# Patient Record
Sex: Female | Born: 1945 | Race: White | Hispanic: No | Smoking: Former smoker
Health system: Southern US, Community
[De-identification: ages and names within clinical notes are randomized; demographics above are authoritative.]

## PROBLEM LIST (undated history)

## (undated) DIAGNOSIS — R05 Cough: Secondary | ICD-10-CM

## (undated) DIAGNOSIS — J479 Bronchiectasis, uncomplicated: Secondary | ICD-10-CM

## (undated) DIAGNOSIS — R059 Cough, unspecified: Secondary | ICD-10-CM

## (undated) DIAGNOSIS — J309 Allergic rhinitis, unspecified: Secondary | ICD-10-CM

## (undated) DIAGNOSIS — J329 Chronic sinusitis, unspecified: Secondary | ICD-10-CM

## (undated) HISTORY — PX: APPENDECTOMY: SHX54

## (undated) HISTORY — DX: Allergic rhinitis, unspecified: J30.9

## (undated) HISTORY — PX: CHOLECYSTECTOMY: SHX55

## (undated) HISTORY — PX: TUBAL LIGATION: SHX77

## (undated) HISTORY — DX: Cough, unspecified: R05.9

## (undated) HISTORY — PX: VESICOVAGINAL FISTULA CLOSURE W/ TAH: SUR271

## (undated) HISTORY — DX: Cough: R05

## (undated) HISTORY — DX: Bronchiectasis, uncomplicated: J47.9

## (undated) HISTORY — DX: Chronic sinusitis, unspecified: J32.9

---

## 1998-03-26 ENCOUNTER — Other Ambulatory Visit: Admission: RE | Admit: 1998-03-26 | Discharge: 1998-03-26 | Payer: Self-pay | Admitting: Gynecology

## 2000-07-05 ENCOUNTER — Other Ambulatory Visit: Admission: RE | Admit: 2000-07-05 | Discharge: 2000-07-05 | Payer: Self-pay | Admitting: Gynecology

## 2002-12-21 ENCOUNTER — Other Ambulatory Visit: Admission: RE | Admit: 2002-12-21 | Discharge: 2002-12-21 | Payer: Self-pay | Admitting: Gynecology

## 2003-03-22 ENCOUNTER — Other Ambulatory Visit: Admission: RE | Admit: 2003-03-22 | Discharge: 2003-03-22 | Payer: Self-pay | Admitting: Gynecology

## 2003-10-02 ENCOUNTER — Other Ambulatory Visit: Admission: RE | Admit: 2003-10-02 | Discharge: 2003-10-02 | Payer: Self-pay | Admitting: Gynecology

## 2003-12-25 ENCOUNTER — Other Ambulatory Visit: Admission: RE | Admit: 2003-12-25 | Discharge: 2003-12-25 | Payer: Self-pay | Admitting: Gynecology

## 2004-06-26 ENCOUNTER — Other Ambulatory Visit: Admission: RE | Admit: 2004-06-26 | Discharge: 2004-06-26 | Payer: Self-pay | Admitting: Gynecology

## 2007-01-04 ENCOUNTER — Ambulatory Visit (HOSPITAL_COMMUNITY): Admission: RE | Admit: 2007-01-04 | Discharge: 2007-01-05 | Payer: Self-pay | Admitting: Obstetrics and Gynecology

## 2009-05-03 ENCOUNTER — Ambulatory Visit: Payer: Self-pay | Admitting: Pulmonary Disease

## 2009-05-03 DIAGNOSIS — R05 Cough: Secondary | ICD-10-CM

## 2009-05-03 DIAGNOSIS — R930 Abnormal findings on diagnostic imaging of skull and head, not elsewhere classified: Secondary | ICD-10-CM

## 2009-05-03 DIAGNOSIS — J309 Allergic rhinitis, unspecified: Secondary | ICD-10-CM

## 2009-05-16 ENCOUNTER — Telehealth: Payer: Self-pay | Admitting: Pulmonary Disease

## 2009-05-23 ENCOUNTER — Ambulatory Visit: Payer: Self-pay | Admitting: Pulmonary Disease

## 2009-05-23 DIAGNOSIS — J479 Bronchiectasis, uncomplicated: Secondary | ICD-10-CM | POA: Insufficient documentation

## 2009-06-10 ENCOUNTER — Telehealth: Payer: Self-pay | Admitting: Pulmonary Disease

## 2009-07-03 ENCOUNTER — Telehealth: Payer: Self-pay | Admitting: Pulmonary Disease

## 2009-07-08 ENCOUNTER — Ambulatory Visit: Payer: Self-pay | Admitting: Pulmonary Disease

## 2009-07-09 ENCOUNTER — Encounter: Payer: Self-pay | Admitting: Pulmonary Disease

## 2009-07-10 ENCOUNTER — Telehealth (INDEPENDENT_AMBULATORY_CARE_PROVIDER_SITE_OTHER): Payer: Self-pay | Admitting: *Deleted

## 2009-07-15 ENCOUNTER — Telehealth (INDEPENDENT_AMBULATORY_CARE_PROVIDER_SITE_OTHER): Payer: Self-pay | Admitting: *Deleted

## 2009-07-17 ENCOUNTER — Ambulatory Visit: Payer: Self-pay | Admitting: Pulmonary Disease

## 2009-07-26 ENCOUNTER — Telehealth (INDEPENDENT_AMBULATORY_CARE_PROVIDER_SITE_OTHER): Payer: Self-pay | Admitting: *Deleted

## 2009-08-02 ENCOUNTER — Telehealth: Payer: Self-pay | Admitting: Pulmonary Disease

## 2009-08-06 ENCOUNTER — Ambulatory Visit: Payer: Self-pay | Admitting: Internal Medicine

## 2009-08-08 ENCOUNTER — Ambulatory Visit: Payer: Self-pay | Admitting: Pulmonary Disease

## 2009-08-09 ENCOUNTER — Telehealth: Payer: Self-pay | Admitting: Pulmonary Disease

## 2009-08-16 ENCOUNTER — Telehealth (INDEPENDENT_AMBULATORY_CARE_PROVIDER_SITE_OTHER): Payer: Self-pay | Admitting: *Deleted

## 2009-09-09 ENCOUNTER — Ambulatory Visit: Payer: Self-pay | Admitting: Pulmonary Disease

## 2009-10-25 ENCOUNTER — Encounter: Payer: Self-pay | Admitting: Pulmonary Disease

## 2009-12-20 ENCOUNTER — Ambulatory Visit: Payer: Self-pay | Admitting: Pulmonary Disease

## 2010-01-21 ENCOUNTER — Ambulatory Visit: Payer: Self-pay | Admitting: Pulmonary Disease

## 2010-01-21 ENCOUNTER — Telehealth (INDEPENDENT_AMBULATORY_CARE_PROVIDER_SITE_OTHER): Payer: Self-pay | Admitting: *Deleted

## 2010-01-21 DIAGNOSIS — J019 Acute sinusitis, unspecified: Secondary | ICD-10-CM

## 2010-01-22 ENCOUNTER — Ambulatory Visit: Payer: Self-pay | Admitting: Cardiology

## 2010-06-18 ENCOUNTER — Ambulatory Visit: Payer: Self-pay | Admitting: Pulmonary Disease

## 2010-11-10 ENCOUNTER — Telehealth (INDEPENDENT_AMBULATORY_CARE_PROVIDER_SITE_OTHER): Payer: Self-pay | Admitting: *Deleted

## 2010-11-10 ENCOUNTER — Ambulatory Visit: Payer: Self-pay | Admitting: Pulmonary Disease

## 2010-11-10 ENCOUNTER — Telehealth: Payer: Self-pay | Admitting: Pulmonary Disease

## 2010-11-24 ENCOUNTER — Ambulatory Visit: Payer: Self-pay | Admitting: Pulmonary Disease

## 2010-11-24 DIAGNOSIS — R042 Hemoptysis: Secondary | ICD-10-CM | POA: Insufficient documentation

## 2010-12-01 ENCOUNTER — Ambulatory Visit: Payer: Self-pay | Admitting: Internal Medicine

## 2010-12-01 ENCOUNTER — Encounter: Payer: Self-pay | Admitting: Pulmonary Disease

## 2010-12-04 ENCOUNTER — Encounter: Payer: Self-pay | Admitting: Pulmonary Disease

## 2010-12-17 ENCOUNTER — Ambulatory Visit
Admission: RE | Admit: 2010-12-17 | Discharge: 2010-12-17 | Payer: Self-pay | Source: Home / Self Care | Attending: Pulmonary Disease | Admitting: Pulmonary Disease

## 2011-01-12 ENCOUNTER — Telehealth: Payer: Self-pay | Admitting: Pulmonary Disease

## 2011-01-13 NOTE — Progress Notes (Signed)
Summary: call in cipro now please  Phone Note Call from Patient   Caller: Patient Call For: clance Summary of Call: pt "just" saw kc. she wants [facility] [phone] as[facility] so it will be ready for pick up. (pt is at [facility] right now) Initial call taken by: Tivis Ringer, CNA,  January 21, 2010 10:50 AM  Follow-up for Phone Call        rx called into pharmacy.  Pt aware.  Aundra Millet Reynolds LPN  January 21, 2010 11:05 AM

## 2011-01-13 NOTE — Assessment & Plan Note (Signed)
Summary: rov for bronchiectasis/mac colonization   Primary Provider/Referring Provider:  Merrily Pew ([place])  CC:  6 month followup on bronchiectasis with CXR. Pt states that overall she has been doing well.  She has noticed some increased cough x 1 wk with white sputum.  She relates this to the humidity.  She states that today she was exposed to fumes in a new home and felt out of breath..  History of Present Illness: The pt comes in today for f/u of her known bronchiectasis.  She was last seen in Feb of this year for an episode of acute sinusitis, verified by ct scan.  She was treated with abx, and referred to ENT for evaluation.  She did well with the abx treatment, and although I have not received the ENT note, she tells me her exam was clear after the abx regimen.  Overall, she feels that she is doing well.  Minimal cough, no congestion or mucus of significance, and no sob.    Current Medications (verified): 1)  Estrace 1 Mg Tabs (Estradiol) .... Take 1 Tablet By Mouth Once A Day 2)  Calcium-Vitamin D3 600-200 Mg-Unit Tabs (Calcium Carbonate-Vitamin D) .... Two Times A Day 3)  Flax Seed Oil 1000 Mg Caps (Flaxseed (Linseed)) .... 1300mg  Once Daily 4)  Vitamin C 1000 Mg Tabs (Ascorbic Acid) .... Take 1 Tablet By Mouth Once A Day 5)  Lysine Acetate 500 Mg Tabs (Lysine Acetate) .Marland Kitchen.. 1 Once Daily 6)  Omeprazole 20 Mg Cpdr (Omeprazole) .... Take 1 Capsule By Mouth Two Times A Day 7)  Valacyclovir Hcl 500 Mg Tabs (Valacyclovir Hcl) .... Take 1 Tablet By Mouth Once A Day 8)  Alprazolam 0.25 Mg Tabs (Alprazolam) .... As Needed 9)  Fluorometholone 0.1 % Susp (Fluorometholone) .... As Directed 10)  Vitamin B-12 2500 Mcg Subl (Cyanocobalamin) .... Once Daily 11)  Chromium Picolinate 500 Mcg Tabs (Chromium Picolinate) .... 2 Once Daily  Allergies (verified): No Known Drug Allergies  Review of Systems       The patient complains of non-productive cough.  The patient denies shortness of breath  with activity, coughing up blood, chest pain, irregular heartbeats, acid heartburn, indigestion, loss of appetite, weight change, abdominal pain, difficulty swallowing, sore throat, tooth/dental problems, headaches, nasal congestion/difficulty breathing through nose, sneezing, itching, ear ache, anxiety, depression, hand/feet swelling, joint stiffness or pain, rash, change in color of mucus, fever, shortness of breath at rest, and productive cough.    Vital Signs:  Patient profile:   65 year old female Weight:      137 pounds O2 Sat:      96 % on Room air Temp:     98.1 degrees F oral Pulse rate:   92 / minute BP sitting:   110 / 70  (left arm)  Vitals Entered By: Vernie Murders (June 18, 2010 3:18 PM)  O2 Flow:  Room air  Physical Exam  General:  wd female in nad Nose:  no drainage noted or discharge Lungs:  totally clear to auscultation no wheezing or crackles. Heart:  rrr, no mrg Extremities:  no edema or cyanosis Neurologic:  alert and oriented, moves all 4.   Impression & Recommendations:  Problem # 1:  BRONCHIECTASIS (ICD-494.0) the pt is doing well from a bronchiectasis standpoint.  She has had no recent acute exacerbations, and feels that she is breathing well with adequate exercise tolerance.  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  No new interventions required provided cxr is stable.  Medications Added to Medication List This Visit: 1)  Lysine Acetate 500 Mg Tabs (Lysine acetate) .Marland Kitchen.. 1 once daily 2)  Chromium Picolinate 500 Mcg Tabs (Chromium picolinate) .... 2 once daily  Other Orders: Est. Patient Level III (65784) T-2 View CXR (71020TC)  Patient Instructions: 1)  no change in medications 2)  followup with me in 6mos or sooner if having flareup.  Prescriptions: OMEPRAZOLE 20 MG CPDR (OMEPRAZOLE) Take 1 capsule by mouth two times a day  #180 x 3   Entered and Authorized by:   Barbaraann Share MD   Signed by:   Barbaraann Share MD on 06/18/2010   Method used:   Print then Give to Patient   RxID:   [id number] OMEPRAZOLE 20 MG CPDR (OMEPRAZOLE) Take 1 capsule by mouth two times a day  #60 x 6   Entered and Authorized by:   Barbaraann Share MD   Signed by:   Barbaraann Share MD on 06/18/2010   Method used:   Print then Give to Patient   RxID:   [id number]

## 2011-01-13 NOTE — Progress Notes (Signed)
Summary: appt vs. er  Phone Note Call from Patient Call back at [facility] Phone [phone]   Caller: Patient Call For: clance Reason for Call: Talk to Nurse Summary of Call: Patient calling back saying she does not want to go to the ER.  Pt is requesting that she keep 3:00pm appt. w/ TP. Initial call taken by: Lehman Prom,  November 10, 2010 11:46 AM  Follow-up for Phone Call        Pt is going to keep OV today with TP  @ 3pm for hemoptysis.Michel Bickers [facility]  November 10, 2010 12:06 PM

## 2011-01-13 NOTE — Progress Notes (Signed)
Summary: HEMOPTYSIS  Phone Note Call from Patient Call back at Home Phone [phone]   Caller: Patient Call For: Candee Hoon Summary of Call: Coughing up blood today, states KC told her to call when this happens pls advise.//[facility] [place] Initial call taken by: Darletta Moll,  November 10, 2010 11:02 AM  Follow-up for Phone Call        Called, spoke with pt.  She c/o coughing up "pure red blood" x 30 minutes.  Blood was bright red and liquid.  States with each episode she coughed up approx 1tsp - 1tbsp of blood.  Pt states hemoptysis has stoped.  Denies SOB, wheezing, chest tightness, f/c/s.   While talking with TP's nurse [id number] pt in today, pt called back stating hemoptysis has now restarted and having clots in blood now.  OV offered today at 3pm with TP as there are no openings in office sooner but pt would like to be seen sooner as she does not want to wait that long with hemoptysis.  Pt was advised to go to ER as there are no openings before 3pm -- she verbalized understanding and stated she will go to [facility] ER.  Pt requesting KC to be aware of this -- will forward message to him so he is aware pt going to [facility] ER for further eval. Follow-up by: Gweneth Dimitri RN,  November 10, 2010 11:38 AM  Additional Follow-up for Phone Call Additional follow up Details #1::        noted. Additional Follow-up by: Barbaraann Share MD,  November 10, 2010 12:02 PM

## 2011-01-13 NOTE — Assessment & Plan Note (Signed)
Summary: rov for bronchiectasis   Primary Provider/Referring Provider:  Merrily Pew ([place])  CC:  Pt here for follow up. Pt states was treated with Biaxcin and Cipro in December along with Prednisone taper by Dr. Tomasa Blase. Pt c/o productive cough with "blood" taste.  History of Present Illness: The pt comes in today for f/u of her known bronchiectasis with MAC colonization.  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  Since the last visit, she had been doing well until DEC of last year when she developed a sinus infection.  She was treated with an abx and prednisone, but only had partial relief.  She required treatment a few weeks later with cipro, with good response.  She currently feels that she is breathing well, and has minimal cough with scant mucus.  She feels a little nasal, and that she has postnasal drip.  Overall, cough is not an issue for her.  Current Medications (verified): 1)  Estrace 1 Mg Tabs (Estradiol) .... Take 1 Tablet By Mouth Once A Day 2)  Calcium 600 1500 Mg Tabs (Calcium Carbonate) .... Take 1 Tablet By Mouth Two Times A Day 3)  Flax Seed Oil 1000 Mg Caps (Flaxseed (Linseed)) .... 1300mg  Once Daily 4)  Vitamin C 1000 Mg Tabs (Ascorbic Acid) .... Take 1 Tablet By Mouth Once A Day 5)  L-Lysine 500 Mg Tabs (Lysine) .... Take 1 Tablet By Mouth Two Times A Day 6)  Omeprazole 20 Mg Cpdr (Omeprazole) .... Take 1 Capsule By Mouth Two Times A Day 7)  Valacyclovir Hcl 500 Mg Tabs (Valacyclovir Hcl) .... Take 1 Tablet By Mouth Once A Day 8)  Alprazolam 0.25 Mg Tabs (Alprazolam) .... As Needed 9)  Fluorometholone 0.1 % Susp (Fluorometholone) .... As Directed  Allergies (verified): No Known Drug Allergies  Review of Systems      See HPI  Vital Signs:  Patient profile:   65 year old female Height:      64 inches Weight:      143 pounds O2 Sat:      96 % on Room air Temp:     97.9 degrees F oral Pulse rate:   89 / minute BP sitting:   114 / 68   (left arm) Cuff size:   regular  Vitals Entered By: Zackery Barefoot CMA (December 20, 2009 3:30 PM)  O2 Flow:  Room air CC: Pt here for follow up. Pt states was treated with Biaxcin and Cipro in December along with Prednisone taper by Dr. Tomasa Blase. Pt c/o productive cough with "blood" taste Comments Medications reviewed with patient Zackery Barefoot [facility]  December 20, 2009 3:33 PM    Physical Exam  General:  wd female in nad Nose:  no drainage or purulence noted. Lungs:  totally clear. surprisingly no crackles. Heart:  rrr Extremities:  no edema Neurologic:  alert and oriented, moves all 4.   Impression & Recommendations:  Problem # 1:  BRONCHIECTASIS (ICD-494.0) the pt seems to be doing well from this standpoint.  I suspect her recent flareup was due to sinusitis more so than her bronchiectasis.  She still has a nasal voice and postnasal drip.  I have asked her to try neilmed sinus rinses.  If she continues to have sinus issues, or if she has another acute flare in a short period of time, would check ct sinuses.  Problem # 2:  COUGH (ICD-786.2)  primarily due to LPR, and much improved on  PPI.  Medications Added to Medication List This Visit: 1)  Calcium 600 1500 Mg Tabs (Calcium carbonate) .... Take 1 tablet by mouth two times a day 2)  Flax Seed Oil 1000 Mg Caps (Flaxseed (linseed)) .... 1300mg  once daily 3)  Vitamin C 1000 Mg Tabs (Ascorbic acid) .... Take 1 tablet by mouth once a day 4)  Omeprazole 20 Mg Cpdr (Omeprazole) .... Take 1 capsule by mouth two times a day 5)  Alprazolam 0.25 Mg Tabs (Alprazolam) .... As needed 6)  Fluorometholone 0.1 % Susp (Fluorometholone) .... As directed  Other Orders: Est. Patient Level III (50093)  Patient Instructions: 1)  will try neilmed sinus rinses am and pm whenever you feel "nasally", or getting congested. 2)  stay on medication for reflux. 3)  followup with me in 6mos with cxr next visit.   Prescriptions: OMEPRAZOLE 20 MG  CPDR (OMEPRAZOLE) Take 1 capsule by mouth two times a day  #60 x 6   Entered and Authorized by:   Barbaraann Share MD   Signed by:   Barbaraann Share MD on 12/20/2009   Method used:   Print then Give to Patient   RxID:   [id number]

## 2011-01-13 NOTE — Assessment & Plan Note (Signed)
Summary: acute sick visit for cough, sinusitis   Copy to:    Primary Provider/Referring Provider:  Merrily Pew ([place])  CC:  Acute Visit.  c/o head congestion, productive cough with "off white" mucus, increase SOB with activity, fatigued, and hoarseness x1 wk.  Was seen by PCP on 01/13/10 for these symptoms, and was given azithromycin-finished it 2/5 and still no better. denies fever.Marland Kitchen  History of Present Illness: The pt comes in today for an acute sick visit related to cough and congestion.  She has known bronchiectasis with MAC colonization, as well as mild chronic cough that is more upper airway than anything else.  She gives a 2 week h/o head congestion with postnasal drip and cough productive of white mucus.  She has been hoarse, and notes some increase in doe.  She denies any chest congestion.  She was treated with a zpak by her primary md, and feels that she is no better.  She denies f/c/s.  Current Medications (verified): 1)  Estrace 1 Mg Tabs (Estradiol) .... Take 1 Tablet By Mouth Once A Day 2)  Calcium-Vitamin D3 600-200 Mg-Unit Tabs (Calcium Carbonate-Vitamin D) .... Two Times A Day 3)  Flax Seed Oil 1000 Mg Caps (Flaxseed (Linseed)) .... 1300mg  Once Daily 4)  Vitamin C 1000 Mg Tabs (Ascorbic Acid) .... Take 1 Tablet By Mouth Once A Day 5)  Lysine Hcl 1000 Mg Tabs (Lysine Hcl) .... Once Daily 6)  Omeprazole 20 Mg Cpdr (Omeprazole) .... Take 1 Capsule By Mouth Two Times A Day 7)  Valacyclovir Hcl 500 Mg Tabs (Valacyclovir Hcl) .... Take 1 Tablet By Mouth Once A Day 8)  Alprazolam 0.25 Mg Tabs (Alprazolam) .... As Needed 9)  Fluorometholone 0.1 % Susp (Fluorometholone) .... As Directed 10)  Vitamin B-12 2500 Mcg Subl (Cyanocobalamin) .... Once Daily  Allergies (verified): No Known Drug Allergies  Review of Systems       The patient complains of shortness of breath with activity, productive cough, non-productive cough, sore throat, headaches, nasal congestion/difficulty  breathing through nose, and change in color of mucus.  The patient denies shortness of breath at rest, coughing up blood, chest pain, irregular heartbeats, acid heartburn, indigestion, loss of appetite, weight change, abdominal pain, difficulty swallowing, tooth/dental problems, sneezing, itching, ear ache, anxiety, depression, hand/feet swelling, joint stiffness or pain, rash, and fever.    Vital Signs:  Patient profile:   65 year old female Height:      64 inches Weight:      140 pounds BMI:     24.12 O2 Sat:      94 % on Room air Temp:     98.1 degrees F oral Pulse rate:   77 / minute BP sitting:   112 / 70  (left arm) Cuff size:   regular  Vitals Entered By: Gweneth Dimitri RN (January 21, 2010 10:20 AM)  O2 Flow:  Room air CC: Acute Visit.  c/o head congestion, productive cough with "off white" mucus, increase SOB with activity, fatigued, and hoarseness x1 wk.  Was seen by PCP on 01/13/10 for these symptoms, was given azithromycin-finished it 2/5 and still no better. denies fever. Comments Medications reviewed with patient Daytime contact number verified with patient. Crystal Jones RN  January 21, 2010 10:20 AM    Physical Exam  General:  wd female in nad Nose:  mild erythema, no purulence noted Mouth:  clear Lungs:  totally clear to ascultation. Heart:  rrr Extremities:  no edema or cyanosis  Neurologic:  alert and oriented, moves all 4.   Impression & Recommendations:  Problem # 1:  SINUSITIS, ACUTE (ICD-461.9) The pt's symptoms are most c/w sinusitis rather than a lower airway infection.  I am concerned she may have chronic disease given the recurrent nature of her symptoms, and requirement for multiple rounds of abx in order to improve.  She has underlying bronchiectasis, and these patients typically have concomitant chronic sinus disease.  I would like to image her sinuses, and if signficantly abnormal, would like her to establish with ENT in light of ongoing nature of  her symptoms and underlying bronchiectasis.  She has seen an ENT in [place], but would like to be referred to one in [place].  Will go ahead and treat with abx, nasal hygeine regimen, and make the referral.  Medications Added to Medication List This Visit: 1)  Calcium-vitamin D3 600-200 Mg-unit Tabs (Calcium carbonate-vitamin d) .... Two times a day 2)  Lysine Hcl 1000 Mg Tabs (Lysine hcl) .... Once daily 3)  Vitamin B-12 2500 Mcg Subl (Cyanocobalamin) .... Once daily 4)  Cipro 500 Mg Tabs (Ciprofloxacin hcl) .... Take one tablet po twice a day  Other Orders: Est. Patient Level IV (16109) Radiology Referral (Radiology)  Patient Instructions: 1)  will treat with cipro for the next 7 days.  May have to extend this if xray shows extensive sinusitis. 2)  will arrange for imaging of your sinuses, and will call you with results. 3)  use neilmed sinus rinses am and pm until better. 4)  Use afrin 2 sprays each nostril followed by nasonex 2 sprays each nostril for the next 4 days only, then stop (use after rinsing with neil med)   Prescriptions: CIPRO 500 MG  TABS (CIPROFLOXACIN HCL) Take one tablet po twice a day  #14 x 0   Entered and Authorized by:   Barbaraann Share MD   Signed by:   Barbaraann Share MD on 01/21/2010   Method used:   Print then Give to Patient   RxID:   [id number]

## 2011-01-13 NOTE — Assessment & Plan Note (Signed)
Summary: Acute NP office visit - hemoptysis   Copy to:  Self Referral Primary Provider/Referring Provider:  Merrily Pew ([place])  CC:  coughing up "pure red blood" onset this morning - blood was bright red and liquid, but since become clotted.  States with each episode she coughed up approx 1tsp of blood.  Denies SOB, wheezing, and chest tightness.  History of Present Illness: 65 yo with known hx of bronchiectasis.     November 10, 2010--Presents for an acute office visit. Complains over last 2 weeks has had a sinus infection, tx by PCP w/ ABX ? name. Sinus are some better but she has started coughing. This am coughing up "pure red blood" onset this morning - blood was bright red and liquid, but since become clotted x 1 episode.  States with each episode she coughed up approx 1tsp of blood.  Denies SOB, wheezing, chest tightness, vomitting. Xray today in the office shows no acute process. She has recently started on aspirin 1 week ago. Denies chest pain, dyspnea, orthopnea, fever, n/v/d, edema, headache.   Medications Prior to Update: 1)  Estrace 1 Mg Tabs (Estradiol) .... Take 1 Tablet By Mouth Once A Day 2)  Calcium-Vitamin D3 600-200 Mg-Unit Tabs (Calcium Carbonate-Vitamin D) .... Two Times A Day 3)  Flax Seed Oil 1000 Mg Caps (Flaxseed (Linseed)) .... 1300mg  Once Daily 4)  Vitamin C 1000 Mg Tabs (Ascorbic Acid) .... Take 1 Tablet By Mouth Once A Day 5)  Lysine Acetate 500 Mg Tabs (Lysine Acetate) .Marland Kitchen.. 1 Once Daily 6)  Omeprazole 20 Mg Cpdr (Omeprazole) .... Take 1 Capsule By Mouth Two Times A Day 7)  Valacyclovir Hcl 500 Mg Tabs (Valacyclovir Hcl) .... Take 1 Tablet By Mouth Once A Day 8)  Alprazolam 0.25 Mg Tabs (Alprazolam) .... As Needed 9)  Fluorometholone 0.1 % Susp (Fluorometholone) .... As Directed 10)  Vitamin B-12 2500 Mcg Subl (Cyanocobalamin) .... Once Daily 11)  Chromium Picolinate 500 Mcg Tabs (Chromium Picolinate) .... 2 Once Daily  Current Medications (verified): 1)   Estrace 1 Mg Tabs (Estradiol) .... Take 1 Tablet By Mouth Once A Day 2)  Calcium-Vitamin D3 600-200 Mg-Unit Tabs (Calcium Carbonate-Vitamin D) .... Two Times A Day 3)  Flax Seed Oil 1000 Mg Caps (Flaxseed (Linseed)) .... 1300mg  Once Daily 4)  Vitamin C 1000 Mg Tabs (Ascorbic Acid) .... Take 1 Tablet By Mouth Once A Day 5)  Lysine Acetate 500 Mg Tabs (Lysine Acetate) .... 2 Tabs By Mouth Once Daily 6)  Omeprazole 20 Mg Cpdr (Omeprazole) .... Take 1 Capsule By Mouth Two Times A Day 7)  Acyclovir 400 Mg Tabs (Acyclovir) .... 2 Tabs By Mouth Once Daily 8)  Alprazolam 0.25 Mg Tabs (Alprazolam) .... As Needed 9)  Fluorometholone 0.1 % Susp (Fluorometholone) .... As Directed 10)  Vitamin B-12 2500 Mcg Subl (Cyanocobalamin) .... Once Daily  Allergies (verified): No Known Drug Allergies  Past History:  Past Surgical History: Last updated: 05/03/2009 s/p cholecystectomy, appendectomy s/p hyster. and tubal ligation.  Family History: Last updated: 05/03/2009 emphysema: sister clotting disorders: mother (stroke)   Social History: Last updated: 05/03/2009 Patient states former smoker. started at age 39.  less than 1 ppd.  quit 1987. pt is single. pt does have children. pt works as a Veterinary surgeon.    Risk Factors: Smoking Status: quit (07/17/2009) Packs/Day: less than 1 ppd (05/03/2009)  Past Medical History: SINUSITIS BRONCHIECTASIS (ICD-494.0) COUGH (ICD-786.2) ALLERGIC RHINITIS (ICD-477.9)    Review of Systems      See  HPI  Vital Signs:  Patient profile:   65 year old female Height:      64 inches Weight:      138.25 pounds BMI:     23.82 O2 Sat:      98 % on Room air Temp:     98.3 degrees F oral Pulse rate:   78 / minute BP sitting:   108 / 66  (left arm) Cuff size:   regular  Vitals Entered By: Boone Master CNA/MA (November 10, 2010 3:37 PM)  O2 Flow:  Room air CC: coughing up "pure red blood" onset this morning - blood was bright red and liquid, but since become  clotted.  States with each episode she coughed up approx 1tsp of blood.  Denies SOB, wheezing, chest tightness Is Patient Diabetic? No Comments Medications reviewed with patient Daytime contact number verified with patient. Boone Master CNA/MA  November 10, 2010 3:37 PM    Physical Exam  Additional Exam:  GEN: A/Ox3; pleasant , NAD HEENT:  [place]/AT, , EACs-clear, TMs-wnl, NOSE-clear drainage, THROAT-clear NECK:  Supple w/ fair ROM; no JVD; normal carotid impulses w/o bruits; no thyromegaly or nodules palpated; no lymphadenopathy. RESP  Coarse BS w/ no wheezing  CARD:  RRR, no m/r/g   GI:   Soft & nt; nml bowel sounds; no organomegaly or masses detected. [place]: Warm bil,  no calf tenderness edema, clubbing, pulses intact Neuro: intact w/ no focal deficits noted    Impression & Recommendations:  Problem # 1:  BRONCHIECTASIS (ICD-494.0)  Exacerbation w/ associated hemopytsis  we discussed if hemoptysis does not resolve or worsens she is to call us back for sooner follow up or go to ER.  will hold asa for now. discussed abx options, she refuses several options including avelox/levaquin , says she will not get  unless it is a generic.  Plan:  Hold Aspirin.  Hold flax seed oil for 2 weeks, then restart but place in freezer  Cipro 500mg  two times a day for 10 days Mucinex DM two times a day as needed cough/congesiton  follow up Dr. Shelle Iron in 2 weeks  Please contact office for sooner follow up if symptoms do not improve or worsen  Her updated medication list for this problem includes:    Ciprofloxacin Hcl 500 Mg Tabs (Ciprofloxacin hcl) .Marland Kitchen... 1 by mouth two times a day  Orders: T-2 View CXR (71020TC) Est. Patient Level IV (34742)  Medications Added to Medication List This Visit: 1)  Lysine Acetate 500 Mg Tabs (Lysine acetate) .... 2 tabs by mouth once daily 2)  Acyclovir 400 Mg Tabs (Acyclovir) .... 2 tabs by mouth once daily 3)  Ciprofloxacin Hcl 500 Mg Tabs (Ciprofloxacin hcl)  .Marland Kitchen.. 1 by mouth two times a day  Complete Medication List: 1)  Estrace 1 Mg Tabs (Estradiol) .... Take 1 tablet by mouth once a day 2)  Calcium-vitamin D3 600-200 Mg-unit Tabs (Calcium carbonate-vitamin d) .... Two times a day 3)  Flax Seed Oil 1000 Mg Caps (Flaxseed (linseed)) .... 1300mg  once daily 4)  Vitamin C 1000 Mg Tabs (Ascorbic acid) .... Take 1 tablet by mouth once a day 5)  Lysine Acetate 500 Mg Tabs (Lysine acetate) .... 2 tabs by mouth once daily 6)  Omeprazole 20 Mg Cpdr (Omeprazole) .... Take 1 capsule by mouth two times a day 7)  Acyclovir 400 Mg Tabs (Acyclovir) .... 2 tabs by mouth once daily 8)  Alprazolam 0.25 Mg Tabs (Alprazolam) .... As needed 9)  Fluorometholone  0.1 % Susp (Fluorometholone) .... As directed 10)  Vitamin B-12 2500 Mcg Subl (Cyanocobalamin) .... Once daily 11)  Ciprofloxacin Hcl 500 Mg Tabs (Ciprofloxacin hcl) .Marland Kitchen.. 1 by mouth two times a day  Patient Instructions: 1)  Hold Aspirin.  2)  Hold flax seed oil for 2 weeks, then restart but place in freezer  3)  Cipro 500mg  two times a day for 10 days 4)  Mucinex DM two times a day as needed cough/congesiton  5)  follow up Dr. Shelle Iron in 2 weeks  6)  Please contact office for sooner follow up if symptoms do not improve or worsen  Prescriptions: CIPROFLOXACIN HCL 500 MG TABS (CIPROFLOXACIN HCL) 1 by mouth two times a day  #20 x 0   Entered and Authorized by:   Rubye Oaks NP   Signed by:   Tammy Parrett NP on 11/10/2010   Method used:   Electronically to        [facility]  [address] [phone]* (retail)       [address]       [place], [place]  [address]       Ph: [phone] or [phone]       Fax: [phone]   RxID:   [id number]    Immunization History:  Influenza Immunization History:    Influenza:  historical (08/14/2010)

## 2011-01-15 NOTE — Assessment & Plan Note (Signed)
Summary: rov for bronchiectasis, hemoptysis   Visit Type:  Follow-up Primary Provider/Referring Provider:  Merrily Pew ([place])  CC:  6 month follow up. pt states she has no been coughing up any blood. pt states her breathing has been doing "fine'. .  History of Present Illness: the pt comes in today for f/u of her known bronchiectasis.  She has had issues with hemoptysis, but was during a bronchiectasis flare.  She has done better after a course of abx.  She has had a ct chest and ENT eval with no obvious new source of bleeding.  She feels she is doing well, with no sob or chest congestion.    Current Medications (verified): 1)  Estrace 1 Mg Tabs (Estradiol) .... Take 1 Tablet By Mouth Once A Day 2)  Calcium-Vitamin D3 600-200 Mg-Unit Tabs (Calcium Carbonate-Vitamin D) .... Two Times A Day 3)  Vitamin C 1000 Mg Tabs (Ascorbic Acid) .... Take 1 Tablet By Mouth Once A Day 4)  Lysine Acetate 500 Mg Tabs (Lysine Acetate) .... 2 Tabs By Mouth Once Daily 5)  Acyclovir 400 Mg Tabs (Acyclovir) .... 2 Tabs By Mouth Once Daily 6)  Alprazolam 0.25 Mg Tabs (Alprazolam) .... As Needed 7)  Fluorometholone 0.1 % Susp (Fluorometholone) .... As Directed 8)  Vitamin B-12 2500 Mcg Subl (Cyanocobalamin) .... Once Daily 9)  Omeprazole 40 Mg Cpdr (Omeprazole) .... One Each Day 10)  Flonase 50 Mcg/act Susp (Fluticasone Propionate) .... As Needed  Allergies (verified): No Known Drug Allergies  Review of Systems       The patient complains of nasal congestion/difficulty breathing through nose and hand/feet swelling.  The patient denies shortness of breath with activity, shortness of breath at rest, productive cough, non-productive cough, coughing up blood, chest pain, irregular heartbeats, acid heartburn, indigestion, loss of appetite, weight change, abdominal pain, difficulty swallowing, sore throat, tooth/dental problems, headaches, sneezing, itching, ear ache, anxiety, depression, joint stiffness or pain,  rash, change in color of mucus, and fever.    Vital Signs:  Patient profile:   65 year old female Height:      64 inches Weight:      142.13 pounds BMI:     24.48 O2 Sat:      97 % on Room air Temp:     98.0 degrees F oral Pulse rate:   99 / minute BP sitting:   124 / 74  (left arm) Cuff size:   regular  Vitals Entered By: Carver Fila (December 17, 2010 2:54 PM)  O2 Flow:  Room air CC: 6 month follow up. pt states she has no been coughing up any blood. pt states her breathing has been doing "fine'.  Comments meds and allergies updated Phone number updated Carver Fila  December 17, 2010 2:55 PM    Physical Exam  General:  wd female in nad Nose:  no purulence or discharge noted. Lungs:  totally clear, with no wheezing or rhonchi Heart:  rrr Extremities:  no edema or cyanosis  Neurologic:  alert and oriented, moves all 4.   Impression & Recommendations:  Problem # 1:  BRONCHIECTASIS (ICD-494.0) the pt is currently back to her usual baseline, with no further hemoptysis or infection.    Problem # 2:  HEMOPTYSIS UNSPECIFIED (ICD-786.30)  The pt has not had any further hemoptysis.  She has had ENT eval with no obvious source found.  She has had a chest ct with no change from prior.  I suspect this is from  her bronchiectasis, and will just follow for now.  She is satisfied with this, and will call if recurs.  Medications Added to Medication List This Visit: 1)  Flonase 50 Mcg/act Susp (Fluticasone propionate) .... As needed  Other Orders: Est. Patient Level III (74259)  Patient Instructions: 1)  no change in treatment 2)  please call if bleeding recurs 3)  followup with me in 6mos.

## 2011-01-15 NOTE — Miscellaneous (Signed)
Summary: Orders Update   Clinical Lists Changes  Orders: Added new Referral order of ENT Referral (ENT) - Signed 

## 2011-01-15 NOTE — Assessment & Plan Note (Signed)
Summary: rov for hemoptysis   Visit Type:  Follow-up Copy to:  Self Referral Primary Provider/Referring Provider:  Merrily Pew ([place])  CC:  2 week follow up. pt states friday night she coughed up blood 2-3 tablespoons. pt states her breathing is "okay". pt needs rx on omeprazole. Marland Kitchen  History of Present Illness: The pt comes in today for f/u of hemoptysis.  She recently had an episode of significant hemoptysis, and was felt secondary to a flare of her bronchiectasis.  She was seen by our NP and treated with cipro, and felt things did improve.  However, 3 days ago had another episode of BRB in significant quantity.  She believes it is from her chest, but is having no further chest congestion or sob.  She denies having any purulent mucus, fever, or chills.  Current Medications (verified): 1)  Estrace 1 Mg Tabs (Estradiol) .... Take 1 Tablet By Mouth Once A Day 2)  Calcium-Vitamin D3 600-200 Mg-Unit Tabs (Calcium Carbonate-Vitamin D) .... Two Times A Day 3)  Vitamin C 1000 Mg Tabs (Ascorbic Acid) .... Take 1 Tablet By Mouth Once A Day 4)  Lysine Acetate 500 Mg Tabs (Lysine Acetate) .... 2 Tabs By Mouth Once Daily 5)  Omeprazole 20 Mg Cpdr (Omeprazole) .... Take 1 Capsule By Mouth Two Times A Day 6)  Acyclovir 400 Mg Tabs (Acyclovir) .... 2 Tabs By Mouth Once Daily 7)  Alprazolam 0.25 Mg Tabs (Alprazolam) .... As Needed 8)  Fluorometholone 0.1 % Susp (Fluorometholone) .... As Directed 9)  Vitamin B-12 2500 Mcg Subl (Cyanocobalamin) .... Once Daily  Allergies (verified): No Known Drug Allergies  Review of Systems       The patient complains of productive cough, non-productive cough, coughing up blood, weight change, depression, and hand/feet swelling.  The patient denies shortness of breath with activity, shortness of breath at rest, chest pain, irregular heartbeats, acid heartburn, indigestion, loss of appetite, abdominal pain, difficulty swallowing, sore throat, tooth/dental problems,  headaches, nasal congestion/difficulty breathing through nose, sneezing, itching, ear ache, anxiety, joint stiffness or pain, rash, change in color of mucus, and fever.    Vital Signs:  Patient profile:   65 year old female Height:      64 inches Weight:      141.25 pounds BMI:     24.33 O2 Sat:      97 % on Room air Temp:     98.3 degrees F oral Pulse rate:   71 / minute BP sitting:   130 / 78  (left arm) Cuff size:   regular  Vitals Entered By: Carver Fila (November 24, 2010 11:17 AM)  O2 Flow:  Room air CC: 2 week follow up. pt states friday night she coughed up blood 2-3 tablespoons. pt states her breathing is "okay". pt needs rx on omeprazole.  Comments meds and allergies updated Phone number updated Carver Fila  November 24, 2010 11:17 AM    Physical Exam  General:  wd female in nad Nose:  no obvious purulence or blood. Lungs:  totally clear to auscultation, no wheezing or rhonchi Heart:  rrr, no mrg Extremities:  no edema or cyanosis  Neurologic:  alert and oriented, moves all 4.   Impression & Recommendations:  Problem # 1:  HEMOPTYSIS UNSPECIFIED (ICD-786.30) the pt has seen a significant decline in her hemoptysis since being treated with cipro, but had an episode a few days ago of BRB.  She has known bronchiectasis with MAC colonization, but also has chronic sinusitis.  I have explained to her it is common to see hemoptysis with a bronchiectasis flareup, but with persistent issues, would like to check a ct chest for other causes.  If the ct is without change from last year, would have ENT see her for possible upper airway source?  Medications Added to Medication List This Visit: 1)  Omeprazole 40 Mg Cpdr (Omeprazole) .... One each day  Other Orders: Est. Patient Level III (09811) Radiology Referral (Radiology)  Patient Instructions: 1)  will check ct scan of chest to see if there is a specific change that may be causing the bloody mucus.  Will call you with  results.   Prescriptions: OMEPRAZOLE 40 MG CPDR (OMEPRAZOLE) one each day  #90 x 4   Entered and Authorized by:   Barbaraann Share MD   Signed by:   Barbaraann Share MD on 11/24/2010   Method used:   Print then Give to Patient   RxID:   [id number]

## 2011-01-21 ENCOUNTER — Telehealth: Payer: Self-pay | Admitting: Pulmonary Disease

## 2011-01-21 NOTE — Letter (Signed)
Summary: Osborn Coho MD/[facility]  Osborn Coho MD/[facility]   Imported By: Lester Wabash 01/15/2011 09:51:02  _____________________________________________________________________  External Attachment:    Type:   Image     Comment:   External Document

## 2011-01-21 NOTE — Progress Notes (Signed)
Summary: hemoptysis  Phone Note Call from Patient Call back at Home Phone [phone]   Caller: Patient Call For: Letina Luckett Summary of Call: pt coughed up blood last night. it was mixed in her mucus- not sure how much but it was "bright red in color".  Initial call taken by: Tivis Ringer, CNA,  January 12, 2011 11:07 AM  Follow-up for Phone Call        Per last OV note from 12/17/10, pt was instructed to please call if bleeding recurs.  Called, spoke with pt.  States last night she had 1"minor" episode of hemoptysis that lasted approx 15 minutes.  Bright red in color with clots.  States since that episode, it has not reaccured.  Denies increased SOB, wheezing, chest tightness.   Wanted to inform [facility] of this and ? if this could be coming from esophagus.  KC, pls advise.  Thanks! Follow-up by: Gweneth Dimitri RN,  January 12, 2011 11:41 AM  Additional Follow-up for Phone Call Additional follow up Details #1::        discussed with pt again.  told it was scant blood tinged secretions.  offered to just followup a little longer to see how she does vs proceeding to FOB for airway exam.  she does have a past h/o smoking, but not severe.  ct chest nonlocalizing.   I suspect this is due to her bronchiectasis.   she will just follow for now, but will call if persists or recurs and will do FOB. Additional Follow-up by: Barbaraann Share MD,  January 12, 2011 5:35 PM

## 2011-01-28 ENCOUNTER — Encounter: Payer: Self-pay | Admitting: Pulmonary Disease

## 2011-01-28 ENCOUNTER — Other Ambulatory Visit: Payer: Self-pay | Admitting: Pulmonary Disease

## 2011-01-28 ENCOUNTER — Ambulatory Visit (HOSPITAL_COMMUNITY)
Admission: RE | Admit: 2011-01-28 | Discharge: 2011-01-28 | Disposition: A | Discharge status: Home / Self Care | Payer: Medicare Other | Source: Ambulatory Visit | Attending: Pulmonary Disease | Admitting: Pulmonary Disease

## 2011-01-28 DIAGNOSIS — J479 Bronchiectasis, uncomplicated: Secondary | ICD-10-CM | POA: Insufficient documentation

## 2011-01-28 DIAGNOSIS — R042 Hemoptysis: Secondary | ICD-10-CM

## 2011-01-29 NOTE — Progress Notes (Signed)
Summary: Coughing up blood----bronch scheduled  Phone Note Call from Patient Call back at Home Phone [phone]   Caller: Patient Summary of Call: Patient coughing up blood Monday 01/19/11 and today.  Was advised to call Dr. Shelle Iron if that happened again. Initial call taken by: Leonette Monarch,  January 21, 2011 2:17 PM  Follow-up for Phone Call        I spoke with patient-states that she coughed up red color clots of blood on Monday and again today-streaky red in color. Pt didnt sound to be in any distress or SOB while speaking with me on the phone. Pt aware that I am sending message to [facility] for recs.Reynaldo Minium CMA  January 21, 2011 2:34 PM   Additional Follow-up for Phone Call Additional follow up Details #1::        spoke with pt...will do airway exam to r/o endobr. process.  bronch scheduled for next wed.   called and spoke with pt.  pt is aware of bronch appt date and time (01-28-2011 at 7:30---pt needs to arrive at 6:15) pt was instructed NPO after midnight and to have someone come with her who can drive her home after the procedure.  pt verbalized understanding and denied any questions. i also mailed the bronch form with all of this info to pt's home address.  Aundra Millet Reynolds LPN  January 21, 2011 3:09 PM  Additional Follow-up by: Barbaraann Share MD,  January 21, 2011 3:03 PM

## 2011-01-30 LAB — CULTURE, RESPIRATORY W GRAM STAIN

## 2011-02-24 LAB — FUNGUS CULTURE W SMEAR: Fungal Smear: NONE SEEN

## 2011-03-02 LAB — AFB CULTURE WITH SMEAR (NOT AT ARMC)

## 2011-03-25 NOTE — Op Note (Signed)
  [name], Kiara Medina                  ACCOUNT NO.:  [id number]  MEDICAL RECORD NO.:  [id number]           PATIENT TYPE:  O  LOCATION:  RESP                         FACILITY:  [facility]  PHYSICIAN:  Barbaraann Share, MD,FCCPDATE OF BIRTH:  12/04/1944  DATE OF PROCEDURE:  01/28/2011 DATE OF DISCHARGE:                              OPERATIVE REPORT   PROCEDURE:  Flexible fiberoptic bronchoscopy with video.  OPERATOR:  Barbaraann Share, MD,FCCP  INDICATIONS FOR PROCEDURE:  Recurrent hemoptysis of unknown origin.  ANESTHESIA:  Demerol 50 mg IV, Versed 7.5 mg IV, and topical 1% lidocaine to vocal cords and airways during the procedure.  DESCRIPTION OF PROCEDURE:  After obtaining informed consent and under close cardiopulmonary monitoring, the above preop anesthesia was given and the fiberoptic scope was passed to the right naris and into the posterior pharynx where there were no lesions or other abnormalities seen.  There was no obvious source of bleeding upon very careful examination of the nasopharynx.  Vocal cords appeared to be within normal limits and moved bilaterally on phonation.  The scope was then passed into the trachea where it was examined along its entire length down to the level of the carina, all of which was normal.  The left and right tracheobronchial trees were examined serially to subsegmental level with no obvious endobronchial abnormalities seen.  Bronchoalveolar lavage was then done from the lingula, right middle lobe, and anterior segment of the right upper lobe where she has known bronchiectasis and sent for the usual cytologic and bacteriologic evaluations.  Overall, the patient tolerated the procedure well and there were no complications.  In summary, there was no obvious bleeding source noted.     Barbaraann Share, MD,FCCP     KMC/MEDQ  D:  01/28/2011  T:  01/28/2011  Job:  [id number]  Electronically Signed by Marcelyn Bruins MDFCCP on 03/25/2011 01:25:29 PM

## 2011-05-01 NOTE — Op Note (Signed)
NAMEMARRIE, Kiara Medina                  ACCOUNT NO.:  [id number]   MEDICAL RECORD NO.:  [id number]          PATIENT TYPE:  AMB   LOCATION:  SDC                           FACILITY:  [facility]   PHYSICIAN:  Randye Lobo, M.D.   DATE OF BIRTH:  [birth year]   DATE OF PROCEDURE:  01/04/2007  DATE OF DISCHARGE:                               OPERATIVE REPORT   PREOPERATIVE DIAGNOSIS:  Third degree cystocele, genuine stress  incontinence.   POSTOPERATIVE DIAGNOSIS:  Third degree cystocele, genuine stress  incontinence.   PROCEDURE:  Anterior colporrhaphy, tension-free vaginal tape suburethral  sling, cystoscopy.   SURGEON:  Conley Simmonds, M.D.   ASSISTANT:  Lodema Hong, M.D.   ANESTHESIA:  Is general endotracheal, local with half percent lidocaine  with 1:200,000 of epinephrine.   IV FLUIDS:  1700 mL Ringer's lactate.   ESTIMATED BLOOD LOSS:  150 mL   URINE OUTPUT:  450 mL   COMPLICATIONS:  None.   INDICATIONS FOR PROCEDURE:  The patient is a 65 year old para 2  Caucasian female, status post total vaginal hysterectomy with posterior  colporrhaphy in 1998, who presented to her primary gynecologist, Dr.  Lodema Hong with bladder protrusion and urinary leakage with coughing  and sneezing.  The patient also reported urge incontinence.  The patient  wished for surgical treatment of the above.  She underwent urodynamic  evaluation on December 20, 2006 and this documented genuine stress  incontinence with a leak point pressure of 70 cm of water.  The maximum  detrusor pressure with a pressure flow study was 14 cm of water and the  patient was noted to have a Valsalva voiding pattern.  In physical  examination, the patient was noted to have a first-degree cystocele in  the office and there was evidence of good posterior vaginal and apical  vaginal support.  A plan is made now to proceed with an anterior  colporrhaphy with a possible Xenform graft, tension-free vaginal tape  suburethral sling and  cystoscopy after risks, benefits, and alternatives  were discussed.   FINDINGS:  Examination under anesthesia revealed a third degree  cystocele.  The cervix and uterus were absent.  There was evidence of  good apical vaginal and posterior vaginal wall support.   Cystoscopy demonstrated the absence of a foreign body in the bladder or  the urethra.  The bladder was visualized throughout 360 degrees and had  a normal bladder dome and trigone.  There was a small polypoid strand of  urethral mucosa at the urethrovesical junction at the 6 o'clock position  and this measured approximately 4 mm in length. The ureters were noted  to be patent bilaterally after the injection of indigo carmine dye IV.   SPECIMENS:  None.   PROCEDURE:  The patient was reidentified in the preoperative hold area.  She did receive Ancef 1 gram IV for antibiotic prophylaxis.  The patient  received both TED hose and PAS stockings for DVT prophylaxis.   In the operating room, general endotracheal anesthesia was induced and  the patient was then placed in the dorsal lithotomy  position.  The lower  abdomen and vagina were sterilely prepped and draped and a Foley  catheter was placed inside the bladder.   An exam under anesthesia was performed and the findings were as noted  above.   The small weighted speculum was placed inside the vagina and the  anterior vaginal wall was marked in the midline with Allis clamps.  The  vaginal mucosa was then incised vertically with a scalpel.  A  combination of sharp and blunt dissection was used to dissect the  subvaginal tissue off the overlying vaginal mucosa bilaterally.  The  dissection was carried back to the level of the pubic rami bilaterally  without entering into the retropubic space.  The dissection was carried  back to the level of the vaginal apex bilaterally as well.  Hemostasis  was created during the dissection with monopolar cautery.   The suprapubic incision  sites were then marked approximately 2.5 cm to  the right and the left of the midline.  The incisions were created with  the scalpel.  The TVT procedure was performed in a top-down fashion.  The abdominal needle passer was placed first through the right  suprapubic pubic incision and then out through the endopelvic fascia  lateral to the mid urethra on the ipsilateral side.  The same procedure  that was performed and the right-hand side was then repeated on the left-  hand side.  The Foley catheter was removed and cystoscopy was performed  after the injection of indigo carmine dye.  The findings were as noted  above.   The sling was then attached to the abdominal needle passers and the  sling was drawn up through the suprapubic incisions bilaterally.  The  excess sling material was trimmed.  The sling was noted to be in good  position.  There was some bleeding noted from the vaginal exit site of  the sling on the patient's right-hand side and this responded to figure-  of-eight and simple sutures of 2-0 Vicryl.   The anterior colporrhaphy was performed with a series of vertical  mattress sutures of 2-0 Vicryl which reduced the cystocele entirely.  The mattress sutures were carried all the way down to the level of the  vaginal apex.  Excess vaginal mucosa was then trimmed.  A small piece of  Gelfoam was placed near the exit site of the sling on the patient's  right-hand side.  The anterior vaginal wall was then closed with a  running locked suture of 2-0 Vicryl.  A vaginal packing with Estrace  cream was then placed inside the vagina.  The suprapubic incisions were  closed with Dermabond.   This concluded the patient's procedure.  There were no complications.  All needle, instrument, sponge counts were correct.      Randye Lobo, M.D.  Electronically Signed     BES/MEDQ  D:  01/04/2007  T:  01/04/2007  Job:  [id number]

## 2011-06-01 ENCOUNTER — Encounter (HOSPITAL_COMMUNITY)
Admission: RE | Admit: 2011-06-01 | Discharge: 2011-06-01 | Disposition: A | Discharge status: Home / Self Care | Payer: Medicare Other | Source: Ambulatory Visit | Attending: Obstetrics and Gynecology | Admitting: Obstetrics and Gynecology

## 2011-06-01 LAB — PROTIME-INR
INR: 1.03 (ref 0.00–1.49)
Prothrombin Time: 13.7 seconds (ref 11.6–15.2)

## 2011-06-01 LAB — CBC
MCH: 30.5 pg (ref 26.0–34.0)
MCV: 94.4 fL (ref 78.0–100.0)
Platelets: 185 10*3/uL (ref 150–400)
RDW: 12.9 % (ref 11.5–15.5)
WBC: 6.3 10*3/uL (ref 4.0–10.5)

## 2011-06-01 LAB — COMPREHENSIVE METABOLIC PANEL
AST: 24 U/L (ref 0–37)
Albumin: 3.3 g/dL — ABNORMAL LOW (ref 3.5–5.2)
Calcium: 9.6 mg/dL (ref 8.4–10.5)
Creatinine, Ser: 0.59 mg/dL (ref 0.50–1.10)
Sodium: 136 mEq/L (ref 135–145)
Total Protein: 6.9 g/dL (ref 6.0–8.3)

## 2011-06-08 ENCOUNTER — Ambulatory Visit (HOSPITAL_COMMUNITY)
Admission: EM | Admit: 2011-06-08 | Discharge: 2011-06-09 | Disposition: A | Discharge status: Home / Self Care | Payer: Medicare Other | Source: Ambulatory Visit | Attending: Obstetrics and Gynecology | Admitting: Obstetrics and Gynecology

## 2011-06-08 DIAGNOSIS — Z01818 Encounter for other preprocedural examination: Secondary | ICD-10-CM | POA: Insufficient documentation

## 2011-06-08 DIAGNOSIS — N993 Prolapse of vaginal vault after hysterectomy: Secondary | ICD-10-CM | POA: Insufficient documentation

## 2011-06-09 LAB — CBC
HCT: 37.1 % (ref 36.0–46.0)
Hemoglobin: 12.2 g/dL (ref 12.0–15.0)
MCH: 30.7 pg (ref 26.0–34.0)
MCV: 93.5 fL (ref 78.0–100.0)
RBC: 3.97 MIL/uL (ref 3.87–5.11)

## 2011-06-15 ENCOUNTER — Ambulatory Visit: Payer: Self-pay | Admitting: Pulmonary Disease

## 2011-06-20 NOTE — Op Note (Signed)
[name], Medina NO.:  [id number]  MEDICAL RECORD NO.:  [id number]  LOCATION:  9316                          FACILITY:  [facility]  PHYSICIAN:  Randye Lobo, M.D.   DATE OF BIRTH:  02-Apr-1946  DATE OF PROCEDURE:  06/08/2011 DATE OF DISCHARGE:                              OPERATIVE REPORT   PREOPERATIVE DIAGNOSIS:  Incomplete vaginal prolapse.  POSTOPERATIVE DIAGNOSIS:  Incomplete vaginal prolapse.  PROCEDURE:  A posterior colporrhaphy with Xenform graft placement.  SURGEON:  Conley Simmonds, MD  ASSISTANT:  Lodema Hong, MD  ANESTHESIA:  General endotracheal, local with 1% lidocaine with epinephrine 1:100,000.  IV FLUIDS:  2000 mL Ringer lactate.  ESTIMATED BLOOD LOSS:  100 mL.  URINE OUTPUT:  350 mL.  COMPLICATIONS:  None.  INDICATIONS FOR PROCEDURE:  The patient is a 65 year old Caucasian female, status post total vaginal hysterectomy with posterior colporrhaphy in 1998 and status post anterior colporrhaphy and TVT sling with cystoscopy in 2008, who presents with a vaginal protrusion.  The patient is noted to have a third-degree rectocele in the office and the anterior vaginal wall and apex appeared to be well supported.  The patient is requesting surgical treatment.  A plan is made to proceed with a posterior colporrhaphy with possible Xenform graft placement and a possible sacrospinous fixation.  Risks, benefits, and alternatives have been reviewed with the patient who wishes to proceed.  FINDINGS:  Examination under anesthesia revealed a normal and well supported anterior vaginal wall and apex.  There was a third degree high rectocele which actually was also an enterocele.  SPECIMENS:  None.  PROCEDURE:  The patient was reidentified in the preoperative hold area. She received cefotetan IV for antibiotic prophylaxis.  She received TED hose and PAS stockings for DVT prophylaxis.  In the operating room, general endotracheal anesthesia was  induced and the patient was placed in the dorsal lithotomy position.  The lower abdomen, vagina, and perineum were sterilely prepped and draped.  The bladder was catheterized of urine with a Foley catheter.  The patient was examination under anesthesia and the findings are as noted above.  Allis clamps were then used to mark the posterior fourchette and the midline of the posterior vaginal mucosa extending all the way up to the apex.  This was performed by using Allis clamps.  The mucosa was injected locally with 1% lidocaine with epinephrine 1:100,000.  A triangular wedge of epithelium was excised from the perineal body and the vaginal mucosa was then incised vertically in the midline with a Metzenbaum scissors.  A combination of sharp and blunt dissection were used to dissect the perirectal fascia off of the overlying vaginal mucosa.  This was performed bilaterally.  Hemostasis was excellent.  A gloved digit was then placed inside the rectum and the enterocele was appreciated at this time.  The enterocele sac was not opened.  A running suture of 2-0 Vicryl was then used to reduce the enterocele and the rectocele and extending down to the level of the hymen.  This reduced the enterocele and rectocele well.  A piece of Xenform graft soaked in saline was then placed overlying the rectocele  repair.  The graft was attached laterally to the levator muscle using interrupted sutures of 0 Vicryl.  The graft was attached to the underside of the vaginal apex into the perineal body using interrupted sutures of 2-0 Vicryl.  There was excellent reduction of the rectocele with no evidence of any ridging.  Excess vaginal mucosa was trimmed at this time and the posterior vaginal mucosa was then closed with a running locked suture of 2-0 Vicryl down to a hymenal ring.  A crown stitch of 0 Vicryl was placed along the perineal body.  A subcuticular closure of the perineal body was performed and the knot  was tied at the hymen.  Final rectal exam confirmed the absence of suture and the rectum.  A gauze packing with Estrace was placed inside the vagina.  A Foley catheter was left to gravity drainage.  There were no complications to the procedure.  All needle, instrument, and sponge counts were correct.  The patient was escorted to the recovery room in stable and awake condition.     Randye Lobo, M.D.     BES/MEDQ  D:  06/08/2011  T:  06/09/2011  Job:  [id number]  Electronically Signed by Conley Simmonds M.D. on 06/20/2011 09:44:13 AM

## 2011-07-06 ENCOUNTER — Ambulatory Visit: Payer: Self-pay | Admitting: Pulmonary Disease

## 2011-07-14 ENCOUNTER — Ambulatory Visit: Payer: Self-pay | Admitting: Pulmonary Disease

## 2011-07-21 ENCOUNTER — Ambulatory Visit (INDEPENDENT_AMBULATORY_CARE_PROVIDER_SITE_OTHER): Payer: Medicare Other | Admitting: Pulmonary Disease

## 2011-07-21 ENCOUNTER — Encounter: Payer: Self-pay | Admitting: Pulmonary Disease

## 2011-07-21 VITALS — BP 120/76 | HR 93 | Temp 98.1°F | Ht 63.25 in | Wt 138.0 lb

## 2011-07-21 DIAGNOSIS — J479 Bronchiectasis, uncomplicated: Secondary | ICD-10-CM

## 2011-07-21 NOTE — Progress Notes (Signed)
  Subjective:    Patient ID: Kiara Medina, female    DOB: 04-11-1946, 65 y.o.   MRN: [id number]  HPI The pt comes in today for f/u of her known bronchiectasis with intermittent cough.  She has been doing very well, but last week had some increased congestion and cough with mild mucus production.  This week has improved, and she feels is resolving.  She has not had worsening sob.    Review of Systems  Constitutional: Negative for fever and unexpected weight change.  HENT: Positive for congestion. Negative for ear pain, nosebleeds, sore throat, rhinorrhea, sneezing, trouble swallowing, dental problem, postnasal drip and sinus pressure.   Eyes: Negative for redness and itching.  Respiratory: Positive for cough. Negative for chest tightness, shortness of breath and wheezing.   Cardiovascular: Negative for palpitations and leg swelling.  Gastrointestinal: Negative for nausea and vomiting.  Genitourinary: Negative for dysuria.  Musculoskeletal: Positive for joint swelling.  Skin: Negative for rash.  Neurological: Negative for headaches.  Hematological: Does not bruise/bleed easily.  Psychiatric/Behavioral: Negative for dysphoric mood. The patient is not nervous/anxious.        Objective:   Physical Exam Wd female in nad Nares without discharge or purulence Chest totally clear to auscultation, no wheezing Cor with rrr LE without edema, no cyanosis Alert, oriented, moves all 4.        Assessment & Plan:

## 2011-07-21 NOTE — Patient Instructions (Signed)
Can try mucinex extra strength one in am and pm only when having increased congestion and mucus.  Can stop when gets better. Please call if having worsening symptoms. followup with me in 6mos.

## 2011-07-25 NOTE — Assessment & Plan Note (Signed)
The pt is doing fairly well from a pulmonary standpoint.  She had increased cough with some increased mucus last week, but is improved this week.  She is not having any sob, and is staying very active.

## 2011-11-02 ENCOUNTER — Other Ambulatory Visit: Payer: Self-pay | Admitting: Pulmonary Disease

## 2012-01-20 ENCOUNTER — Ambulatory Visit (INDEPENDENT_AMBULATORY_CARE_PROVIDER_SITE_OTHER): Payer: Medicare Other | Admitting: Pulmonary Disease

## 2012-01-20 ENCOUNTER — Encounter: Payer: Self-pay | Admitting: Pulmonary Disease

## 2012-01-20 VITALS — BP 122/76 | HR 75 | Temp 98.0°F | Ht 63.25 in | Wt 137.2 lb

## 2012-01-20 DIAGNOSIS — J479 Bronchiectasis, uncomplicated: Secondary | ICD-10-CM

## 2012-01-20 NOTE — Assessment & Plan Note (Signed)
The patient is doing very well from a pulmonary perspective.  She has not had a recent bronchiectasis flare, feels that her breathing is normal.  She has had issues with an upper airway cough in the past, but this has settled down as well.  I have asked her to followup with me in one year, and we will check a chest x-ray at that visit.  She will call if she has worsening pulmonary symptoms.

## 2012-01-20 NOTE — Patient Instructions (Signed)
Continue with current medications followup with me in one year, but call if you develop worsening pulmonary symptoms.  Will check a cxr next visit.

## 2012-01-20 NOTE — Progress Notes (Signed)
  Subjective:    Patient ID: Kiara Medina, female    DOB: Sep 07, 1946, 66 y.o.   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  She has really been doing well since the last visit, and reports no breathing issues or escalation of cough.  She had one episode of sinusitis last year that was treated by her primary physician, and currently has developed a classic viral URI after taking care of her grandchildren.  She denies any shortness of breath or purulent mucus.   Review of Systems  Constitutional: Negative for fever and unexpected weight change.  HENT: Positive for congestion, sneezing and sinus pressure. Negative for ear pain, nosebleeds, sore throat, rhinorrhea, trouble swallowing, dental problem and postnasal drip.   Eyes: Negative for redness and itching.  Respiratory: Positive for cough. Negative for chest tightness, shortness of breath and wheezing.   Cardiovascular: Negative for palpitations and leg swelling.  Gastrointestinal: Negative for nausea and vomiting.  Genitourinary: Negative for dysuria.  Musculoskeletal: Negative for joint swelling.  Skin: Negative for rash.  Neurological: Positive for headaches.  Hematological: Does not bruise/bleed easily.  Psychiatric/Behavioral: Negative for dysphoric mood. The patient is not nervous/anxious.        Objective:   Physical Exam Well developed female in no acute distress Nose without purulent discharge noted Chest totally clear to auscultation, with no crackles or wheezes Cardiac exam was regular rate and rhythm Lower extremities without edema, no cyanosis Alert and oriented, moves all 4 extremities.       Assessment & Plan:

## 2012-02-05 DIAGNOSIS — J019 Acute sinusitis, unspecified: Secondary | ICD-10-CM | POA: Diagnosis not present

## 2012-02-05 DIAGNOSIS — IMO0002 Reserved for concepts with insufficient information to code with codable children: Secondary | ICD-10-CM | POA: Diagnosis not present

## 2012-02-22 DIAGNOSIS — R233 Spontaneous ecchymoses: Secondary | ICD-10-CM | POA: Diagnosis not present

## 2012-02-22 DIAGNOSIS — L820 Inflamed seborrheic keratosis: Secondary | ICD-10-CM | POA: Diagnosis not present

## 2012-02-23 DIAGNOSIS — M25529 Pain in unspecified elbow: Secondary | ICD-10-CM | POA: Diagnosis not present

## 2012-02-25 DIAGNOSIS — M25529 Pain in unspecified elbow: Secondary | ICD-10-CM | POA: Diagnosis not present

## 2012-03-01 DIAGNOSIS — M25529 Pain in unspecified elbow: Secondary | ICD-10-CM | POA: Diagnosis not present

## 2012-03-03 DIAGNOSIS — M25529 Pain in unspecified elbow: Secondary | ICD-10-CM | POA: Diagnosis not present

## 2012-03-07 DIAGNOSIS — H1710 Central corneal opacity, unspecified eye: Secondary | ICD-10-CM | POA: Diagnosis not present

## 2012-03-17 DIAGNOSIS — M25529 Pain in unspecified elbow: Secondary | ICD-10-CM | POA: Diagnosis not present

## 2012-03-24 DIAGNOSIS — M25529 Pain in unspecified elbow: Secondary | ICD-10-CM | POA: Diagnosis not present

## 2012-03-31 DIAGNOSIS — M7700 Medial epicondylitis, unspecified elbow: Secondary | ICD-10-CM | POA: Diagnosis not present

## 2012-03-31 DIAGNOSIS — M25529 Pain in unspecified elbow: Secondary | ICD-10-CM | POA: Diagnosis not present

## 2012-04-21 DIAGNOSIS — L255 Unspecified contact dermatitis due to plants, except food: Secondary | ICD-10-CM | POA: Diagnosis not present

## 2012-07-04 DIAGNOSIS — H1710 Central corneal opacity, unspecified eye: Secondary | ICD-10-CM | POA: Diagnosis not present

## 2012-07-04 DIAGNOSIS — B009 Herpesviral infection, unspecified: Secondary | ICD-10-CM | POA: Diagnosis not present

## 2012-07-05 DIAGNOSIS — M949 Disorder of cartilage, unspecified: Secondary | ICD-10-CM | POA: Diagnosis not present

## 2012-07-05 DIAGNOSIS — M899 Disorder of bone, unspecified: Secondary | ICD-10-CM | POA: Diagnosis not present

## 2012-07-13 DIAGNOSIS — H1710 Central corneal opacity, unspecified eye: Secondary | ICD-10-CM | POA: Diagnosis not present

## 2012-07-13 DIAGNOSIS — B009 Herpesviral infection, unspecified: Secondary | ICD-10-CM | POA: Diagnosis not present

## 2012-07-13 DIAGNOSIS — H11829 Conjunctivochalasis, unspecified eye: Secondary | ICD-10-CM | POA: Diagnosis not present

## 2012-09-04 DIAGNOSIS — Z23 Encounter for immunization: Secondary | ICD-10-CM | POA: Diagnosis not present

## 2012-10-27 DIAGNOSIS — L820 Inflamed seborrheic keratosis: Secondary | ICD-10-CM | POA: Diagnosis not present

## 2012-10-29 DIAGNOSIS — E559 Vitamin D deficiency, unspecified: Secondary | ICD-10-CM | POA: Diagnosis not present

## 2012-10-29 DIAGNOSIS — Z0000 Encounter for general adult medical examination without abnormal findings: Secondary | ICD-10-CM | POA: Diagnosis not present

## 2012-10-29 DIAGNOSIS — E785 Hyperlipidemia, unspecified: Secondary | ICD-10-CM | POA: Diagnosis not present

## 2012-11-17 ENCOUNTER — Telehealth: Payer: Self-pay | Admitting: Pulmonary Disease

## 2012-11-17 MED ORDER — OMEPRAZOLE 40 MG PO CPDR: 40.0000 mg | DELAYED_RELEASE_CAPSULE | Freq: Every day | ORAL | Status: DC

## 2012-11-17 NOTE — Telephone Encounter (Signed)
Called and spoke with pharmacy and they stated that the pt needs refill of the omeprazole 40 mg daily.  This has been sent to the pharmacy per pts request.

## 2012-11-24 DIAGNOSIS — N951 Menopausal and female climacteric states: Secondary | ICD-10-CM | POA: Diagnosis not present

## 2012-11-24 DIAGNOSIS — Z1212 Encounter for screening for malignant neoplasm of rectum: Secondary | ICD-10-CM | POA: Diagnosis not present

## 2012-11-24 DIAGNOSIS — M810 Age-related osteoporosis without current pathological fracture: Secondary | ICD-10-CM | POA: Diagnosis not present

## 2012-11-28 DIAGNOSIS — J019 Acute sinusitis, unspecified: Secondary | ICD-10-CM | POA: Diagnosis not present

## 2012-11-28 DIAGNOSIS — IMO0002 Reserved for concepts with insufficient information to code with codable children: Secondary | ICD-10-CM | POA: Diagnosis not present

## 2012-11-28 DIAGNOSIS — J471 Bronchiectasis with (acute) exacerbation: Secondary | ICD-10-CM | POA: Diagnosis not present

## 2012-12-28 DIAGNOSIS — H1710 Central corneal opacity, unspecified eye: Secondary | ICD-10-CM | POA: Diagnosis not present

## 2012-12-28 DIAGNOSIS — B009 Herpesviral infection, unspecified: Secondary | ICD-10-CM | POA: Diagnosis not present

## 2012-12-28 DIAGNOSIS — H11829 Conjunctivochalasis, unspecified eye: Secondary | ICD-10-CM | POA: Diagnosis not present

## 2013-01-18 ENCOUNTER — Ambulatory Visit (INDEPENDENT_AMBULATORY_CARE_PROVIDER_SITE_OTHER): Payer: Medicare Other | Admitting: Pulmonary Disease

## 2013-01-18 ENCOUNTER — Encounter: Payer: Self-pay | Admitting: Pulmonary Disease

## 2013-01-18 ENCOUNTER — Ambulatory Visit (INDEPENDENT_AMBULATORY_CARE_PROVIDER_SITE_OTHER)
Admission: RE | Admit: 2013-01-18 | Discharge: 2013-01-18 | Disposition: A | Discharge status: Home / Self Care | Payer: Medicare Other | Source: Ambulatory Visit | Attending: Pulmonary Disease | Admitting: Pulmonary Disease

## 2013-01-18 VITALS — BP 102/64 | HR 98 | Temp 98.1°F | Ht 63.5 in | Wt 139.0 lb

## 2013-01-18 DIAGNOSIS — J309 Allergic rhinitis, unspecified: Secondary | ICD-10-CM | POA: Diagnosis not present

## 2013-01-18 DIAGNOSIS — J479 Bronchiectasis, uncomplicated: Secondary | ICD-10-CM

## 2013-01-18 DIAGNOSIS — J984 Other disorders of lung: Secondary | ICD-10-CM | POA: Diagnosis not present

## 2013-01-18 MED ORDER — BENZONATATE 100 MG PO CAPS: ORAL_CAPSULE | ORAL | Status: DC

## 2013-01-18 NOTE — Progress Notes (Signed)
  Subjective:    Patient ID: Kiara Medina, female    DOB: 1946/06/30, 67 y.o.   MRN: [id number]  HPI The patient comes in today for followup of her known bronchiectasis with MAC colonization, as well as chronic cough that is primarily upper airway in origin and related to postnasal drip.  From a pulmonary standpoint she has done very well, with no increased shortness of breath, no increased mucous production, and no recent acute exacerbation.  She has been eating well, with no weight loss.  She denies any constitutional symptoms.  She has noted an increase in her dry cough, and is clearing her throat constantly during our visit today.  She is having postnasal drip despite taking the Zyrtec once a day.  She is using her nasal spray only periodically.   Review of Systems  Constitutional: Negative for fever and unexpected weight change.  HENT: Positive for congestion, rhinorrhea and postnasal drip. Negative for ear pain, nosebleeds, sore throat, sneezing, trouble swallowing, dental problem and sinus pressure.   Eyes: Negative for redness and itching.  Respiratory: Positive for cough (dry). Negative for chest tightness, shortness of breath and wheezing.   Cardiovascular: Negative for palpitations and leg swelling.  Gastrointestinal: Negative for nausea and vomiting.  Genitourinary: Negative for dysuria.  Musculoskeletal: Positive for joint swelling ( arthritis) and arthralgias.  Skin: Negative for rash.  Neurological: Negative for headaches.  Hematological: Does not bruise/bleed easily.  Psychiatric/Behavioral: Negative for dysphoric mood. The patient is not nervous/anxious.        Objective:   Physical Exam Well-developed female in no acute distress Nose without purulence or discharge noted Neck without lymphadenopathy or thyromegaly Chest really clear to auscultation, no wheezing Cardiac exam with regular rate and rhythm Lower extremities without edema, cyanosis Alert and oriented, moves  all 4 extremities.       Assessment & Plan:

## 2013-01-18 NOTE — Assessment & Plan Note (Signed)
The patient continues to have postnasal drip that is leading to what sounds like an upper airway cough.  Will try chlorpheniramine, and I have also asked her to use her nasal corticosteroid on a regular basis.

## 2013-01-18 NOTE — Patient Instructions (Addendum)
Hold your current allergy med, and try chlorpheniramine 4mg  OTC, 2 at bedtime each day. Try using your nasal spray 2 each nostril each am everyday for 2 -3 weeks to see if cough/throat clearing improves. Try to avoid throat clearing, use hard candy Ok to use tessalon pearls, but if the cough continues to be an issue I think we should recheck a scan of your chest.  Let me know. If doing well, followup with me in one year with same day cxr.

## 2013-01-18 NOTE — Assessment & Plan Note (Signed)
The patient has known bronchiectasis with MAC colonization, but there is nothing to suggest a true infection at this point.  I am concerned about her increased cough, however it sounds more upper airway in origin today.  I would like to treat her more aggressively for allergic rhinitis and postnasal drip, and if her cough does not improve, I would to a CT scan of her chest.

## 2013-01-18 NOTE — Addendum Note (Signed)
Addended by: Nita Sells on: 01/18/2013 04:41 PM   Modules accepted: Orders

## 2013-01-20 ENCOUNTER — Ambulatory Visit: Payer: Medicare Other | Admitting: Pulmonary Disease

## 2013-02-11 DIAGNOSIS — E559 Vitamin D deficiency, unspecified: Secondary | ICD-10-CM | POA: Diagnosis not present

## 2013-02-11 DIAGNOSIS — E785 Hyperlipidemia, unspecified: Secondary | ICD-10-CM | POA: Diagnosis not present

## 2013-02-11 DIAGNOSIS — R0982 Postnasal drip: Secondary | ICD-10-CM | POA: Diagnosis not present

## 2013-02-16 ENCOUNTER — Telehealth: Payer: Self-pay | Admitting: Pulmonary Disease

## 2013-02-16 MED ORDER — OMEPRAZOLE 40 MG PO CPDR: 40.0000 mg | DELAYED_RELEASE_CAPSULE | Freq: Every day | ORAL | Status: DC

## 2013-02-16 NOTE — Telephone Encounter (Signed)
I spoke with the pt and she states she received a call form [facility] stating that she had a refill ready for omeprazole for 30 days, which pt states should be a 90 day supply. I advised the pt that I will call the pharmacy and call in a 90 day supply. Rx called in. Carron Curie, CMA

## 2013-04-28 DIAGNOSIS — R21 Rash and other nonspecific skin eruption: Secondary | ICD-10-CM | POA: Diagnosis not present

## 2013-05-16 DIAGNOSIS — E785 Hyperlipidemia, unspecified: Secondary | ICD-10-CM | POA: Diagnosis not present

## 2013-06-28 DIAGNOSIS — H11829 Conjunctivochalasis, unspecified eye: Secondary | ICD-10-CM | POA: Diagnosis not present

## 2013-06-28 DIAGNOSIS — H1710 Central corneal opacity, unspecified eye: Secondary | ICD-10-CM | POA: Diagnosis not present

## 2013-06-28 DIAGNOSIS — B009 Herpesviral infection, unspecified: Secondary | ICD-10-CM | POA: Diagnosis not present

## 2013-06-28 DIAGNOSIS — H2510 Age-related nuclear cataract, unspecified eye: Secondary | ICD-10-CM | POA: Diagnosis not present

## 2013-08-14 HISTORY — PX: CATARACT EXTRACTION: SUR2

## 2013-09-01 DIAGNOSIS — H2510 Age-related nuclear cataract, unspecified eye: Secondary | ICD-10-CM | POA: Diagnosis not present

## 2013-09-05 DIAGNOSIS — K219 Gastro-esophageal reflux disease without esophagitis: Secondary | ICD-10-CM | POA: Diagnosis not present

## 2013-09-05 DIAGNOSIS — H2589 Other age-related cataract: Secondary | ICD-10-CM | POA: Diagnosis not present

## 2013-09-05 DIAGNOSIS — E785 Hyperlipidemia, unspecified: Secondary | ICD-10-CM | POA: Diagnosis not present

## 2013-09-06 DIAGNOSIS — Z4889 Encounter for other specified surgical aftercare: Secondary | ICD-10-CM | POA: Diagnosis not present

## 2013-09-06 DIAGNOSIS — Z961 Presence of intraocular lens: Secondary | ICD-10-CM | POA: Diagnosis not present

## 2013-09-10 DIAGNOSIS — Z23 Encounter for immunization: Secondary | ICD-10-CM | POA: Diagnosis not present

## 2013-11-22 DIAGNOSIS — Z0000 Encounter for general adult medical examination without abnormal findings: Secondary | ICD-10-CM | POA: Diagnosis not present

## 2013-11-22 DIAGNOSIS — J479 Bronchiectasis, uncomplicated: Secondary | ICD-10-CM | POA: Diagnosis not present

## 2013-11-22 DIAGNOSIS — E785 Hyperlipidemia, unspecified: Secondary | ICD-10-CM | POA: Diagnosis not present

## 2013-11-22 DIAGNOSIS — R5381 Other malaise: Secondary | ICD-10-CM | POA: Diagnosis not present

## 2013-11-22 DIAGNOSIS — E559 Vitamin D deficiency, unspecified: Secondary | ICD-10-CM | POA: Diagnosis not present

## 2013-11-27 DIAGNOSIS — J471 Bronchiectasis with (acute) exacerbation: Secondary | ICD-10-CM | POA: Diagnosis not present

## 2013-11-27 DIAGNOSIS — J019 Acute sinusitis, unspecified: Secondary | ICD-10-CM | POA: Diagnosis not present

## 2013-12-27 DIAGNOSIS — H2640 Unspecified secondary cataract: Secondary | ICD-10-CM | POA: Diagnosis not present

## 2014-01-19 ENCOUNTER — Encounter: Payer: Self-pay | Admitting: Pulmonary Disease

## 2014-01-19 ENCOUNTER — Ambulatory Visit (INDEPENDENT_AMBULATORY_CARE_PROVIDER_SITE_OTHER)
Admission: RE | Admit: 2014-01-19 | Discharge: 2014-01-19 | Disposition: A | Discharge status: Home / Self Care | Payer: Medicare Other | Source: Ambulatory Visit | Attending: Pulmonary Disease | Admitting: Pulmonary Disease

## 2014-01-19 ENCOUNTER — Encounter (INDEPENDENT_AMBULATORY_CARE_PROVIDER_SITE_OTHER): Payer: Self-pay

## 2014-01-19 ENCOUNTER — Ambulatory Visit (INDEPENDENT_AMBULATORY_CARE_PROVIDER_SITE_OTHER): Payer: Medicare Other | Admitting: Pulmonary Disease

## 2014-01-19 VITALS — BP 120/76 | HR 81 | Temp 97.6°F | Ht 63.25 in | Wt 133.8 lb

## 2014-01-19 DIAGNOSIS — R0989 Other specified symptoms and signs involving the circulatory and respiratory systems: Secondary | ICD-10-CM | POA: Diagnosis not present

## 2014-01-19 DIAGNOSIS — J479 Bronchiectasis, uncomplicated: Secondary | ICD-10-CM

## 2014-01-19 DIAGNOSIS — R05 Cough: Secondary | ICD-10-CM | POA: Diagnosis not present

## 2014-01-19 DIAGNOSIS — R059 Cough, unspecified: Secondary | ICD-10-CM | POA: Diagnosis not present

## 2014-01-19 NOTE — Assessment & Plan Note (Signed)
The patient has a history he sits, and recently was treated over the Christmas holiday with antibiotics for a sinobronchitis. She is seen significant improvement in her congestion and mucus, and her cough has nearly resolved. However, she has new symptoms that suggest airways disease, including air trapping. Her spirometry today is not definitively abnormal, but it does suggest air trapping. Will try her on albuterol as needed, and if she does well with this, may consider a maintenance LABA. She will also need a repeat CT chest given her worsening nodular infiltrates on chest x-ray today.

## 2014-01-19 NOTE — Patient Instructions (Addendum)
Will check ct chest to evaluate your chest xray abnormalities.  Will call you with results.  Will try albuterol 2 puffs every 6 hrs if needed for abnormal feeling in chest or increased shortness of breath.  Will send in another prescription for your omeprazole to your new pharmacy Will arrange folllowup with me after discussing ct scan.

## 2014-01-19 NOTE — Progress Notes (Signed)
   Subjective:    Patient ID: Kiara Medina, female    DOB: 29-Sep-1946, 68 y.o.   MRN: [id number]  HPI The patient comes in today for followup of her known bronchiectasis with MAC colonization. She has not been seen in one year, and her cough and breathing had been doing fairly well. In December of last year, she developed a sinobronchitis, and was treated with 2 rounds of antibiotics with improvement. Her mucus is clearing and decreased in quantity, but she still has some mild cough. Her main complaint is that she feels that she is unable to take a deep breath or exhaling adequately. She has also seen some increased shortness of breath with exertional activities.  Her chest x-ray today does show some increased nodular infiltration in the right lung.   Review of Systems  Constitutional: Negative for fever and unexpected weight change.  HENT: Positive for congestion. Negative for dental problem, ear pain, nosebleeds, postnasal drip, rhinorrhea, sinus pressure, sneezing, sore throat and trouble swallowing.   Eyes: Negative for redness and itching.  Respiratory: Positive for cough and shortness of breath. Negative for chest tightness and wheezing.   Cardiovascular: Negative for palpitations and leg swelling.  Gastrointestinal: Negative for nausea and vomiting.  Genitourinary: Negative for dysuria.  Musculoskeletal: Negative for joint swelling.  Skin: Negative for rash.  Neurological: Negative for headaches.  Hematological: Does not bruise/bleed easily.  Psychiatric/Behavioral: Negative for dysphoric mood. The patient is not nervous/anxious.        Objective:   Physical Exam Well-developed female in no acute distress Nose without purulence or discharge noted Neck without lymphadenopathy or thyromegaly Chest with fairly clear breath sounds, a few scattered crackles, no wheezing Cardiac exam with regular rate and rhythm Lower extremities without edema, no cyanosis Alert and oriented, moves all  4 extremities.       Assessment & Plan:

## 2014-01-22 ENCOUNTER — Telehealth: Payer: Self-pay | Admitting: Pulmonary Disease

## 2014-01-22 MED ORDER — OMEPRAZOLE 40 MG PO CPDR: 40.0000 mg | DELAYED_RELEASE_CAPSULE | Freq: Every day | ORAL | Status: DC

## 2014-01-22 NOTE — Telephone Encounter (Signed)
Rx has been sent in. Pt is aware. 

## 2014-01-24 ENCOUNTER — Other Ambulatory Visit: Payer: Self-pay | Admitting: Pulmonary Disease

## 2014-01-24 DIAGNOSIS — J479 Bronchiectasis, uncomplicated: Secondary | ICD-10-CM

## 2014-01-29 ENCOUNTER — Ambulatory Visit (INDEPENDENT_AMBULATORY_CARE_PROVIDER_SITE_OTHER)
Admission: RE | Admit: 2014-01-29 | Discharge: 2014-01-29 | Disposition: A | Discharge status: Home / Self Care | Payer: Medicare Other | Source: Ambulatory Visit | Attending: Pulmonary Disease | Admitting: Pulmonary Disease

## 2014-01-29 DIAGNOSIS — J479 Bronchiectasis, uncomplicated: Secondary | ICD-10-CM

## 2014-01-30 ENCOUNTER — Inpatient Hospital Stay: Admission: RE | Admit: 2014-01-30 | Payer: Medicare Other | Source: Ambulatory Visit

## 2014-01-31 ENCOUNTER — Telehealth: Payer: Self-pay | Admitting: Pulmonary Disease

## 2014-01-31 DIAGNOSIS — J479 Bronchiectasis, uncomplicated: Secondary | ICD-10-CM

## 2014-01-31 NOTE — Telephone Encounter (Signed)
Need sputum culture put in computer for routine c and s, and AFB smear and culture as well.  Pt will come by for sputum cup and will bring specimen back another day.

## 2014-02-01 NOTE — Telephone Encounter (Signed)
Orders have been placed for AFB and culture. Will close message.

## 2014-02-22 ENCOUNTER — Other Ambulatory Visit: Payer: Medicare Other

## 2014-02-22 ENCOUNTER — Telehealth: Payer: Self-pay | Admitting: Pulmonary Disease

## 2014-02-22 ENCOUNTER — Other Ambulatory Visit: Payer: Self-pay | Admitting: Pulmonary Disease

## 2014-02-22 DIAGNOSIS — Z1212 Encounter for screening for malignant neoplasm of rectum: Secondary | ICD-10-CM | POA: Diagnosis not present

## 2014-02-22 DIAGNOSIS — J479 Bronchiectasis, uncomplicated: Secondary | ICD-10-CM

## 2014-02-22 DIAGNOSIS — Z124 Encounter for screening for malignant neoplasm of cervix: Secondary | ICD-10-CM | POA: Diagnosis not present

## 2014-02-22 DIAGNOSIS — Z1231 Encounter for screening mammogram for malignant neoplasm of breast: Secondary | ICD-10-CM | POA: Diagnosis not present

## 2014-02-22 MED ORDER — DOXYCYCLINE HYCLATE 100 MG PO TABS: ORAL_TABLET | ORAL | Status: DC

## 2014-02-22 NOTE — Telephone Encounter (Signed)
Per CY: okay for doxycycline 100mg  #8, 2 today then 1 daily until gone.  Called spoke with patient and advised of rx as stated by CY.  Pt okay with this and verbalized her understanding.  Pt aware to call back if symptoms do not improve or worsen or will seek emergency care if needed.    Nothing further needed at this time; will sign off.

## 2014-02-22 NOTE — Telephone Encounter (Signed)
Called and spoke with the pt  She states that she is having increased cough for the past 3 days- prod with larger amounts of light yellow/tan sputum  She reports no fever or changes in her breathing, hemoptysis  Pt has Bronchiectasis and came in this am to leave sputum specimen for AFB and c and s Will forward to doc of the day for recs  Please advise, thanks! No Known Allergies Current Outpatient Prescriptions on File Prior to Visit  Medication Sig Dispense Refill  . acyclovir (ZOVIRAX) 400 MG tablet Take 800 mg by mouth daily.        Marland Kitchen atorvastatin (LIPITOR) 20 MG tablet Take 20 mg by mouth daily.      Marland Kitchen BIOTIN 5000 PO Take 2 capsules by mouth daily.      . Calcium Carbonate-Vitamin D (CALCIUM-VITAMIN D3) 600-200 MG-UNIT TABS Take 1 tablet by mouth 2 (two) times daily.        . Cholecalciferol (VITAMIN D3) 1000 UNITS CAPS Take 2,000 Units by mouth daily.      . Cyanocobalamin (VITAMIN B-12) 2500 MCG SUBL Place under the tongue daily.        [name] Calcium (STOOL SOFTENER PO) Take 100 mg by mouth daily.      Marland Kitchen estradiol (ESTRACE) 1 MG tablet Take 1 mg by mouth daily.        . fluorometholone (FML) 0.1 % ophthalmic suspension 1 drop every 4 (four) hours. As directed.       . fluticasone (FLONASE) 50 MCG/ACT nasal spray Place 2 sprays into the nose as needed.        Marland Kitchen omeprazole (PRILOSEC) 40 MG capsule Take 1 capsule (40 mg total) by mouth daily.  90 capsule  1  . Red Yeast Rice 600 MG TABS Take 2 tablets by mouth daily.       No current facility-administered medications on file prior to visit.

## 2014-02-22 NOTE — Telephone Encounter (Signed)
ATC # provided pt NA WCB

## 2014-02-23 ENCOUNTER — Telehealth: Payer: Self-pay | Admitting: Critical Care Medicine

## 2014-02-23 NOTE — Telephone Encounter (Signed)
The pt called for c/0 cough but the pt refused to speak to me Wants clance only

## 2014-02-23 NOTE — Telephone Encounter (Signed)
Discussed results with pt in detail, and answered all of her questions.

## 2014-02-25 LAB — RESPIRATORY CULTURE OR RESPIRATORY AND SPUTUM CULTURE: ORGANISM ID, BACTERIA: NORMAL

## 2014-02-26 ENCOUNTER — Telehealth: Payer: Self-pay | Admitting: Pulmonary Disease

## 2014-02-26 NOTE — Telephone Encounter (Signed)
Spoke with Fredna Dow from [employer]. She is calling in regards to pt sputum results. I advised her of [place] response. Nothing further needed

## 2014-03-02 DIAGNOSIS — M19079 Primary osteoarthritis, unspecified ankle and foot: Secondary | ICD-10-CM | POA: Diagnosis not present

## 2014-03-02 DIAGNOSIS — M2040 Other hammer toe(s) (acquired), unspecified foot: Secondary | ICD-10-CM | POA: Diagnosis not present

## 2014-03-06 ENCOUNTER — Telehealth: Payer: Self-pay | Admitting: Pulmonary Disease

## 2014-03-06 MED ORDER — CIPROFLOXACIN HCL 750 MG PO TABS: 750.0000 mg | ORAL_TABLET | Freq: Two times a day (BID) | ORAL | Status: DC

## 2014-03-06 NOTE — Telephone Encounter (Signed)
Pt is wanting to know if final report is back on sputum results. Do not see anything in EPIC. Please advise [place] thanks

## 2014-03-06 NOTE — Telephone Encounter (Signed)
[facility] Cipro 750 mg bid x [facility] [place]

## 2014-03-06 NOTE — Telephone Encounter (Signed)
Discussed with pt at great length.  Her sputum grew MAC, and had GNR but normal flora on final culture.  Discussed with pt treating for usual organisms associated with bronchiectasis to see if cough resolves.  If she remains symptomatic, will need to consider treatment for mac.  Please call in script for cipro 750mg  one bid for 10 days.   [facility] [place]

## 2014-03-07 ENCOUNTER — Telehealth: Payer: Self-pay | Admitting: Pulmonary Disease

## 2014-03-07 NOTE — Telephone Encounter (Signed)
These critical STAT lab results are in [facility] Lab Results folder to review. [place] aware. Nothing further needed.

## 2014-03-07 NOTE — Telephone Encounter (Signed)
Transferred call to [place]. spm

## 2014-03-15 ENCOUNTER — Telehealth: Payer: Self-pay | Admitting: Pulmonary Disease

## 2014-03-15 NOTE — Telephone Encounter (Signed)
Why dont we get her in for ov so we can get things settled before she leaves.

## 2014-03-15 NOTE — Telephone Encounter (Signed)
Kathee Delton, MD at 03/06/2014 10:29 AM     Status: Signed        Discussed with pt at great length. Her sputum grew MAC, and had GNR but normal flora on final culture. Discussed with pt treating for usual organisms associated with bronchiectasis to see if cough resolves. If she remains symptomatic, will need to consider treatment for mac.  Please call in script for cipro 750mg  one bid for 10 days. [facility] patient-she states that she will finish Cipro Rx today and continues to notice a cough-productive-beige in color but not as bad as when she first started Cipro Rx. Pt has not had any N & V, fever, or chills. Pt would like to know if she needs to continue an abx and/or be seen for follow up. [place] please advise; pt states she is planning on a trip to [place] in June and wants to make sure all of this is cleared up by then. Thanks.

## 2014-03-15 NOTE — Telephone Encounter (Signed)
Spoke with patient. Patient does not leave for [place] until June Patient is going to call back once she has her calendar in front of her and can be 100% sure she is free to come in. Will wait for her call back to schedule appt.

## 2014-03-19 NOTE — Telephone Encounter (Signed)
Pt made apt on 4/8.  Nothing further needed at this time.

## 2014-03-21 ENCOUNTER — Ambulatory Visit (INDEPENDENT_AMBULATORY_CARE_PROVIDER_SITE_OTHER): Payer: Medicare Other | Admitting: Pulmonary Disease

## 2014-03-21 ENCOUNTER — Encounter: Payer: Self-pay | Admitting: Pulmonary Disease

## 2014-03-21 ENCOUNTER — Encounter (INDEPENDENT_AMBULATORY_CARE_PROVIDER_SITE_OTHER): Payer: Self-pay

## 2014-03-21 VITALS — BP 122/70 | HR 89 | Temp 97.7°F | Ht 63.5 in | Wt 131.6 lb

## 2014-03-21 DIAGNOSIS — J479 Bronchiectasis, uncomplicated: Secondary | ICD-10-CM

## 2014-03-21 NOTE — Progress Notes (Signed)
   Subjective:    Patient ID: Kiara Medina, female    DOB: [birth year], 68 y.o.   MRN: [id number]  HPI The patient comes in today for followup of her known bronchiectasis. She has had recent increasing cough and congestion, and a followup chest x-ray showed worsening of her nodular process. She underwent a CT scan of the chest which showed her bronchiectasis, as well as some peri- Bronchovascular nodularity most consistent with MAC.  A sputum culture was sent which grew MAC, as well as gram-negative rods that showed insufficient growth. She has been treated with a course of ciprofloxacin with a significant improvement in her cough and congestion. She feels that she is nearly back to baseline. She still has some cough and frequent throat clearing related to postnasal drip from her allergic disease.   Review of Systems  Constitutional: Negative for fever and unexpected weight change.  HENT: Negative for congestion, dental problem, ear pain, nosebleeds, postnasal drip, rhinorrhea, sinus pressure, sneezing, sore throat and trouble swallowing.   Eyes: Negative for redness and itching.  Respiratory: Positive for cough and shortness of breath. Negative for chest tightness and wheezing.   Cardiovascular: Negative for palpitations and leg swelling.  Gastrointestinal: Negative for nausea and vomiting.  Genitourinary: Negative for dysuria.  Musculoskeletal: Negative for joint swelling.  Skin: Negative for rash.  Neurological: Negative for headaches.  Hematological: Does not bruise/bleed easily.  Psychiatric/Behavioral: Negative for dysphoric mood. The patient is not nervous/anxious.        Objective:   Physical Exam Thin female in no acute distress Nose without purulence or discharge noted Neck without lymphadenopathy or thyromegaly Chest totally clear to auscultation, no crackles or wheezes Cardiac exam with regular rate and rhythm, 2/6 systolic murmur Lower extremities without edema, no  cyanosis Alert and oriented, moves all 4 extremities.       Assessment & Plan:

## 2014-03-21 NOTE — Assessment & Plan Note (Signed)
The patient's sputum culture was positive for MAC, as well as gram-negative rods with insufficient growth. She's been treated with ciprofloxacin for 10 days, and saw significant improvement in her cough and congestion. Hopefully, this represented a mild bronchiectasis flare, and not her MAC. Only time will tell. She has been on as needed albuterol, but has had some increased shortness of breath. I would like to try her on a LABA for the next 4 weeks to see if she notices a big improvement. She does have air-trapping on her recent spirometry.

## 2014-03-21 NOTE — Patient Instructions (Signed)
Will try on striverdi, 2 inhalations each am for the next 4 weeks.  Let me know if this helps your breathing enough that you would want to stay on it. Can use albuterol for rescue if needed. Continue allergy meds for your postnasal drip Will see you back in 58mos if you are doing well, but let us know if increased symptoms.   Please call as your trip out of the country gets closer, and we can get prescriptions to fill and take with your for emergencies.

## 2014-04-03 ENCOUNTER — Telehealth: Payer: Self-pay | Admitting: Pulmonary Disease

## 2014-04-03 MED ORDER — CIPROFLOXACIN HCL 750 MG PO TABS: 750.0000 mg | ORAL_TABLET | Freq: Two times a day (BID) | ORAL | Status: DC

## 2014-04-03 NOTE — Telephone Encounter (Signed)
Per OV 03/21/14; Will try on striverdi, 2 inhalations each am for the next 4 weeks.  Let me know if this helps your breathing enough that you would want to stay on it. Can use albuterol for rescue if needed. Continue allergy meds for your postnasal drip Will see you back in 72mos if you are doing well, but let us know if increased symptoms.    Please call as your trip out of the country gets closer, and we can get prescriptions to fill and take with your for emergencies.  ---  Spoke with pt. Reports x 1 week now she has had prod cough w/ yelllow-beige colored phlem, PND, frequent clearing throat. No wheezing, no chest tx, feels the mucus is coming from her chest. She is still taking zyrtec D. Requesting recs. Please advise [place] thanks  No Known Allergies

## 2014-04-03 NOTE — Telephone Encounter (Signed)
Rx was sent to pharm

## 2014-04-03 NOTE — Telephone Encounter (Signed)
Called and discussed with her again.  The cipro resolved her cough and congestion.  Will try again with a 14 days course and see if it is more long lasting.  I am concerned we may end up treating her for mac.   cipro 750mg  one bid for 14 [facility] [place]

## 2014-04-17 ENCOUNTER — Telehealth: Payer: Self-pay | Admitting: Pulmonary Disease

## 2014-04-17 NOTE — Telephone Encounter (Signed)
Spoke with patient--completed course of abx 04/16/14 Pt states that her symptoms seem to be resolved at this time. Will keep [place] up to date as her trip approaches. Leaves for trip 06/02/14---will need medications to take with her around this time.   Will send to Dr Gwenette Greet as Juluis Rainier.

## 2014-04-23 LAB — AFB CULTURE WITH SMEAR (NOT AT ARMC)

## 2014-05-02 DIAGNOSIS — IMO0002 Reserved for concepts with insufficient information to code with codable children: Secondary | ICD-10-CM | POA: Diagnosis not present

## 2014-05-02 DIAGNOSIS — J019 Acute sinusitis, unspecified: Secondary | ICD-10-CM | POA: Diagnosis not present

## 2014-05-09 DIAGNOSIS — D1739 Benign lipomatous neoplasm of skin and subcutaneous tissue of other sites: Secondary | ICD-10-CM | POA: Diagnosis not present

## 2014-05-09 DIAGNOSIS — W57XXXA Bitten or stung by nonvenomous insect and other nonvenomous arthropods, initial encounter: Secondary | ICD-10-CM | POA: Diagnosis not present

## 2014-05-09 DIAGNOSIS — L820 Inflamed seborrheic keratosis: Secondary | ICD-10-CM | POA: Diagnosis not present

## 2014-05-09 DIAGNOSIS — T148 Other injury of unspecified body region: Secondary | ICD-10-CM | POA: Diagnosis not present

## 2014-05-15 DIAGNOSIS — M2040 Other hammer toe(s) (acquired), unspecified foot: Secondary | ICD-10-CM | POA: Diagnosis not present

## 2014-05-15 DIAGNOSIS — M19079 Primary osteoarthritis, unspecified ankle and foot: Secondary | ICD-10-CM | POA: Diagnosis not present

## 2014-05-21 ENCOUNTER — Telehealth: Payer: Self-pay | Admitting: Pulmonary Disease

## 2014-05-21 MED ORDER — LEVOFLOXACIN 750 MG PO TABS: 750.0000 mg | ORAL_TABLET | Freq: Every day | ORAL | Status: DC

## 2014-05-21 MED ORDER — PREDNISONE 10 MG PO TABS: ORAL_TABLET | ORAL | Status: DC

## 2014-05-21 NOTE — Telephone Encounter (Signed)
Will give striverdi samples until she can get back.  She is to call us upon return [place] to call in prednisone 8 day taper, and also levaquin 750mg  one a day for 5 days.

## 2014-05-21 NOTE — Telephone Encounter (Signed)
Pt aware of recs. Samples left for pick up and RX;s sent in. Nothing further needed

## 2014-05-21 NOTE — Telephone Encounter (Signed)
Per OV 03/21/14; Patient Instructions      Will try on striverdi, 2 inhalations each am for the next 4 weeks.  Let me know if this helps your breathing enough that you would want to stay on it. Can use albuterol for rescue if needed. Continue allergy meds for your postnasal drip Will see you back in 81mos if you are doing well, but let us know if increased symptoms.    Please call as your trip out of the country gets closer, and we can get prescriptions to fill and take with your for emergencies.      --  Called spoke with pt. She is going out of the country next week. She is wanting the emergency RX's to be [facility] [place]. She thinks it was to be an ABX and prednisone. Also she wants [place] to know her insurance does not cover striverdi. She wants samples or an alternative. Please advise [place] thanks  No Known Allergies

## 2014-05-23 ENCOUNTER — Telehealth: Payer: Self-pay | Admitting: Pulmonary Disease

## 2014-05-23 NOTE — Telephone Encounter (Signed)
Called spoke with pt. She picked up RX for levaquin #5 tablets. She wanted to make sure this is correct. i advised her this was so. Nothing further needed

## 2014-07-02 ENCOUNTER — Encounter: Payer: Self-pay | Admitting: Pulmonary Disease

## 2014-07-15 ENCOUNTER — Other Ambulatory Visit: Payer: Self-pay | Admitting: Pulmonary Disease

## 2014-07-20 DIAGNOSIS — R599 Enlarged lymph nodes, unspecified: Secondary | ICD-10-CM | POA: Diagnosis not present

## 2014-07-20 DIAGNOSIS — IMO0002 Reserved for concepts with insufficient information to code with codable children: Secondary | ICD-10-CM | POA: Diagnosis not present

## 2014-07-23 DIAGNOSIS — M949 Disorder of cartilage, unspecified: Secondary | ICD-10-CM | POA: Diagnosis not present

## 2014-07-23 DIAGNOSIS — M899 Disorder of bone, unspecified: Secondary | ICD-10-CM | POA: Diagnosis not present

## 2014-08-06 ENCOUNTER — Other Ambulatory Visit: Payer: Self-pay | Admitting: Pulmonary Disease

## 2014-08-10 ENCOUNTER — Telehealth: Payer: Self-pay | Admitting: Pulmonary Disease

## 2014-08-10 NOTE — Telephone Encounter (Signed)
Patient returning call.  [phone]

## 2014-08-10 NOTE — Telephone Encounter (Signed)
lmtcb for pt.  

## 2014-08-10 NOTE — Telephone Encounter (Signed)
Called the pt and she stated that the pharmacy has this resolved and she is able to pick this up now.  Nothing further is needed.

## 2014-09-06 DIAGNOSIS — Z23 Encounter for immunization: Secondary | ICD-10-CM | POA: Diagnosis not present

## 2014-09-12 DIAGNOSIS — H11829 Conjunctivochalasis, unspecified eye: Secondary | ICD-10-CM | POA: Diagnosis not present

## 2014-09-12 DIAGNOSIS — Z961 Presence of intraocular lens: Secondary | ICD-10-CM | POA: Diagnosis not present

## 2014-09-12 DIAGNOSIS — H2510 Age-related nuclear cataract, unspecified eye: Secondary | ICD-10-CM | POA: Diagnosis not present

## 2014-09-20 ENCOUNTER — Ambulatory Visit (INDEPENDENT_AMBULATORY_CARE_PROVIDER_SITE_OTHER): Payer: Medicare Other | Admitting: Pulmonary Disease

## 2014-09-20 ENCOUNTER — Encounter: Payer: Self-pay | Admitting: Pulmonary Disease

## 2014-09-20 VITALS — BP 130/64 | HR 97 | Temp 97.2°F | Ht 64.0 in | Wt 131.0 lb

## 2014-09-20 DIAGNOSIS — J449 Chronic obstructive pulmonary disease, unspecified: Secondary | ICD-10-CM | POA: Diagnosis not present

## 2014-09-20 DIAGNOSIS — J479 Bronchiectasis, uncomplicated: Secondary | ICD-10-CM

## 2014-09-20 MED ORDER — MOMETASONE FURO-FORMOTEROL FUM 100-5 MCG/ACT IN AERO: 2.0000 | INHALATION_SPRAY | Freq: Two times a day (BID) | RESPIRATORY_TRACT | Status: DC

## 2014-09-20 NOTE — Progress Notes (Signed)
   Subjective:    Patient ID: Kiara Medina, female    DOB: 1946/11/18, 68 y.o.   MRN: [id number]  HPI The patient comes in today for followup of her known bronchiectasis with MAC colonization. She also has mild air-trapping on prior PFTs. Earlier this year she had issues with increasing chest congestion and purulent mucus with cough, but responded to 2 rounds of antibiotics. She recently went on a trip to [place] and did very well, but the last 2 weeks has had increasing dry cough with only occasional clear mucus. Primarily she has noticed increasing dyspnea on exertion, and a feeling that she cannot take a deep breath. The way she describes her abnormal sensation in her chest it is most consistent with air trapping. She has been tried on a LABA in the past, and has not seen a big difference.  She has not been losing weight, and her appetite has been fairly normal but not up to her expectations.   Review of Systems  Constitutional: Negative for fever and unexpected weight change.  HENT: Positive for congestion and postnasal drip. Negative for dental problem, ear pain, nosebleeds, rhinorrhea, sinus pressure, sneezing, sore throat and trouble swallowing.   Eyes: Negative for redness and itching.  Respiratory: Positive for cough and shortness of breath. Negative for chest tightness and wheezing.   Cardiovascular: Negative for palpitations and leg swelling.  Gastrointestinal: Negative for nausea and vomiting.  Genitourinary: Negative for dysuria.  Musculoskeletal: Negative for joint swelling.  Skin: Negative for rash.  Neurological: Negative for headaches.  Hematological: Does not bruise/bleed easily.  Psychiatric/Behavioral: Negative for dysphoric mood. The patient is not nervous/anxious.        Objective:   Physical Exam Well-developed female in no acute distress Nose without purulence or discharge noted Neck without lymphadenopathy or thyromegaly Chest with decreased breath sounds, no  active wheezing Cardiac exam with regular rate and rhythm Alert and oriented, moves all 4 extremities. Lower extremities without edema, no cyanosis       Assessment & Plan:

## 2014-09-20 NOTE — Assessment & Plan Note (Signed)
The patient has a long history of bronchiectasis with MAC colonization, and currently is not having a lot of chest congestion or mucus. We will continue to monitor her closely.

## 2014-09-20 NOTE — Addendum Note (Signed)
Addended by: Mathis Bud on: 09/20/2014 12:11 PM   Modules accepted: Orders

## 2014-09-20 NOTE — Patient Instructions (Signed)
Will try on dulera 100/5  2 inhalations am and pm everyday on regular basis with spacer.  Rinse mouth out well after using. Please call us in 3-4 weeks with update on how things are going.

## 2014-09-20 NOTE — Assessment & Plan Note (Signed)
The patient is having significant dyspnea on exertion, and is describing classic air trapping. Even though her FEV1 percent is normal, she clearly has a decline in FEV1 and evidence for significant air trapping. I would like to start her on a LABA/ICS and see if she has improvement.

## 2014-10-03 DIAGNOSIS — J471 Bronchiectasis with (acute) exacerbation: Secondary | ICD-10-CM | POA: Diagnosis not present

## 2014-10-03 DIAGNOSIS — Z6822 Body mass index (BMI) 22.0-22.9, adult: Secondary | ICD-10-CM | POA: Diagnosis not present

## 2014-10-03 DIAGNOSIS — J019 Acute sinusitis, unspecified: Secondary | ICD-10-CM | POA: Diagnosis not present

## 2014-10-15 ENCOUNTER — Telehealth: Payer: Self-pay | Admitting: Pulmonary Disease

## 2014-10-15 NOTE — Telephone Encounter (Signed)
Let pt know that if she was recently treated with abx/prednisone, and we have had her on an inhaler for her increased sob, and no better, probably need to see her in office to see if we can figure out what is going on.  Unfortunately I am out until Friday.  Will need to see someone and will need a chest xray same visit.

## 2014-10-15 NOTE — Telephone Encounter (Signed)
Called and spoke to pt. Appt made with TP on Friday 11/6. Pt verbalized understanding and denied any further questions or concerns at this time.

## 2014-10-15 NOTE — Telephone Encounter (Signed)
Called and spoke to pt. Pt was last seen on 10/8 by [facility]. Pt stated her SOB has not changed since last OV with [place] and is an increase in baseline. Pt also c/o cough with intermittent mucus production with tan and yellow mucus that worsens at night. Pt was treated for a sinus infection by her PCP on 10/21 with a steroid injection in office, augmentin and pred taper that she finished on 10/30. Pt vague on chest congestion when asked-pt stated she has intermittent chest congestion throughout the day. Pt states she doesn't feel any better.  Pt denies PND, CP, swelling, f/c/s.   [place] please advise.   No Known Allergies

## 2014-10-15 NOTE — Telephone Encounter (Signed)
Spoke with pt, she c/o SOB, prod cough with sometimes clear and sometimes beige mucus, decreased appetite, increased tiredness since last visit.  Has been using Dulera since it was prescribed at last visit on 10/8 and pt states it is not helping at all. Would like KC's recs.  [place] please advise.  Thank you!

## 2014-10-15 NOTE — Telephone Encounter (Signed)
At the last visit, she was having more shortness of breath and chest tightness, not chest congestion and increased mucus.  Therefore we treated her for airflow limitation, rather than infection.  Is she now saying she has congestion with purulent mucus?  Try and get an idea exactly what symptoms she is having.

## 2014-10-19 ENCOUNTER — Encounter: Payer: Self-pay | Admitting: Adult Health

## 2014-10-19 ENCOUNTER — Ambulatory Visit (INDEPENDENT_AMBULATORY_CARE_PROVIDER_SITE_OTHER): Payer: Medicare Other | Admitting: Adult Health

## 2014-10-19 ENCOUNTER — Ambulatory Visit (INDEPENDENT_AMBULATORY_CARE_PROVIDER_SITE_OTHER)
Admission: RE | Admit: 2014-10-19 | Discharge: 2014-10-19 | Disposition: A | Discharge status: Home / Self Care | Payer: Medicare Other | Source: Ambulatory Visit | Attending: Adult Health | Admitting: Adult Health

## 2014-10-19 VITALS — BP 116/74 | HR 85 | Temp 97.0°F | Ht 64.0 in | Wt 130.8 lb

## 2014-10-19 DIAGNOSIS — J479 Bronchiectasis, uncomplicated: Secondary | ICD-10-CM | POA: Diagnosis not present

## 2014-10-19 DIAGNOSIS — J449 Chronic obstructive pulmonary disease, unspecified: Secondary | ICD-10-CM | POA: Diagnosis not present

## 2014-10-19 DIAGNOSIS — R918 Other nonspecific abnormal finding of lung field: Secondary | ICD-10-CM | POA: Diagnosis not present

## 2014-10-19 NOTE — Patient Instructions (Signed)
Continue on Dulera 2 puffs Twice daily  , rinse after use.  Call back with formulary list to see what is covered in place of [facility]  If not covered .  Chest xray today .  Follow up Dr. Gwenette Greet in 3 months and As needed

## 2014-10-19 NOTE — Assessment & Plan Note (Signed)
Improving on Dulera  Check cxr today   Plan  Continue on Dulera 2 puffs Twice daily  , rinse after use.  Call back with formulary list to see what is covered in place of [facility]  If not covered .  Chest xray today .  Follow up Dr. Gwenette Greet in 3 months and As needed

## 2014-10-19 NOTE — Progress Notes (Signed)
68 yo female with known Bronchiectasis with MAC colonization. She also has mild air-trapping on prior PFTs.  10/19/2014 Follow up  Seen last visit with flare of cough and dyspnea.  Spirometry showed drop in FEV1 , started on Dulera  Does feel she is some better but still gets winded easily  We discussed with spirometry results and previous CT chest  Earlier this year that showed no sign of ILD but  Progressive changes >Diffuse bronchial wall thickening, with patchy areas of more significant thickening of the bronchi and peribronchovascular interstitium, typically associated with some cylindrical or mild varicose bronchiectasis, peribronchovascular architectural distortion and extensive peribronchovascular micro- and macronodularity. Extensive mucous plugging is seen scattered throughout the lungs bilaterally, most severe in the right middle lobe, right upper lobe and right lower lobe. She does not feel cough is flared. No fever or discolored mucus.    ROS:  Constitutional:   No  weight loss, night sweats,  Fevers, chills,  +fatigue, or  lassitude.  HEENT:   No headaches,  Difficulty swallowing,  Tooth/dental problems, or  Sore throat,                No sneezing, itching, ear ache, +nasal congestion, post nasal drip,   CV:  No chest pain,  Orthopnea, PND, swelling in lower extremities, anasarca, dizziness, palpitations, syncope.   GI  No heartburn, indigestion, abdominal pain, nausea, vomiting, diarrhea, change in bowel habits, loss of appetite, bloody stools.   Resp:    No chest wall deformity  Skin: no rash or lesions.  GU: no dysuria, change in color of urine, no urgency or frequency.  No flank pain, no hematuria   MS:  No joint pain or swelling.  No decreased range of motion.  No back pain.  Psych:  No change in mood or affect. No depression or anxiety.  No memory loss.    EXAM GEN: A/Ox3; pleasant , NAD, well nourished   HEENT:  [place]/AT,  EACs-clear, TMs-wnl, NOSE-clear,  THROAT-clear, no lesions, no postnasal drip or exudate noted.   NECK:  Supple w/ fair ROM; no JVD; normal carotid impulses w/o bruits; no thyromegaly or nodules palpated; no lymphadenopathy.  RESP   Decreased BS in bases  w/o, wheezes/ rales/ or rhonchi.no accessory muscle use, no dullness to percussion  CARD:  RRR, no m/r/g  , no peripheral edema, pulses intact, no cyanosis or clubbing.  GI:   Soft & nt; nml bowel sounds; no organomegaly or masses detected.  Musco: Warm bil, no deformities or joint swelling noted.   Neuro: alert, no focal deficits noted.    Skin: Warm, no lesions or rashes

## 2014-10-25 MED ORDER — MOMETASONE FURO-FORMOTEROL FUM 100-5 MCG/ACT IN AERO: 2.0000 | INHALATION_SPRAY | Freq: Two times a day (BID) | RESPIRATORY_TRACT | Status: DC

## 2014-10-25 NOTE — Addendum Note (Signed)
Addended by: Parke Poisson E on: 10/25/2014 02:01 PM   Modules accepted: Orders

## 2014-10-25 NOTE — Progress Notes (Signed)
Quick Note:  Called spoke with patient, advised of cxr results / recs as stated by TP. Pt verbalized her understanding and denied any questions. Appt scheduled with [facility] for 12.10.15 ______

## 2014-10-29 ENCOUNTER — Ambulatory Visit (INDEPENDENT_AMBULATORY_CARE_PROVIDER_SITE_OTHER): Payer: Medicare Other | Admitting: Pulmonary Disease

## 2014-10-29 ENCOUNTER — Encounter: Payer: Self-pay | Admitting: Pulmonary Disease

## 2014-10-29 VITALS — BP 122/60 | HR 88 | Temp 97.0°F | Ht 64.0 in | Wt 129.8 lb

## 2014-10-29 DIAGNOSIS — J479 Bronchiectasis, uncomplicated: Secondary | ICD-10-CM

## 2014-10-29 DIAGNOSIS — A310 Pulmonary mycobacterial infection: Secondary | ICD-10-CM | POA: Diagnosis not present

## 2014-10-29 DIAGNOSIS — J449 Chronic obstructive pulmonary disease, unspecified: Secondary | ICD-10-CM | POA: Diagnosis not present

## 2014-10-29 NOTE — Progress Notes (Signed)
   Subjective:    Patient ID: Kiara Medina, female    DOB: [birth year], 68 y.o.   MRN: [id number]  HPI The patient comes in today for follow-up of her known bronchiectasis, with COPD manifested primarily as air trapping. She also has known MAC on culture this year, as well as nodular infiltrates that I suspect are related. She has been having increasing pulmonary symptoms the last half of the year, and has been started on a LABA/ICS. She has seen some improvement in her dyspnea on exertion and sensation of air trapping. She has also had increasing chest congestion and cough with rattling as well, and her recent chest x-ray appears to show progressive infiltrates from her last exam earlier in the year. She is not having a lot of constitutional symptoms however.   Review of Systems  Constitutional: Negative for fever and unexpected weight change.  HENT: Positive for congestion and postnasal drip. Negative for dental problem, ear pain, nosebleeds, rhinorrhea, sinus pressure, sneezing, sore throat and trouble swallowing.   Eyes: Negative for redness and itching.  Respiratory: Positive for cough, chest tightness, shortness of breath and wheezing.   Cardiovascular: Negative for palpitations and leg swelling.  Gastrointestinal: Negative for nausea and vomiting.  Genitourinary: Negative for dysuria.  Musculoskeletal: Negative for joint swelling.  Skin: Negative for rash.  Neurological: Negative for headaches.  Hematological: Does not bruise/bleed easily.  Psychiatric/Behavioral: Negative for dysphoric mood. The patient is not nervous/anxious.        Objective:   Physical Exam Well-developed female in no acute distress Nose without purulence or discharge noted Neck without lymphadenopathy or thyromegaly Chest with fairly clear breath sounds, no wheezes or definite crackles Cardiac exam with regular rate and rhythm Lower extremities without edema, no cyanosis Alert and oriented, moves all 4  extremities.       Assessment & Plan:

## 2014-10-29 NOTE — Assessment & Plan Note (Signed)
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 Unfortunately, this will not be covered by her insurance, and will need to change to [facility] or Symbicort.  Given her symptoms and worsening FEV1 over the years, I think we need to treat for airflow limitation aggressively.

## 2014-10-29 NOTE — Patient Instructions (Signed)
Will change dulera to breo 100 for insurance purposes, and take one inhalation each am.  Rinse mouth well. Will refer to infectious disease clinic for possible treatment of your MAC given your worsening chest xray and increased symptoms.  followup with me again in 40mos, but I will receive a note from the infectious disease specialist and can review with you.

## 2014-10-29 NOTE — Assessment & Plan Note (Signed)
The patient has a history of MAC colonization, but now has progressive nodularity on chest CT and chest x-ray, as well as persistent cough, congestion, and mucus that is not responding to traditional antibiotics. I am concerned that her ongoing symptoms are related to her MAC, and that we should consider treating her for this organism. The patient understands the treatment regimen is lengthy and sometimes difficult, and I would like to refer her to the infectious disease specialist for their recommendations.

## 2014-11-01 DIAGNOSIS — Z6822 Body mass index (BMI) 22.0-22.9, adult: Secondary | ICD-10-CM | POA: Diagnosis not present

## 2014-11-01 DIAGNOSIS — J019 Acute sinusitis, unspecified: Secondary | ICD-10-CM | POA: Diagnosis not present

## 2014-11-05 ENCOUNTER — Other Ambulatory Visit: Payer: Self-pay | Admitting: Pulmonary Disease

## 2014-11-06 DIAGNOSIS — A310 Pulmonary mycobacterial infection: Secondary | ICD-10-CM | POA: Diagnosis not present

## 2014-11-06 DIAGNOSIS — Z6822 Body mass index (BMI) 22.0-22.9, adult: Secondary | ICD-10-CM | POA: Diagnosis not present

## 2014-11-06 DIAGNOSIS — R05 Cough: Secondary | ICD-10-CM | POA: Diagnosis not present

## 2014-11-06 DIAGNOSIS — J019 Acute sinusitis, unspecified: Secondary | ICD-10-CM | POA: Diagnosis not present

## 2014-11-14 ENCOUNTER — Ambulatory Visit (INDEPENDENT_AMBULATORY_CARE_PROVIDER_SITE_OTHER): Payer: Medicare Other | Admitting: Internal Medicine

## 2014-11-14 ENCOUNTER — Telehealth: Payer: Self-pay | Admitting: Pulmonary Disease

## 2014-11-14 ENCOUNTER — Encounter: Payer: Self-pay | Admitting: Internal Medicine

## 2014-11-14 VITALS — BP 129/79 | HR 111 | Temp 97.9°F | Ht 64.0 in | Wt 129.5 lb

## 2014-11-14 DIAGNOSIS — B0052 Herpesviral keratitis: Secondary | ICD-10-CM

## 2014-11-14 DIAGNOSIS — A310 Pulmonary mycobacterial infection: Secondary | ICD-10-CM

## 2014-11-14 DIAGNOSIS — E785 Hyperlipidemia, unspecified: Secondary | ICD-10-CM

## 2014-11-14 DIAGNOSIS — A312 Disseminated mycobacterium avium-intracellulare complex (DMAC): Secondary | ICD-10-CM | POA: Diagnosis not present

## 2014-11-14 LAB — COMPREHENSIVE METABOLIC PANEL
ALT: 22 U/L (ref 0–35)
AST: 29 U/L (ref 0–37)
Albumin: 3.6 g/dL (ref 3.5–5.2)
Alkaline Phosphatase: 70 U/L (ref 39–117)
BILIRUBIN TOTAL: 0.4 mg/dL (ref 0.2–1.2)
BUN: 17 mg/dL (ref 6–23)
CO2: 28 mEq/L (ref 19–32)
Calcium: 9 mg/dL (ref 8.4–10.5)
Chloride: 104 mEq/L (ref 96–112)
Creat: 0.95 mg/dL (ref 0.50–1.10)
Glucose, Bld: 101 mg/dL — ABNORMAL HIGH (ref 70–99)
Potassium: 3.4 mEq/L — ABNORMAL LOW (ref 3.5–5.3)
Sodium: 140 mEq/L (ref 135–145)
Total Protein: 6.3 g/dL (ref 6.0–8.3)

## 2014-11-14 MED ORDER — AZITHROMYCIN 250 MG PO TABS: 250.0000 mg | ORAL_TABLET | ORAL | Status: DC

## 2014-11-14 MED ORDER — ETHAMBUTOL HCL 400 MG PO TABS: 1600.0000 mg | ORAL_TABLET | ORAL | Status: DC

## 2014-11-14 MED ORDER — RIFAMPIN 300 MG PO CAPS: 600.0000 mg | ORAL_CAPSULE | ORAL | Status: DC

## 2014-11-14 MED ORDER — FLUTICASONE FUROATE-VILANTEROL 100-25 MCG/INH IN AEPB: 1.0000 | INHALATION_SPRAY | Freq: Every day | RESPIRATORY_TRACT | Status: DC

## 2014-11-14 NOTE — Telephone Encounter (Signed)
I spoke with the pt and she states at the last OV she was given Breo samples and advised to try it and if she likes it we can send in rx. Pt states it is working well and would [employer] rx sent to [facility] [place]. Rx sent. Pt aware. [place] Bing, CMA

## 2014-11-14 NOTE — Progress Notes (Signed)
Patient ID: Kiara Medina, female   DOB: 1946/01/09, 68 y.o.   MRN: [id number]         [facility]  Reason for Consult: Mycobacterium avium pneumonia Referring Physician: Dr. Lanny Hurst clamps Primary Care Physician: Dr. Nelda Bucks  Patient Active Problem List   Diagnosis Date Noted  . 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 COPD (chronic obstructive pulmonary disease) with chronic bronchitis 09/20/2014    Priority: Medium  . BRONCHIECTASIS 05/23/2009    Priority: Medium  . Dyslipidemia 11/14/2014  . ALLERGIC RHINITIS 05/03/2009    Patient's Medications  New Prescriptions   AZITHROMYCIN (ZITHROMAX) 250 MG TABLET    Take 1 tablet (250 mg total) by mouth 3 (three) times a week.   ETHAMBUTOL (MYAMBUTOL) 400 MG TABLET    Take 4 tablets (1,600 mg total) by mouth 3 (three) times a week.   RIFAMPIN (RIFADIN) 300 MG CAPSULE    Take 2 capsules (600 mg total) by mouth 3 (three) times a week.  Previous Medications   ACYCLOVIR (ZOVIRAX) 400 MG TABLET    Take 400 mg by mouth 2 (two) times daily.    ATORVASTATIN (LIPITOR) 20 MG TABLET    Take 20 mg by mouth daily.   BIOTIN 5000 PO    Take 2 capsules by mouth daily.   CALCIUM CARBONATE-VITAMIN D (CALCIUM-VITAMIN D3) 600-200 MG-UNIT TABS    Take 1 tablet by mouth 2 (two) times daily.     CHOLECALCIFEROL (VITAMIN D3) 1000 UNITS CAPS    Take 2,000 Units by mouth daily.   CYANOCOBALAMIN (VITAMIN B-12) 2500 MCG SUBL    Place under the tongue daily.     DOCUSATE CALCIUM (STOOL SOFTENER PO)    Take 100 mg by mouth 2 (two) times daily.    ESTRADIOL (ESTRACE) 1 MG TABLET    Take 1 mg by mouth daily.     FLAXSEED, LINSEED, (FLAX SEED OIL) 1300 MG CAPS    Take by mouth daily.   FLUTICASONE (FLONASE) 50 MCG/ACT NASAL SPRAY    Place 2 sprays into the nose as needed.     MOMETASONE-FORMOTEROL (DULERA) 100-5 MCG/ACT AERO    Inhale 2 puffs into the lungs 2 (two) times daily.   OMEPRAZOLE  (PRILOSEC) 40 MG CAPSULE    TAKE 1 CAPSULE (40 MG TOTAL) BY MOUTH DAILY.  Modified Medications   Modified Medication Previous Medication   FLUTICASONE FUROATE-VILANTEROL (BREO ELLIPTA) 100-25 MCG/INH AEPB Fluticasone Furoate-Vilanterol (BREO ELLIPTA) 100-25 MCG/INH AEPB      Inhale 1 Inhaler into the lungs daily.    Inhale 1 Inhaler into the lungs daily.  Discontinued Medications   OMEPRAZOLE (PRILOSEC) 40 MG CAPSULE    TAKE 1 CAPSULE (40 MG TOTAL) BY MOUTH DAILY.    Recommendations: 1. Sputum AFB stain and culture 2. CBC and CMP 3. Start azithromycin, ethambutol and rifampin 4. Follow-up in one month   Assessment: I suspect that her recent symptoms and worsening chest x-ray are due to Mycobacterium avium. I reviewed treatment guidelines with her and her family and she is interested in proceeding with therapy. I will obtain a repeat sputum specimen for AFB stain and culture and baseline labs today and then start azithromycin, ethambutol and rifampin to be taken every Monday, Wednesday and Friday. I talked to her about potential side effects. She is aware that the guidelines call for at least 12 months of antibiotic therapy.  HPI: Kiara Medina is a 68 y.o.  female who has bronchiectasis. She has had intermittent flares with acute on chronic bronchitis over the past several years. A sputum AFB culture in February 2012 grew Mycobacterium avium but it was not clear that this was causing symptomatic infection and she was not treated at that time. In March of this year she had a repeat culture which again grew Mycobacterium avium plus gram-negative rod. She had transient improvement with oral ciprofloxacin so the decision about treatment of the Mycobacterium avium was deferred. However, over the past few months she has noted worsening cough with more sputum production, some worsening of her chronic dyspnea on exertion, fatigue, anorexia and weight loss. A recent chest x-ray has shown some progression in  patchy bilateral infiltrates.  Review of Systems: Constitutional: positive for anorexia and weight loss, negative for chills, fevers and sweats Eyes: negative Ears, nose, mouth, throat, and face: negative Respiratory: positive for cough, dyspnea on exertion and sputum, negative for hemoptysis, pleurisy/chest pain and wheezing Cardiovascular: negative Gastrointestinal: negative Genitourinary:negative    Past Medical History  Diagnosis Date  . Sinusitis   . Bronchiectasis without acute exacerbation   . Cough   . Allergic rhinitis, cause unspecified     History  Substance Use Topics  . Smoking status: Former Smoker -- 1.00 packs/day for 20 years    Types: Cigarettes    Quit date: 12/14/1985  . Smokeless tobacco: Not on file  . Alcohol Use: Not on file    Family History  Problem Relation Age of Onset  . Emphysema Sister   . Stroke Mother   . Clotting disorder Mother    No Known Allergies  OBJECTIVE: Blood pressure 129/79, pulse 111, temperature 97.9 F (36.6 C), temperature source Oral, height 5\' 4"  (1.626 m), weight 129 lb 8 oz (58.741 kg). General: She is thin and in no distress. She is accompanied by her husband and daughter Skin: No rash Lungs: Scattered bilateral crackles Cor: Regular S1 and S2 with no murmur Abdomen: Soft and nontender   Microbiology: No results found for this or any previous visit (from the past 240 hour(s)).  Michel Bickers, MD [facility] [phone] pager   [phone] cell 11/14/2014, 5:23 PM

## 2014-11-15 ENCOUNTER — Telehealth: Payer: Self-pay | Admitting: Pulmonary Disease

## 2014-11-15 DIAGNOSIS — B0052 Herpesviral keratitis: Secondary | ICD-10-CM | POA: Insufficient documentation

## 2014-11-15 LAB — CBC
HCT: 44.9 % (ref 36.0–46.0)
HEMOGLOBIN: 14.8 g/dL (ref 12.0–15.0)
MCH: 30.3 pg (ref 26.0–34.0)
MCHC: 33 g/dL (ref 30.0–36.0)
MCV: 92 fL (ref 78.0–100.0)
MPV: 9.9 fL (ref 9.4–12.4)
Platelets: 261 10*3/uL (ref 150–400)
RBC: 4.88 MIL/uL (ref 3.87–5.11)
RDW: 14 % (ref 11.5–15.5)
WBC: 8.9 10*3/uL (ref 4.0–10.5)

## 2014-11-15 MED ORDER — ETHAMBUTOL HCL 400 MG PO TABS: 1600.0000 mg | ORAL_TABLET | ORAL | Status: DC

## 2014-11-15 MED ORDER — ACYCLOVIR 400 MG PO TABS: 400.0000 mg | ORAL_TABLET | Freq: Every day | ORAL | Status: DC

## 2014-11-15 MED ORDER — AZITHROMYCIN 250 MG PO TABS: 250.0000 mg | ORAL_TABLET | ORAL | Status: DC

## 2014-11-15 NOTE — Addendum Note (Signed)
Addended by: Michel Bickers on: 11/15/2014 01:33 PM   Modules accepted: Orders

## 2014-11-15 NOTE — Telephone Encounter (Signed)
Spoke with pt, states she went to pick up [facility] [address] [facility] was going to cost her $230 since [place] the [place]", and would have to turn around and pay that again 12/14/14.  Pt has enough samples to last her through the end of year, will fill rx at start of new year.  Nothing further needed at this time, just fyi.

## 2014-11-15 NOTE — Addendum Note (Signed)
Addended by: Lorne Skeens D on: 11/15/2014 09:08 AM   Modules accepted: Orders

## 2014-11-19 DIAGNOSIS — H2511 Age-related nuclear cataract, right eye: Secondary | ICD-10-CM | POA: Diagnosis not present

## 2014-11-19 DIAGNOSIS — Z79899 Other long term (current) drug therapy: Secondary | ICD-10-CM | POA: Diagnosis not present

## 2014-11-19 DIAGNOSIS — H43812 Vitreous degeneration, left eye: Secondary | ICD-10-CM | POA: Diagnosis not present

## 2014-11-19 DIAGNOSIS — Z87891 Personal history of nicotine dependence: Secondary | ICD-10-CM | POA: Diagnosis not present

## 2014-11-19 DIAGNOSIS — Z961 Presence of intraocular lens: Secondary | ICD-10-CM | POA: Diagnosis not present

## 2014-11-19 DIAGNOSIS — B009 Herpesviral infection, unspecified: Secondary | ICD-10-CM | POA: Diagnosis not present

## 2014-11-19 DIAGNOSIS — H11822 Conjunctivochalasis, left eye: Secondary | ICD-10-CM | POA: Diagnosis not present

## 2014-11-19 DIAGNOSIS — H5340 Unspecified visual field defects: Secondary | ICD-10-CM | POA: Diagnosis not present

## 2014-11-21 ENCOUNTER — Other Ambulatory Visit: Payer: Self-pay | Admitting: Internal Medicine

## 2014-11-21 ENCOUNTER — Other Ambulatory Visit: Payer: Medicare Other

## 2014-11-21 DIAGNOSIS — A310 Pulmonary mycobacterial infection: Secondary | ICD-10-CM | POA: Diagnosis not present

## 2014-11-21 DIAGNOSIS — A312 Disseminated mycobacterium avium-intracellulare complex (DMAC): Secondary | ICD-10-CM | POA: Diagnosis not present

## 2014-11-22 ENCOUNTER — Telehealth: Payer: Self-pay | Admitting: Licensed Clinical Social Worker

## 2014-11-22 ENCOUNTER — Ambulatory Visit: Payer: Medicare Other | Admitting: Pulmonary Disease

## 2014-11-22 NOTE — Telephone Encounter (Signed)
[facility] called with a critical sputum lab. Acid Fast Bacilli 4+

## 2014-11-24 DIAGNOSIS — Z310 Encounter for reversal of previous sterilization: Secondary | ICD-10-CM | POA: Diagnosis not present

## 2014-11-24 DIAGNOSIS — E785 Hyperlipidemia, unspecified: Secondary | ICD-10-CM | POA: Diagnosis not present

## 2014-11-24 DIAGNOSIS — R634 Abnormal weight loss: Secondary | ICD-10-CM | POA: Diagnosis not present

## 2014-11-24 DIAGNOSIS — Z0000 Encounter for general adult medical examination without abnormal findings: Secondary | ICD-10-CM | POA: Diagnosis not present

## 2014-11-24 DIAGNOSIS — Z23 Encounter for immunization: Secondary | ICD-10-CM | POA: Diagnosis not present

## 2014-11-24 DIAGNOSIS — E559 Vitamin D deficiency, unspecified: Secondary | ICD-10-CM | POA: Diagnosis not present

## 2014-12-10 IMAGING — CT CT CHEST HIGH RESOLUTION W/O CM
1 of 5 series · 4 of 36 positions shown, 5 images · non-contrast
Comparison: Chest CT 12/01/2010.

CLINICAL DATA: Worsening bronchiectasis. History of mycobacterial
infection. Increasing cough and shortness of breath.

EXAM:
CHEST CT WITHOUT CONTRAST
TECHNIQUE: Multidetector CT imaging of the chest was performed following the
standard protocol without intravenous contrast. High resolution
imaging of the lungs, as well as inspiratory and expiratory imaging,
was performed.

[Series 602: cor · coronal · 0.64mm/px · 4 of 95 slices shown, 5 images]
[im 19/95  mediastinal]
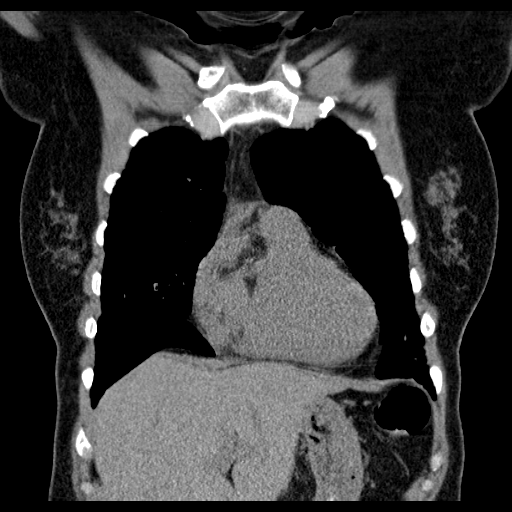
[im 19/95  lung]
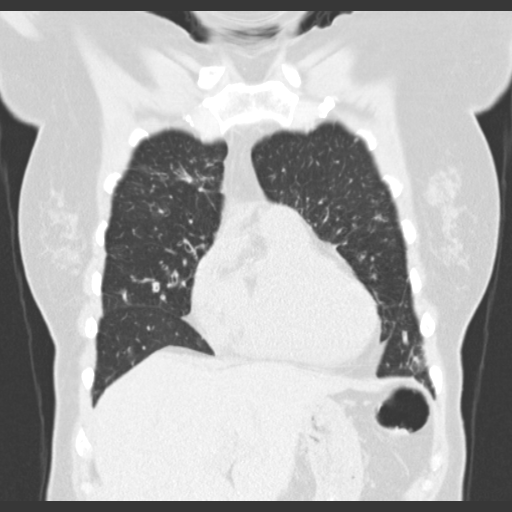
[im 38/95  lung]
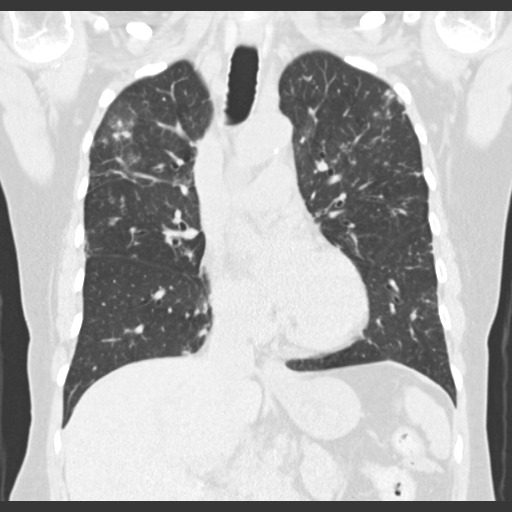
[im 57/95  lung]
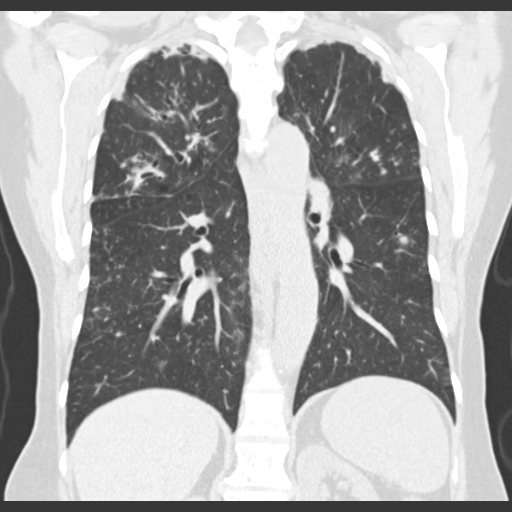
[im 76/95  lung]
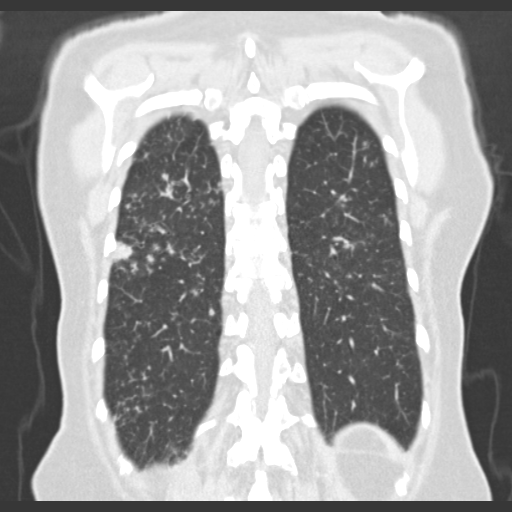

[4 of 36 positions shown; findings below may reference images not displayed]

FINDINGS: Mediastinum: Heart size is normal. There is no significant
pericardial fluid, thickening or pericardial calcification. No
pathologically enlarged mediastinal or hilar lymph nodes. Please
note that accurate exclusion of hilar adenopathy is limited on
noncontrast CT scans. Esophagus is unremarkable in appearance.

Lungs/Pleura: Diffuse bronchial wall thickening, with patchy areas
of more significant thickening of the bronchi and
peribronchovascular interstitium, typically associated with some
cylindrical or mild varicose bronchiectasis, peribronchovascular
architectural distortion and extensive peribronchovascular micro-
and macronodularity. Extensive mucous plugging is seen scattered
throughout the lungs bilaterally, most severe in the right middle
lobe, right upper lobe and right lower lobe. High-resolution images
demonstrate no compelling areas of subpleural reticulation,
parenchymal banding or honeycombing to suggest underlying
interstitial lung disease at this time. Inspiratory and expiratory
imaging is unremarkable. No confluent consolidative airspace
disease. No pleural effusions.

Upper Abdomen: Surgical clips in the gallbladder fossa compatible
with prior cholecystectomy. Otherwise, unremarkable.

Musculoskeletal: There are no aggressive appearing lytic or blastic
lesions noted in the visualized portions of the skeleton.
IMPRESSION: 1. The appearance of the lungs is again compatible with a chronic
indolent atypical infectious process such is BRUNEAU (mycobacterium
avium intracellulare). Overall, disease has slightly progressed
compared to the prior study from 12/01/2010.
2. The appearance the chest is not compatible with an interstitial
lung disease at this time.

## 2014-12-18 ENCOUNTER — Telehealth: Payer: Self-pay | Admitting: *Deleted

## 2014-12-18 NOTE — Telephone Encounter (Signed)
Sputum culture - Nocardia species, sending out for ID.  Sending message to Dr. Megan Salon.

## 2014-12-20 ENCOUNTER — Ambulatory Visit
Admission: RE | Admit: 2014-12-20 | Discharge: 2014-12-20 | Disposition: A | Discharge status: Home / Self Care | Payer: Medicare Other | Source: Ambulatory Visit | Attending: Internal Medicine | Admitting: Internal Medicine

## 2014-12-20 ENCOUNTER — Encounter: Payer: Self-pay | Admitting: Internal Medicine

## 2014-12-20 ENCOUNTER — Ambulatory Visit (INDEPENDENT_AMBULATORY_CARE_PROVIDER_SITE_OTHER): Payer: Medicare Other | Admitting: Internal Medicine

## 2014-12-20 DIAGNOSIS — A310 Pulmonary mycobacterial infection: Secondary | ICD-10-CM

## 2014-12-20 NOTE — Progress Notes (Signed)
Patient ID: Kiara Medina, female   DOB: Jan 30, 1946, 69 y.o.   MRN: [id number]         Kiara Medina for Infectious Disease  Patient Active Problem List   Diagnosis Date Noted  . 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 COPD (chronic obstructive pulmonary disease) with chronic bronchitis 09/20/2014    Priority: Medium  . BRONCHIECTASIS 05/23/2009    Priority: Medium  . 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 Dyslipidemia 11/14/2014  . ALLERGIC RHINITIS 05/03/2009    Patient's Medications  New Prescriptions   No medications on file  Previous Medications   ACYCLOVIR (ZOVIRAX) 400 MG TABLET    Take 1 tablet (400 mg total) by mouth 5 (five) times daily.   ATORVASTATIN (LIPITOR) 20 MG TABLET    Take 20 mg by mouth daily.   AZITHROMYCIN (ZITHROMAX) 250 MG TABLET    Take 1 tablet (250 mg total) by mouth 3 (three) times a week.   BIOTIN 5000 PO    Take 2 capsules by mouth daily.   CALCIUM CARBONATE-VITAMIN D (CALCIUM-VITAMIN D3) 600-200 MG-UNIT TABS    Take 1 tablet by mouth 2 (two) times daily.     CHOLECALCIFEROL (VITAMIN D3) 1000 UNITS CAPS    Take 2,000 Units by mouth daily.   CYANOCOBALAMIN (VITAMIN B-12) 2500 MCG SUBL    Place under the tongue daily.     DOCUSATE CALCIUM (STOOL SOFTENER PO)    Take 100 mg by mouth 2 (two) times daily.    ESTRADIOL (ESTRACE) 1 MG TABLET    Take 1 mg by mouth daily.     ETHAMBUTOL (MYAMBUTOL) 400 MG TABLET    Take 4 tablets (1,600 mg total) by mouth 3 (three) times a week.   FLAXSEED, LINSEED, (FLAX SEED OIL) 1300 MG CAPS    Take by mouth daily.   FLUTICASONE (FLONASE) 50 MCG/ACT NASAL SPRAY    Place 2 sprays into the nose as needed.     FLUTICASONE FUROATE-VILANTEROL (BREO ELLIPTA) 100-25 MCG/INH AEPB    Inhale 1 Inhaler into the lungs daily.   MOMETASONE-FORMOTEROL (DULERA) 100-5 MCG/ACT AERO    Inhale 2 puffs into the lungs 2 (two) times daily.   OMEPRAZOLE (PRILOSEC) 40 MG CAPSULE    TAKE 1 CAPSULE (40 MG TOTAL)  BY MOUTH DAILY.   RIFAMPIN (RIFADIN) 300 MG CAPSULE    Take 2 capsules (600 mg total) by mouth 3 (three) times a week.  Modified Medications   No medications on file  Discontinued Medications   No medications on file    Subjective: Kiara Medina is in for her routine follow-up visit. 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 She grew Mycobacterium avium from a bronchoscopy culture in February 2012. She was not treated at that time. A second bronchoscopy culture obtained in March of last year grew Mycobacterium avium again. Her chest x-ray in November showed worsening bilateral, nodular infiltrates prompting me to start her on therapy. I obtained a repeat sputum culture at the time of her last visit and that has grown nocardia species.  She notes that on the days that she takes her 3 drug antibiotic regimen she will occasionally have some mild nausea and diarrhea. She will also have some very mild headache. She does not have the symptoms on patient does not take the antibiotics. Though symptoms have not been getting worse over the last month.  She has noted marked improvement in her cough. She rarely produces any sputum and it is clear when she does. He no longer has any shortness of breath and is not requiring her nebulizer therapy. She also feels like she is sleeping better.  Review of Systems: Pertinent items are noted in HPI.  Past Medical History  Diagnosis Date  . Sinusitis   . Bronchiectasis without acute exacerbation   . Cough   . Allergic rhinitis, cause unspecified     History  Substance Use Topics  . Smoking status: Former Smoker -- 1.00 packs/day for 20 years    Types: Cigarettes    Quit date: 12/14/1985  . Smokeless tobacco: Not on file  . Alcohol Use: Not on file    Family History  Problem Relation Age of Onset  . Emphysema Sister   . Stroke Mother     . Clotting disorder Mother     No Known Allergies  Objective: Temp: 97.8 F (36.6 C) (01/07 1108) Temp Source: Oral (01/07 1108) BP: 128/81 mmHg (01/07 1108) Pulse Rate: 101 (01/07 1108)  General: Her weight is unchanged at 128 pounds. She is smiling and in good spirits. 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 She emphasizes that she feels markedly better since starting the antibiotics suggesting that her symptoms and worsening chest x-ray findings were due to Mycobacterium avium rather than nocardia. I will have her continue her current 3 drug regimen and repeat her chest x-ray today. I will send the nocardia isolate out for further testing (speciation and susceptibility testing) in case I need that information for future management.  Plan: 1. Continue azithromycin, ethambutol and rifampin 2. Repeat chest x-ray 3. Send the isolate for further testing   Michel Bickers, MD [facility] for [place] [phone] pager   [phone] cell 12/20/2014, 11:36 AM

## 2014-12-31 ENCOUNTER — Telehealth: Payer: Self-pay | Admitting: *Deleted

## 2014-12-31 NOTE — Telephone Encounter (Signed)
FYI, call from Luz Lex with [facility] lab on positive sputum culture for MAC and Nocardia [place]. Sample is being sent for sensitivities. Report faxed and placed in Dr. Hale Bogus box. Myrtis Hopping CMA

## 2015-01-01 LAB — REFERRED ASSAY

## 2015-01-09 DIAGNOSIS — Z79899 Other long term (current) drug therapy: Secondary | ICD-10-CM | POA: Diagnosis not present

## 2015-01-09 DIAGNOSIS — H43812 Vitreous degeneration, left eye: Secondary | ICD-10-CM | POA: Diagnosis not present

## 2015-01-10 DIAGNOSIS — M79645 Pain in left finger(s): Secondary | ICD-10-CM | POA: Diagnosis not present

## 2015-01-10 DIAGNOSIS — M65312 Trigger thumb, left thumb: Secondary | ICD-10-CM | POA: Diagnosis not present

## 2015-01-15 LAB — CP3650 SENSITIVITY M.AVIUM

## 2015-01-16 ENCOUNTER — Telehealth: Payer: Self-pay | Admitting: *Deleted

## 2015-01-16 LAB — AFB CULTURE WITH SMEAR (NOT AT ARMC)

## 2015-01-16 NOTE — Telephone Encounter (Signed)
Nocardia nova sensitivities received. Placed in Dr. Hale Bogus box. Landis Gandy, RN

## 2015-01-22 ENCOUNTER — Encounter: Payer: Self-pay | Admitting: Internal Medicine

## 2015-01-22 ENCOUNTER — Ambulatory Visit (INDEPENDENT_AMBULATORY_CARE_PROVIDER_SITE_OTHER): Payer: Medicare Other | Admitting: Internal Medicine

## 2015-01-22 DIAGNOSIS — A310 Pulmonary mycobacterial infection: Secondary | ICD-10-CM

## 2015-01-22 NOTE — Progress Notes (Signed)
Patient ID: Kiara Medina, female   DOB: 1946/07/29, 69 y.o.   MRN: [id number]         [facility] for Infectious Disease  Patient Active Problem List   Diagnosis Date Noted  . 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 COPD (chronic obstructive pulmonary disease) with chronic bronchitis 09/20/2014    Priority: Medium  . BRONCHIECTASIS 05/23/2009    Priority: Medium  . 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 Dyslipidemia 11/14/2014  . ALLERGIC RHINITIS 05/03/2009    Patient's Medications  New Prescriptions   No medications on file  Previous Medications   ACYCLOVIR (ZOVIRAX) 400 MG TABLET    Take 1 tablet (400 mg total) by mouth 5 (five) times daily.   ATORVASTATIN (LIPITOR) 20 MG TABLET    Take 20 mg by mouth daily.   AZITHROMYCIN (ZITHROMAX) 250 MG TABLET    Take 1 tablet (250 mg total) by mouth 3 (three) times a week.   BIOTIN 5000 PO    Take 2 capsules by mouth daily.   CALCIUM CARBONATE-VITAMIN D (CALCIUM-VITAMIN D3) 600-200 MG-UNIT TABS    Take 1 tablet by mouth 2 (two) times daily.     CHOLECALCIFEROL (VITAMIN D3) 1000 UNITS CAPS    Take 2,000 Units by mouth daily.   CYANOCOBALAMIN (VITAMIN B-12) 2500 MCG SUBL    Place under the tongue daily.     DOCUSATE CALCIUM (STOOL SOFTENER PO)    Take 100 mg by mouth 2 (two) times daily.    ESTRADIOL (ESTRACE) 1 MG TABLET    Take 1 mg by mouth daily.     ETHAMBUTOL (MYAMBUTOL) 400 MG TABLET    Take 4 tablets (1,600 mg total) by mouth 3 (three) times a week.   FLAXSEED, LINSEED, (FLAX SEED OIL) 1300 MG CAPS    Take by mouth daily.   FLUTICASONE (FLONASE) 50 MCG/ACT NASAL SPRAY    Place 2 sprays into the nose as needed.     FLUTICASONE FUROATE-VILANTEROL (BREO ELLIPTA) 100-25 MCG/INH AEPB    Inhale 1 Inhaler into the lungs daily.   OMEPRAZOLE (PRILOSEC) 40 MG CAPSULE    TAKE 1 CAPSULE (40 MG TOTAL) BY MOUTH DAILY.   PROBIOTIC PRODUCT (PROBIOTIC DAILY PO)    Take 1 capsule by mouth daily.   RIFAMPIN  (RIFADIN) 300 MG CAPSULE    Take 2 capsules (600 mg total) by mouth 3 (three) times a week.  Modified Medications   No medications on file  Discontinued Medications   MOMETASONE-FORMOTEROL (DULERA) 100-5 MCG/ACT AERO    Inhale 2 puffs into the lungs 2 (two) times daily.    Subjective: Kiara Medina is in for her routine follow-up visit for her Mycobacterium avium pneumonia. She has now completed 2 months of therapy with azithromycin, ethambutol and rifampin. She is taking her medications every Monday, Wednesday and Friday. She will occasionally have some loose stools and mild nausea without vomiting on the day she takes her medication but overall feels like she is tolerating it well. Her appetite is not back to normal but it is better. Her cough is improved significantly and she feels that his back to her baseline. She has no shortness of breath or fever. Review of Systems: Pertinent items are noted in HPI.  Past Medical History  Diagnosis Date  . Sinusitis   . Bronchiectasis without acute exacerbation   . Cough   . Allergic rhinitis, cause unspecified     History  Substance Use Topics  .  Smoking status: Former Smoker -- 1.00 packs/day for 20 years    Types: Cigarettes    Quit date: 12/14/1985  . Smokeless tobacco: Not on file  . Alcohol Use: Not on file    Family History  Problem Relation Age of Onset  . Emphysema Sister   . Stroke Mother   . Clotting disorder Mother     No Known Allergies  Objective: Temp: 97.8 F (36.6 C) (02/09 1031) Temp Source: Oral (02/09 1031) BP: 131/77 mmHg (02/09 1031) Pulse Rate: 90 (02/09 1031)  General: She is smiling and in good spirits Lungs: Clear without crackles or wheezes Cor: Regular S1 and S2 with no murmur  CHEST 2 VIEW 12/20/2014  COMPARISON: 10/19/2014.  FINDINGS: Mediastinum and hilar structures normal. Stable cardiomegaly. Improvement of pulmonary interstitial nodular infiltrates with prominent residual. No pleural  effusion or pneumothorax. No acute bony abnormality. Surgical clips right upper quadrant.  IMPRESSION: Persistent bilateral interstitial nodular pulmonary infiltrates with some interval improvement from prior exam . Continued follow-up chest x-rays suggested.   Electronically Signed  By: Marcello Moores Register  On: 12/20/2014 14:16   Assessment: She is improving on therapy for Mycobacterium avium pneumonia. Her sputum culture 2 months ago also grew nocardia [place] which would not respond to her current antibiotic regimen. That strongly suggested that is an asymptomatic colonizer.  Plan: 1. Continue Mycobacterium avium therapy 2. Repeat sputum AFB 3. Follow-up in 3 months with lab work at that time   Michel Bickers, [place] for [place] [phone] pager   [phone] cell 01/22/2015, 11:04 AM

## 2015-01-25 ENCOUNTER — Ambulatory Visit: Payer: Medicare Other | Admitting: Pulmonary Disease

## 2015-01-25 LAB — REFERRED ASSAY

## 2015-01-30 ENCOUNTER — Encounter: Payer: Self-pay | Admitting: Internal Medicine

## 2015-02-11 ENCOUNTER — Telehealth: Payer: Self-pay | Admitting: *Deleted

## 2015-02-11 NOTE — Telephone Encounter (Signed)
Patient called asking for advice. She has noticed deep, throbbing and aching pain in her calves and hamstrings, occasionally in her arms/shoulders and back for the last few weeks. She rates this at 5-6/10. The arm/shoulder/back pain alleviates with movement, but the leg pain does not.  It is most intense at night.  She denies any edema, redness or streaking.  She would like to know if this is a side effect to her MAC regimen?  She has not taken non-prescriptive pain medication (ibuprofen, tylenol), but will try if appropriate.  Ben-gay and Icy Hot topicals have not helped. She is concerned about the risk of using NSAIDs with her MAC regimen.   Please advise. Landis Gandy, RN

## 2015-02-13 NOTE — Telephone Encounter (Signed)
I spoke to Ms. Kiara Medina by phone today. Over the past few weeks she's developed some deep, aching pain in the back of her left leg and less often in her arms and shoulders and back. It is most noticeable after she has been sitting for a while or when she is resting quietly in bed. When she is up moving about she does not notice the pain. She has had some prior arthritis pain in her hands but has never had pain like this before. She has not had any fever, chills or sweats. She is having so little cough now that she has been unable to obtain a sputum sample for repeat AFB culture. She continues on her 3 drug regimen for Mycobacterium avium pneumonia. She was concerned that her pain might be related to her antibiotics. I let her know that I doubted that the pain was related to her antibiotics. She stated that she would see if she did get an appointment with her primary care physician, Kiara Medina, for further evaluation. I did let her know that if she or Dr. Delena Medina continues to have concerns about the medication that we could have her take a one to two-week drug holiday to see if her pain improves. I asked her to call me if she has any further concerns between now and her next follow-up visit.

## 2015-03-26 ENCOUNTER — Other Ambulatory Visit: Payer: Self-pay | Admitting: Obstetrics and Gynecology

## 2015-03-26 DIAGNOSIS — R87629 Unspecified abnormal cytological findings in specimens from vagina: Secondary | ICD-10-CM | POA: Diagnosis not present

## 2015-03-26 DIAGNOSIS — N951 Menopausal and female climacteric states: Secondary | ICD-10-CM | POA: Diagnosis not present

## 2015-03-26 DIAGNOSIS — Z1231 Encounter for screening mammogram for malignant neoplasm of breast: Secondary | ICD-10-CM | POA: Diagnosis not present

## 2015-03-27 LAB — CYTOLOGY - PAP

## 2015-04-12 DIAGNOSIS — M65312 Trigger thumb, left thumb: Secondary | ICD-10-CM | POA: Diagnosis not present

## 2015-04-12 DIAGNOSIS — M79641 Pain in right hand: Secondary | ICD-10-CM | POA: Diagnosis not present

## 2015-04-23 ENCOUNTER — Ambulatory Visit: Payer: Medicare Other | Admitting: Internal Medicine

## 2015-04-23 ENCOUNTER — Ambulatory Visit (INDEPENDENT_AMBULATORY_CARE_PROVIDER_SITE_OTHER): Payer: Medicare Other | Admitting: Internal Medicine

## 2015-04-23 ENCOUNTER — Encounter: Payer: Self-pay | Admitting: Internal Medicine

## 2015-04-23 DIAGNOSIS — A310 Pulmonary mycobacterial infection: Secondary | ICD-10-CM | POA: Diagnosis not present

## 2015-04-23 DIAGNOSIS — A312 Disseminated mycobacterium avium-intracellulare complex (DMAC): Secondary | ICD-10-CM | POA: Diagnosis not present

## 2015-04-23 LAB — COMPREHENSIVE METABOLIC PANEL
ALT: 15 U/L (ref 0–35)
AST: 23 U/L (ref 0–37)
Albumin: 3.8 g/dL (ref 3.5–5.2)
Alkaline Phosphatase: 55 U/L (ref 39–117)
BUN: 15 mg/dL (ref 6–23)
CHLORIDE: 104 meq/L (ref 96–112)
CO2: 27 meq/L (ref 19–32)
CREATININE: 0.69 mg/dL (ref 0.50–1.10)
Calcium: 9.6 mg/dL (ref 8.4–10.5)
Glucose, Bld: 90 mg/dL (ref 70–99)
Potassium: 4.1 mEq/L (ref 3.5–5.3)
Sodium: 140 mEq/L (ref 135–145)
Total Bilirubin: 0.6 mg/dL (ref 0.2–1.2)
Total Protein: 6.3 g/dL (ref 6.0–8.3)

## 2015-04-23 LAB — CBC
HEMATOCRIT: 45.7 % (ref 36.0–46.0)
HEMOGLOBIN: 15.4 g/dL — AB (ref 12.0–15.0)
MCH: 32.2 pg (ref 26.0–34.0)
MCHC: 33.7 g/dL (ref 30.0–36.0)
MCV: 95.4 fL (ref 78.0–100.0)
MPV: 10.7 fL (ref 8.6–12.4)
PLATELETS: 153 10*3/uL (ref 150–400)
RBC: 4.79 MIL/uL (ref 3.87–5.11)
RDW: 14.1 % (ref 11.5–15.5)
WBC: 5.3 10*3/uL (ref 4.0–10.5)

## 2015-04-23 NOTE — Progress Notes (Signed)
Patient ID: Kiara Medina, female   DOB: [birth year], 69 y.o.   MRN: [id number]         [facility] for Infectious Disease  Patient Active Problem List   Diagnosis Date Noted  . 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 COPD (chronic obstructive pulmonary disease) with chronic bronchitis 09/20/2014    Priority: Medium  . BRONCHIECTASIS 05/23/2009    Priority: Medium  . 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 Dyslipidemia 11/14/2014  . ALLERGIC RHINITIS 05/03/2009    Patient's Medications  New Prescriptions   No medications on file  Previous Medications   ACYCLOVIR (ZOVIRAX) 400 MG TABLET    Take 1 tablet (400 mg total) by mouth 5 (five) times daily.   ATORVASTATIN (LIPITOR) 20 MG TABLET    Take 20 mg by mouth daily.   AZITHROMYCIN (ZITHROMAX) 250 MG TABLET    Take 1 tablet (250 mg total) by mouth 3 (three) times a week.   BIOTIN 5000 PO    Take 2 capsules by mouth daily.   CALCIUM CARBONATE-VITAMIN D (CALCIUM-VITAMIN D3) 600-200 MG-UNIT TABS    Take 1 tablet by mouth 2 (two) times daily.     CHOLECALCIFEROL (VITAMIN D3) 1000 UNITS CAPS    Take 2,000 Units by mouth daily.   CYANOCOBALAMIN (VITAMIN B-12) 2500 MCG SUBL    Place under the tongue daily.     DOCUSATE CALCIUM (STOOL SOFTENER PO)    Take 100 mg by mouth 2 (two) times daily.    ESTRADIOL (ESTRACE) 1 MG TABLET    Take 1 mg by mouth daily.     ETHAMBUTOL (MYAMBUTOL) 400 MG TABLET    Take 4 tablets (1,600 mg total) by mouth 3 (three) times a week.   FLAXSEED, LINSEED, (FLAX SEED OIL) 1300 MG CAPS    Take by mouth daily.   FLUTICASONE (FLONASE) 50 MCG/ACT NASAL SPRAY    Place 2 sprays into the nose as needed.     FLUTICASONE FUROATE-VILANTEROL (BREO ELLIPTA) 100-25 MCG/INH AEPB    Inhale 1 Inhaler into the lungs daily.   OMEPRAZOLE (PRILOSEC) 40 MG CAPSULE    TAKE 1 CAPSULE (40 MG TOTAL) BY MOUTH DAILY.   PROBIOTIC PRODUCT (PROBIOTIC DAILY PO)    Take 1 capsule by mouth daily.   RIFAMPIN  (RIFADIN) 300 MG CAPSULE    Take 2 capsules (600 mg total) by mouth 3 (three) times a week.  Modified Medications   No medications on file  Discontinued Medications   No medications on file    Subjective: Ms. Reedy is in for her routine follow-up for her Mycobacterium avium pneumonia. She is now completed 5 months of therapy with azithromycin, ethambutol and rifampin. She takes her antibiotics every Monday, Wednesday and Friday. She will occasionally have some very mild diarrhea on the day she takes her antibiotics but otherwise is tolerating them well. She has no nausea, vomiting or abdominal pain. She's had no change in her appetite or weight. She has had no change in her vision.  She is no longer bothered by dry cough. She is not producing any sputum and was not able to bring the specimen and for repeat AFB cultures after her last visit. She will occasionally note some dyspnea on exertion but this is very mild, chronic and unchanged.  Review of Systems: Pertinent items are noted in HPI.  Past Medical History  Diagnosis Date  . Sinusitis   . Bronchiectasis without acute exacerbation   . Cough   .  Allergic rhinitis, cause unspecified     History  Substance Use Topics  . Smoking status: Former Smoker -- 1.00 packs/day for 20 years    Types: Cigarettes    Quit date: 12/14/1985  . Smokeless tobacco: Not on file  . Alcohol Use: Not on file    Family History  Problem Relation Age of Onset  . Emphysema Sister   . Stroke Mother   . Clotting disorder Mother     No Known Allergies  Objective: Temp: 98.1 F (36.7 C) (05/10 1020) Temp Source: Oral (05/10 1020) BP: 134/78 mmHg (05/10 1020) Pulse Rate: 85 (05/10 1020)  General: She is smiling and in good spirits Skin: No rash Lungs: Clear without crackles or wheezes Cor: Regular S1 and S2 with no murmurs    Assessment: She is doing very well on therapy for Mycobacterium avium pneumonia. I plan on 7 more months of  therapy.  Plan: 1. Continue azithromycin, ethambutol and rifampin 2. Check CBC and complete metabolic panel today 3. Follow-up in 3 months   Michel Bickers, MD [facility] for [place] [phone] pager   [phone] cell 04/23/2015, 10:36 AM

## 2015-04-27 ENCOUNTER — Other Ambulatory Visit: Payer: Self-pay | Admitting: Pulmonary Disease

## 2015-04-29 ENCOUNTER — Encounter: Payer: Self-pay | Admitting: Pulmonary Disease

## 2015-04-29 ENCOUNTER — Ambulatory Visit (INDEPENDENT_AMBULATORY_CARE_PROVIDER_SITE_OTHER): Payer: Medicare Other | Admitting: Pulmonary Disease

## 2015-04-29 VITALS — BP 124/74 | HR 75 | Temp 97.7°F | Ht 64.0 in | Wt 134.2 lb

## 2015-04-29 DIAGNOSIS — J479 Bronchiectasis, uncomplicated: Secondary | ICD-10-CM

## 2015-04-29 DIAGNOSIS — J449 Chronic obstructive pulmonary disease, unspecified: Secondary | ICD-10-CM

## 2015-04-29 DIAGNOSIS — A310 Pulmonary mycobacterial infection: Secondary | ICD-10-CM

## 2015-04-29 NOTE — Assessment & Plan Note (Signed)
The patient is doing very well on her 3 drug therapy for MAC, and has seen resolution of her cough and some increased weight. She tells me that she feels the best she has in years. The plan is to continue on the meds for a total of one year, and then reevaluate.

## 2015-04-29 NOTE — Telephone Encounter (Signed)
Patient has appt today at 10 am with [facility].

## 2015-04-29 NOTE — Progress Notes (Signed)
   Subjective:    Patient ID: Kiara Medina, female    DOB: [birth year], 69 y.o.   MRN: [id number]  HPI The patient comes in today for follow-up of her multiple pulmonary issues. She has a history of bronchiectasis with minimal airflow obstruction, as well as MAC. She was having progressive cough, mild weight loss, and progressive chest x-ray changes, and was started on a 3 drug regimen through the infectious disease clinic for treatment. She has been on this for 6 months now, and has done extremely well. He has had resolution of her cough, and has gained back a little bit of weight. She tells me that she feels well. She did not see a difference with a bronchodilator regimen, and currently feels that her breathing is well.   Review of Systems  Constitutional: Negative for fever and unexpected weight change.  HENT: Negative for congestion, dental problem, ear pain, nosebleeds, postnasal drip, rhinorrhea, sinus pressure, sneezing, sore throat and trouble swallowing.   Eyes: Negative for redness and itching.  Respiratory: Negative for cough, chest tightness, shortness of breath and wheezing.   Cardiovascular: Negative for palpitations and leg swelling.  Gastrointestinal: Negative for nausea and vomiting.  Genitourinary: Negative for dysuria.  Musculoskeletal: Negative for joint swelling.  Skin: Negative for rash.  Neurological: Negative for headaches.  Hematological: Does not bruise/bleed easily.  Psychiatric/Behavioral: Negative for dysphoric mood. The patient is not nervous/anxious.        Objective:   Physical Exam Well-developed female in no acute distress Nose without purulence or discharge noted Neck without lymphadenopathy or thyromegaly Chest with totally clear breath sounds, no crackles or rhonchi Cardiac exam with regular rate and rhythm Lower extremities without edema, no cyanosis Alert and oriented, moves all 4 extremities.        Assessment & Plan:

## 2015-04-29 NOTE — Patient Instructions (Signed)
Continue to stay active Keep on your medications for your lung infection. followup again in 61mos with Dr. Lake Bells.

## 2015-04-29 NOTE — Assessment & Plan Note (Signed)
The patient has minimal COPD primarily manifested as air trapping, and saw no significant difference in her breathing with a LABA/ICS. She is satisfied at this point with her breathing and her exertional tolerance.

## 2015-06-12 DIAGNOSIS — M25512 Pain in left shoulder: Secondary | ICD-10-CM | POA: Diagnosis not present

## 2015-07-24 ENCOUNTER — Ambulatory Visit (INDEPENDENT_AMBULATORY_CARE_PROVIDER_SITE_OTHER): Payer: Medicare Other | Admitting: Internal Medicine

## 2015-07-24 ENCOUNTER — Encounter: Payer: Self-pay | Admitting: Internal Medicine

## 2015-07-24 VITALS — BP 128/80 | HR 78 | Temp 98.0°F | Wt 136.5 lb

## 2015-07-24 DIAGNOSIS — A310 Pulmonary mycobacterial infection: Secondary | ICD-10-CM

## 2015-07-24 NOTE — Progress Notes (Signed)
[facility] for Infectious Disease  Patient Active Problem List   Diagnosis Date Noted  . 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 COPD (chronic obstructive pulmonary disease) with chronic bronchitis 09/20/2014    Priority: Medium  . BRONCHIECTASIS 05/23/2009    Priority: Medium  . 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 Dyslipidemia 11/14/2014  . ALLERGIC RHINITIS 05/03/2009    Patient's Medications  New Prescriptions   No medications on file  Previous Medications   ACYCLOVIR (ZOVIRAX) 400 MG TABLET    Take 1 tablet (400 mg total) by mouth 5 (five) times daily.   ATORVASTATIN (LIPITOR) 20 MG TABLET    Take 20 mg by mouth daily.   AZITHROMYCIN (ZITHROMAX) 250 MG TABLET    Take 1 tablet (250 mg total) by mouth 3 (three) times a week.   BIOTIN 5000 PO    Take 2 capsules by mouth daily.   CALCIUM CARBONATE-VITAMIN D (CALCIUM-VITAMIN D3) 600-200 MG-UNIT TABS    Take 1 tablet by mouth 2 (two) times daily.     CHOLECALCIFEROL (VITAMIN D3) 1000 UNITS CAPS    Take 2,000 Units by mouth daily.   CYANOCOBALAMIN (VITAMIN B-12) 2500 MCG SUBL    Place under the tongue daily.     DOCUSATE CALCIUM (STOOL SOFTENER PO)    Take 100 mg by mouth 2 (two) times daily.    ESTRADIOL (ESTRACE) 1 MG TABLET    Take 1 mg by mouth daily.     ETHAMBUTOL (MYAMBUTOL) 400 MG TABLET    Take 4 tablets (1,600 mg total) by mouth 3 (three) times a week.   FLAXSEED, LINSEED, (FLAX SEED OIL) 1300 MG CAPS    Take by mouth daily.   OMEPRAZOLE (PRILOSEC) 40 MG CAPSULE    TAKE ONE CAPSULE BY MOUTH EVERY DAY   PROBIOTIC PRODUCT (PROBIOTIC DAILY PO)    Take 1 capsule by mouth daily.   RIFAMPIN (RIFADIN) 300 MG CAPSULE    Take 2 capsules (600 mg total) by mouth 3 (three) times a week.   TURMERIC PO    Take 450 mg by mouth 2 (two) times daily.  Modified Medications   No medications on file  Discontinued Medications   No medications on file    Subjective: Ms. Sanon is in for  her routine follow-up visit. She is now been on azithromycin, ethambutol and rifampin for 8 months as treatment for her Mycobacterium avium pneumonia. She is feeling much better. She has no cough. She has mild dyspnea on exertion which is her baseline. She has had some problem with sinus congestion recently that she attributes to seasonal allergies. She has not had any fever, chills or sweats. She has regained most of the weight that she lost before she started treatment. Her appetite is good and she does not have any nausea or vomiting. She has not had any visual change.  Review of Systems: Pertinent items are noted in HPI.  Past Medical History  Diagnosis Date  . Sinusitis   . Bronchiectasis without acute exacerbation   . Cough   . Allergic rhinitis, cause unspecified     Social History  Substance Use Topics  . Smoking status: Former Smoker -- 1.00 packs/day for 20 years    Types: Cigarettes    Quit date: 12/14/1985  . Smokeless tobacco: None  . Alcohol Use: None    Family History  Problem Relation Age of Onset  . Emphysema Sister   .  Stroke Mother   . Clotting disorder Mother     No Known Allergies  Objective: Filed Vitals:   07/24/15 1341  BP: 128/80  Pulse: 78  Temp: 98 F (36.7 C)  TempSrc: Oral  Weight: 136 lb 8 oz (61.916 kg)   Body mass index is 23.42 kg/(m^2).  General: Her weight is up to 136 pounds Skin: No rash Lungs: Clear Cor: Regular S1 and S2 with no murmurs  Lab Results Lab Results  Component Value Date   WBC 5.3 04/23/2015   HGB 15.4* 04/23/2015   HCT 45.7 04/23/2015   MCV 95.4 04/23/2015   PLT 153 04/23/2015   CMP     Component Value Date/Time   NA 140 04/23/2015 1057   K 4.1 04/23/2015 1057   CL 104 04/23/2015 1057   CO2 27 04/23/2015 1057   GLUCOSE 90 04/23/2015 1057   BUN 15 04/23/2015 1057   CREATININE 0.69 04/23/2015 1057   CREATININE 0.59 06/01/2011 1457   CALCIUM 9.6 04/23/2015 1057   PROT 6.3 04/23/2015 1057   ALBUMIN  3.8 04/23/2015 1057   AST 23 04/23/2015 1057   ALT 15 04/23/2015 1057   ALKPHOS 55 04/23/2015 1057   BILITOT 0.6 04/23/2015 1057   GFRNONAA >60 06/01/2011 1457   GFRAA >60 06/01/2011 1457      Problem List Items Addressed This Visit      High   MAC (mycobacterium avium-intracellulare complex) - Primary    She has responded extremely well to therapy for Mycobacterium avium pneumonia. I will plan on another 4 months of therapy.          Michel Bickers, MD [facility] for [facility] [phone] pager   [phone] cell 07/24/2015, 2:01 PM

## 2015-07-24 NOTE — Assessment & Plan Note (Signed)
She has responded extremely well to therapy for Mycobacterium avium pneumonia. I will plan on another 4 months of therapy.

## 2015-09-12 ENCOUNTER — Ambulatory Visit
Admission: RE | Admit: 2015-09-12 | Discharge: 2015-09-12 | Disposition: A | Discharge status: Home / Self Care | Payer: Medicare Other | Source: Ambulatory Visit | Attending: Pulmonary Disease | Admitting: Pulmonary Disease

## 2015-09-12 ENCOUNTER — Encounter: Payer: Self-pay | Admitting: Pulmonary Disease

## 2015-09-12 ENCOUNTER — Ambulatory Visit (INDEPENDENT_AMBULATORY_CARE_PROVIDER_SITE_OTHER): Payer: Medicare Other | Admitting: Pulmonary Disease

## 2015-09-12 VITALS — BP 124/66 | HR 91 | Ht 64.0 in | Wt 134.0 lb

## 2015-09-12 DIAGNOSIS — R0602 Shortness of breath: Secondary | ICD-10-CM | POA: Diagnosis not present

## 2015-09-12 DIAGNOSIS — R059 Cough, unspecified: Secondary | ICD-10-CM

## 2015-09-12 DIAGNOSIS — R05 Cough: Secondary | ICD-10-CM

## 2015-09-12 DIAGNOSIS — J449 Chronic obstructive pulmonary disease, unspecified: Secondary | ICD-10-CM | POA: Diagnosis not present

## 2015-09-12 DIAGNOSIS — A310 Pulmonary mycobacterial infection: Secondary | ICD-10-CM | POA: Diagnosis not present

## 2015-09-12 MED ORDER — PREDNISONE 10 MG PO TABS: 20.0000 mg | ORAL_TABLET | Freq: Every day | ORAL | Status: AC

## 2015-09-12 MED ORDER — AMOXICILLIN-POT CLAVULANATE 875-125 MG PO TABS: 1.0000 | ORAL_TABLET | Freq: Two times a day (BID) | ORAL | Status: DC

## 2015-09-12 NOTE — Assessment & Plan Note (Signed)
She is having an exacerbation of her chronic bronchiectasis. I'm hopeful that this is just a viral or an acute on chronic bacterial problem rather than related to her mycobacterial disease.  Plan: Check a chest x-ray to ensure there is no evidence of pneumonia Short prednisone burst Continue 3 drug therapy for MAI Augmentin 5 days Sputum culture add Symbicort for the next 2 weeks

## 2015-09-12 NOTE — Progress Notes (Signed)
Subjective:    Patient ID: Kiara Medina, female    DOB: 02/06/1946, 69 y.o.   MRN: [id number]  Synopsis: Former patient of Dr. Gwenette Greet who has MAI and COPD.  Has been treated since December 2015 as guided by Dr. Megan Salon and infectious diseases. Smoked 1ppd for 25 years, quit 1987 Summarized by Dr. Gwenette Greet as follows: +culture 2015 Sensitivities listed under "media" tab. Followed by ID >> on 3 drug therapy since 11/2014.  Plan is for one year.  Normal ratio, but +airtrapping. Arlyce Harman 2010:  FVC 2.61 (87%), FEV1 2.11 (97%), ratio 81 Spiro 2015:  FVC 1.86 (65%), FEV1 1.34 (61%), ratio 72 Tried LABA/ICS with no significant change in breathing.  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  Arlyce Harman 01/2014:  No obstruction by FEV1%, ?mild airtrapping with decreased FVC +MAC on sputum culture 02/2014. >> sensitive to clarithro, ethamb, INH  HPI Chief Complaint  Patient presents with  . Acute Visit    former [facility] pt being treated for [facility].  pt c/o sob, nonprod cough X1 week.  states she feels like she did before starting her abx for [facility] therapy.     Worsening cough, dyspnea, mucus production for for the last week.  Had been [place] a wine [place], no sick contacts.  She has more dyspnea this week. No chest pain. Her sister was sick last week, but she wsn't around her. No fever, chills, no weight loss. No chest pain. Had been feeling   Past Medical History  Diagnosis Date  . Sinusitis   . Bronchiectasis without acute exacerbation   . Cough   . Allergic rhinitis, cause unspecified       Review of Systems  Constitutional: Positive for fatigue. Negative for fever and chills.  HENT: Negative for nosebleeds, postnasal drip and rhinorrhea.   Respiratory: Positive for cough, shortness of breath and wheezing.     Cardiovascular: Negative for chest pain, palpitations and leg swelling.       Objective:   Physical Exam Filed Vitals:   09/12/15 1415  BP: 124/66  Pulse: 91  Height: 5\' 4"  (1.626 m)  Weight: 134 lb (60.782 kg)  SpO2: 93%   RA  Gen: well appearing HENT: OP clear, TM's clear, neck supple PULM: crackles bases, left lung wheezes, normal percussion CV: RRR, no mgr, trace edema GI: BS+, soft, nontender Derm: no cyanosis or rash Psyche: normal mood and affect  Notes from my partners and Dr. Megan Salon were reviewed where she has been treated for bronchiectasis with MAI Chest imaging from 2013 as well as her most recent chest x-ray shows changes suggestive of a MAI with scattered nodular disease and bronchiectasis      Assessment & Plan:  MAC (mycobacterium avium-intracellulare complex) She should continue taking her treat 3 drug therapy. However, I would like to check to see if she is culture negative considering her symptoms.  Plan: Check sputum AFB  BRONCHIECTASIS She is having an exacerbation of her chronic bronchiectasis. I'm hopeful that this is just a viral or an acute on chronic bacterial problem rather than related to her mycobacterial disease.  Plan: Check a chest x-ray to ensure there is no evidence of pneumonia Short prednisone burst Continue 3 drug therapy for MAI Augmentin 5 days Sputum culture add Symbicort for the next 2 weeks  Current outpatient prescriptions:  .  acyclovir (ZOVIRAX) 400 MG tablet, Take 1 tablet (400 mg total) by mouth 5 (five) times daily., Disp: , Rfl:  .  atorvastatin (LIPITOR) 20 MG tablet, Take 20 mg by mouth daily., Disp: , Rfl:  .  azithromycin (ZITHROMAX) 250 MG tablet, Take 1 tablet (250 mg total) by mouth 3 (three) times a week., Disp: 12 each, Rfl: 11 .  BIOTIN 5000 PO, Take 2 capsules by mouth daily., Disp: , Rfl:  .  Calcium Carbonate-Vitamin D (CALCIUM-VITAMIN D3) 600-200 MG-UNIT TABS, Take 1 tablet by mouth 2 (two)  times daily.  , Disp: , Rfl:  .  Cholecalciferol (VITAMIN D3) 1000 UNITS CAPS, Take 2,000 Units by mouth daily., Disp: , Rfl:  .  Cyanocobalamin (VITAMIN B-12) 2500 MCG SUBL, Place under the tongue daily.  , Disp: , Rfl:  .  Docusate Calcium (STOOL SOFTENER PO), Take 100 mg by mouth 2 (two) times daily. , Disp: , Rfl:  .  estradiol (ESTRACE) 1 MG tablet, Take 1 mg by mouth daily.  , Disp: , Rfl:  .  ethambutol (MYAMBUTOL) 400 MG tablet, Take 4 tablets (1,600 mg total) by mouth 3 (three) times a week., Disp: 48 tablet, Rfl: 11 .  Flaxseed, Linseed, (FLAX SEED OIL) 1300 MG CAPS, Take by mouth daily., Disp: , Rfl:  .  omeprazole (PRILOSEC) 40 MG capsule, TAKE ONE CAPSULE BY MOUTH EVERY DAY, Disp: 90 capsule, Rfl: 1 .  Probiotic Product (PROBIOTIC DAILY PO), Take 1 capsule by mouth daily., Disp: , Rfl:  .  rifampin (RIFADIN) 300 MG capsule, Take 2 capsules (600 mg total) by mouth 3 (three) times a week., Disp: 36 capsule, Rfl: 11 .  TURMERIC PO, Take 450 mg by mouth 2 (two) times daily., Disp: , Rfl:  .  amoxicillin-clavulanate (AUGMENTIN) 875-125 MG tablet, Take 1 tablet by mouth 2 (two) times daily., Disp: 10 tablet, Rfl: 0 .  predniSONE (DELTASONE) 10 MG tablet, Take 2 tablets (20 mg total) by mouth daily with breakfast., Disp: 10 tablet, Rfl: 0

## 2015-09-12 NOTE — Addendum Note (Signed)
Addended by: Len Blalock on: 09/12/2015 02:59 PM   Modules accepted: Orders

## 2015-09-12 NOTE — Patient Instructions (Signed)
We will call you with the results of the chest x-ray Take the Symbicort 2 puffs twice a day for the next 2 weeks Get a flu shot in 10 days Take the prednisone for 5 days Take the Augmentin for 5 days We will see you back in 3 months or sooner if needed

## 2015-09-12 NOTE — Assessment & Plan Note (Signed)
She should continue taking her treat 3 drug therapy. However, I would like to check to see if she is culture negative considering her symptoms.  Plan: Check sputum AFB

## 2015-09-25 DIAGNOSIS — H2511 Age-related nuclear cataract, right eye: Secondary | ICD-10-CM | POA: Diagnosis not present

## 2015-09-25 DIAGNOSIS — Z961 Presence of intraocular lens: Secondary | ICD-10-CM | POA: Diagnosis not present

## 2015-09-30 ENCOUNTER — Telehealth: Payer: Self-pay | Admitting: *Deleted

## 2015-09-30 NOTE — Telephone Encounter (Signed)
Kiara Medina,  At the patients last visit on 09-12-15 Dr. [place] Bells ordered a repeat sputum culture but it does not look like it has been processed. Can you please follow-up with the patient and/or the lab to see if she has left a sample. If not can you please contact the patient and remind her that he wanted this culture done. Thanks for your help! If you have any questions please let me know ( I also sent you a staff message with more information.)   [place] Assistant

## 2015-10-02 NOTE — Telephone Encounter (Signed)
lmtcb for pt.  

## 2015-10-06 DIAGNOSIS — Z23 Encounter for immunization: Secondary | ICD-10-CM | POA: Diagnosis not present

## 2015-10-07 NOTE — Telephone Encounter (Signed)
lmtcb X2 for pt.  

## 2015-10-07 NOTE — Telephone Encounter (Signed)
Pt returning call and can be reached @ [phone].Kiara Medina

## 2015-10-07 NOTE — Telephone Encounter (Signed)
Spoke with pt, states that she has not had any mucus production since her last office visit, which is why she has not brought by a sputum sample.  Pt states she still has the sterile specimen cups and will bring by a sample if she is able to produce anything.  Forwarding to BQ and Jenn as FYI.

## 2015-10-09 ENCOUNTER — Other Ambulatory Visit: Payer: Self-pay | Admitting: *Deleted

## 2015-10-09 MED ORDER — OMEPRAZOLE 40 MG PO CPDR: 40.0000 mg | DELAYED_RELEASE_CAPSULE | Freq: Every day | ORAL | Status: DC

## 2015-10-24 ENCOUNTER — Encounter: Payer: Self-pay | Admitting: Pulmonary Disease

## 2015-10-24 ENCOUNTER — Ambulatory Visit (INDEPENDENT_AMBULATORY_CARE_PROVIDER_SITE_OTHER): Payer: Medicare Other | Admitting: Pulmonary Disease

## 2015-10-24 VITALS — BP 122/72 | HR 102 | Ht 64.0 in | Wt 133.4 lb

## 2015-10-24 DIAGNOSIS — A310 Pulmonary mycobacterial infection: Secondary | ICD-10-CM | POA: Diagnosis not present

## 2015-10-24 DIAGNOSIS — J479 Bronchiectasis, uncomplicated: Secondary | ICD-10-CM | POA: Diagnosis not present

## 2015-10-24 DIAGNOSIS — J449 Chronic obstructive pulmonary disease, unspecified: Secondary | ICD-10-CM

## 2015-10-24 NOTE — Progress Notes (Signed)
Subjective:    Patient ID: Kiara Medina, female    DOB: 1946/06/02, 69 y.o.   MRN: [id number]  Synopsis: Former patient of Dr. Gwenette Greet who has MAI and COPD.  Has been treated since December 2015 as guided by Dr. Megan Salon and infectious diseases. She had bronchiectasis seen on a chest CT in 2011. 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 In 2015 she did produce mucus which grew no cardiac and Mycobacterium avium complex. She was started on 3 drug therapy. Smoked 1ppd for 25 years, quit 1987 SSpiro 2010:  FVC 2.61 (87%), FEV1 2.11 (97%), ratio 81 Spiro 2015:  FVC 1.86 (65%), FEV1 1.34 (61%), ratio 72   HPI Chief Complaint  Patient presents with  . Follow-up    pt doing well, abx helped pt.  pt never had a productive cough enough to produce a sputum sample.     She is doing treat.  She says that the episode that she had with me in September was an isolated incident and she has been well since then.  Only has dyspnea with heavy exertion, walking too fast.  The cold weather sometimes makes her more dyspneic.  Not coughing up any mucus, no fevers, no chills.  No weight loss. She did have an isolated incident of hemoptypsis one time after her sickness I saw her for in late September, none since then.   Past Medical History  Diagnosis Date  . Sinusitis   . Bronchiectasis without acute exacerbation ([place])   . Cough   . Allergic rhinitis, cause unspecified       Review of Systems  Constitutional: Positive for fatigue. Negative for fever and chills.  HENT: Negative for nosebleeds, postnasal drip and rhinorrhea.   Respiratory: Positive for cough, shortness of breath and wheezing.   Cardiovascular: Negative for chest pain, palpitations and leg swelling.       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 She is currently not producing any mucus.  Plan: From my standpoint she could stop treatment with the 3 drug antibiotic therapy in December. Would continue surveillance cultures annually or if symptoms worsen  COPD (chronic obstructive pulmonary disease) with chronic bronchitis Stable interval. Continue to watch off of inhaled therapy Flu shot is up-to-date. Next line Plan: I asked her to ask her primary care physician if she has had the Prevnar vaccine  MAC (mycobacterium avium-intracellulare complex) As above.     Current outpatient prescriptions:  .  acyclovir (ZOVIRAX) 400 MG tablet, Take 1 tablet (400 mg total) by mouth 5 (five) times daily., Disp: , Rfl:  .  atorvastatin (LIPITOR) 20 MG tablet, Take 20 mg by mouth daily., Disp: , Rfl:  .  azithromycin (ZITHROMAX) 250 MG tablet, Take 1 tablet (250 mg total) by mouth 3 (three) times a week., Disp: 12 each, Rfl: 11 .  BIOTIN 5000 PO, Take  2 capsules by mouth daily., Disp: , Rfl:  .  Calcium Carbonate-Vitamin D (CALCIUM-VITAMIN D3) 600-200 MG-UNIT TABS, Take 1 tablet by mouth 2 (two) times daily.  , Disp: , Rfl:  .  Cholecalciferol (VITAMIN D3) 1000 UNITS CAPS, Take 2,000 Units by mouth daily., Disp: , Rfl:  .  Cyanocobalamin (VITAMIN B-12) 2500 MCG SUBL, Place under the tongue daily.  , Disp: , Rfl:  .  Docusate Calcium (STOOL SOFTENER PO), Take 100 mg by mouth 2 (two) times daily. , Disp: , Rfl:  .  estradiol (ESTRACE) 1 MG tablet, Take 1 mg by mouth daily.  , Disp: , Rfl:  .  ethambutol (MYAMBUTOL) 400 MG tablet, Take 4  tablets (1,600 mg total) by mouth 3 (three) times a week., Disp: 48 tablet, Rfl: 11 .  Flaxseed, Linseed, (FLAX SEED OIL) 1300 MG CAPS, Take by mouth daily., Disp: , Rfl:  .  omeprazole (PRILOSEC) 40 MG capsule, Take 1 capsule (40 mg total) by mouth daily., Disp: 90 capsule, Rfl: 1 .  Probiotic Product (PROBIOTIC DAILY PO), Take 1 capsule by mouth daily., Disp: , Rfl:  .  rifampin (RIFADIN) 300 MG capsule, Take 2 capsules (600 mg total) by mouth 3 (three) times a week., Disp: 36 capsule, Rfl: 11 .  TURMERIC PO, Take 1,000 mg by mouth daily. , Disp: , Rfl:

## 2015-10-24 NOTE — Assessment & Plan Note (Signed)
Stable interval. Continue to watch off of inhaled therapy Flu shot is up-to-date. Next line Plan: I asked her to ask her primary care physician if she has had the Prevnar vaccine

## 2015-10-24 NOTE — Assessment & Plan Note (Signed)
As above.

## 2015-10-24 NOTE — Assessment & Plan Note (Signed)
She had a flareup of her bronchiectasis which we treated successfully with Augmentin at the last visit. She is currently not producing any mucus.  Plan: From my standpoint she could stop treatment with the 3 drug antibiotic therapy in December. Would continue surveillance cultures annually or if symptoms worsen

## 2015-10-24 NOTE — Patient Instructions (Signed)
Continue taking the medications for now From my standpoint you could stop taking the antibiotics in December that said okay by Dr. Megan Salon I will see you back in 4-6 months or sooner if needed

## 2015-10-25 ENCOUNTER — Telehealth: Payer: Self-pay | Admitting: Pulmonary Disease

## 2015-10-25 NOTE — Telephone Encounter (Signed)
Patient calling to update her records.  She received her Prevnar 13 vaccine on 11/24/14 by Dr. Delena Bali...  Documented in patient's immunization record Patient notified. Nothing further needed. Closing encounter

## 2015-10-28 ENCOUNTER — Ambulatory Visit (INDEPENDENT_AMBULATORY_CARE_PROVIDER_SITE_OTHER): Payer: Medicare Other | Admitting: Internal Medicine

## 2015-10-28 ENCOUNTER — Encounter: Payer: Self-pay | Admitting: Internal Medicine

## 2015-10-28 VITALS — BP 135/81 | HR 98 | Temp 98.0°F | Wt 134.2 lb

## 2015-10-28 DIAGNOSIS — A310 Pulmonary mycobacterial infection: Secondary | ICD-10-CM | POA: Diagnosis not present

## 2015-10-28 MED ORDER — ETHAMBUTOL HCL 400 MG PO TABS: 1600.0000 mg | ORAL_TABLET | ORAL | Status: DC

## 2015-10-28 MED ORDER — RIFAMPIN 300 MG PO CAPS: 600.0000 mg | ORAL_CAPSULE | ORAL | Status: DC

## 2015-10-28 MED ORDER — AZITHROMYCIN 250 MG PO TABS: 250.0000 mg | ORAL_TABLET | ORAL | Status: DC

## 2015-10-28 NOTE — Assessment & Plan Note (Signed)
She has responded well to therapy for Mycobacterium avium pneumonia. I will have her stop her 3 drug antibiotic regimen when she completes her current refill in 2 weeks. She will follow-up with me in 3 months.

## 2015-10-28 NOTE — Progress Notes (Signed)
Patient ID: Kiara Medina, female   DOB: 12-Aug-1946, 69 y.o.   MRN: [id number]         [facility] for Infectious Disease  Patient Active Problem List   Diagnosis Date Noted  . 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 COPD (chronic obstructive pulmonary disease) with chronic bronchitis ([place]) 09/20/2014    Priority: Medium  . BRONCHIECTASIS 05/23/2009    Priority: Medium  . 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 Dyslipidemia 11/14/2014  . ALLERGIC RHINITIS 05/03/2009    Patient's Medications  New Prescriptions   No medications on file  Previous Medications   ACYCLOVIR (ZOVIRAX) 400 MG TABLET    Take 1 tablet (400 mg total) by mouth 5 (five) times daily.   ATORVASTATIN (LIPITOR) 20 MG TABLET    Take 20 mg by mouth daily.   BIOTIN 5000 PO    Take 2 capsules by mouth daily.   CALCIUM CARBONATE-VITAMIN D (CALCIUM-VITAMIN D3) 600-200 MG-UNIT TABS    Take 1 tablet by mouth 2 (two) times daily.     CHOLECALCIFEROL (VITAMIN D3) 1000 UNITS CAPS    Take 2,000 Units by mouth daily.   CYANOCOBALAMIN (VITAMIN B-12) 2500 MCG SUBL    Place under the tongue daily.     DOCUSATE CALCIUM (STOOL SOFTENER PO)    Take 100 mg by mouth 2 (two) times daily.    ESTRADIOL (ESTRACE) 1 MG TABLET    Take 1 mg by mouth daily.     FLAXSEED, LINSEED, (FLAX SEED OIL) 1300 MG CAPS    Take by mouth daily.   OMEPRAZOLE (PRILOSEC) 40 MG CAPSULE    Take 1 capsule (40 mg total) by mouth daily.   PROBIOTIC PRODUCT (PROBIOTIC DAILY PO)    Take 1 capsule by mouth daily.   TURMERIC PO    Take 1,000 mg by mouth daily.   Modified Medications   Modified Medication Previous Medication   AZITHROMYCIN (ZITHROMAX) 250 MG TABLET azithromycin (ZITHROMAX) 250 MG tablet      Take 1 tablet (250 mg total) by mouth 3 (three) times a week.    Take 1 tablet (250 mg total) by mouth 3 (three) times a week.   ETHAMBUTOL (MYAMBUTOL) 400 MG TABLET ethambutol (MYAMBUTOL) 400 MG tablet      Take 4 tablets  (1,600 mg total) by mouth 3 (three) times a week.    Take 4 tablets (1,600 mg total) by mouth 3 (three) times a week.   RIFAMPIN (RIFADIN) 300 MG CAPSULE rifampin (RIFADIN) 300 MG capsule      Take 2 capsules (600 mg total) by mouth 3 (three) times a week.    Take 2 capsules (600 mg total) by mouth 3 (three) times a week.  Discontinued Medications   No medications on file    Subjective: Kiara Medina is in for her routine follow-up visit. She is now been on azithromycin, ethambutol and rifampin for 11 1/2 months as treatment for her Mycobacterium avium pneumonia. She is feeling much better. She has no cough. She is not producing any sputum so we have not been able to get any recent, repeat AFB cultures. She continues to have sinus congestion that she attributes to seasonal allergies. She has not had any fever, chills or sweats. She has not had any vision changes.  Review of Systems: Pertinent items are noted in HPI.  Past Medical History  Diagnosis Date  . Sinusitis   . Bronchiectasis without acute exacerbation ([place])   .  Cough   . Allergic rhinitis, cause unspecified     Social History  Substance Use Topics  . Smoking status: Former Smoker -- 1.00 packs/day for 20 years    Types: Cigarettes    Quit date: 12/14/1985  . Smokeless tobacco: Never Used  . Alcohol Use: None    Family History  Problem Relation Age of Onset  . Emphysema Sister   . Stroke Mother   . Clotting disorder Mother     No Known Allergies  Objective: Filed Vitals:   10/28/15 1445  BP: 135/81  Pulse: 98  Temp: 98 F (36.7 C)  TempSrc: Oral  Weight: 134 lb 4 oz (60.895 kg)   Body mass index is 23.03 kg/(m^2).  General: She is well dressed and in no distress Skin: No rash Lungs: Clear Cor: Regular S1 and S2 with no murmurs  Lab Results Lab Results  Component Value Date   WBC 5.3 04/23/2015   HGB 15.4* 04/23/2015   HCT 45.7 04/23/2015   MCV 95.4 04/23/2015   PLT 153 04/23/2015   CMP       Component Value Date/Time   NA 140 04/23/2015 1057   K 4.1 04/23/2015 1057   CL 104 04/23/2015 1057   CO2 27 04/23/2015 1057   GLUCOSE 90 04/23/2015 1057   BUN 15 04/23/2015 1057   CREATININE 0.69 04/23/2015 1057   CREATININE 0.59 06/01/2011 1457   CALCIUM 9.6 04/23/2015 1057   PROT 6.3 04/23/2015 1057   ALBUMIN 3.8 04/23/2015 1057   AST 23 04/23/2015 1057   ALT 15 04/23/2015 1057   ALKPHOS 55 04/23/2015 1057   BILITOT 0.6 04/23/2015 1057   GFRNONAA >60 06/01/2011 1457   GFRAA >60 06/01/2011 1457      Problem List Items Addressed This Visit      High   MAC (mycobacterium avium-intracellulare complex) - Primary    She has responded well to therapy for Mycobacterium avium pneumonia. I will have her stop her 3 drug antibiotic regimen when she completes her current refill in 2 weeks. She will follow-up with me in 3 months.      Relevant Medications   rifampin (RIFADIN) 300 MG capsule   ethambutol (MYAMBUTOL) 400 MG tablet   azithromycin (ZITHROMAX) 250 MG tablet       Michel Bickers, MD [facility] [phone] pager   [phone] cell 10/28/2015, 3:01 PM

## 2015-11-18 DIAGNOSIS — Z6823 Body mass index (BMI) 23.0-23.9, adult: Secondary | ICD-10-CM | POA: Diagnosis not present

## 2015-11-18 DIAGNOSIS — J471 Bronchiectasis with (acute) exacerbation: Secondary | ICD-10-CM | POA: Diagnosis not present

## 2015-11-25 ENCOUNTER — Ambulatory Visit: Payer: Medicare Other | Admitting: Internal Medicine

## 2015-11-26 DIAGNOSIS — E559 Vitamin D deficiency, unspecified: Secondary | ICD-10-CM | POA: Diagnosis not present

## 2015-11-26 DIAGNOSIS — Z6823 Body mass index (BMI) 23.0-23.9, adult: Secondary | ICD-10-CM | POA: Diagnosis not present

## 2015-11-26 DIAGNOSIS — Z0000 Encounter for general adult medical examination without abnormal findings: Secondary | ICD-10-CM | POA: Diagnosis not present

## 2015-11-26 DIAGNOSIS — E785 Hyperlipidemia, unspecified: Secondary | ICD-10-CM | POA: Diagnosis not present

## 2015-12-10 DIAGNOSIS — C44519 Basal cell carcinoma of skin of other part of trunk: Secondary | ICD-10-CM | POA: Diagnosis not present

## 2015-12-10 DIAGNOSIS — Z85828 Personal history of other malignant neoplasm of skin: Secondary | ICD-10-CM | POA: Diagnosis not present

## 2015-12-10 DIAGNOSIS — L821 Other seborrheic keratosis: Secondary | ICD-10-CM | POA: Diagnosis not present

## 2015-12-10 DIAGNOSIS — D2221 Melanocytic nevi of right ear and external auricular canal: Secondary | ICD-10-CM | POA: Diagnosis not present

## 2016-01-13 ENCOUNTER — Other Ambulatory Visit: Payer: Self-pay

## 2016-01-13 MED ORDER — OMEPRAZOLE 40 MG PO CPDR: 40.0000 mg | DELAYED_RELEASE_CAPSULE | Freq: Every day | ORAL | Status: DC

## 2016-01-28 ENCOUNTER — Ambulatory Visit (INDEPENDENT_AMBULATORY_CARE_PROVIDER_SITE_OTHER): Payer: Medicare Other | Admitting: Internal Medicine

## 2016-01-28 ENCOUNTER — Encounter: Payer: Self-pay | Admitting: Internal Medicine

## 2016-01-28 VITALS — BP 138/79 | HR 84 | Temp 97.5°F | Wt 134.0 lb

## 2016-01-28 DIAGNOSIS — A310 Pulmonary mycobacterial infection: Secondary | ICD-10-CM

## 2016-01-28 NOTE — Assessment & Plan Note (Signed)
She has no findings to suggest early relapse of her Mycobacterium avium pneumonia. I suspect that she is having a little flare of her acid reflux that is contributing to her intermittent cough. I will continue observation off of antibiotics and have her follow-up here in 3 months.

## 2016-01-28 NOTE — Progress Notes (Signed)
[facility] for Infectious Disease  Patient Active Problem List   Diagnosis Date Noted  . 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 COPD (chronic obstructive pulmonary disease) with chronic bronchitis ([place]) 09/20/2014    Priority: Medium  . BRONCHIECTASIS 05/23/2009    Priority: Medium  . 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 Dyslipidemia 11/14/2014  . ALLERGIC RHINITIS 05/03/2009    Patient's Medications  New Prescriptions   No medications on file  Previous Medications   ACYCLOVIR (ZOVIRAX) 400 MG TABLET    Take 1 tablet (400 mg total) by mouth 5 (five) times daily.   ATORVASTATIN (LIPITOR) 20 MG TABLET    Take 20 mg by mouth daily.   BIOTIN 5000 PO    Take 2 capsules by mouth daily.   CALCIUM CARBONATE-VITAMIN D (CALCIUM-VITAMIN D3) 600-200 MG-UNIT TABS    Take 1 tablet by mouth 2 (two) times daily.     CHOLECALCIFEROL (VITAMIN D3) 1000 UNITS CAPS    Take 2,000 Units by mouth daily.   CYANOCOBALAMIN (VITAMIN B-12) 2500 MCG SUBL    Place under the tongue daily.     DOCUSATE CALCIUM (STOOL SOFTENER PO)    Take 100 mg by mouth 2 (two) times daily.    ESTRADIOL (ESTRACE) 1 MG TABLET    Take 1 mg by mouth daily.     FLAXSEED, LINSEED, (FLAX SEED OIL) 1300 MG CAPS    Take by mouth daily.   OMEPRAZOLE (PRILOSEC) 40 MG CAPSULE    Take 1 capsule (40 mg total) by mouth daily.   PROBIOTIC PRODUCT (PROBIOTIC DAILY PO)    Take 1 capsule by mouth daily.   TURMERIC PO    Take 1,000 mg by mouth daily.   Modified Medications   No medications on file  Discontinued Medications   AZITHROMYCIN (ZITHROMAX) 250 MG TABLET    Take 1 tablet (250 mg total) by mouth 3 (three) times a week.   ETHAMBUTOL (MYAMBUTOL) 400 MG TABLET    Take 4 tablets (1,600 mg total) by mouth 3 (three) times a week.    Subjective: Kiara Medina is in for her routine follow-up visit. She completed 12 months of therapy with azithromycin, rifampin and ethambutol for her Mycobacterium  avium pneumonia 2 and 1/2 months ago. She continues to have some intermittent dry cough which is her baseline. She has no shortness of breath. She's not had any fever. She notes that her acid reflux has been a little bit worse recently. I asked her if there was any correlation between days when the reflux is worse and her coughing spells and she felt that there was a correlation. She's been on her same proton pump inhibitor for many years.  Review of Systems: Review of Systems  Constitutional: Negative for fever, chills, weight loss, malaise/fatigue and diaphoresis.  HENT: Negative for sore throat.   Respiratory: Positive for cough. Negative for sputum production, shortness of breath and wheezing.   Cardiovascular: Negative for chest pain.  Gastrointestinal: Positive for heartburn. Negative for nausea, vomiting, abdominal pain and diarrhea.  Musculoskeletal: Negative for joint pain.  Skin: Negative for rash.  Neurological: Negative for headaches.    Past Medical History  Diagnosis Date  . Sinusitis   . Bronchiectasis without acute exacerbation ([place])   . Cough   . Allergic rhinitis, cause unspecified     Social History  Substance Use Topics  . Smoking status: Former Smoker -- 1.00 packs/day for 20 years  Types: Cigarettes    Quit date: 12/14/1985  . Smokeless tobacco: Never Used  . Alcohol Use: None    Family History  Problem Relation Age of Onset  . Emphysema Sister   . Stroke Mother   . Clotting disorder Mother     No Known Allergies  Objective: Filed Vitals:   01/28/16 1125  BP: 138/79  Pulse: 84  Temp: 97.5 F (36.4 C)  TempSrc: Oral  Weight: 134 lb (60.782 kg)   Body mass index is 22.99 kg/(m^2).  Physical Exam  Constitutional: She is oriented to person, place, and time.  She is well dressed and in good spirits.  Eyes: Conjunctivae are normal.  Cardiovascular: Normal rate and regular rhythm.   No murmur heard. Pulmonary/Chest: Effort normal and breath  sounds normal. She has no wheezes. She has no rales.  Abdominal: Soft. There is no tenderness.  Neurological: She is alert and oriented to person, place, and time.  Skin: No rash noted.  Psychiatric: Mood and affect normal.    Lab Results    Problem List Items Addressed This Visit      High   MAC (mycobacterium avium-intracellulare complex) - Primary    She has no findings to suggest early relapse of her Mycobacterium avium pneumonia. I suspect that she is having a little flare of her acid reflux that is contributing to her intermittent cough. I will continue observation off of antibiotics and have her follow-up here in 3 months.          Michel Bickers, MD [facility] [phone] pager   [phone] cell 01/28/2016, 11:42 AM

## 2016-02-21 ENCOUNTER — Ambulatory Visit (INDEPENDENT_AMBULATORY_CARE_PROVIDER_SITE_OTHER): Payer: Medicare Other | Admitting: Pulmonary Disease

## 2016-02-21 ENCOUNTER — Encounter: Payer: Self-pay | Admitting: Pulmonary Disease

## 2016-02-21 VITALS — BP 136/66 | HR 100 | Ht 64.0 in | Wt 134.0 lb

## 2016-02-21 DIAGNOSIS — K219 Gastro-esophageal reflux disease without esophagitis: Secondary | ICD-10-CM

## 2016-02-21 DIAGNOSIS — A310 Pulmonary mycobacterial infection: Secondary | ICD-10-CM

## 2016-02-21 DIAGNOSIS — L821 Other seborrheic keratosis: Secondary | ICD-10-CM | POA: Diagnosis not present

## 2016-02-21 DIAGNOSIS — Z85828 Personal history of other malignant neoplasm of skin: Secondary | ICD-10-CM | POA: Diagnosis not present

## 2016-02-21 DIAGNOSIS — J479 Bronchiectasis, uncomplicated: Secondary | ICD-10-CM | POA: Diagnosis not present

## 2016-02-21 DIAGNOSIS — L820 Inflamed seborrheic keratosis: Secondary | ICD-10-CM | POA: Diagnosis not present

## 2016-02-21 NOTE — Assessment & Plan Note (Signed)
This has been stable interval without any exacerbations.   She has not had clinical evidence of recurrent bronchiectasis symptoms.   Plan: Continue observation off of three drug therapy We discussed the natural history of the disease and the fact that it can recur and she knows to let us know if she has fever, weight loss, cough, more fatigue

## 2016-02-21 NOTE — Progress Notes (Signed)
Subjective:    Patient ID: Kiara Medina, female    DOB: 1946-10-21, 70 y.o.   MRN: [id number]  Synopsis: Former patient of Dr. Gwenette Greet who has MAI and COPD.  Has been treated since December 2015 as guided by Dr. Megan Salon and infectious diseases. She had bronchiectasis seen on a chest CT in 2011. 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 In 2015 she did produce mucus which grew no cardiac and Mycobacterium avium complex. She was started on 3 drug therapy. Smoked 1ppd for 25 years, quit 1987 SSpiro 2010:  FVC 2.61 (87%), FEV1 2.11 (97%), ratio 81 Spiro 2015:  FVC 1.86 (65%), FEV1 1.34 (61%), ratio 72   HPI Chief Complaint  Patient presents with  . Follow-up    pt doing well-does note a need to clear throat throughout day- wants to discuss doubling omeprazole.     Timmya stopped her three drug therapy in December and has been doing well since then.  She has not had bornchitis, trouble breathing, or weight loss or night sweats.  She says that she has been having more trouble with mucus in her throat or reflux since stopping the antibiotics.  She started taking her omeprazole twice a day and feels better.    Past Medical History  Diagnosis Date  . Sinusitis   . Bronchiectasis without acute exacerbation ([place])   . Cough   . Allergic rhinitis, cause unspecified       Review of Systems  Constitutional: Negative for fever, chills and fatigue.  HENT: Negative for nosebleeds, postnasal drip and rhinorrhea.   Respiratory: Negative for cough, shortness of breath, wheezing and stridor.   Cardiovascular: Negative for chest pain, palpitations and leg swelling.       Objective:   Physical Exam Filed Vitals:   02/21/16 1350  BP: 136/66  Pulse: 100  Height: 5\' 4"  (1.626 m)  Weight: 134 lb (60.782 kg)  SpO2: 94%   RA  Gen: well appearing HENT: OP clear, TM's clear, neck supple PULM: Clear to auscultation bilaterally, normal effort CV:  RRR, no mgr, trace edema GI: BS+, soft, nontender Derm: no cyanosis or rash Psyche: normal mood and affect       Assessment & Plan:  BRONCHIECTASIS This has been stable interval without any exacerbations.   She has not had clinical evidence of recurrent bronchiectasis symptoms.   Plan: Continue observation off of three drug therapy We discussed the natural history of the disease and the fact that it can recur and she knows to let us know if she has fever, weight loss, cough, more fatigue  MAC (mycobacterium avium-intracellulare complex) As above.  GERD (gastroesophageal reflux disease) This has been worse recently but she started taking twice a day omeprazole and that has been controlling her symptoms well.  Plan: I will change her omeprazole to twice a day     Current outpatient prescriptions:  .  acyclovir (ZOVIRAX) 400 MG tablet, Take 1 tablet (400 mg total) by mouth 5 (five) times daily., Disp: , Rfl:  .  atorvastatin (LIPITOR) 20 MG tablet, Take 20 mg by mouth daily., Disp: , Rfl:  .  BIOTIN 5000 PO, Take 2 capsules by mouth daily., Disp: , Rfl:  .  Calcium Carbonate-Vitamin D (CALCIUM-VITAMIN D3) 600-200 MG-UNIT TABS, Take 1 tablet by mouth 2 (two) times daily.  , Disp: , Rfl:  .  Cholecalciferol (VITAMIN D3) 1000 UNITS CAPS, Take 2,000 Units by mouth daily., Disp: , Rfl:  .  Docusate Calcium (STOOL SOFTENER PO), Take 100 mg by mouth 2 (two) times daily. , Disp: , Rfl:  .  estradiol (ESTRACE) 1 MG tablet, Take 1 mg by mouth daily.  , Disp: , Rfl:  .  Flaxseed, Linseed, (FLAX SEED OIL) 1300 MG CAPS, Take by mouth daily., Disp: , Rfl:  .  omeprazole (PRILOSEC) 40 MG capsule, Take 1 capsule (40 mg total) by mouth daily., Disp: 90 capsule, Rfl: 3 .  Probiotic Product (PROBIOTIC DAILY PO), Take 1 capsule by mouth daily., Disp: , Rfl:  .  TURMERIC PO, Take 2,000 mg by mouth daily. , Disp: , Rfl:  .  vitamin B-12 (CYANOCOBALAMIN) 1000 MCG tablet, Take 2,000 mcg by mouth  daily., Disp: , Rfl:

## 2016-02-21 NOTE — Assessment & Plan Note (Signed)
As above.

## 2016-02-21 NOTE — Patient Instructions (Signed)
Let us know if you have cough, weight loss, night sweats, or other symptoms suggestive of the atypical mycobacterial disease you experienced in the past I will see back in 6 months or sooner if needed

## 2016-02-21 NOTE — Assessment & Plan Note (Signed)
This has been worse recently but she started taking twice a day omeprazole and that has been controlling her symptoms well.  Plan: I will change her omeprazole to twice a day

## 2016-03-07 DIAGNOSIS — J019 Acute sinusitis, unspecified: Secondary | ICD-10-CM | POA: Diagnosis not present

## 2016-03-07 DIAGNOSIS — Z6823 Body mass index (BMI) 23.0-23.9, adult: Secondary | ICD-10-CM | POA: Diagnosis not present

## 2016-03-12 ENCOUNTER — Telehealth: Payer: Self-pay | Admitting: Pulmonary Disease

## 2016-03-12 MED ORDER — OMEPRAZOLE 40 MG PO CPDR: 40.0000 mg | DELAYED_RELEASE_CAPSULE | Freq: Two times a day (BID) | ORAL | Status: DC

## 2016-03-12 NOTE — Telephone Encounter (Signed)
Per 02/21/16: Plan: I will change her omeprazole to twice a day ----  Called spoke with pt. She needs her omperazole RX sent into [facility] for increased omep for BID. I have sent this in. Nothing further needed

## 2016-03-16 DIAGNOSIS — J019 Acute sinusitis, unspecified: Secondary | ICD-10-CM | POA: Diagnosis not present

## 2016-03-16 DIAGNOSIS — H6121 Impacted cerumen, right ear: Secondary | ICD-10-CM | POA: Diagnosis not present

## 2016-03-16 DIAGNOSIS — Z6823 Body mass index (BMI) 23.0-23.9, adult: Secondary | ICD-10-CM | POA: Diagnosis not present

## 2016-04-02 DIAGNOSIS — N951 Menopausal and female climacteric states: Secondary | ICD-10-CM | POA: Diagnosis not present

## 2016-04-02 DIAGNOSIS — Z1231 Encounter for screening mammogram for malignant neoplasm of breast: Secondary | ICD-10-CM | POA: Diagnosis not present

## 2016-04-15 DIAGNOSIS — B009 Herpesviral infection, unspecified: Secondary | ICD-10-CM | POA: Diagnosis not present

## 2016-04-15 DIAGNOSIS — Z961 Presence of intraocular lens: Secondary | ICD-10-CM | POA: Diagnosis not present

## 2016-04-15 DIAGNOSIS — H2511 Age-related nuclear cataract, right eye: Secondary | ICD-10-CM | POA: Diagnosis not present

## 2016-04-28 ENCOUNTER — Ambulatory Visit (INDEPENDENT_AMBULATORY_CARE_PROVIDER_SITE_OTHER): Payer: Medicare Other | Admitting: Internal Medicine

## 2016-04-28 ENCOUNTER — Encounter: Payer: Self-pay | Admitting: Internal Medicine

## 2016-04-28 VITALS — BP 135/83 | HR 83 | Temp 98.2°F | Wt 133.5 lb

## 2016-04-28 DIAGNOSIS — A310 Pulmonary mycobacterial infection: Secondary | ICD-10-CM | POA: Diagnosis present

## 2016-04-28 NOTE — Assessment & Plan Note (Signed)
I suspect that her persistent dry cough is multifactorial. It may be related to chronic bronchiectasis, acid reflux, allergies and her recent infectious bronchitis. I am not terribly concerned that it is related to a early relapse of Mycobacterium avium pneumonia. However if the cough persists or worsens and she is producing any sputum she will come in and give a sputum sample for repeat AFB stain and culture. She will follow-up in 3 months.

## 2016-04-28 NOTE — Progress Notes (Signed)
[facility] for Infectious Disease  Patient Active Problem List   Diagnosis Date Noted  . 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 COPD (chronic obstructive pulmonary disease) with chronic bronchitis ([place]) 09/20/2014    Priority: Medium  . BRONCHIECTASIS 05/23/2009    Priority: Medium  . GERD (gastroesophageal reflux disease) 02/21/2016  . 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 Dyslipidemia 11/14/2014  . ALLERGIC RHINITIS 05/03/2009    Patient's Medications  New Prescriptions   No medications on file  Previous Medications   ACYCLOVIR (ZOVIRAX) 400 MG TABLET    Take 1 tablet (400 mg total) by mouth 5 (five) times daily.   ATORVASTATIN (LIPITOR) 20 MG TABLET    Take 20 mg by mouth daily.   BIOTIN 5000 PO    Take 2 capsules by mouth daily.   CALCIUM CARBONATE-VITAMIN D (CALCIUM-VITAMIN D3) 600-200 MG-UNIT TABS    Take 1 tablet by mouth 2 (two) times daily.     CHOLECALCIFEROL (VITAMIN D3) 1000 UNITS CAPS    Take 2,000 Units by mouth daily.   DOCUSATE CALCIUM (STOOL SOFTENER PO)    Take 100 mg by mouth 2 (two) times daily.    FLAXSEED, LINSEED, (FLAX SEED OIL) 1300 MG CAPS    Take by mouth daily.   OMEPRAZOLE (PRILOSEC) 40 MG CAPSULE    Take 1 capsule (40 mg total) by mouth 2 (two) times daily.   PROBIOTIC PRODUCT (PROBIOTIC DAILY PO)    Take 1 capsule by mouth daily.   TURMERIC PO    Take 2,000 mg by mouth daily.    VITAMIN B-12 (CYANOCOBALAMIN) 1000 MCG TABLET    Take 2,000 mcg by mouth daily.  Modified Medications   No medications on file  Discontinued Medications   ESTRADIOL (ESTRACE) 1 MG TABLET    Take 1 mg by mouth daily.      Subjective: Kiara Medina is in for her routine follow-up visit. She completed 1 year of therapy for Mycobacterium avium pneumonia 5-1/2 months ago. She states that she's been under a great deal of stress recently. Her 17 year old father fell recently and suffered a hip fracture. He was in the hospital and is now  in rehabilitation. She has been getting relatively little sleep. About 3 weeks ago her primary care physician, Dr. Delena Bali, treated her with several antibiotics for sinusitis and bronchitis. She had a productive cough at that time. She is no longer producing any sputum and is feeling better but has been left with a dry cough that is worse than normal. She is still bothered by acid reflux. She recently increased her omeprazole to twice daily dosing. That led to transient improvement in her acid reflux but it has gotten worse again over the last 2 weeks. She does admit that since her father has been hospitalized she has been eating more fast food and that may be contributing. She has had several spells recently where she started choking and coughing while she was eating. She cannot recall if this occurred with solid food, liquids or both. She also is concerned that her seasonal allergies may be flaring up. She has been a little bit worried that the persistent dry cough could be related to Mycobacterium avium.  Review of Systems: Review of Systems  Constitutional: Negative for fever, chills, weight loss, malaise/fatigue and diaphoresis.  HENT: Negative for sore throat.   Respiratory: Positive for cough. Negative for hemoptysis, sputum production, shortness of breath and wheezing.  Cardiovascular: Negative for chest pain.  Gastrointestinal: Positive for heartburn. Negative for nausea, vomiting, abdominal pain and diarrhea.  Skin: Negative for rash.  Neurological: Negative for headaches.  Psychiatric/Behavioral: Negative for depression.    Past Medical History  Diagnosis Date  . Sinusitis   . Bronchiectasis without acute exacerbation ([place])   . Cough   . Allergic rhinitis, cause unspecified     Social History  Substance Use Topics  . Smoking status: Former Smoker -- 1.00 packs/day for 20 years    Types: Cigarettes    Quit date: 12/14/1985  . Smokeless tobacco: Never Used  . Alcohol Use: None      Family History  Problem Relation Age of Onset  . Emphysema Sister   . Stroke Mother   . Clotting disorder Mother     No Known Allergies  Objective: Filed Vitals:   04/28/16 1028  BP: 135/83  Pulse: 83  Temp: 98.2 F (36.8 C)  TempSrc: Oral  Weight: 133 lb 8 oz (60.555 kg)   Body mass index is 22.9 kg/(m^2).  Physical Exam  Constitutional: She is oriented to person, place, and time.  She is in good spirits.  HENT:  Mouth/Throat: No oropharyngeal exudate.  Eyes: Conjunctivae are normal.  Cardiovascular: Normal rate and regular rhythm.   No murmur heard. Pulmonary/Chest: Effort normal and breath sounds normal. She has no wheezes. She has no rales.  Abdominal: Soft. There is no tenderness.  Neurological: She is alert and oriented to person, place, and time.  Skin: No rash noted.  Psychiatric: Mood and affect normal.    Lab Results    Problem List Items Addressed This Visit      High   MAC (mycobacterium avium-intracellulare complex) - Primary    I suspect that her persistent dry cough is multifactorial. It may be related to chronic bronchiectasis, acid reflux, allergies and her recent infectious bronchitis. I am not terribly concerned that it is related to a early relapse of Mycobacterium avium pneumonia. However if the cough persists or worsens and she is producing any sputum she will come in and give a sputum sample for repeat AFB stain and culture. She will follow-up in 3 months.          Michel Bickers, MD [facility] for [facility] [phone] pager   [phone] cell 04/28/2016, 10:53 AM

## 2016-05-28 DIAGNOSIS — K219 Gastro-esophageal reflux disease without esophagitis: Secondary | ICD-10-CM | POA: Diagnosis not present

## 2016-05-28 DIAGNOSIS — Z6823 Body mass index (BMI) 23.0-23.9, adult: Secondary | ICD-10-CM | POA: Diagnosis not present

## 2016-05-28 DIAGNOSIS — J479 Bronchiectasis, uncomplicated: Secondary | ICD-10-CM | POA: Diagnosis not present

## 2016-05-28 DIAGNOSIS — E559 Vitamin D deficiency, unspecified: Secondary | ICD-10-CM | POA: Diagnosis not present

## 2016-05-28 DIAGNOSIS — E785 Hyperlipidemia, unspecified: Secondary | ICD-10-CM | POA: Diagnosis not present

## 2016-05-28 DIAGNOSIS — R7301 Impaired fasting glucose: Secondary | ICD-10-CM | POA: Diagnosis not present

## 2016-06-25 DIAGNOSIS — J471 Bronchiectasis with (acute) exacerbation: Secondary | ICD-10-CM | POA: Diagnosis not present

## 2016-06-25 DIAGNOSIS — Z6823 Body mass index (BMI) 23.0-23.9, adult: Secondary | ICD-10-CM | POA: Diagnosis not present

## 2016-07-27 DIAGNOSIS — M810 Age-related osteoporosis without current pathological fracture: Secondary | ICD-10-CM | POA: Diagnosis not present

## 2016-07-30 DIAGNOSIS — M19072 Primary osteoarthritis, left ankle and foot: Secondary | ICD-10-CM | POA: Diagnosis not present

## 2016-07-30 DIAGNOSIS — M2042 Other hammer toe(s) (acquired), left foot: Secondary | ICD-10-CM | POA: Diagnosis not present

## 2016-08-04 ENCOUNTER — Ambulatory Visit: Payer: Medicare Other | Admitting: Internal Medicine

## 2016-08-14 DIAGNOSIS — M25561 Pain in right knee: Secondary | ICD-10-CM | POA: Diagnosis not present

## 2016-08-14 DIAGNOSIS — M25551 Pain in right hip: Secondary | ICD-10-CM | POA: Diagnosis not present

## 2016-08-25 ENCOUNTER — Ambulatory Visit: Payer: Medicare Other | Admitting: Pulmonary Disease

## 2016-08-26 DIAGNOSIS — M25551 Pain in right hip: Secondary | ICD-10-CM | POA: Diagnosis not present

## 2016-08-27 ENCOUNTER — Ambulatory Visit: Payer: Medicare Other | Admitting: Pulmonary Disease

## 2016-08-27 ENCOUNTER — Ambulatory Visit: Payer: Medicare Other | Admitting: Internal Medicine

## 2016-09-04 DIAGNOSIS — M25551 Pain in right hip: Secondary | ICD-10-CM | POA: Diagnosis not present

## 2016-09-18 DIAGNOSIS — M25561 Pain in right knee: Secondary | ICD-10-CM | POA: Diagnosis not present

## 2016-09-18 DIAGNOSIS — M25551 Pain in right hip: Secondary | ICD-10-CM | POA: Diagnosis not present

## 2016-09-21 ENCOUNTER — Ambulatory Visit (INDEPENDENT_AMBULATORY_CARE_PROVIDER_SITE_OTHER): Payer: Medicare Other | Admitting: Pulmonary Disease

## 2016-09-21 ENCOUNTER — Encounter: Payer: Self-pay | Admitting: Pulmonary Disease

## 2016-09-21 DIAGNOSIS — Z23 Encounter for immunization: Secondary | ICD-10-CM | POA: Diagnosis not present

## 2016-09-21 DIAGNOSIS — M25551 Pain in right hip: Secondary | ICD-10-CM | POA: Diagnosis not present

## 2016-09-21 DIAGNOSIS — J479 Bronchiectasis, uncomplicated: Secondary | ICD-10-CM

## 2016-09-21 DIAGNOSIS — K219 Gastro-esophageal reflux disease without esophagitis: Secondary | ICD-10-CM

## 2016-09-21 DIAGNOSIS — M25561 Pain in right knee: Secondary | ICD-10-CM | POA: Diagnosis not present

## 2016-09-21 NOTE — Patient Instructions (Signed)
Please provide [place] with a sample of your mucus if you are able We will see you back in 6 months or sooner if needed

## 2016-09-21 NOTE — Progress Notes (Signed)
Subjective:    Patient ID: Kiara Medina, female    DOB: 1946/06/19, 70 y.o.   MRN: [id number]  Synopsis: Former patient of Dr. Gwenette Greet who has MAI.  Has been treated since December 2015 as guided by Dr. Megan Salon and infectious diseases. She had bronchiectasis seen on a chest CT in 2011. 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 In 2015 she did produce mucus which grew no cardiac and Mycobacterium avium complex. She was started on 3 drug therapy. Smoked 1ppd for 25 years, quit 1987 SSpiro 2010:  FVC 2.61 (87%), FEV1 2.11 (97%), ratio 81 Spiro 2015:  FVC 1.86 (65%), FEV1 1.34 (61%), ratio 72   HPI Chief Complaint  Patient presents with  . Follow-up    pt c/o some SOB with exertion, prod cough with    [place] is concerned about her sister Tessie Fass who is also my patient and has severe lung disease.  She is planning to travel with her next week.    She has been doing well lately.  She says when she works outside and is active she will cough more and get more short of breath.  In general she is not producing much mucus thought and she has not had any recent chest infections.  She had a sinus infection in may.     Past Medical History:  Diagnosis Date  . Allergic rhinitis, cause unspecified   . Bronchiectasis without acute exacerbation ([place])   . Cough   . Sinusitis       Review of Systems  Constitutional: Negative for chills, fatigue and fever.  HENT: Negative for nosebleeds, postnasal drip and rhinorrhea.   Respiratory: Negative for cough, shortness of breath, wheezing and stridor.   Cardiovascular: Negative for chest pain, palpitations and leg swelling.       Objective:   Physical Exam Vitals:   09/21/16 1345  BP: 128/74  Pulse: 92  SpO2: 96%  Weight: 135 lb (61.2 kg)  Height: 5\' 4"  (1.626 m)   RA  Gen: well appearing HENT: OP clear, TM's clear, neck supple PULM: Clear to auscultation bilaterally, normal effort CV: RRR, no  mgr, trace edema GI: BS+, soft, nontender Derm: no cyanosis or rash Psyche: normal mood and affect       Assessment & Plan:  BRONCHIECTASIS This has been a stable interval for her. She has not had an exacerbation since the last visit.  Plan: We will check surveillance cultures for bacteria, fungal, AFB from her mucus Flu shot today  GERD (gastroesophageal reflux disease) Continue current management.    Current Outpatient Prescriptions:  .  acyclovir (ZOVIRAX) 400 MG tablet, Take 1 tablet (400 mg total) by mouth 5 (five) times daily., Disp: , Rfl:  .  atorvastatin (LIPITOR) 20 MG tablet, Take 20 mg by mouth daily., Disp: , Rfl:  .  B Complex Vitamins (B COMPLEX-B12 PO), Take 1 tablet by mouth daily., Disp: , Rfl:  .  BIOTIN 5000 PO, Take 2 capsules by mouth daily., Disp: , Rfl:  .  Calcium Carbonate-Vitamin D (CALCIUM-VITAMIN D3) 600-200 MG-UNIT TABS, Take 1 tablet by mouth 2 (two) times daily.  , Disp: , Rfl:  .  Cholecalciferol (VITAMIN D3) 1000 UNITS CAPS, Take 2,000 Units by mouth daily., Disp: , Rfl:  .  Docusate Calcium (STOOL SOFTENER PO), Take 100 mg by mouth 2 (two) times daily. , Disp: , Rfl:  .  estradiol (ESTRACE) 0.5 MG tablet, Take 0.5 mg by mouth  4 (four) times a week., Disp: , Rfl:  .  Flaxseed, Linseed, (FLAX SEED OIL) 1300 MG CAPS, Take by mouth daily., Disp: , Rfl:  .  omeprazole (PRILOSEC) 40 MG capsule, Take 1 capsule (40 mg total) by mouth 2 (two) times daily., Disp: 180 capsule, Rfl: 3 .  Probiotic Product (PROBIOTIC DAILY PO), Take 1 capsule by mouth daily., Disp: , Rfl:  .  TURMERIC PO, Take 2,000 mg by mouth daily. , Disp: , Rfl:  .  vitamin B-12 (CYANOCOBALAMIN) 1000 MCG tablet, Take 2,000 mcg by mouth daily., Disp: , Rfl:

## 2016-09-21 NOTE — Addendum Note (Signed)
Addended by: Len Blalock on: 09/21/2016 02:09 PM   Modules accepted: Orders

## 2016-09-21 NOTE — Assessment & Plan Note (Signed)
This has been a stable interval for her. She has not had an exacerbation since the last visit.  Plan: We will check surveillance cultures for bacteria, fungal, AFB from her mucus Flu shot today

## 2016-09-21 NOTE — Assessment & Plan Note (Signed)
Continue current management

## 2016-09-25 DIAGNOSIS — M25551 Pain in right hip: Secondary | ICD-10-CM | POA: Diagnosis not present

## 2016-09-25 DIAGNOSIS — M25561 Pain in right knee: Secondary | ICD-10-CM | POA: Diagnosis not present

## 2016-09-28 DIAGNOSIS — M25561 Pain in right knee: Secondary | ICD-10-CM | POA: Diagnosis not present

## 2016-09-28 DIAGNOSIS — M25551 Pain in right hip: Secondary | ICD-10-CM | POA: Diagnosis not present

## 2016-09-29 ENCOUNTER — Ambulatory Visit: Payer: Medicare Other | Admitting: Internal Medicine

## 2016-09-30 DIAGNOSIS — M25551 Pain in right hip: Secondary | ICD-10-CM | POA: Diagnosis not present

## 2016-09-30 DIAGNOSIS — M25561 Pain in right knee: Secondary | ICD-10-CM | POA: Diagnosis not present

## 2016-10-09 DIAGNOSIS — M25561 Pain in right knee: Secondary | ICD-10-CM | POA: Diagnosis not present

## 2016-10-09 DIAGNOSIS — M25551 Pain in right hip: Secondary | ICD-10-CM | POA: Diagnosis not present

## 2016-10-12 DIAGNOSIS — M25561 Pain in right knee: Secondary | ICD-10-CM | POA: Diagnosis not present

## 2016-10-12 DIAGNOSIS — M25551 Pain in right hip: Secondary | ICD-10-CM | POA: Diagnosis not present

## 2016-10-13 ENCOUNTER — Ambulatory Visit: Payer: Medicare Other | Admitting: Internal Medicine

## 2016-10-14 DIAGNOSIS — J471 Bronchiectasis with (acute) exacerbation: Secondary | ICD-10-CM | POA: Diagnosis not present

## 2016-10-14 DIAGNOSIS — R000 Tachycardia, unspecified: Secondary | ICD-10-CM | POA: Diagnosis not present

## 2016-10-14 DIAGNOSIS — R918 Other nonspecific abnormal finding of lung field: Secondary | ICD-10-CM | POA: Diagnosis not present

## 2016-10-14 DIAGNOSIS — R6889 Other general symptoms and signs: Secondary | ICD-10-CM | POA: Diagnosis not present

## 2016-10-14 DIAGNOSIS — Z6823 Body mass index (BMI) 23.0-23.9, adult: Secondary | ICD-10-CM | POA: Diagnosis not present

## 2016-10-19 ENCOUNTER — Ambulatory Visit (INDEPENDENT_AMBULATORY_CARE_PROVIDER_SITE_OTHER): Payer: Medicare Other | Admitting: Internal Medicine

## 2016-10-19 DIAGNOSIS — A310 Pulmonary mycobacterial infection: Secondary | ICD-10-CM | POA: Diagnosis not present

## 2016-10-19 NOTE — Progress Notes (Signed)
[facility] for Infectious Disease  Patient Active Problem List   Diagnosis Date Noted  . Mycobacterium avium-intracellulare complex ([place]) 10/29/2014    Priority: High  . BRONCHIECTASIS 05/23/2009    Priority: Medium  . GERD (gastroesophageal reflux disease) 02/21/2016  . 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 Dyslipidemia 11/14/2014  . ALLERGIC RHINITIS 05/03/2009    Patient's Medications  New Prescriptions   No medications on file  Previous Medications   ACYCLOVIR (ZOVIRAX) 400 MG TABLET    Take 1 tablet (400 mg total) by mouth 5 (five) times daily.   ATORVASTATIN (LIPITOR) 20 MG TABLET    Take 20 mg by mouth daily.   AZITHROMYCIN (ZITHROMAX) 500 MG TABLET    Take 500 mg by mouth daily.   B COMPLEX VITAMINS (B COMPLEX-B12 PO)    Take 1 tablet by mouth daily.   BIOTIN 5000 PO    Take 2 capsules by mouth daily.   CALCIUM CARBONATE-VITAMIN D (CALCIUM-VITAMIN D3) 600-200 MG-UNIT TABS    Take 1 tablet by mouth 2 (two) times daily.     CHOLECALCIFEROL (VITAMIN D3) 1000 UNITS CAPS    Take 2,000 Units by mouth daily.   CIPROFLOXACIN (CIPRO) 500 MG TABLET    Take 500 mg by mouth 2 (two) times daily.   DOCUSATE CALCIUM (STOOL SOFTENER PO)    Take 100 mg by mouth 2 (two) times daily.    ESTRADIOL (ESTRACE) 0.5 MG TABLET    Take 0.5 mg by mouth 4 (four) times a week.   FLAXSEED, LINSEED, (FLAX SEED OIL) 1300 MG CAPS    Take by mouth daily.   OMEPRAZOLE (PRILOSEC) 40 MG CAPSULE    Take 1 capsule (40 mg total) by mouth 2 (two) times daily.   PROBIOTIC PRODUCT (PROBIOTIC DAILY PO)    Take 1 capsule by mouth daily.   TURMERIC PO    Take 2,000 mg by mouth daily.    VITAMIN B-12 (CYANOCOBALAMIN) 1000 MCG TABLET    Take 2,000 mcg by mouth daily.  Modified Medications   No medications on file  Discontinued Medications   No medications on file    Subjective: Kiara Medina is in for a follow-up visit. Kiara Medina completed 3 drug therapy for Mycobacterium avium pneumonia at the end of November last  year. Kiara Medina did well until recently when Kiara Medina developed sudden onset of shortness of breath a little over one week ago. Kiara Medina noticed some tachycardia with her Fitbit. Kiara Medina has been undergoing physical therapy for her right knee and her physical therapist told her that Kiara Medina should see her primary care physician, Dr. Delena Bali. Kiara Medina saw him the following day. Kiara Medina tells me that he did not hear anything abnormal on her lung exam and that her blood work was negative but a chest x-ray raised concern about new left lower lobe and right perihilar infiltrates. Kiara Medina had some mild cough but was not producing any sputum. Kiara Medina was started on empiric ciprofloxacin and azithromycin 6 days ago and is feeling a little bit better.  Review of Systems: Review of Systems  Constitutional: Positive for diaphoresis. Negative for chills, fever, malaise/fatigue and weight loss.  HENT: Negative for sore throat.   Respiratory: Positive for cough and shortness of breath. Negative for hemoptysis, sputum production and wheezing.   Cardiovascular: Negative for chest pain.  Gastrointestinal: Negative for abdominal pain, diarrhea, nausea and vomiting.  Skin: Negative for rash.    Past Medical History:  Diagnosis Date  . Allergic rhinitis,  cause unspecified   . Bronchiectasis without acute exacerbation ([place])   . Cough   . Sinusitis     Social History  Substance Use Topics  . Smoking status: Former Smoker    Packs/day: 1.00    Years: 20.00    Types: Cigarettes    Quit date: 12/14/1985  . Smokeless tobacco: Never Used  . Alcohol use Not on file    Family History  Problem Relation Age of Onset  . Emphysema Sister   . Stroke Mother   . Clotting disorder Mother     No Known Allergies  Objective: Vitals:   10/19/16 1606  BP: 121/74  Pulse: 97  SpO2: 95%  Weight: 135 lb (61.2 kg)  Height: 5' 3.5" (1.613 m)   Body mass index is 23.54 kg/m.  Physical Exam  Constitutional: Kiara Medina is oriented to person, place, and time.    Kiara Medina is in good spirits.  Cardiovascular: Normal rate and regular rhythm.   No murmur heard. Pulmonary/Chest: Effort normal and breath sounds normal. Kiara Medina has no wheezes. Kiara Medina has no rales.  Neurological: Kiara Medina is alert and oriented to person, place, and time.  Psychiatric: Mood and affect normal.    Lab Results    Problem List Items Addressed This Visit      High   Mycobacterium avium-intracellulare complex ([place])    Kiara Medina has had some recent shortness of breath and mild cough but it is not clear if this is due to a relapse of Mycobacterium avium, some other pneumonia or simply an acute exacerbation of her bronchitis. I was able to access her chest x-rays on the [facility]. Kiara Medina has bilateral reticular, nodular infiltrates at baseline. Her recent chest x-ray on 10/14/2016 was underpenetrated relative to previous chest x-rays and this would accentuate any of the previous abnormalities. I'm not actually certain that what they are calling new in the left lower lobe and right perihilar region are actually new. At this point, I recommend completing a 2 week course of her empiric antibiotics in stopping. If Kiara Medina has any recurrent problems with shortness of breath, cough or other signs of pneumonia 5*call me right away. If so, I will attempt to obtain sputum's for routine and AFB stains and cultures. Kiara Medina will follow-up here in one month.      Relevant Medications   azithromycin (ZITHROMAX) 500 MG tablet       Michel Bickers, MD [facility] for [facility] [place] [phone] pager   [phone] cell 10/19/2016, 4:51 PM

## 2016-10-19 NOTE — Assessment & Plan Note (Signed)
She has had some recent shortness of breath and mild cough but it is not clear if this is due to a relapse of Mycobacterium avium, some other pneumonia or simply an acute exacerbation of her bronchitis. I was able to access her chest x-rays on the [facility]. She has bilateral reticular, nodular infiltrates at baseline. Her recent chest x-ray on 10/14/2016 was underpenetrated relative to previous chest x-rays and this would accentuate any of the previous abnormalities. I'm not actually certain that what they are calling new in the left lower lobe and right perihilar region are actually new. At this point, I recommend completing a 2 week course of her empiric antibiotics in stopping. If she has any recurrent problems with shortness of breath, cough or other signs of pneumonia 5*call me right away. If so, I will attempt to obtain sputum's for routine and AFB stains and cultures. She will follow-up here in one month.

## 2016-11-13 DIAGNOSIS — H2511 Age-related nuclear cataract, right eye: Secondary | ICD-10-CM | POA: Diagnosis not present

## 2016-11-13 DIAGNOSIS — Z961 Presence of intraocular lens: Secondary | ICD-10-CM | POA: Diagnosis not present

## 2016-11-13 DIAGNOSIS — H16102 Unspecified superficial keratitis, left eye: Secondary | ICD-10-CM | POA: Diagnosis not present

## 2016-11-16 ENCOUNTER — Ambulatory Visit: Payer: Medicare Other | Admitting: Internal Medicine

## 2016-12-01 ENCOUNTER — Encounter: Payer: Self-pay | Admitting: Internal Medicine

## 2016-12-01 ENCOUNTER — Ambulatory Visit (INDEPENDENT_AMBULATORY_CARE_PROVIDER_SITE_OTHER): Payer: Medicare Other | Admitting: Internal Medicine

## 2016-12-01 DIAGNOSIS — A310 Pulmonary mycobacterial infection: Secondary | ICD-10-CM

## 2016-12-01 NOTE — Progress Notes (Signed)
[facility]  Patient Active Problem List   Diagnosis Date Noted  . 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 BRONCHIECTASIS 05/23/2009    Priority: Medium  . GERD (gastroesophageal reflux disease) 02/21/2016  . 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 Dyslipidemia 11/14/2014  . ALLERGIC RHINITIS 05/03/2009    Patient's Medications  New Prescriptions   No medications on file  Previous Medications   ACYCLOVIR (ZOVIRAX) 400 MG TABLET    Take 1 tablet (400 mg total) by mouth 5 (five) times daily.   ATORVASTATIN (LIPITOR) 20 MG TABLET    Take 20 mg by mouth daily.   B COMPLEX VITAMINS (B COMPLEX-B12 PO)    Take 1 tablet by mouth daily.   BIOTIN 5000 PO    Take 2 capsules by mouth daily.   CALCIUM CARBONATE-VITAMIN D (CALCIUM-VITAMIN D3) 600-200 MG-UNIT TABS    Take 1 tablet by mouth 2 (two) times daily.     CHOLECALCIFEROL (VITAMIN D3) 1000 UNITS CAPS    Take 2,000 Units by mouth daily.   DOCUSATE CALCIUM (STOOL SOFTENER PO)    Take 100 mg by mouth 2 (two) times daily.    ESTRADIOL (ESTRACE) 0.5 MG TABLET    Take 0.5 mg by mouth 4 (four) times a week.   FLAXSEED, LINSEED, (FLAX SEED OIL) 1300 MG CAPS    Take by mouth daily.   OMEPRAZOLE (PRILOSEC) 40 MG CAPSULE    Take 1 capsule (40 mg total) by mouth 2 (two) times daily.   PROBIOTIC PRODUCT (PROBIOTIC DAILY PO)    Take 1 capsule by mouth daily.   TURMERIC PO    Take 2,000 mg by mouth daily.    VITAMIN B-12 (CYANOCOBALAMIN) 1000 MCG TABLET    Take 2,000 mcg by mouth daily.  Modified Medications   No medications on file  Discontinued Medications   AZITHROMYCIN (ZITHROMAX) 500 MG TABLET    Take 500 mg by mouth daily.   CIPROFLOXACIN (CIPRO) 500 MG TABLET    Take 500 mg by mouth 2 (two) times daily.    Subjective: Kiara Medina is in for her routine follow-up visit. She completed 2 weeks of azithromycin and ciprofloxacin a little over one month ago. She believes she got a  little bit better but she is still having some dyspnea on exertion that is not her normal baseline. She has a little bit of dry cough and only occasionally brings up some yellow-green sputum. She is not having any fever, chills or sweats. Her appetite is good. She has been bothered by continued arthralgias and myalgias. This occurs only at nighttime and will occasionally wake her up. This is most noticeable in her right leg but also occasionally in her right arm and shoulders. She also suffers from morning stiffness. She also mentions that she expects this holiday to be somewhat difficult since her 17 year old father died several months ago and will not be with her. She had been his primary caregiver.  Review of Systems: Review of Systems  Constitutional: Positive for malaise/fatigue. Negative for chills, diaphoresis, fever and weight loss.  Respiratory: Positive for cough, sputum production and shortness of breath. Negative for hemoptysis and wheezing.   Cardiovascular: Negative for chest pain.  Gastrointestinal: Negative for abdominal pain, diarrhea, heartburn, nausea and vomiting.  Musculoskeletal: Positive for joint pain and myalgias.    Past Medical History:  Diagnosis Date  . Allergic rhinitis, cause unspecified   . Bronchiectasis without  acute exacerbation ([place])   . Cough   . Sinusitis     Social History  Substance Use Topics  . Smoking status: Former Smoker    Packs/day: 1.00    Years: 20.00    Types: Cigarettes    Quit date: 12/14/1985  . Smokeless tobacco: Never Used  . Alcohol use Not on file    Family History  Problem Relation Age of Onset  . Emphysema Sister   . Stroke Mother   . Clotting disorder Mother     No Known Allergies  Objective: Vitals:   12/01/16 1528 12/01/16 1535  BP: (!) 149/84 (!) 144/86  Pulse: 89   Temp: 98 F (36.7 C)   TempSrc: Oral   Weight: 133 lb (60.3 kg)    Body mass index is 23.19 kg/m.  Physical Exam  Constitutional:  She is  in good spirits. She became somewhat tearful when talking about her father.  Cardiovascular: Normal rate and regular rhythm.   No murmur heard. Pulmonary/Chest: Effort normal and breath sounds normal. She has no wheezes. She has no rales.  Musculoskeletal: Normal range of motion. She exhibits tenderness. She exhibits no edema.  Some mild tenderness with palpation of her calves bilaterally. No redness, swelling, warmth or cords are palpable.  Skin: No rash noted.  Psychiatric: Mood and affect normal.    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 I will have her stay off of antibiotics and attempt to get a sputum sample for AFB culture. She will follow-up in one month.  I do not know the cause of her arthralgias and myalgias. She had been undergoing physical therapy for her right knee and almost certainly has some component of degenerative arthritis. She was concerned that her aches and pains might be related to Mycobacterium avium and I told her that I do not believe this is the case.      Relevant Orders   AFB culture with smear (NOT at [facility])       Michel Bickers, MD [facility] for [place] [phone] pager   [phone] cell 12/01/2016, 4:00 PM

## 2016-12-01 NOTE — Assessment & Plan Note (Addendum)
I am concerned but not completely convinced that her recent respiratory problems are due to a relapse of her Mycobacterium avium pneumonia. I will have her stay off of antibiotics and attempt to get a sputum sample for AFB culture. She will follow-up in one month.  I do not know the cause of her arthralgias and myalgias. She had been undergoing physical therapy for her right knee and almost certainly has some component of degenerative arthritis. She was concerned that her aches and pains might be related to Mycobacterium avium and I told her that I do not believe this is the case.

## 2016-12-02 DIAGNOSIS — Z6823 Body mass index (BMI) 23.0-23.9, adult: Secondary | ICD-10-CM | POA: Diagnosis not present

## 2016-12-02 DIAGNOSIS — K219 Gastro-esophageal reflux disease without esophagitis: Secondary | ICD-10-CM | POA: Diagnosis not present

## 2016-12-02 DIAGNOSIS — J471 Bronchiectasis with (acute) exacerbation: Secondary | ICD-10-CM | POA: Diagnosis not present

## 2016-12-02 DIAGNOSIS — Z0000 Encounter for general adult medical examination without abnormal findings: Secondary | ICD-10-CM | POA: Diagnosis not present

## 2016-12-02 DIAGNOSIS — R7301 Impaired fasting glucose: Secondary | ICD-10-CM | POA: Diagnosis not present

## 2016-12-02 DIAGNOSIS — E559 Vitamin D deficiency, unspecified: Secondary | ICD-10-CM | POA: Diagnosis not present

## 2016-12-02 DIAGNOSIS — E785 Hyperlipidemia, unspecified: Secondary | ICD-10-CM | POA: Diagnosis not present

## 2016-12-26 ENCOUNTER — Other Ambulatory Visit: Payer: Self-pay | Admitting: Pulmonary Disease

## 2016-12-31 ENCOUNTER — Ambulatory Visit: Payer: Medicare Other | Admitting: Internal Medicine

## 2017-01-05 ENCOUNTER — Ambulatory Visit: Payer: Medicare Other | Admitting: Internal Medicine

## 2017-01-18 ENCOUNTER — Telehealth: Payer: Self-pay
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
## 2017-01-20 NOTE — Telephone Encounter (Signed)
Please double book Ms. Kiara Medina with me tomorrow, 01/21/2017, at 11:15 AM. Thanks.

## 2017-01-20 NOTE — Telephone Encounter (Signed)
Spoke with patient. 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

## 2017-01-21 ENCOUNTER — Ambulatory Visit (INDEPENDENT_AMBULATORY_CARE_PROVIDER_SITE_OTHER): Payer: Medicare HMO | Admitting: Internal Medicine

## 2017-01-21 ENCOUNTER — Encounter: Payer: Self-pay | Admitting: Internal Medicine

## 2017-01-21 DIAGNOSIS — A310 Pulmonary mycobacterial infection: Secondary | ICD-10-CM

## 2017-01-21 DIAGNOSIS — M25511 Pain in right shoulder: Secondary | ICD-10-CM | POA: Diagnosis not present

## 2017-01-21 DIAGNOSIS — Z1159 Encounter for screening for other viral diseases: Secondary | ICD-10-CM | POA: Insufficient documentation

## 2017-01-21 NOTE — Assessment & Plan Note (Signed)
Despite her intermittent dry cough I do not believe she has clear evidence of recurrence of her Mycobacterium avium infection. We both agree that she will simply stay off of antibiotic therapy and follow-up here as needed.

## 2017-01-21 NOTE — Progress Notes (Signed)
Patient Active Problem List   Diagnosis Date Noted  . 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 BRONCHIECTASIS 05/23/2009    Priority: Medium  . Right shoulder pain 01/21/2017  . Encounter for hepatitis C screening test for low risk patient 01/21/2017  . GERD (gastroesophageal reflux disease) 02/21/2016  . 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 Dyslipidemia 11/14/2014  . ALLERGIC RHINITIS 05/03/2009    Patient's Medications  New Prescriptions   No medications on file  Previous Medications   ACYCLOVIR (ZOVIRAX) 400 MG TABLET    Take 1 tablet (400 mg total) by mouth 5 (five) times daily.   ATORVASTATIN (LIPITOR) 20 MG TABLET    Take 20 mg by mouth daily.   B COMPLEX VITAMINS (B COMPLEX-B12 PO)    Take 1 tablet by mouth daily.   BIOTIN 5000 PO    Take 2 capsules by mouth daily.   CALCIUM CARBONATE-VITAMIN D (CALCIUM-VITAMIN D3) 600-200 MG-UNIT TABS    Take 1 tablet by mouth 2 (two) times daily.     CHOLECALCIFEROL (VITAMIN D3) 1000 UNITS CAPS    Take 2,000 Units by mouth daily.   DOCUSATE CALCIUM (STOOL SOFTENER PO)    Take 100 mg by mouth 2 (two) times daily.    ESTRADIOL (ESTRACE) 0.5 MG TABLET    Take 0.5 mg by mouth 4 (four) times a week.   FLAXSEED, LINSEED, (FLAX SEED OIL) 1300 MG CAPS    Take by mouth daily.   OMEPRAZOLE (PRILOSEC) 40 MG CAPSULE    TAKE 1 CAPSULE (40 MG TOTAL) BY MOUTH 2  TIMES DAILY.   PROBIOTIC PRODUCT (PROBIOTIC DAILY PO)    Take 1 capsule by mouth daily.   TURMERIC PO    Take 2,000 mg by mouth daily.    VITAMIN B-12 (CYANOCOBALAMIN) 1000 MCG TABLET    Take 2,000 mcg by mouth daily.  Modified Medications   No medications on file  Discontinued Medications   No medications on file    Subjective: Kiara Medina is in for her routine follow-up visit. She completed 1 year of antibiotic therapy for Mycobacterium avium in January of last year. She was treated for bronchitis and possible pneumonia with azithromycin and  ciprofloxacin in November and had some improvement and productive cough. She's had some intermittent dry cough since that time. She has not been able to produce any sputum for repeat cultures. She has her mild, baseline dyspnea on exertion, she has had not had any fever, chills, sweats. Her appetite is good and she has had no change in weight. She has had no unusual fatigue.  She has had some pain in her right upper arm and shoulder for the past several months. He is potentially bothersome when she tries to raise her arm over her head or put a sweater or jacket on. She has had previous problems with right olecranon bursitis requiring steroid injections area did she does not recall any injury to her shoulder.  She had been concerned about a possible grief reaction around the holidays after her father died last year. She states that she was with her sisters and extended family and she has not been having any significant problems with grief or depression.  She has many questions about hepatitis C after seen ads on TV. She never used any injecting drugs or had a blood transfusion but wonders if she needs to be screened. She states that Dr. Elissa Hefty, her primary care provider  did blood work at the time of her routine physical last month but she is not sure if this was checked.  She plans on retiring the summer.  Review of Systems: Review of Systems  Constitutional: Negative for chills, diaphoresis, fever, malaise/fatigue and weight loss.  HENT: Negative for sore throat.   Respiratory: Positive for cough and shortness of breath. Negative for sputum production.   Cardiovascular: Negative for chest pain.  Gastrointestinal: Negative for abdominal pain, diarrhea, heartburn, nausea and vomiting.  Genitourinary: Negative for dysuria and frequency.  Musculoskeletal: Negative for joint pain and myalgias.  Skin: Negative for rash.  Neurological: Negative for dizziness and headaches.  Psychiatric/Behavioral:  Negative for depression and substance abuse. The patient is not nervous/anxious.     Past Medical History:  Diagnosis Date  . Allergic rhinitis, cause unspecified   . Bronchiectasis without acute exacerbation ([place])   . Cough   . Sinusitis     Social History  Substance Use Topics  . Smoking status: Former Smoker    Packs/day: 1.00    Years: 20.00    Types: Cigarettes    Quit date: 12/14/1985  . Smokeless tobacco: Never Used  . Alcohol use 0.6 oz/week    1 Glasses of wine per week    Family History  Problem Relation Age of Onset  . Emphysema Sister   . Stroke Mother   . Clotting disorder Mother     No Known Allergies  Objective:  Vitals:   01/21/17 1113  BP: (!) 162/77  Pulse: 84  Temp: 98 F (36.7 C)  TempSrc: Oral  Weight: 133 lb 12.8 oz (60.7 kg)  Height: 5\' 3"  (1.6 m)   Body mass index is 23.7 kg/m.  Physical Exam  Constitutional: She is oriented to person, place, and time.  She is alert and in no distress. She is in good spirits.  HENT:  Mouth/Throat: No oropharyngeal exudate.  Eyes: Conjunctivae are normal.  Cardiovascular: Normal rate and regular rhythm.   No murmur heard. Pulmonary/Chest: Breath sounds normal. She has no wheezes. She has no rales.  Abdominal: Soft. She exhibits no mass. There is no tenderness.  Musculoskeletal: Normal range of motion. She exhibits tenderness. She exhibits no edema.  She has some point tenderness over her right shoulder anteriorly and some discomfort with adduction.  Neurological: She is alert and oriented to person, place, and time.  Skin: No rash noted.  Psychiatric: Mood and affect normal.    Lab Results Lab Results  Component Value Date   WBC 5.3 04/23/2015   HGB 15.4 (H) 04/23/2015   HCT 45.7 04/23/2015   MCV 95.4 04/23/2015   PLT 153 04/23/2015    Lab Results  Component Value Date   CREATININE 0.69 04/23/2015   BUN 15 04/23/2015   NA 140 04/23/2015   K 4.1 04/23/2015   CL 104 04/23/2015   CO2 27  04/23/2015    Lab Results  Component Value Date   ALT 15 04/23/2015   AST 23 04/23/2015   ALKPHOS 55 04/23/2015   BILITOT 0.6 04/23/2015    No results found for: CHOL, HDL, LDLCALC, LDLDIRECT, TRIG, CHOLHDL No results found for: HIV1RNAQUANT, HIV1RNAVL, CD4TABS   Problem List Items Addressed This Visit      High   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 We both agree that she will simply stay off of antibiotic therapy  and follow-up here as needed.        Unprioritized   Encounter for hepatitis C screening test for low risk patient    I talked to her about what hepatitis C was, what the risk factors include, testing procedure and follow-up. Since she is not sure what blood work has been done by her primary care provider she will discuss this with him so that we do not duplicate any testing. She no she can follow-up here if she has any further questions.      Right shoulder pain    I suspect that she may have some rotator cuff tendinopathy. She is taking Naprosyn but may benefit from some physical therapy. She will discuss this with her primary care provider.           Michel Bickers, MD [facility] [phone] pager   [phone] cell 01/21/2017, 11:59 AM

## 2017-01-21 NOTE — Assessment & Plan Note (Signed)
I suspect that she may have some rotator cuff tendinopathy. She is taking Naprosyn but may benefit from some physical therapy. She will discuss this with her primary care provider.

## 2017-01-21 NOTE — Assessment & Plan Note (Signed)
I talked to her about what hepatitis C was, what the risk factors include, testing procedure and follow-up. Since she is not sure what blood work has been done by her primary care provider she will discuss this with him so that we do not duplicate any testing. She no she can follow-up here if she has any further questions.

## 2017-01-26 DIAGNOSIS — L719 Rosacea, unspecified: Secondary | ICD-10-CM | POA: Diagnosis not present

## 2017-01-26 DIAGNOSIS — Z6823 Body mass index (BMI) 23.0-23.9, adult: Secondary | ICD-10-CM | POA: Diagnosis not present

## 2017-02-02 DIAGNOSIS — L710 Perioral dermatitis: Secondary | ICD-10-CM | POA: Diagnosis not present

## 2017-04-16 DIAGNOSIS — Z1231 Encounter for screening mammogram for malignant neoplasm of breast: Secondary | ICD-10-CM | POA: Diagnosis not present

## 2017-04-23 DIAGNOSIS — M7021 Olecranon bursitis, right elbow: Secondary | ICD-10-CM | POA: Diagnosis not present

## 2017-04-23 DIAGNOSIS — M25521 Pain in right elbow: Secondary | ICD-10-CM | POA: Diagnosis not present

## 2017-04-23 DIAGNOSIS — Z6823 Body mass index (BMI) 23.0-23.9, adult: Secondary | ICD-10-CM | POA: Diagnosis not present

## 2017-04-30 DIAGNOSIS — Z1211 Encounter for screening for malignant neoplasm of colon: Secondary | ICD-10-CM | POA: Diagnosis not present

## 2017-05-12 DIAGNOSIS — Z1211 Encounter for screening for malignant neoplasm of colon: Secondary | ICD-10-CM | POA: Diagnosis not present

## 2017-05-12 DIAGNOSIS — K635 Polyp of colon: Secondary | ICD-10-CM | POA: Diagnosis not present

## 2017-05-14 DIAGNOSIS — K635 Polyp of colon: Secondary | ICD-10-CM | POA: Diagnosis not present

## 2017-06-03 ENCOUNTER — Telehealth: Payer: Self-pay | Admitting: *Deleted

## 2017-06-03 NOTE — Telephone Encounter (Signed)
Patient called stating she is out of town at the [employer] and has developed a terrible cough. She is requesting to be seen as soon as she returns. Appointment available on 06/10/17 and patient scheduled.

## 2017-06-08 DIAGNOSIS — K635 Polyp of colon: Secondary | ICD-10-CM | POA: Diagnosis not present

## 2017-06-10 ENCOUNTER — Other Ambulatory Visit: Payer: Self-pay | Admitting: Internal Medicine

## 2017-06-10 ENCOUNTER — Encounter: Payer: Self-pay | Admitting: Internal Medicine

## 2017-06-10 ENCOUNTER — Ambulatory Visit (INDEPENDENT_AMBULATORY_CARE_PROVIDER_SITE_OTHER): Payer: Medicare HMO | Admitting: Internal Medicine

## 2017-06-10 DIAGNOSIS — A310 Pulmonary mycobacterial infection: Secondary | ICD-10-CM | POA: Diagnosis not present

## 2017-06-10 NOTE — Assessment & Plan Note (Signed)
I am not sure what caused her transient cough and night sweats but I doubt that it was a relapse of Mycobacterium avium. It would be very unlikely that she would have rapid, spontaneous resolution of her symptoms if her illness was due to to a relapse. I will not pursue chest x-ray today. She is comfortable with a wait and see approach to this.

## 2017-06-10 NOTE — Progress Notes (Signed)
[facility] for Infectious Disease  Patient Active Problem List   Diagnosis Date Noted  . 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 BRONCHIECTASIS 05/23/2009    Priority: Medium  . Right shoulder pain 01/21/2017  . Encounter for hepatitis C screening test for low risk patient 01/21/2017  . GERD (gastroesophageal reflux disease) 02/21/2016  . 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 Dyslipidemia 11/14/2014  . ALLERGIC RHINITIS 05/03/2009    Patient's Medications  New Prescriptions   No medications on file  Previous Medications   ACYCLOVIR (ZOVIRAX) 400 MG TABLET    Take 1 tablet (400 mg total) by mouth 5 (five) times daily.   ATORVASTATIN (LIPITOR) 20 MG TABLET    Take 20 mg by mouth daily.   B COMPLEX VITAMINS (B COMPLEX-B12 PO)    Take 1 tablet by mouth daily.   BIOTIN 5000 PO    Take 2 capsules by mouth daily.   CALCIUM CARBONATE-VITAMIN D (CALCIUM-VITAMIN D3) 600-200 MG-UNIT TABS    Take 1 tablet by mouth 2 (two) times daily.     CHOLECALCIFEROL (VITAMIN D3) 1000 UNITS CAPS    Take 2,000 Units by mouth daily.   DOCUSATE CALCIUM (STOOL SOFTENER PO)    Take 100 mg by mouth 2 (two) times daily.    ESTRADIOL (ESTRACE) 0.5 MG TABLET    Take 0.5 mg by mouth 4 (four) times a week.   FLAXSEED, LINSEED, (FLAX SEED OIL) 1300 MG CAPS    Take by mouth daily.   OMEPRAZOLE (PRILOSEC) 40 MG CAPSULE    TAKE 1 CAPSULE (40 MG TOTAL) BY MOUTH 2  TIMES DAILY.   PROBIOTIC PRODUCT (PROBIOTIC DAILY PO)    Take 1 capsule by mouth daily.   TURMERIC PO    Take 2,000 mg by mouth daily.    VITAMIN B-12 (CYANOCOBALAMIN) 1000 MCG TABLET    Take 2,000 mcg by mouth daily.  Modified Medications   No medications on file  Discontinued Medications   No medications on file    Subjective: Kiara Medina is in for her routine follow-up visit. She completed 1 year of antibiotic therapy for Mycobacterium avium pneumonia in January 2017. [place] she began to develop a hacking cough and night sweats. She was a little more short of breath than normal. She started producing some thin white sputum. The cough was bad enough that she had difficulty talking on the phone. She does not believe she was having any fever. As soon as she got back home 4 days ago the cough and night sweats resolved. She is feeling back to normal.  Review of Systems: Review of Systems  Constitutional: Positive for diaphoresis. Negative for chills, fever, malaise/fatigue and weight loss.  HENT: Negative for congestion and sore throat.   Respiratory: Positive for cough, sputum production and shortness of breath. Negative for hemoptysis.   Cardiovascular: Negative for chest pain.  Skin: Negative for rash.    Past Medical History:  Diagnosis Date  . Allergic rhinitis, cause unspecified   . Bronchiectasis without acute exacerbation ([place])   . Cough   . Sinusitis     Social History  Substance Use Topics  . Smoking status: Former Smoker    Packs/day: 1.00    Years: 20.00    Types: Cigarettes    Quit date: 12/14/1985  . Smokeless tobacco: Never Used  . Alcohol use  0.6 oz/week    1 Glasses of wine per week    Family History  Problem Relation Age of Onset  . Emphysema Sister   . Stroke Mother   . Clotting disorder Mother     No Known Allergies  Objective: Vitals:   06/10/17 0932  BP: 129/79  Pulse: 78  Temp: 98 F (36.7 C)  Height: 5\' 3"  (1.6 m)   There is no height or weight on file to calculate BMI.  Physical Exam  Constitutional: She is oriented to person, place, and time.  She is in good spirits and appears healthy.  Cardiovascular: Normal rate and regular rhythm.   No murmur heard. Pulmonary/Chest: Effort normal and breath sounds normal. She has no wheezes. She has no rales.  Neurological: She is alert and oriented to person, place, and time.  Skin: No rash noted.  Psychiatric: Mood and  affect normal.    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 It would be very unlikely that she would have rapid, spontaneous resolution of her symptoms if her illness was due to to a relapse. I will not pursue chest x-ray today. She is comfortable with a wait and see approach to this.      Relevant Orders   AFB Culture & Smear       Michel Bickers, MD [facility] for [place] [phone] pager   [phone] cell 06/10/2017, 9:52 AM

## 2017-06-12 DIAGNOSIS — K219 Gastro-esophageal reflux disease without esophagitis: Secondary | ICD-10-CM | POA: Diagnosis not present

## 2017-06-12 DIAGNOSIS — J479 Bronchiectasis, uncomplicated: Secondary | ICD-10-CM | POA: Diagnosis not present

## 2017-06-12 DIAGNOSIS — E559 Vitamin D deficiency, unspecified: Secondary | ICD-10-CM | POA: Diagnosis not present

## 2017-06-12 DIAGNOSIS — Z1389 Encounter for screening for other disorder: Secondary | ICD-10-CM | POA: Diagnosis not present

## 2017-06-12 DIAGNOSIS — E785 Hyperlipidemia, unspecified: Secondary | ICD-10-CM | POA: Diagnosis not present

## 2017-06-12 DIAGNOSIS — R7301 Impaired fasting glucose: Secondary | ICD-10-CM | POA: Diagnosis not present

## 2017-06-13 ENCOUNTER — Telehealth: Payer: Self-pay | Admitting: Infectious Disease

## 2017-06-13 NOTE — Telephone Encounter (Signed)
   JOHN I got call from [employer] re critical result of rare AFB. I am sure it is M avium

## 2017-06-22 ENCOUNTER — Telehealth: Payer: Self-pay | Admitting: *Deleted

## 2017-06-22 ENCOUNTER — Other Ambulatory Visit: Payer: Self-pay | Admitting: Internal Medicine

## 2017-06-22 DIAGNOSIS — A310 Pulmonary mycobacterial infection: Secondary | ICD-10-CM

## 2017-06-22 NOTE — Telephone Encounter (Signed)
Patient called, wanted to see if the cultures were complete yet.  She also wanted to let DR Megan Salon know that she had an episode of hemoptysis last night while watching TV. She states she coughed up about 1 teaspoon full of clotted blood.  This last happened in January (but she was not sure that she remembered to tell you about it at her last appointment). Landis Gandy, RN

## 2017-06-22 NOTE — Telephone Encounter (Signed)
Please let Minnie know that her cultures are still negative but are not final yet. Also see if she can move her appointment up with me next Monday, 06/28/2017 at 1:30 PM. I put in an order for a chest x-ray. I would like her to get that Monday morning before the appointment if possible.

## 2017-06-22 NOTE — Telephone Encounter (Signed)
Patient will get CXR, but has conflict 92:[id number] on Monday, but can come any other time.  Please advise.

## 2017-06-22 NOTE — Telephone Encounter (Signed)
Ask her if 11:30 AM or 4:15 PM would work on Tuesday, July 17.

## 2017-06-23 DIAGNOSIS — L820 Inflamed seborrheic keratosis: Secondary | ICD-10-CM | POA: Diagnosis not present

## 2017-06-23 NOTE — Telephone Encounter (Signed)
She will come Tuesday at 4:15. She will get her CXR on Friday. Thanks!

## 2017-06-25 ENCOUNTER — Ambulatory Visit
Admission: RE | Admit: 2017-06-25 | Discharge: 2017-06-25 | Disposition: A | Discharge status: Home / Self Care | Payer: Medicare HMO | Source: Ambulatory Visit | Attending: Internal Medicine | Admitting: Internal Medicine

## 2017-06-25 DIAGNOSIS — R05 Cough: Secondary | ICD-10-CM | POA: Diagnosis not present

## 2017-06-25 DIAGNOSIS — A310 Pulmonary mycobacterial infection: Secondary | ICD-10-CM

## 2017-06-29 ENCOUNTER — Ambulatory Visit (INDEPENDENT_AMBULATORY_CARE_PROVIDER_SITE_OTHER): Payer: Medicare HMO | Admitting: Internal Medicine

## 2017-06-29 DIAGNOSIS — A310 Pulmonary mycobacterial infection: Secondary | ICD-10-CM | POA: Diagnosis not present

## 2017-06-29 MED ORDER — RIFAMPIN 300 MG PO CAPS: 600.0000 mg | ORAL_CAPSULE | ORAL | 3 refills | Status: AC

## 2017-06-29 MED ORDER — AZITHROMYCIN 250 MG PO TABS: 250.0000 mg | ORAL_TABLET | ORAL | 3 refills | Status: AC

## 2017-06-29 MED ORDER — ETHAMBUTOL HCL 400 MG PO TABS: 1600.0000 mg | ORAL_TABLET | ORAL | 3 refills | Status: AC

## 2017-06-29 NOTE — Progress Notes (Signed)
[place] for Infectious Disease  Patient Active Problem List   Diagnosis Date Noted  . 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 BRONCHIECTASIS 05/23/2009    Priority: Medium  . Right shoulder pain 01/21/2017  . Encounter for hepatitis C screening test for low risk patient 01/21/2017  . GERD (gastroesophageal reflux disease) 02/21/2016  . 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 Dyslipidemia 11/14/2014  . ALLERGIC RHINITIS 05/03/2009    Patient's Medications  New Prescriptions   No medications on file  Previous Medications   ACYCLOVIR (ZOVIRAX) 400 MG TABLET    Take 1 tablet (400 mg total) by mouth 5 (five) times daily.   ATORVASTATIN (LIPITOR) 20 MG TABLET    Take 20 mg by mouth daily.   B COMPLEX VITAMINS (B COMPLEX-B12 PO)    Take 1 tablet by mouth daily.   BIOTIN 5000 PO    Take 2 capsules by mouth daily.   CALCIUM CARBONATE-VITAMIN D (CALCIUM-VITAMIN D3) 600-200 MG-UNIT TABS    Take 1 tablet by mouth 2 (two) times daily.     CHOLECALCIFEROL (VITAMIN D3) 1000 UNITS CAPS    Take 2,000 Units by mouth daily.   DOCUSATE CALCIUM (STOOL SOFTENER PO)    Take 100 mg by mouth 2 (two) times daily.    FLAXSEED, LINSEED, (FLAX SEED OIL) 1300 MG CAPS    Take by mouth daily.   OMEPRAZOLE (PRILOSEC) 40 MG CAPSULE    TAKE 1 CAPSULE (40 MG TOTAL) BY MOUTH 2  TIMES DAILY.   PROBIOTIC PRODUCT (PROBIOTIC DAILY PO)    Take 1 capsule by mouth daily.   TURMERIC PO    Take 2,000 mg by mouth daily.    VITAMIN B-12 (CYANOCOBALAMIN) 1000 MCG TABLET    Take 2,000 mcg by mouth daily.  Modified Medications   Modified Medication Previous Medication   AZITHROMYCIN (ZITHROMAX) 250 MG TABLET azithromycin (ZITHROMAX) 250 MG tablet      Take 1 tablet (250 mg total) by mouth 3 (three) times a week.    Take 1 tablet (250 mg total) by mouth 3 (three) times a week.   ETHAMBUTOL (MYAMBUTOL) 400 MG TABLET ethambutol (MYAMBUTOL) 400 MG tablet      Take 4 tablets (1,600  mg total) by mouth 3 (three) times a week.    Take 4 tablets (1,600 mg total) by mouth 3 (three) times a week.   RIFAMPIN (RIFADIN) 300 MG CAPSULE rifampin (RIFADIN) 300 MG capsule      Take 2 capsules (600 mg total) by mouth 3 (three) times a week.    Take 2 capsules (600 mg total) by mouth 3 (three) times a week.  Discontinued Medications   ESTRADIOL (ESTRACE) 0.5 MG TABLET    Take 0.5 mg by mouth 4 (four) times a week.    Subjective: Kiara Medina is seen on a work in basis. She completed 1 year of antibiotic therapy for Mycobacterium avium pneumonia in late 2016. She did well until about 1 month ago when she started to have worsening cough and night sweats. She has had 2 episodes of hemoptysis. There has been no change in her dyspnea on exertion. Sputum was collected on 06/10/2017 and her AFB smear showed 1+ AFB. Culture is pending.  Review of Systems: Review of Systems  Constitutional: Positive for diaphoresis. Negative for chills, fever and weight loss.  Respiratory: Positive for cough, hemoptysis, sputum production and shortness of breath.   Cardiovascular: Negative for chest  pain.  Gastrointestinal: Negative for abdominal pain, diarrhea, nausea and vomiting.    Past Medical History:  Diagnosis Date  . Allergic rhinitis, cause unspecified   . Bronchiectasis without acute exacerbation ([place])   . Cough   . Sinusitis     Social History  Substance Use Topics  . Smoking status: Former Smoker    Packs/day: 1.00    Years: 20.00    Types: Cigarettes    Quit date: 12/14/1985  . Smokeless tobacco: Never Used  . Alcohol use 0.6 oz/week    1 Glasses of wine per week    Family History  Problem Relation Age of Onset  . Emphysema Sister   . Stroke Mother   . Clotting disorder Mother     No Known Allergies  Objective: Vitals:   06/29/17 1614  Weight: 133 lb (60.3 kg)   Body mass index is 23.56 kg/m.  Physical Exam  Constitutional: She is oriented to person, place, and time.  She  appears comfortable and in good spirits.  Cardiovascular: Normal rate.   No murmur heard. Pulmonary/Chest: Effort normal and breath sounds normal. She has no wheezes. She has no rales.  Neurological: She is alert and oriented to person, place, and time.  Psychiatric: Mood and affect normal.    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 I will restart azithromycin, ethambutol and rifampin and see her back in one month.      Relevant Medications   azithromycin (ZITHROMAX) 250 MG tablet (Start on 06/30/2017)       Michel Bickers, MD [place] for [facility] [phone] pager   [phone] cell 06/29/2017, 5:20 PM

## 2017-06-29 NOTE — Assessment & Plan Note (Signed)
It appears that she has relapsed with symptomatic Mycobacterium avium infection. I will restart azithromycin, ethambutol and rifampin and see her back in one month.

## 2017-07-02 ENCOUNTER — Telehealth: Payer: Self-pay | Admitting: *Deleted

## 2017-07-02 NOTE — Telephone Encounter (Signed)
MAI rxes will be $240 / 3 months.  She shared that they were previously not that expensive but she has also changed her policy with [employer].  She decided to have them mail ordered to her even with the expense because she knows how well they worked in the past.  She is having a one month supply sent.  RN advised her to talk with the [facility] pharmacist so that she lessens any possible side effects/intolerences.  Patient verbalized understanding.  She has her one month follow up appointment scheduled.

## 2017-07-02 NOTE — Telephone Encounter (Signed)
Call from [facility] to let Dr Megan Salon know that the sputum culture was positive for [facility]. Landis Gandy, RN

## 2017-07-08 LAB — AFB CULTURE WITH SMEAR (NOT AT ARMC)

## 2017-07-14 ENCOUNTER — Other Ambulatory Visit: Payer: Self-pay | Admitting: Pharmacist

## 2017-07-14 ENCOUNTER — Telehealth: Payer: Self-pay

## 2017-07-14 NOTE — Telephone Encounter (Signed)
Patient is calling to report she was given minocycline for adult acne.  She wants to make sure it is not contraindicated with medications she is take for mycobacterium avium intracellulare complex. I entered medication into patients medication list and there was not indication of contraindication however I will  seek counsel from our pharmacist.    Per Lavona Mound, Pharmacist this medication is safe to start.    Laverle Patter, RN

## 2017-07-21 ENCOUNTER — Ambulatory Visit (INDEPENDENT_AMBULATORY_CARE_PROVIDER_SITE_OTHER): Payer: Medicare HMO | Admitting: Internal Medicine

## 2017-07-21 DIAGNOSIS — A310 Pulmonary mycobacterial infection: Secondary | ICD-10-CM

## 2017-07-22 ENCOUNTER — Encounter: Payer: Self-pay | Admitting: Internal Medicine

## 2017-07-22 NOTE — Assessment & Plan Note (Signed)
History early to expect much improvement in her relapsed Mycobacterium avium pneumonia. I will obtain repeat sputum AFB stain and culture. I am awaiting antibiotic susceptibility results. She will follow-up in one month.

## 2017-07-22 NOTE — Progress Notes (Signed)
[place] for Infectious Disease  Patient Active Problem List   Diagnosis Date Noted  . 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 BRONCHIECTASIS 05/23/2009    Priority: Medium  . Right shoulder pain 01/21/2017  . Encounter for hepatitis C screening test for low risk patient 01/21/2017  . GERD (gastroesophageal reflux disease) 02/21/2016  . 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 Dyslipidemia 11/14/2014  . ALLERGIC RHINITIS 05/03/2009    Patient's Medications  New Prescriptions   No medications on file  Previous Medications   ACYCLOVIR (ZOVIRAX) 400 MG TABLET    Take 1 tablet (400 mg total) by mouth 5 (five) times daily.   ATORVASTATIN (LIPITOR) 20 MG TABLET    Take 20 mg by mouth daily.   B COMPLEX VITAMINS (B COMPLEX-B12 PO)    Take 1 tablet by mouth daily.   BIOTIN 5000 PO    Take 2 capsules by mouth daily.   CALCIUM CARBONATE-VITAMIN D (CALCIUM-VITAMIN D3) 600-200 MG-UNIT TABS    Take 1 tablet by mouth 2 (two) times daily.     CHOLECALCIFEROL (VITAMIN D3) 1000 UNITS CAPS    Take 2,000 Units by mouth daily.   DOCUSATE CALCIUM (STOOL SOFTENER PO)    Take 100 mg by mouth 2 (two) times daily.    FLAXSEED, LINSEED, (FLAX SEED OIL) 1300 MG CAPS    Take by mouth daily.   MINOCYCLINE (DYNACIN) 100 MG TABLET    Take 100 mg by mouth 1 day or 1 dose.   OMEPRAZOLE (PRILOSEC) 40 MG CAPSULE    TAKE 1 CAPSULE (40 MG TOTAL) BY MOUTH 2  TIMES DAILY.   PROBIOTIC PRODUCT (PROBIOTIC DAILY PO)    Take 1 capsule by mouth daily.   TURMERIC PO    Take 2,000 mg by mouth daily.    VITAMIN B-12 (CYANOCOBALAMIN) 1000 MCG TABLET    Take 2,000 mcg by mouth daily.  Modified Medications   No medications on file  Discontinued Medications   No medications on file    Subjective: Kiara Medina is in for her routine follow-up visit. She has been back on azithromycin, ethambutol and rifampin for 2 weeks now for her relapsed Mycobacterium avium pneumonia. Antibiotic  susceptibilities are not back yet. She says that her night sweats have resolved and she has not had any episodes of hemoptysis since her last visit. There has been no more change in her cough or baseline shortness of breath. She does feel a little total bit more fatigued since starting antibiotics and has noted some anorexia. She's also been back on minocycline for the past month for acne. She is planning on going on a weeklong cruise to [place] starting on 08/28/2017.  Review of Systems: Review of Systems  Constitutional: Positive for malaise/fatigue and weight loss. Negative for chills, diaphoresis and fever.  HENT: Negative for sore throat.   Respiratory: Positive for cough, sputum production and shortness of breath. Negative for hemoptysis.   Cardiovascular: Negative for chest pain.  Gastrointestinal: Negative for abdominal pain, diarrhea, heartburn, nausea and vomiting.       Anorexia.  Genitourinary: Negative for dysuria and frequency.  Musculoskeletal: Negative for joint pain and myalgias.  Skin: Negative for rash.  Neurological: Negative for dizziness and headaches.    Past Medical History:  Diagnosis Date  . Allergic rhinitis, cause unspecified   . Bronchiectasis without acute exacerbation ([place])   . Cough   . Sinusitis  Social History  Substance Use Topics  . Smoking status: Former Smoker    Packs/day: 1.00    Years: 20.00    Types: Cigarettes    Quit date: 12/14/1985  . Smokeless tobacco: Never Used  . Alcohol use 0.6 oz/week    1 Glasses of wine per week    Family History  Problem Relation Age of Onset  . Emphysema Sister   . Stroke Mother   . Clotting disorder Mother     No Known Allergies  Objective: There were no vitals filed for this visit. There is no height or weight on file to calculate BMI.  Physical Exam  Constitutional: She is oriented to person, place, and time.  She is in good spirits.  Cardiovascular: Normal rate and regular rhythm.     No murmur heard. Pulmonary/Chest: Effort normal and breath sounds normal. She has no wheezes. She has no rales.  Abdominal: Soft. There is no tenderness.  Neurological: She is alert and oriented to person, place, and time.  Skin: No rash noted.  Psychiatric: Mood and affect normal.    Lab Results    Problem List Items Addressed This Visit      High   Mycobacterium avium-intracellulare complex (Kiara Medina)    History early to expect much improvement in her relapsed Mycobacterium avium pneumonia. I will obtain repeat sputum AFB stain and culture. I am awaiting antibiotic susceptibility results. She will follow-up in one month.          Michel Bickers, MD [facility] for [facility] [place] [facility] [phone] pager   [phone] cell 07/22/2017, 8:11 AM

## 2017-08-23 ENCOUNTER — Other Ambulatory Visit: Payer: Self-pay | Admitting: Pulmonary Disease

## 2017-08-25 DIAGNOSIS — H16102 Unspecified superficial keratitis, left eye: Secondary | ICD-10-CM | POA: Diagnosis not present

## 2017-08-25 DIAGNOSIS — H2511 Age-related nuclear cataract, right eye: Secondary | ICD-10-CM | POA: Diagnosis not present

## 2017-08-25 DIAGNOSIS — Z9842 Cataract extraction status, left eye: Secondary | ICD-10-CM | POA: Diagnosis not present

## 2017-08-25 DIAGNOSIS — B009 Herpesviral infection, unspecified: Secondary | ICD-10-CM | POA: Diagnosis not present

## 2017-08-25 DIAGNOSIS — H11822 Conjunctivochalasis, left eye: Secondary | ICD-10-CM | POA: Diagnosis not present

## 2017-08-25 DIAGNOSIS — Z961 Presence of intraocular lens: Secondary | ICD-10-CM | POA: Diagnosis not present

## 2017-08-25 LAB — OTHER SOLSTAS TEST

## 2017-08-26 ENCOUNTER — Other Ambulatory Visit: Payer: Self-pay | Admitting: Internal Medicine

## 2017-08-26 ENCOUNTER — Encounter: Payer: Self-pay | Admitting: Internal Medicine

## 2017-08-26 ENCOUNTER — Ambulatory Visit (INDEPENDENT_AMBULATORY_CARE_PROVIDER_SITE_OTHER): Payer: Medicare HMO | Admitting: Internal Medicine

## 2017-08-26 DIAGNOSIS — A310 Pulmonary mycobacterial infection: Secondary | ICD-10-CM | POA: Diagnosis not present

## 2017-08-26 DIAGNOSIS — Z23 Encounter for immunization: Secondary | ICD-10-CM | POA: Diagnosis not present

## 2017-08-26 LAB — CBC
HEMATOCRIT: 43.7 % (ref 35.0–45.0)
HEMOGLOBIN: 14.9 g/dL (ref 11.7–15.5)
MCH: 30.8 pg (ref 27.0–33.0)
MCHC: 34.1 g/dL (ref 32.0–36.0)
MCV: 90.5 fL (ref 80.0–100.0)
MPV: 11.5 fL (ref 7.5–12.5)
Platelets: 152 10*3/uL (ref 140–400)
RBC: 4.83 10*6/uL (ref 3.80–5.10)
RDW: 13.9 % (ref 11.0–15.0)
WBC: 5.2 10*3/uL (ref 3.8–10.8)

## 2017-08-26 LAB — COMPREHENSIVE METABOLIC PANEL
AG Ratio: 1.7 (calc) (ref 1.0–2.5)
ALBUMIN MSPROF: 3.8 g/dL (ref 3.6–5.1)
ALT: 14 U/L (ref 6–29)
AST: 23 U/L (ref 10–35)
Alkaline phosphatase (APISO): 76 U/L (ref 33–130)
BUN: 17 mg/dL (ref 7–25)
CHLORIDE: 106 mmol/L (ref 98–110)
CO2: 29 mmol/L (ref 20–32)
Calcium: 9.2 mg/dL (ref 8.6–10.4)
Creat: 0.76 mg/dL (ref 0.60–0.93)
GLOBULIN: 2.3 g/dL (ref 1.9–3.7)
GLUCOSE: 94 mg/dL (ref 65–99)
POTASSIUM: 4.2 mmol/L (ref 3.5–5.3)
SODIUM: 141 mmol/L (ref 135–146)
TOTAL PROTEIN: 6.1 g/dL (ref 6.1–8.1)
Total Bilirubin: 0.4 mg/dL (ref 0.2–1.2)

## 2017-08-26 NOTE — Progress Notes (Signed)
[place] for Infectious Disease  Patient Active Problem List   Diagnosis Date Noted  . 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 BRONCHIECTASIS 05/23/2009    Priority: Medium  . Right shoulder pain 01/21/2017  . Encounter for hepatitis C screening test for low risk patient 01/21/2017  . GERD (gastroesophageal reflux disease) 02/21/2016  . 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 Dyslipidemia 11/14/2014  . ALLERGIC RHINITIS 05/03/2009    Patient's Medications  New Prescriptions   No medications on file  Previous Medications   ACYCLOVIR (ZOVIRAX) 400 MG TABLET    Take 1 tablet (400 mg total) by mouth 5 (five) times daily.   ATORVASTATIN (LIPITOR) 20 MG TABLET    Take 20 mg by mouth daily.   B COMPLEX VITAMINS (B COMPLEX-B12 PO)    Take 1 tablet by mouth daily.   BIOTIN 5000 PO    Take 2 capsules by mouth daily.   CALCIUM CARBONATE-VITAMIN D (CALCIUM-VITAMIN D3) 600-200 MG-UNIT TABS    Take 1 tablet by mouth 2 (two) times daily.     CHOLECALCIFEROL (VITAMIN D3) 1000 UNITS CAPS    Take 2,000 Units by mouth daily.   DOCUSATE CALCIUM (STOOL SOFTENER PO)    Take 100 mg by mouth 2 (two) times daily.    FLAXSEED, LINSEED, (FLAX SEED OIL) 1300 MG CAPS    Take by mouth daily.   MINOCYCLINE (DYNACIN) 100 MG TABLET    Take 100 mg by mouth 1 day or 1 dose.   OMEPRAZOLE (PRILOSEC) 40 MG CAPSULE    TAKE 1 CAPSULE TWICE DAILY   PROBIOTIC PRODUCT (PROBIOTIC DAILY PO)    Take 1 capsule by mouth daily.   TURMERIC PO    Take 2,000 mg by mouth daily.    VITAMIN B-12 (CYANOCOBALAMIN) 1000 MCG TABLET    Take 2,000 mcg by mouth daily.  Modified Medications   No medications on file  Discontinued Medications   No medications on file    Subjective: Kiara Medina is in for her routine follow-up visit. She has now been on azithromycin, ethambutol and rifampin for the past 6 weeks after developing a relapse of her Mycobacterium avium pneumonia. She says she's not  having as much stomach upset from her medications as she was when she took it a year and a half ago. She is feeling better. She is not coughing nearly as much and rarely brings up any sputum. She is still having occasional night sweats. Her baseline dyspnea on exertion is unchanged. Her husband recently developed acute renal failure and was admitted to [facility]. He was found to have bladder outlet obstruction due to prostatic enlargement. His renal function is improving now with Foley catheter. He will be discharged home next week and will be set up for prostate surgery within the next month. She has been more fatigued and under stress since he has been hospitalized.  Review of Systems: Review of Systems  Constitutional: Positive for diaphoresis and malaise/fatigue. Negative for chills, fever and weight loss.  Eyes: Negative for blurred vision.  Respiratory: Positive for cough, sputum production and shortness of breath.   Cardiovascular: Negative for chest pain.  Gastrointestinal: Negative for abdominal pain, diarrhea, nausea and vomiting.  Skin: Negative for rash.    Past Medical History:  Diagnosis Date  . Allergic rhinitis, cause unspecified   . Bronchiectasis without acute exacerbation ([place])   . Cough   . Sinusitis  Social History  Substance Use Topics  . Smoking status: Former Smoker    Packs/day: 1.00    Years: 20.00    Types: Cigarettes    Quit date: 12/14/1985  . Smokeless tobacco: Never Used  . Alcohol use 0.6 oz/week    1 Glasses of wine per week     Comment: occ    Family History  Problem Relation Age of Onset  . Emphysema Sister   . Stroke Mother   . Clotting disorder Mother     No Known Allergies  Objective: Vitals:   08/26/17 0848  BP: 133/73  Pulse: 93  Temp: (!) 97.5 F (36.4 C)  TempSrc: Oral  Weight: 132 lb (59.9 kg)   Body mass index is 23.38 kg/m.  Physical Exam  Constitutional: She is oriented to person, place, and  time.  She is in good spirits  Cardiovascular: Normal rate and regular rhythm.   No murmur heard. Pulmonary/Chest: Effort normal and breath sounds normal. She has no wheezes. She has no rales.  Abdominal: Soft. She exhibits no distension. There is no tenderness.  Neurological: She is alert and oriented to person, place, and time.  Skin: No rash noted.  Psychiatric: Mood and affect normal.    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 She is tolerating her current regimen relatively well. Unfortunately her isolate has developed multidrug resistance. It is resistant to clarithromycin and therefore azithromycin. It is also resistant to rifampin. It remains susceptible to ethambutol, rifabutin and amikacin. There is intermediate resistance to moxifloxacin.  This has become a very difficult situation. It is very difficult to treat and cure multidrug resistant Mycobacterium avium pneumonia. Treatment options include ethambutol, rifabutin, clofazimine and IV or aerosol amikacin (question new liposomal preparation). I reviewed the situation with Dr. Brigitte Pulse at [facility]. He has offered to see her in consultation and Kiara Medina is interested in pursuing that. I will make a referral now. I had suggested to [place] that we get a chest CT to establish a new baseline but she would prefer to wait until she sees Dr. Lorenda Cahill. I will have her stop her current 3 drug regimen       Relevant Orders   CBC (Completed)   Comprehensive metabolic panel (Completed)   AFB Culture & Smear    MYCOBACTERIA, CULTURE, WITH FLUOROCHROME SMEAR    Other Visit Diagnoses    Need for immunization against influenza       Relevant Orders   Flu Vaccine QUAD 36+ mos IM (Completed)       Michel Bickers, MD [facility] for [place] [phone] pager   [phone] cell 10/01/2017, 12:32 PM

## 2017-08-26 NOTE — Assessment & Plan Note (Addendum)
She is feeling better since restarting therapy for Mycobacterium avium pneumonia. She is tolerating her current regimen relatively well. Unfortunately her isolate has developed multidrug resistance. It is resistant to clarithromycin and therefore azithromycin. It is also resistant to rifampin. It remains susceptible to ethambutol, rifabutin and amikacin. There is intermediate resistance to moxifloxacin.  This has become a very difficult situation. It is very difficult to treat and cure multidrug resistant Mycobacterium avium pneumonia. Treatment options include ethambutol, rifabutin, clofazimine and IV or aerosol amikacin (question new liposomal preparation). I reviewed the situation with Dr. Brigitte Pulse at [facility]. He has offered to see her in consultation and Janaysha is interested in pursuing that. I will make a referral now. I had suggested to [place] that we get a chest CT to establish a new baseline but she would prefer to wait until she sees Dr. Lorenda Cahill. I will have her stop her current 3 drug regimen

## 2017-08-27 ENCOUNTER — Other Ambulatory Visit: Payer: Self-pay | Admitting: Pharmacist

## 2017-09-07 DIAGNOSIS — M19072 Primary osteoarthritis, left ankle and foot: Secondary | ICD-10-CM | POA: Diagnosis not present

## 2017-09-07 DIAGNOSIS — Z1389 Encounter for screening for other disorder: Secondary | ICD-10-CM | POA: Diagnosis not present

## 2017-09-08 ENCOUNTER — Telehealth: Payer: Self-pay | Admitting: *Deleted

## 2017-09-08 DIAGNOSIS — Z1389 Encounter for screening for other disorder: Secondary | ICD-10-CM | POA: Diagnosis not present

## 2017-09-08 NOTE — Telephone Encounter (Signed)
Patient is calling to see if her medications are changing. She will need to order refills in 1 week. She uses [facility]. Please advise if her regimen is changing. Landis Gandy, RN

## 2017-09-08 NOTE — Telephone Encounter (Signed)
I called [place] and let her know that I am still waiting to hear back from Dr. Brigitte Pulse.

## 2017-09-13 DIAGNOSIS — M19072 Primary osteoarthritis, left ankle and foot: Secondary | ICD-10-CM | POA: Diagnosis not present

## 2017-09-13 DIAGNOSIS — M1990 Unspecified osteoarthritis, unspecified site: Secondary | ICD-10-CM | POA: Diagnosis not present

## 2017-09-15 ENCOUNTER — Other Ambulatory Visit: Payer: Self-pay | Admitting: Pharmacist

## 2017-09-15 ENCOUNTER — Other Ambulatory Visit: Payer: Self-pay | Admitting: Internal Medicine

## 2017-09-15 ENCOUNTER — Telehealth: Payer: Self-pay | Admitting: Internal Medicine

## 2017-09-15 DIAGNOSIS — A310 Pulmonary mycobacterial infection: Secondary | ICD-10-CM

## 2017-09-15 NOTE — Telephone Encounter (Signed)
I called Kiara Medina today to let her know that I am ordering a chest CT scan to help establish a new baseline for her relapsed Mycobacterium avium pneumonia. I'm also working with our ID pharmacist, Magda Kiel, to see if we can get approval for a new antibiotic regimen consisting of ethambutol, rifabutin, clofazimine and inhaled amikacin.

## 2017-09-16 DIAGNOSIS — M19072 Primary osteoarthritis, left ankle and foot: Secondary | ICD-10-CM | POA: Diagnosis not present

## 2017-09-16 DIAGNOSIS — M1990 Unspecified osteoarthritis, unspecified site: Secondary | ICD-10-CM | POA: Diagnosis not present

## 2017-09-20 ENCOUNTER — Other Ambulatory Visit: Payer: Self-pay | Admitting: Pharmacist

## 2017-09-21 ENCOUNTER — Other Ambulatory Visit: Payer: Self-pay | Admitting: Pharmacist

## 2017-09-22 ENCOUNTER — Other Ambulatory Visit: Payer: Self-pay | Admitting: Pharmacist

## 2017-09-23 ENCOUNTER — Telehealth: Payer: Self-pay | Admitting: Internal Medicine

## 2017-09-23 DIAGNOSIS — M19072 Primary osteoarthritis, left ankle and foot: Secondary | ICD-10-CM | POA: Diagnosis not present

## 2017-09-23 DIAGNOSIS — M1990 Unspecified osteoarthritis, unspecified site: Secondary | ICD-10-CM | POA: Diagnosis not present

## 2017-09-23 NOTE — Telephone Encounter (Signed)
I called to Kiara Medina today to let her know that we are working on getting a new, salvage antibiotic regimen for her relapse Mycobacterium avium pneumonia. The application for clofazimine has been sent. We are also investigating the possibility of getting the new liposomal amikacin reparation for inhaled use. I told her that when she runs out of her current supply of azithromycin, ethambutol and rifampin early next week to simply stop and see how she does off of antibiotics.

## 2017-09-28 LAB — MYCOBACTERIA,CULT W/FLUOROCHROME SMEAR
MICRO NUMBER:: 81012244
SPECIMEN QUALITY: ADEQUATE

## 2017-10-05 ENCOUNTER — Telehealth: Payer: Self-pay | Admitting: *Deleted

## 2017-10-05 NOTE — Telephone Encounter (Signed)
Patient called to advise she has an appointment next week 10/12/17 and wants to know if she needs to keep it since the doctor is referring her to [facility]. She also advised she has called [facility] and was told that they are scheduling out until end of November and she wants Dr Megan Salon to call and get her a sooner date if he can. She advised she is off of medication and does not want to get sick while she waits. Advised her will send the doctor a message and call her back once he responds.

## 2017-10-05 NOTE — Telephone Encounter (Signed)
Please let Kiara Medina know that it's okay to cancel her appointment with me on 10/12/2017. Unfortunately, Dr. Romie Levee schedule is very busy. I have asked them to call [name] if they have any cancellations so you can be seen sooner. Please ask her to let me know if she has any flare in her symptoms before her visit at [facility].

## 2017-10-12 ENCOUNTER — Ambulatory Visit: Payer: Medicare HMO | Admitting: Internal Medicine

## 2017-10-12 ENCOUNTER — Telehealth: Payer: Self-pay

## 2017-10-12 NOTE — Telephone Encounter (Signed)
Kiara Medina is calling to request actual film of CT scan @ [facility].    I have contacted radiology and they need a request for release of film.   I have notified Kiara Medina to fax the request to radiolgoy and they will send the film.     Tammy K King,RN

## 2017-10-13 DIAGNOSIS — H2511 Age-related nuclear cataract, right eye: Secondary | ICD-10-CM | POA: Diagnosis not present

## 2017-10-13 DIAGNOSIS — H02403 Unspecified ptosis of bilateral eyelids: Secondary | ICD-10-CM | POA: Diagnosis not present

## 2017-10-13 DIAGNOSIS — H16102 Unspecified superficial keratitis, left eye: Secondary | ICD-10-CM | POA: Diagnosis not present

## 2017-10-13 DIAGNOSIS — H11822 Conjunctivochalasis, left eye: Secondary | ICD-10-CM | POA: Diagnosis not present

## 2017-10-13 DIAGNOSIS — B009 Herpesviral infection, unspecified: Secondary | ICD-10-CM | POA: Diagnosis not present

## 2017-10-13 DIAGNOSIS — Z961 Presence of intraocular lens: Secondary | ICD-10-CM | POA: Diagnosis not present

## 2017-10-13 DIAGNOSIS — Z9842 Cataract extraction status, left eye: Secondary | ICD-10-CM | POA: Diagnosis not present

## 2017-11-01 DIAGNOSIS — A310 Pulmonary mycobacterial infection: Secondary | ICD-10-CM | POA: Diagnosis not present

## 2017-11-10 ENCOUNTER — Encounter: Payer: Self-pay | Admitting: Internal Medicine

## 2017-11-10 ENCOUNTER — Ambulatory Visit: Payer: Medicare HMO | Admitting: Internal Medicine

## 2017-11-10 DIAGNOSIS — A310 Pulmonary mycobacterial infection: Secondary | ICD-10-CM

## 2017-11-10 MED ORDER — AMIKACIN SULFATE LIPOSOME 590 MG/8.4ML IN SUSP: 590.0000 mg | Freq: Every day | RESPIRATORY_TRACT | 11 refills | Status: DC

## 2017-11-10 MED ORDER — AZITHROMYCIN 250 MG PO TABS: 250.0000 mg | ORAL_TABLET | Freq: Every day | ORAL | 11 refills | Status: DC

## 2017-11-10 MED ORDER — RIFAMPIN 300 MG PO CAPS: 600.0000 mg | ORAL_CAPSULE | Freq: Every day | ORAL | 11 refills | Status: DC

## 2017-11-10 MED ORDER — ETHAMBUTOL HCL 400 MG PO TABS: 1200.0000 mg | ORAL_TABLET | Freq: Every day | ORAL | 11 refills | Status: DC

## 2017-11-10 NOTE — Progress Notes (Signed)
HPI: Kiara Medina is a 71 y.o. female who is here to see Dr. Megan Salon for her MAC infection.   Allergies: No Known Allergies  Vitals: Temp: 97.7 F (36.5 C) (11/28 1531) Temp Source: Oral (11/28 1531) BP: 136/78 (11/28 1531) Pulse Rate: 96 (11/28 1531)  Past Medical History: Past Medical History:  Diagnosis Date  . Allergic rhinitis, cause unspecified   . Bronchiectasis without acute exacerbation ([place])   . Cough   . Sinusitis     Social History: Social History   Socioeconomic History  . Marital status: Married    Spouse name: None  . Number of children: None  . Years of education: None  . Highest education level: None  Social Needs  . Financial resource strain: None  . Food insecurity - worry: None  . Food insecurity - inability: None  . Transportation needs - medical: None  . Transportation needs - non-medical: None  Occupational History  . Occupation: realtor  Tobacco Use  . Smoking status: Former Smoker    Packs/day: 1.00    Years: 20.00    Pack years: 20.00    Types: Cigarettes    Last attempt to quit: 12/14/1985    Years since quitting: 31.9  . Smokeless tobacco: Never Used  Substance and Sexual Activity  . Alcohol use: Yes    Alcohol/week: 0.6 oz    Types: 1 Glasses of wine per week    Comment: occ  . Drug use: No  . Sexual activity: None  Other Topics Concern  . None  Social History Narrative  . None    Previous Regimen:   Current Regimen: Azit/RIF/ETHB  Labs: No results found for: HIV1RNAQUANT, HIV1RNAVL, CD4TABS, HEPBSAB, HEPBSAG, HCVAB  CrCl: CrCl cannot be calculated (Patient's most recent lab result is older than the maximum 21 days allowed.).  Lipids: No results found for: CHOL, TRIG, HDL, CHOLHDL, VLDL, LDLCALC  Assessment: [place] is here for her appt with Dr. Megan Salon about MAC. She has been previously treated with a similar regimen in the past and relapse. She has multiple resistance isolate already including macrolide. She saw   Dr. Lorenda Cahill recently and the plan is to resume her regimen and add on inhaled Amikacin (Arikace). This product will be very expensive so we will start the process today. It'll take at least several weeks to hear back from them. Counseled her on the drug and process. Since she will need saline nebs too, the Pari nebulizer machine is compatible with both. (Verified with company)  Recommendations:  Add Arikace IH to current regimen  Onnie Boer, PharmD, BCPS, AAHIVP, CPP Clinical Infectious [place] for Infectious Disease 11/10/2017, 4:24 PM

## 2017-11-10 NOTE — Progress Notes (Signed)
[facility] for Infectious Disease  Patient Active Problem List   Diagnosis Date Noted  . 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 BRONCHIECTASIS 05/23/2009    Priority: Medium  . Right shoulder pain 01/21/2017  . Encounter for hepatitis C screening test for low risk patient 01/21/2017  . GERD (gastroesophageal reflux disease) 02/21/2016  . 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 Dyslipidemia 11/14/2014  . ALLERGIC RHINITIS 05/03/2009      Medication List        Accurate as of 11/10/17  4:38 PM. Always use your most recent med list.          acyclovir 400 MG tablet Commonly known as:  ZOVIRAX Take 1 tablet (400 mg total) by mouth 5 (five) times daily.   Amikacin Sulfate Liposome 590 MG/8.4ML Susp Commonly known as:  ARIKAYCE Inhale 590 mg into the lungs daily.   atorvastatin 20 MG tablet Commonly known as:  LIPITOR   azithromycin 250 MG tablet Commonly known as:  ZITHROMAX Take 1 tablet (250 mg total) by mouth daily.   B COMPLEX-B12 PO   BIOTIN 5000 PO   Calcium-Vitamin D3 600-200 MG-UNIT Tabs   ethambutol 400 MG tablet Commonly known as:  MYAMBUTOL Take 3 tablets (1,200 mg total) by mouth daily.   Flax Seed Oil 1300 MG Caps   minocycline 100 MG tablet Commonly known as:  DYNACIN   omeprazole 40 MG capsule Commonly known as:  PRILOSEC TAKE 1 CAPSULE TWICE DAILY   PROBIOTIC DAILY PO   rifampin 300 MG capsule Commonly known as:  RIFADIN Take 2 capsules (600 mg total) by mouth daily.   STOOL SOFTENER PO   TURMERIC PO   vitamin B-12 1000 MCG tablet Commonly known as:  CYANOCOBALAMIN   Vitamin D3 1000 units Caps       Where to Get Your Medications    These medications were sent to [facility], [place], [place] [address]   Phone:  [phone]   Amikacin Sulfate Liposome 590 MG/8.4ML Susp  azithromycin 250 MG  tablet  ethambutol 400 MG tablet  rifampin 300 MG capsule     Subjective: Khaleah is in for her routine follow-up visit.She has been off of azithromycin, ethambutol and rifampin for the past month. She met with Dr. Brigitte Pulse at [facility] on 10/19/2017. He has recommended a new salvage regimen for her macrolide resistant Mycobacterium avium pneumonia consisting of daily doses of azithromycin, ethambutol and rifampin. He suggested continuing the azithromycin for its anti-inflammatory effects.We will also see if we can get approval for the new inhaled liposomal amikacin product  She has noted some gradual worsening of her dyspnea on exertion over the past several months. She says that her cough comes in spells. She cannot tell any definite worsening in her cough over the past month normally her cough is dry and nonproductive but occasionally she brings up some sputum. She's had no more recent episodes of hemoptysis. She has not had any documented fever but does have some mild night sweats. Her appetite is good.  Review of Systems: Review of Systems  Constitutional: Positive for diaphoresis. Negative for chills, fever, malaise/fatigue and weight loss.  HENT: Positive for tinnitus. Negative for hearing loss and sore throat.   Respiratory: Positive for cough, sputum production and shortness of breath. Negative for hemoptysis and wheezing.   Cardiovascular:  Negative for chest pain.  Gastrointestinal: Negative for abdominal pain, diarrhea, heartburn, nausea and vomiting.  Skin: Negative for rash.    Past Medical History:  Diagnosis Date  . Allergic rhinitis, cause unspecified   . Bronchiectasis without acute exacerbation ([place])   . Cough   . Sinusitis     Social History   Tobacco Use  . Smoking status: Former Smoker    Packs/day: 1.00    Years: 20.00    Pack years: 20.00    Types: Cigarettes    Last attempt to quit: 12/14/1985    Years since quitting: 31.9  .  Smokeless tobacco: Never Used  Substance Use Topics  . Alcohol use: Yes    Alcohol/week: 0.6 oz    Types: 1 Glasses of wine per week    Comment: occ  . Drug use: No    Family History  Problem Relation Age of Onset  . Emphysema Sister   . Stroke Mother   . Clotting disorder Mother     No Known Allergies  Objective: Vitals:   11/10/17 1531  BP: 136/78  Pulse: 96  Temp: 97.7 F (36.5 C)  TempSrc: Oral  Weight: 136 lb (61.7 kg)  Height: 5' 3.5" (1.613 m)   Body mass index is 23.71 kg/m.  Physical Exam  Constitutional: She is oriented to person, place, and time.  She is in good spirits as usual.  Cardiovascular: Normal rate and regular rhythm.  No murmur heard. Pulmonary/Chest: Effort normal and breath sounds normal. She has no wheezes. She has no rales.  Neurological: She is alert and oriented to person, place, and time.  Skin: No rash noted.  Psychiatric: Mood and affect normal.    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 She will be meeting with her insurance broker tomorrow to review Medicare Advantage plans in the local market. Assuming we can get Arikace, she will take an inhaled dose of that on a daily basis. I will also start her on nebulized saline treatments for pulmonary toilet in addition to her oral azithromycin, ethambutol and rifampin. I prefer to start all of these medications together once Arikace is improved. I will obtain a baseline audiometry. I will also have her obtain sputum for repeat AFB culture now. She just recently saw her ophthalmologist and has regular visits scheduled with him. She will follow-up here in 6 weeks.      Relevant Medications   azithromycin (ZITHROMAX) 250 MG tablet   rifampin (RIFADIN) 300 MG capsule    ethambutol (MYAMBUTOL) 400 MG tablet   Amikacin Sulfate Liposome (ARIKAYCE) 590 MG/8.4ML SUSP   Other Relevant Orders   MYCOBACTERIA, CULTURE, WITH FLUOROCHROME SMEAR   PR COMPREHENSIVE HEARING TEST       Michel Bickers, MD [facility] [phone] pager   [phone] cell 11/10/2017, 4:38 PM

## 2017-11-10 NOTE — Assessment & Plan Note (Signed)
I had Kiara Medina meet with Onnie Boer, our pharmacist today, to start the process of trying to obtain the new liposomal amikacin product, Arikace. She will be meeting with her insurance broker tomorrow to review Medicare Advantage plans in the local market. Assuming we can get Arikace, she will take an inhaled dose of that on a daily basis. I will also start her on nebulized saline treatments for pulmonary toilet in addition to her oral azithromycin, ethambutol and rifampin. I prefer to start all of these medications together once Arikace is improved. I will obtain a baseline audiometry. I will also have her obtain sputum for repeat AFB culture now. She just recently saw her ophthalmologist and has regular visits scheduled with him. She will follow-up here in 6 weeks.

## 2017-11-17 ENCOUNTER — Telehealth: Payer: Self-pay | Admitting: Pharmacist Clinician (PhC)/ Clinical Pharmacy Specialist

## 2017-11-17 NOTE — Telephone Encounter (Signed)
Called [employer] to let them know of the approval so they can start the process for training and delivery. Approval is located under Chart Review>>Media

## 2017-11-18 ENCOUNTER — Telehealth: Payer: Self-pay | Admitting: *Deleted

## 2017-11-18 NOTE — Telephone Encounter (Signed)
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  She would like to bring this to her next appointment. Landis Gandy, RN

## 2017-11-23 NOTE — Telephone Encounter (Signed)
I will be happy to write Gerarda a note indicating that she needs to be out of work.

## 2017-11-24 NOTE — Telephone Encounter (Signed)
Patient will bring by sample letter that Delora [employer] is requesting.

## 2017-11-25 ENCOUNTER — Telehealth: Payer: Self-pay | Admitting: Pharmacist Clinician (PhC)/ Clinical Pharmacy Specialist

## 2017-11-25 NOTE — Telephone Encounter (Signed)
Kiara Medina has been approved for Arikace, however, the copay will be $2000/mo. She will delay treatment until the beginning of the year because she will be retired by then. Maybe the copay will be less of an issue then due to income and they can go through patient assistance.

## 2017-11-26 DIAGNOSIS — D2112 Benign neoplasm of connective and other soft tissue of left upper limb, including shoulder: Secondary | ICD-10-CM | POA: Diagnosis not present

## 2017-11-26 DIAGNOSIS — M79641 Pain in right hand: Secondary | ICD-10-CM | POA: Diagnosis not present

## 2017-11-26 DIAGNOSIS — M65311 Trigger thumb, right thumb: Secondary | ICD-10-CM | POA: Diagnosis not present

## 2017-11-26 DIAGNOSIS — M19041 Primary osteoarthritis, right hand: Secondary | ICD-10-CM | POA: Diagnosis not present

## 2017-11-29 ENCOUNTER — Encounter: Payer: Self-pay | Admitting: *Deleted

## 2017-11-29 NOTE — Telephone Encounter (Signed)
Patient brought sample letter today.  Dr Megan Salon reviewed sample, approved and signed a letter for patient. Will fax Dr Hale Bogus letter to patient at her work 12/18 at her request.

## 2017-12-02 DIAGNOSIS — Z6823 Body mass index (BMI) 23.0-23.9, adult: Secondary | ICD-10-CM | POA: Diagnosis not present

## 2017-12-02 DIAGNOSIS — R7301 Impaired fasting glucose: Secondary | ICD-10-CM | POA: Diagnosis not present

## 2017-12-02 DIAGNOSIS — E785 Hyperlipidemia, unspecified: Secondary | ICD-10-CM | POA: Diagnosis not present

## 2017-12-02 DIAGNOSIS — A310 Pulmonary mycobacterial infection: Secondary | ICD-10-CM | POA: Diagnosis not present

## 2017-12-02 DIAGNOSIS — K219 Gastro-esophageal reflux disease without esophagitis: Secondary | ICD-10-CM | POA: Diagnosis not present

## 2017-12-02 DIAGNOSIS — Z23 Encounter for immunization: Secondary | ICD-10-CM | POA: Diagnosis not present

## 2017-12-02 DIAGNOSIS — J479 Bronchiectasis, uncomplicated: Secondary | ICD-10-CM | POA: Diagnosis not present

## 2017-12-02 DIAGNOSIS — E559 Vitamin D deficiency, unspecified: Secondary | ICD-10-CM | POA: Diagnosis not present

## 2017-12-02 DIAGNOSIS — Z139 Encounter for screening, unspecified: Secondary | ICD-10-CM | POA: Diagnosis not present

## 2017-12-13 DIAGNOSIS — E785 Hyperlipidemia, unspecified: Secondary | ICD-10-CM | POA: Diagnosis not present

## 2017-12-13 DIAGNOSIS — Z0000 Encounter for general adult medical examination without abnormal findings: Secondary | ICD-10-CM | POA: Diagnosis not present

## 2017-12-13 DIAGNOSIS — Z9181 History of falling: Secondary | ICD-10-CM | POA: Diagnosis not present

## 2017-12-13 DIAGNOSIS — Z139 Encounter for screening, unspecified: Secondary | ICD-10-CM | POA: Diagnosis not present

## 2017-12-13 DIAGNOSIS — Z1331 Encounter for screening for depression: Secondary | ICD-10-CM | POA: Diagnosis not present

## 2017-12-13 DIAGNOSIS — Z136 Encounter for screening for cardiovascular disorders: Secondary | ICD-10-CM | POA: Diagnosis not present

## 2017-12-30 ENCOUNTER — Ambulatory Visit: Payer: Medicare HMO | Admitting: Internal Medicine

## 2017-12-30 ENCOUNTER — Ambulatory Visit
Admission: RE | Admit: 2017-12-30 | Discharge: 2017-12-30 | Disposition: A | Discharge status: Home / Self Care | Payer: Medicare HMO | Source: Ambulatory Visit | Attending: Internal Medicine | Admitting: Internal Medicine

## 2017-12-30 ENCOUNTER — Encounter: Payer: Self-pay | Admitting: Internal Medicine

## 2017-12-30 DIAGNOSIS — A310 Pulmonary mycobacterial infection: Secondary | ICD-10-CM

## 2017-12-30 DIAGNOSIS — J984 Other disorders of lung: Secondary | ICD-10-CM | POA: Diagnosis not present

## 2017-12-30 MED ORDER — SODIUM CHLORIDE 3 % IN NEBU: INHALATION_SOLUTION | RESPIRATORY_TRACT | 12 refills | Status: DC

## 2017-12-30 NOTE — Progress Notes (Signed)
[facility] for Infectious Disease  Patient Active Problem List   Diagnosis Date Noted  . 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 BRONCHIECTASIS 05/23/2009    Priority: Medium  . Right shoulder pain 01/21/2017  . Encounter for hepatitis C screening test for low risk patient 01/21/2017  . GERD (gastroesophageal reflux disease) 02/21/2016  . 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 Dyslipidemia 11/14/2014  . ALLERGIC RHINITIS 05/03/2009    Patient's Medications  New Prescriptions   No medications on file  Previous Medications   ACYCLOVIR (ZOVIRAX) 400 MG TABLET    Take 1 tablet (400 mg total) by mouth 5 (five) times daily.   AMIKACIN SULFATE LIPOSOME (ARIKAYCE) 590 MG/8.4ML SUSP    Inhale 590 mg into the lungs daily.   ATORVASTATIN (LIPITOR) 20 MG TABLET    Take 20 mg by mouth daily.   AZITHROMYCIN (ZITHROMAX) 250 MG TABLET    Take 1 tablet (250 mg total) by mouth daily.   B COMPLEX VITAMINS (B COMPLEX-B12 PO)    Take 1 tablet by mouth daily.   BIOTIN 5000 PO    Take 2 capsules by mouth daily.   CALCIUM CARBONATE-VITAMIN D (CALCIUM-VITAMIN D3) 600-200 MG-UNIT TABS    Take 1 tablet by mouth 2 (two) times daily.     CHOLECALCIFEROL (VITAMIN D3) 1000 UNITS CAPS    Take 2,000 Units by mouth daily.   DOCUSATE CALCIUM (STOOL SOFTENER PO)    Take 100 mg by mouth 2 (two) times daily.    ETHAMBUTOL (MYAMBUTOL) 400 MG TABLET    Take 3 tablets (1,200 mg total) by mouth daily.   FLAXSEED, LINSEED, (FLAX SEED OIL) 1300 MG CAPS    Take by mouth daily.   MINOCYCLINE (DYNACIN) 100 MG TABLET    Take 100 mg by mouth 1 day or 1 dose.   OMEPRAZOLE (PRILOSEC) 40 MG CAPSULE    TAKE 1 CAPSULE TWICE DAILY   PROBIOTIC PRODUCT (PROBIOTIC DAILY PO)    Take 1 capsule by mouth daily.   RIFAMPIN (RIFADIN) 300 MG CAPSULE    Take 2 capsules (600 mg total) by mouth daily.   TURMERIC PO    Take 2,000 mg by mouth daily.    VITAMIN B-12 (CYANOCOBALAMIN) 1000 MCG TABLET     Take 2,000 mcg by mouth daily.  Modified Medications   No medications on file  Discontinued Medications   No medications on file    Subjective: Kiara Medina is in for her routine follow-up visit.  She has relapsed Mycobacterium avium pneumonia.  She started back on daily azithromycin, ethambutol and rifampin 2 weeks ago.  She is tolerating her antibiotics well.  She has not had any change in her baseline cough or shortness of breath.  She has not been able to bring a sputum sample to [place].  It is been somewhat difficult because she is still working part-time and lives in [place].  She has not had any further episodes since.  She received a letter yesterday stating that she has been approved for aerosolized amikacin and will have no co-pay.  She has not received her nebulizer or any training on how to use it yet.  She has not had audiometry yet although it was ordered at the time of her last visit.  She recently saw her ophthalmologist and was told that she had a right cataract and will probably need surgery at some time in the near future.  Review of  Systems: Review of Systems  Constitutional: Negative for chills, diaphoresis and fever.  Respiratory: Positive for cough, sputum production and shortness of breath. Negative for hemoptysis.   Cardiovascular: Negative for chest pain.  Gastrointestinal: Negative for abdominal pain, diarrhea, nausea and vomiting.    Past Medical History:  Diagnosis Date  . Allergic rhinitis, cause unspecified   . Bronchiectasis without acute exacerbation ([place])   . Cough   . Sinusitis     Social History   Tobacco Use  . Smoking status: Former Smoker    Packs/day: 1.00    Years: 20.00    Pack years: 20.00    Types: Cigarettes    Last attempt to quit: 12/14/1985    Years since quitting: 32.0  . Smokeless tobacco: Never Used  Substance Use Topics  . Alcohol use: Yes    Alcohol/week: 0.6 oz    Types: 1 Glasses of wine per week    Comment: occ  . Drug use: No     Family History  Problem Relation Age of Onset  . Emphysema Sister   . Stroke Mother   . Clotting disorder Mother     No Known Allergies  Objective: Vitals:   12/30/17 1408  BP: 137/80  Pulse: 92  Temp: 98.8 F (37.1 C)  TempSrc: Oral  Weight: 135 lb (61.2 kg)   Body mass index is 23.54 kg/m.  Physical Exam  Constitutional: She is oriented to person, place, and time.  She is in good spirits as usual.  Cardiovascular: Normal rate and regular rhythm.  No murmur heard. Pulmonary/Chest: She has no wheezes. She has no rales.  Neurological: She is alert and oriented to person, place, and time.  Psychiatric: Affect normal.    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  We will also get a repeat chest x-ray to establish her baseline going forward.  We will double check to make sure that she receives the nebulizer and learn how to use it for amikacin and normal saline.  She will follow-up in 1 month.      Relevant Orders   DG Chest 2 View       Kiara Medina, [place] [facility] [phone] pager   [phone] cell 12/30/2017, 2:46 PM

## 2017-12-30 NOTE — Progress Notes (Signed)
HPI: Kiara Medina is a 72 y.o. female who is here to see Dr. Megan Salon for her MAC re-treatment.   Allergies: No Known Allergies  Vitals: Temp: 98.8 F (37.1 C) (01/17 1408) Temp Source: Oral (01/17 1408) BP: 137/80 (01/17 1408) Pulse Rate: 92 (01/17 1408)  Past Medical History: Past Medical History:  Diagnosis Date  . Allergic rhinitis, cause unspecified   . Bronchiectasis without acute exacerbation ([place])   . Cough   . Sinusitis     Social History: Social History   Socioeconomic History  . Marital status: Married    Spouse name: None  . Number of children: None  . Years of education: None  . Highest education level: None  Social Needs  . Financial resource strain: None  . Food insecurity - worry: None  . Food insecurity - inability: None  . Transportation needs - medical: None  . Transportation needs - non-medical: None  Occupational History  . Occupation: realtor  Tobacco Use  . Smoking status: Former Smoker    Packs/day: 1.00    Years: 20.00    Pack years: 20.00    Types: Cigarettes    Last attempt to quit: 12/14/1985    Years since quitting: 32.0  . Smokeless tobacco: Never Used  Substance and Sexual Activity  . Alcohol use: Yes    Alcohol/week: 0.6 oz    Types: 1 Glasses of wine per week    Comment: occ  . Drug use: No  . Sexual activity: None  Other Topics Concern  . None  Social History Narrative  . None    Previous Regimen: Azith/ETB/RIF  Current Regimen: Off  Labs: No results found for: HIV1RNAQUANT, HIV1RNAVL, CD4TABS, HEPBSAB, HEPBSAG, HCVAB  CrCl: CrCl cannot be calculated (Patient's most recent lab result is older than the maximum 21 days allowed.).  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 The plan is to restart her previous regimen with the addition of Arikace. At the end of last year, she decided to wait until this year to start he nebulized amikacin.  [employer] has set up the training for her and meds delivery. Dr. Lorenda Cahill would also for her to be on inhaled hypertonic saline 12 hrs apart from her Arikace. Discuss with [facility] today so an Rx has been sent to them.   Recommendations:  Arikace 510m IH qAM Hypertonic 3% 349mqHS  MiOnnie BoerPharmD, BCPS, AAHIVP, CPP Clinical Infectious Disease [facility] 12/30/2017, 3:33 PM

## 2017-12-30 NOTE — Assessment & Plan Note (Signed)
I will reorder audiometry today.  We will also get a repeat chest x-ray to establish her baseline going forward.  We will double check to make sure that she receives the nebulizer and learn how to use it for amikacin and normal saline.  She will follow-up in 1 month.

## 2018-01-03 ENCOUNTER — Other Ambulatory Visit: Payer: Self-pay | Admitting: *Deleted

## 2018-01-03 DIAGNOSIS — A310 Pulmonary mycobacterial infection: Secondary | ICD-10-CM

## 2018-01-03 MED ORDER — ALBUTEROL SULFATE HFA 108 (90 BASE) MCG/ACT IN AERS: 2.0000 | INHALATION_SPRAY | Freq: Four times a day (QID) | RESPIRATORY_TRACT | 6 refills | Status: DC | PRN

## 2018-01-03 MED ORDER — ALBUTEROL SULFATE HFA 108 (90 BASE) MCG/ACT IN AERS: 2.0000 | INHALATION_SPRAY | Freq: Four times a day (QID) | RESPIRATORY_TRACT | 0 refills | Status: DC | PRN

## 2018-01-03 NOTE — Progress Notes (Signed)
Patient called, stating the pharmacist suggested she have an albuterol inhaler on hand when she starts inhaled amikacin.  OK per Dr Megan Salon, sent 1 inhaler to local [facility] for immediate pickup and long term inhaler prescription to [facility] for future fills. Patient notified.

## 2018-01-04 ENCOUNTER — Other Ambulatory Visit (INDEPENDENT_AMBULATORY_CARE_PROVIDER_SITE_OTHER): Payer: Medicare HMO | Admitting: Internal Medicine

## 2018-01-04 DIAGNOSIS — A310 Pulmonary mycobacterial infection: Secondary | ICD-10-CM

## 2018-01-06 ENCOUNTER — Telehealth: Payer: Self-pay | Admitting: *Deleted

## 2018-01-06 ENCOUNTER — Telehealth: Payer: Self-pay | Admitting: Pharmacist Clinician (PhC)/ Clinical Pharmacy Specialist

## 2018-01-06 NOTE — Telephone Encounter (Signed)
Kiara Medina called [place] to complain about having to do twice daily nebs treatment (Arikace qAM + 3% saline qPM). She is adamant about not wanting/willing to do twice daily treatment because it's disrupting her life. If she has to do it, she will stop it altogether. This was the specific direction from Dr. Lorenda Cahill from [facility]. Told her that I'll relay the message to Dr. Megan Salon for his opinion.

## 2018-01-06 NOTE — Telephone Encounter (Signed)
Patient called with concerns about the nebulizer medications called in to her pharmacy. She advised she was not told about the sodium chloride hyper tonic and why she has to use it. Advised her I am not familiar with the medication but we have the pharmacist here who can help with the questions. Call transferred to [facility] for assistance.

## 2018-01-07 ENCOUNTER — Telehealth: Payer: Self-pay | Admitting: Pharmacist Clinician (PhC)/ Clinical Pharmacy Specialist

## 2018-01-07 NOTE — Telephone Encounter (Signed)
I am okay with her not using the saline nebulizer.

## 2018-01-07 NOTE — Telephone Encounter (Signed)
I spoke to both Kiara Medina and [employer] about continuing her Arikace but ok to stop the 3% saline.

## 2018-01-10 ENCOUNTER — Encounter: Payer: Self-pay | Admitting: Internal Medicine

## 2018-01-10 DIAGNOSIS — R2242 Localized swelling, mass and lump, left lower limb: Secondary | ICD-10-CM | POA: Diagnosis not present

## 2018-01-10 DIAGNOSIS — M79672 Pain in left foot: Secondary | ICD-10-CM | POA: Diagnosis not present

## 2018-01-11 ENCOUNTER — Encounter: Payer: Self-pay | Admitting: Internal Medicine

## 2018-01-11 ENCOUNTER — Telehealth: Payer: Self-pay | Admitting: *Deleted

## 2018-01-11 ENCOUNTER — Ambulatory Visit: Payer: Medicare HMO | Admitting: Internal Medicine

## 2018-01-11 DIAGNOSIS — R490 Dysphonia: Secondary | ICD-10-CM | POA: Diagnosis not present

## 2018-01-11 DIAGNOSIS — A310 Pulmonary mycobacterial infection: Secondary | ICD-10-CM

## 2018-01-11 DIAGNOSIS — R197 Diarrhea, unspecified: Secondary | ICD-10-CM | POA: Diagnosis not present

## 2018-01-11 NOTE — Telephone Encounter (Signed)
Per response to mychart message from Dr Megan Salon called the patient to offer her an appointment to see him today at 4. She was reluctant advised she has a long ride and she is nursing a hemorrhoid but she will come anyway. She advised her nurse told her the loss of voice and throat closing is normal side effect. I advised her the doctor wants to see her and they can discuss that but to hold her dose until she sees him today.

## 2018-01-11 NOTE — Progress Notes (Signed)
[facility]  Patient Active Problem List   Diagnosis Date Noted  . 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 BRONCHIECTASIS 05/23/2009    Priority: Medium  . Dysphonia 01/11/2018  . Diarrhea 01/11/2018  . Right shoulder pain 01/21/2017  . Encounter for hepatitis C screening test for low risk patient 01/21/2017  . GERD (gastroesophageal reflux disease) 02/21/2016  . 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 Dyslipidemia 11/14/2014  . ALLERGIC RHINITIS 05/03/2009    Patient's Medications  New Prescriptions   No medications on file  Previous Medications   ACYCLOVIR (ZOVIRAX) 400 MG TABLET    Take 1 tablet (400 mg total) by mouth 5 (five) times daily.   ALBUTEROL (PROVENTIL HFA;VENTOLIN HFA) 108 (90 BASE) MCG/ACT INHALER    Inhale 2 puffs into the lungs every 6 (six) hours as needed for wheezing or shortness of breath.   ALBUTEROL (PROVENTIL HFA;VENTOLIN HFA) 108 (90 BASE) MCG/ACT INHALER    Inhale 2 puffs into the lungs every 6 (six) hours as needed for wheezing or shortness of breath.   AMIKACIN SULFATE LIPOSOME (ARIKAYCE) 590 MG/8.4ML SUSP    Inhale 590 mg into the lungs daily.   ATORVASTATIN (LIPITOR) 20 MG TABLET    Take 20 mg by mouth daily.   AZITHROMYCIN (ZITHROMAX) 250 MG TABLET    Take 1 tablet (250 mg total) by mouth daily.   B COMPLEX VITAMINS (B COMPLEX-B12 PO)    Take 1 tablet by mouth daily.   BIOTIN 5000 PO    Take 2 capsules by mouth daily.   CALCIUM CARBONATE-VITAMIN D (CALCIUM-VITAMIN D3) 600-200 MG-UNIT TABS    Take 1 tablet by mouth 2 (two) times daily.     CHOLECALCIFEROL (VITAMIN D3) 1000 UNITS CAPS    Take 2,000 Units by mouth daily.   DOCUSATE CALCIUM (STOOL SOFTENER PO)    Take 100 mg by mouth 2 (two) times daily.    ETHAMBUTOL (MYAMBUTOL) 400 MG TABLET    Take 3 tablets (1,200 mg total) by mouth daily.   FLAXSEED, LINSEED, (FLAX SEED OIL) 1300 MG CAPS    Take by mouth daily.   MINOCYCLINE  (DYNACIN) 100 MG TABLET    Take 100 mg by mouth 1 day or 1 dose.   OMEPRAZOLE (PRILOSEC) 40 MG CAPSULE    TAKE 1 CAPSULE TWICE DAILY   PROBIOTIC PRODUCT (PROBIOTIC DAILY PO)    Take 1 capsule by mouth daily.   RIFAMPIN (RIFADIN) 300 MG CAPSULE    Take 2 capsules (600 mg total) by mouth daily.   TURMERIC PO    Take 2,000 mg by mouth daily.    VITAMIN B-12 (CYANOCOBALAMIN) 1000 MCG TABLET    Take 2,000 mcg by mouth daily.  Modified Medications   No medications on file  Discontinued Medications   No medications on file    Subjective: Kiara Medina is seen on a work in basis today.  She has been back on azithromycin, ethambutol and rifampin for the past month for her relapsed Mycobacterium avium pneumonia.  She started aerosolized amikacin 1 week ago.  She has started having loose bowel movements 4-5 times daily.  She is also noted severe dry coughing when undergoing the aerosolized amikacin treatments.  Yesterday she informed us that she was not able to speak.  She states that today she was able to take her amikacin with very minimal coughing and her voice is a little bit stronger.  She  does feel like the medications are helping.  In between aerosol treatments she is not coughing nearly as much as she was before restarting antibiotics.  Since developing diarrhea she is also developed a painful hemorrhoid.  Review of Systems: Review of Systems  Constitutional: Negative for chills, diaphoresis, fever, malaise/fatigue and weight loss.  HENT: Negative for congestion and sore throat.        Hoarseness and loss of voice recently.  Respiratory: Positive for cough. Negative for sputum production and shortness of breath.   Cardiovascular: Negative for chest pain.  Gastrointestinal: Positive for diarrhea. Negative for abdominal pain, heartburn, nausea and vomiting.       Painful hemorrhoid as noted in HPI.  Skin: Negative for rash.  Neurological: Negative for dizziness and headaches.    Past Medical History:    Diagnosis Date  . Allergic rhinitis, cause unspecified   . Bronchiectasis without acute exacerbation ([place])   . Cough   . Sinusitis     Social History   Tobacco Use  . Smoking status: Former Smoker    Packs/day: 1.00    Years: 20.00    Pack years: 20.00    Types: Cigarettes    Last attempt to quit: 12/14/1985    Years since quitting: 32.0  . Smokeless tobacco: Never Used  Substance Use Topics  . Alcohol use: Yes    Alcohol/week: 0.6 oz    Types: 1 Glasses of wine per week    Comment: occ  . Drug use: No    Family History  Problem Relation Age of Onset  . Emphysema Sister   . Stroke Mother   . Clotting disorder Mother     No Known Allergies  Objective: Vitals:   01/11/18 1559  BP: 123/76  Pulse: 98  Temp: 98.1 F (36.7 C)  TempSrc: Oral  Weight: 137 lb (62.1 kg)  Height: 5' 3.5" (1.613 m)   Body mass index is 23.89 kg/m.  Physical Exam  Constitutional: She is oriented to person, place, and time.  She speaks in a whispered voice.  She is still in good spirits.  HENT:  Mouth/Throat: No oropharyngeal exudate.  Cardiovascular: Normal rate and regular rhythm.  No murmur heard. Pulmonary/Chest: Effort normal and breath sounds normal. She has no wheezes. She has no rales.  Neurological: She is alert and oriented to person, place, and time.  Psychiatric: Mood and affect normal.    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  She will continue her treatments for now and follow-up in 2 weeks.        Unprioritized   Diarrhea    I will obtain stool for C. difficile assay.      Relevant Orders   Clostridium Difficile by PCR   Dysphonia    He has developed dysphonia and hoarseness since starting aerosolized amikacin 1 week ago.  This is  probably largely due to the cough induced by the aerosol treatments.  Initial reports indicate that the coughing often improves over time and that seems to be her experience.  Some of the hoarseness may be related to direct irritation of the vocal cords by amikacin.  She feels like she can continue the treatments for now since her voice seems to be coming back.  Michel Bickers, MD [facility] [phone] pager   [phone] cell 01/11/2018, 4:37 PM

## 2018-01-11 NOTE — Assessment & Plan Note (Signed)
I will obtain stool for C. difficile assay.

## 2018-01-11 NOTE — Assessment & Plan Note (Addendum)
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  She will continue her treatments for now and follow-up in 2 weeks.  Her audiometry has still not been done.  We will check on this and make sure that it does get scheduled.

## 2018-01-11 NOTE — Assessment & Plan Note (Signed)
He has developed dysphonia and hoarseness since starting aerosolized amikacin 1 week ago.  This is probably largely due to the cough induced by the aerosol treatments.  Initial reports indicate that the coughing often improves over time and that seems to be her experience.  Some of the hoarseness may be related to direct irritation of the vocal cords by amikacin.  She feels like she can continue the treatments for now since her voice seems to be coming back.

## 2018-01-12 ENCOUNTER — Encounter: Payer: Self-pay | Admitting: Internal Medicine

## 2018-01-19 ENCOUNTER — Ambulatory Visit: Payer: Medicare HMO | Attending: Internal Medicine | Admitting: Audiology

## 2018-01-19 DIAGNOSIS — Z01118 Encounter for examination of ears and hearing with other abnormal findings: Secondary | ICD-10-CM | POA: Insufficient documentation

## 2018-01-19 DIAGNOSIS — Z0110 Encounter for examination of ears and hearing without abnormal findings: Secondary | ICD-10-CM | POA: Diagnosis not present

## 2018-01-19 DIAGNOSIS — R94128 Abnormal results of other function studies of ear and other special senses: Secondary | ICD-10-CM | POA: Diagnosis not present

## 2018-01-19 DIAGNOSIS — Z79899 Other long term (current) drug therapy: Secondary | ICD-10-CM | POA: Insufficient documentation

## 2018-01-19 DIAGNOSIS — Z5181 Encounter for therapeutic drug level monitoring: Secondary | ICD-10-CM | POA: Insufficient documentation

## 2018-01-19 DIAGNOSIS — R94120 Abnormal auditory function study: Secondary | ICD-10-CM | POA: Diagnosis not present

## 2018-01-19 DIAGNOSIS — H9313 Tinnitus, bilateral: Secondary | ICD-10-CM | POA: Insufficient documentation

## 2018-01-19 DIAGNOSIS — H903 Sensorineural hearing loss, bilateral: Secondary | ICD-10-CM | POA: Diagnosis not present

## 2018-01-19 NOTE — Procedures (Signed)
[facility] [address], [place] [address] Main: [phone] Fax: [phone]  AUDIOLOGICAL EVALUATION  NAME: MYSTIQUE BJELLAND  DATE:   01/19/2018 DOB:  07-19-1946  REFERRAL:  Audiological evaluation MRN:  [id number]  REFERENT:  Nicoletta Dress, MD  CASE HISTORY AMRITA RADU, a 72 y.o. female, was seen for an audiological evaluation at the request of Nicoletta Dress, MD.  Today, Ayrabella is here to establish a baseline audiogram for routine ototoxicity monitoring.  She presented with complaints of hearing difficulties and tinnitus.  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  Of the reported ototoxic medications is arikayce, which was recently approved by the Food and Drug Administration.  The possible amount of damage to hearing is currently unknown.  She noted having already experienced several of the associated symptoms, including diarrhea, dysphonia, and fatigue.  Adalay denied any hearing difficulties in her day-to-day life.  She has high-pitched tinnitus in the right and left ears that started approximately five years ago.  It is constant and bothersome to her; however, she has learned to cope with it by using a sound machine and oscillating fan.  Torii denied any changes in hearing or worsening of tinnitus within the past two weeks.  She has a paternal familial history of hearing loss without use of hearing aids.  Lailynn denied all other concerns related to ears and hearing.  No pain was noted.  TEST PROCEDURES Tympanometry, ipsilateral and contralateral acoustic reflexes, distortion product otoacoustic emissions (DPOAEs), conventional pure tone audiometry, and speech audiometry were administered.  TEST RESULTS Tympanometric values for middle ear volume, pressure, and compliance were within normal limits (Type A) in the right and left ears when compared to normative data.  This  is consistent with normal middle ear function, bilaterally.  Ipsilateral acoustic reflex thresholds were present (75-85dB) at 500, 1000, 2000, and 4000 Hz and within normal limits in the right and left ears when compared to normative data.  This is consistent with a normal acoustic reflex pathway up to the level of the auditory brainstem, bilaterally.  DPOAEs were present between 1500-3000 Hz and reduced or absent between 4000-10000 Hz in the right ear.  DPOAEs were reduced or absent between 1500-10000 Hz in the left ear.  Normal DPOAEs are consistent with active outer hair cell (cochlear function), whereas reduced or absent DPOAEs are consistent with abnormal or inactive outer hair cell (cochlear) function.  Conventional pure tone audiometry revealed normal hearing (15-20 dB HL) between [phone] Hz sloping to a mild-to-moderately-severe sensorineural hearing loss between 4000-8000 Hz in the right ear.  Predominantly mild sloping to moderately-severe sensorineural hearing loss (15-35 dBHL from _0  - _1  and 50-70 dBHL from _2  - _3 ) in the left ear.  Ultra-High frequency audiometry revealed hearing thresholds at 90 and 75 dB HL at 10000 and 14000 Hz, respectively, with no responses at 12500 and 16000 Hz in the right ear.  Hearing threshold at 85 dB HL at 10000 Hz with no responses between 12500-16000 Hz in the left ear.  Speech audiometry revealed speech reception thresholds using recorded spondee word lists at 20 dB and 30 dB in the right and left ears, respectively.  There is good agreement with the pure tone averages.  Suprathreshold word recognition scores in quiet using recorded NU-6 word lists were 100% at 60 dB HL and 100% at 70 dB HL in the right and left ears, respectively.  The scores are within the expected ranges when compared to the pure tone averages.  Speech-in-noise scores were 60% and 76% at +5 dB SNR in the right and left ears, respectively.  Tinnitus was matched to 8000 Hz,  bilaterally, with an intensity of 76 dB HL in the right ear.  Tinnitus was reportedly suppressed after one minute of continuous speech noise. The overall test reliability was judged to be good.  SUMMARY AND RECOMMENDATIONS Summary:  ROSELLE NORTON has a moderately-severe sensorineural hearing loss. She has relatively loud, constant, bothersome tinnitus bilaterally.  Word recognition is excellent in quiet bilaterally but drops to poor on the right and fair on the left background noise. Zayli is a highly motivated candidate for amplification based on the degree of hearing loss and severity of tinnitus in the right and left ears.  She was provided with a list of nearby places that offer hearing aid services.  Julita was counseled on hearing loss, tinnitus, ototoxicity monitoring, and amplification.  Recommendations: 1.  To closely monitor hearing and rule out otoxocity,  Florabel will return for a routine audiological evaluation on February 01, 2018 at 4:00 PM.  At that time, she will have been taking ototoxic medications for one month.  Tenley will return sooner if she experiences a noticeable change in hearing or worsening of tinnitus. Based on the results of this audiological evaluation, addition follow-up of  biweekly or monthly follow-up visits may be recommended due to reportedly high dosage levels, combination of ototoxic medications, and unpredictable damage to hearing.  2.  Contact physician for any concerns about hearing or other side effects.  3.  If Truitt Merle experiences any changes or concerns about hearing or tinnitus she is to call or email the audiologists at [facility] to arrange an immediate hearing evaluation, in addition to contacting her physician.  Vala will contact us with any questions or concerns.  Jason Nest, [place] Graduate Student Clinician  Kae Heller, AuD, CCC-A Doctor of Audiology 01/19/2018

## 2018-01-19 NOTE — Patient Instructions (Signed)
Since this is a new medication, close monitoring in 2 weeks is recommended since Kiara Medina states that she has been experiencing a few medication side effects.  If hearing is stable in two weeks, frequency of audiological monitoring will be adjusted.   1.     To minimize the adverse effects of tinnitus 1) avoid quiet  2) use noise maskers at home such as a sound machine, quiet music, a fan or other background noise at a volume just loud enough to mask the high pitched tinnitus. 3) If the tinnitus becomes more bothersome, adversely affecting your sleep or concentration, contact your physician,  seek additional medical help by an ENT for further treatment of your tinnitus.      cc: Nicoletta Dress, MD

## 2018-01-26 ENCOUNTER — Other Ambulatory Visit: Payer: Self-pay | Admitting: Pulmonary Disease

## 2018-01-27 ENCOUNTER — Ambulatory Visit: Payer: Medicare HMO | Admitting: Internal Medicine

## 2018-01-27 ENCOUNTER — Encounter: Payer: Self-pay | Admitting: Internal Medicine

## 2018-01-27 DIAGNOSIS — A310 Pulmonary mycobacterial infection: Secondary | ICD-10-CM

## 2018-01-27 NOTE — Progress Notes (Signed)
[facility] Infectious Disease  Patient Active Problem List   Diagnosis Date Noted  . 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 BRONCHIECTASIS 05/23/2009    Priority: Medium  . Dysphonia 01/11/2018  . Diarrhea 01/11/2018  . Right shoulder pain 01/21/2017  . Encounter for hepatitis C screening test for low risk patient 01/21/2017  . GERD (gastroesophageal reflux disease) 02/21/2016  . 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 Dyslipidemia 11/14/2014  . ALLERGIC RHINITIS 05/03/2009    Patient's Medications  New Prescriptions   No medications on file  Previous Medications   ACYCLOVIR (ZOVIRAX) 400 MG TABLET    Take 1 tablet (400 mg total) by mouth 5 (five) times daily.   ALBUTEROL (PROVENTIL HFA;VENTOLIN HFA) 108 (90 BASE) MCG/ACT INHALER    Inhale 2 puffs into the lungs every 6 (six) hours as needed for wheezing or shortness of breath.   ALBUTEROL (PROVENTIL HFA;VENTOLIN HFA) 108 (90 BASE) MCG/ACT INHALER    Inhale 2 puffs into the lungs every 6 (six) hours as needed for wheezing or shortness of breath.   AMIKACIN SULFATE LIPOSOME (ARIKAYCE) 590 MG/8.4ML SUSP    Inhale 590 mg into the lungs daily.   ATORVASTATIN (LIPITOR) 20 MG TABLET    Take 20 mg by mouth daily.   AZITHROMYCIN (ZITHROMAX) 250 MG TABLET    Take 1 tablet (250 mg total) by mouth daily.   B COMPLEX VITAMINS (B COMPLEX-B12 PO)    Take 1 tablet by mouth daily.   BIOTIN 5000 PO    Take 2 capsules by mouth daily.   CALCIUM CARBONATE-VITAMIN D (CALCIUM-VITAMIN D3) 600-200 MG-UNIT TABS    Take 1 tablet by mouth 2 (two) times daily.     CHOLECALCIFEROL (VITAMIN D3) 1000 UNITS CAPS    Take 2,000 Units by mouth daily.   DOCUSATE CALCIUM (STOOL SOFTENER PO)    Take 100 mg by mouth 2 (two) times daily.    ETHAMBUTOL (MYAMBUTOL) 400 MG TABLET    Take 3 tablets (1,200 mg total) by mouth daily.   FLAXSEED, LINSEED, (FLAX SEED OIL) 1300 MG CAPS    Take by mouth daily.   MINOCYCLINE  (DYNACIN) 100 MG TABLET    Take 100 mg by mouth 1 day or 1 dose.   OMEPRAZOLE (PRILOSEC) 40 MG CAPSULE    TAKE 1 CAPSULE TWICE DAILY   PROBIOTIC PRODUCT (PROBIOTIC DAILY PO)    Take 1 capsule by mouth daily.   RIFAMPIN (RIFADIN) 300 MG CAPSULE    Take 2 capsules (600 mg total) by mouth daily.   TURMERIC PO    Take 2,000 mg by mouth daily.    VITAMIN B-12 (CYANOCOBALAMIN) 1000 MCG TABLET    Take 2,000 mcg by mouth daily.  Modified Medications   No medications on file  Discontinued Medications   No medications on file    Subjective: Valrie is in for her routine follow-up visit.  She restarted azithromycin, ethambutol and rifampin on January Mycobacterium avium pneumonia.  She started aerosolized amikacin 4 weeks ago.  She is tolerating the amikacin much better now.  She still has some episodes of coughing during the treatments but this is much less than it was when she first started.  Her laryngitis has resolved.  She finds that she is able to sputum following the aerosol treatments.  She does feel less short of breath recently.  However she is a little more fatigued than normal and has had  weight.  She is no longer having any diarrhea.  She has 2 or 3 soft bowel movements each morning.  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  She does get exercise at least 4 times a week.  She tries to walk on the indoor track at the senior center and [place] for at least 30 minutes and she also rides a stationary bike.  She has had no changes in her hearing or vision.  Review of Systems: Review of Systems  Constitutional: Positive for malaise/fatigue. Negative for chills, diaphoresis, fever and weight loss.  HENT: Positive for hearing loss and tinnitus. Negative for congestion and sore throat.        She has some hearing loss especially in noisy restaurants and some baseline tinnitus.  Respiratory: Positive for cough, sputum production and shortness of breath. Negative for hemoptysis and wheezing.     Cardiovascular: Negative for chest pain.  Gastrointestinal: Negative for abdominal pain, diarrhea, nausea and vomiting.  Skin: Negative for rash.    Past Medical History:  Diagnosis Date  . Allergic rhinitis, cause unspecified   . Bronchiectasis without acute exacerbation ([place])   . Cough   . Sinusitis     Social History   Tobacco Use  . Smoking status: Former Smoker    Packs/day: 1.00    Years: 20.00    Pack years: 20.00    Types: Cigarettes    Last attempt to quit: 12/14/1985    Years since quitting: 32.1  . Smokeless tobacco: Never Used  Substance Use Topics  . Alcohol use: Yes    Alcohol/week: 0.6 oz    Types: 1 Glasses of wine per week    Comment: occ  . Drug use: No    Family History  Problem Relation Age of Onset  . Emphysema Sister   . Stroke Mother   . Clotting disorder Mother     No Known Allergies  Objective: Vitals:   01/27/18 0934  BP: 132/80  Pulse: 84  Temp: 97.8 F (36.6 C)  TempSrc: Oral  Weight: 138 lb (62.6 kg)   Body mass index is 24.06 kg/m.  Physical Exam  Constitutional: She is oriented to person, place, and time.  Her weight is up 2 pounds.  Cardiovascular: Normal rate and regular rhythm.  No murmur heard. Pulmonary/Chest: Effort normal. She has no wheezes. She has no rales.  Abdominal: Soft. She exhibits no distension. There is no tenderness.  Neurological: She is alert and oriented to person, place, and time.  Skin: No rash noted.  Psychiatric: Mood and affect normal.    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  She wants to continue on her current regimen.  We will check lab work today and a repeat sputum AFB smear and culture.  She will follow-up in 4 weeks.  I have addended below Dr. Corene Cornea Stout's recommendations for her.  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 I discussed the nature of nontuberculous mycobacterial pulmonary disease, including the relatively low cure rate with antibiotics, the risk of relapse, and the indolent nature of the disease. I encouraged her to increase her level of aerobic exercise and to use appropriate hand hygiene to minimize the risk of respiratory tract infections. Also encouraged yearly influenza vaccinations. I personally reviewed her CT scan  from 2015; she has diffuse disease and is therefore not a candidate for surgical resection. I would recommend proceeding as follows:  A) Resume azithromycin 250 mg, rifampin 600 mg, and ethambutol 1200 mg daily. Even though she has macrolide resistance, the azithromycin has antiinflammatory properties that have been demonstrated to be helpful in patients with bronchiectasis, and may help her symptoms. B) Add Arikayce (inhaled liposomal amikacin) once daily to her regimen.  C) In addition to the inhaled liposomal amikacin, I would also give inhaled hypertonic saline (3%) 3 cc once daily, about 12 hours apart from the inhaled amikacin, to optimize airway clearance. D) Regular monitoring for ocular, renal, and ototoxicity while on this regimen, particularly given baseline tinnitus. I would do a baseline and monthly audiograms in addition to regular ophthalmology visits. E) I would obtain sputum specimens for AFB monthly to assess microbiologic response, but the overall focus should be on clinical improvement. F) Given limited data for efficacy, I would not pursue clofazimine or bedaquiline at this point. However, if she does not respond within 3-4 months of the multi-drug therapy advocated above, adding clofazimine could be a reasonable option.          Relevant Orders   CBC   Comprehensive metabolic panel   MYCOBACTERIA, CULTURE, WITH FLUOROCHROME SMEAR       Michel Bickers, MD [facility] for [place] [phone] pager   [phone] cell 01/27/2018, 9:54 AM

## 2018-01-27 NOTE — Assessment & Plan Note (Signed)
She is tolerating her medications reasonably well feels like her shortness of breath has improved.  She wants to continue on her current regimen.  We will check lab work today and a repeat sputum AFB smear and culture.  She will follow-up in 4 weeks.  I have addended below Dr. Corene Cornea Stout's recommendations for her.  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 I discussed the nature of nontuberculous mycobacterial pulmonary disease, including the relatively low cure rate with antibiotics, the risk of relapse, and the indolent nature of the disease. I encouraged her to increase her level of aerobic exercise and to use appropriate hand hygiene to minimize the risk of respiratory tract infections. Also encouraged yearly influenza vaccinations. I personally reviewed her CT scan from 2015; she has diffuse disease and is therefore not a candidate for surgical resection. I would recommend proceeding as follows:  A) Resume azithromycin 250 mg, rifampin 600 mg, and ethambutol 1200 mg daily. Even though she has macrolide resistance, the azithromycin has antiinflammatory properties that have been demonstrated to be helpful in patients with bronchiectasis, and may help her symptoms. B) Add Arikayce (inhaled liposomal amikacin) once daily to her regimen.  C) In addition to the inhaled liposomal amikacin, I would also give inhaled hypertonic saline (3%) 3 cc once daily, about 12 hours apart from the inhaled amikacin, to optimize airway clearance. D) Regular monitoring for ocular, renal, and ototoxicity while on this regimen, particularly given baseline tinnitus. I would do a baseline and monthly audiograms in addition to regular ophthalmology visits. E) I would obtain sputum specimens for AFB monthly to assess microbiologic response, but the overall focus should be on clinical improvement. F) Given limited data for efficacy, I would not pursue clofazimine or bedaquiline at this point. However, if she  does not respond within 3-4 months of the multi-drug therapy advocated above, adding clofazimine could be a reasonable option.

## 2018-01-28 LAB — CBC
HCT: 45.8 % — ABNORMAL HIGH (ref 35.0–45.0)
HEMOGLOBIN: 15.4 g/dL (ref 11.7–15.5)
MCH: 31.1 pg (ref 27.0–33.0)
MCHC: 33.6 g/dL (ref 32.0–36.0)
MCV: 92.5 fL (ref 80.0–100.0)
MPV: 11.4 fL (ref 7.5–12.5)
Platelets: 162 10*3/uL (ref 140–400)
RBC: 4.95 10*6/uL (ref 3.80–5.10)
RDW: 13.1 % (ref 11.0–15.0)
WBC: 5.5 10*3/uL (ref 3.8–10.8)

## 2018-01-28 LAB — COMPREHENSIVE METABOLIC PANEL
AG RATIO: 1.4 (calc) (ref 1.0–2.5)
ALKALINE PHOSPHATASE (APISO): 78 U/L (ref 33–130)
ALT: 17 U/L (ref 6–29)
AST: 26 U/L (ref 10–35)
Albumin: 3.9 g/dL (ref 3.6–5.1)
BILIRUBIN TOTAL: 0.8 mg/dL (ref 0.2–1.2)
BUN: 17 mg/dL (ref 7–25)
CALCIUM: 9.5 mg/dL (ref 8.6–10.4)
CHLORIDE: 105 mmol/L (ref 98–110)
CO2: 23 mmol/L (ref 20–32)
Creat: 0.69 mg/dL (ref 0.60–0.93)
GLOBULIN: 2.7 g/dL (ref 1.9–3.7)
GLUCOSE: 47 mg/dL — AB (ref 65–99)
Potassium: 4 mmol/L (ref 3.5–5.3)
Sodium: 143 mmol/L (ref 135–146)
Total Protein: 6.6 g/dL (ref 6.1–8.1)

## 2018-01-28 LAB — SPECIMEN COMPROMISED

## 2018-01-31 ENCOUNTER — Telehealth: Payer: Self-pay | Admitting: *Deleted

## 2018-01-31 DIAGNOSIS — M654 Radial styloid tenosynovitis [de Quervain]: Secondary | ICD-10-CM | POA: Diagnosis not present

## 2018-01-31 DIAGNOSIS — M65311 Trigger thumb, right thumb: Secondary | ICD-10-CM | POA: Diagnosis not present

## 2018-01-31 DIAGNOSIS — M13841 Other specified arthritis, right hand: Secondary | ICD-10-CM | POA: Diagnosis not present

## 2018-01-31 NOTE — Telephone Encounter (Signed)
[facility] called to advise that the patient preliminary micobactrium labs are resulted and wants to make sure the provider looks at them. Advised will fax a note to him.

## 2018-02-01 ENCOUNTER — Ambulatory Visit: Payer: Medicare HMO | Admitting: Audiology

## 2018-02-01 NOTE — Telephone Encounter (Signed)
Please let them know that I actually look at my in box.

## 2018-02-07 ENCOUNTER — Ambulatory Visit: Payer: Medicare HMO | Admitting: Audiology

## 2018-02-07 DIAGNOSIS — Z79899 Other long term (current) drug therapy: Principal | ICD-10-CM

## 2018-02-07 DIAGNOSIS — Z0110 Encounter for examination of ears and hearing without abnormal findings: Secondary | ICD-10-CM

## 2018-02-07 DIAGNOSIS — R2242 Localized swelling, mass and lump, left lower limb: Secondary | ICD-10-CM | POA: Diagnosis not present

## 2018-02-07 DIAGNOSIS — Z01118 Encounter for examination of ears and hearing with other abnormal findings: Secondary | ICD-10-CM

## 2018-02-07 DIAGNOSIS — Z5181 Encounter for therapeutic drug level monitoring: Secondary | ICD-10-CM | POA: Diagnosis not present

## 2018-02-07 DIAGNOSIS — R94120 Abnormal auditory function study: Secondary | ICD-10-CM | POA: Diagnosis not present

## 2018-02-07 DIAGNOSIS — R94128 Abnormal results of other function studies of ear and other special senses: Secondary | ICD-10-CM

## 2018-02-07 DIAGNOSIS — H9313 Tinnitus, bilateral: Secondary | ICD-10-CM

## 2018-02-07 DIAGNOSIS — H903 Sensorineural hearing loss, bilateral: Secondary | ICD-10-CM | POA: Diagnosis not present

## 2018-02-07 NOTE — Procedures (Signed)
[facility] [address], [place] [address] Main: [phone] Fax: [phone]  AUDIOLOGICAL EVALUATION  NAME:            Kiara Medina             DATE:                         02/07/2018 DOB:               1946-08-25                      REFERRAL:               Audiological evaluation MRN:               [id number]                  REFERENT:               Kiara Bickers MD  CASE HISTORY Kiara Medina, a 72 y.o. female, was seen for a repeat audiological evaluation to monitor hearing while on the potentially ototoxic medication "Arikayce".  Kiara Medina has not noticed any change in tinnitus or hearing since the baseline hearing evaluation on 01/19/2018.  Previous significant history: Kiara Medina is now taking arikayce to treat mycobacterium avium complex (MAC). At the last visit Kiara Medina noted having already experienced several of the associated symptoms, including diarrhea, dysphonia, and fatigue. She has experienced a  high-pitched tinnitus in the right and left ears that started approximately five years ago.  No pain was noted.                TEST RESULTS Conventional pure tone audiometry shows stable hearing thresholds of 10-20 dBHL (previously 15-20 dB HL) between [phone] Hz sloping to a mild-to-moderately-severe sensorineural hearing loss of 25dBHL -65dBHL  between 4000-8000 Hz in the right ear.  The left ear shows mild sloping to moderately-severe sensorineural hearing loss 20-35 dBHL from 250Hz  - 3000Hz  (stable compared to previous results) and 50-65 dBHL (previously 50-70 dBHL) from 4000Hz  - 8000Hz ) in the left ear.   Word recognition scores in quiet using recorded NU-6 word lists were 100% at 60 dB HL and 100% at 70 dB HL in the right and left ears, respectively with contralateral masking.   Speech-in-noise scores were 72% (previously 60%) on the right and 72% (previously 76%) on the left  at +5 dB SNRy.  Tinnitus was matched to  8000 Hz, bilaterally, with an intensity of 68dBHL when presented binaurally (previously 76 dB HL in the right ear).  The overall test reliability was judged to be good.  SUMMARY AND RECOMMENDATIONS Summary:  Kiara Medina has stable hearing thresholds compared to last month with a moderately-severe sensorineural hearing loss. She continues to have relatively loud, constant, bothersome tinnitus bilaterally.  Word recognition is also stable and runs from excellent in quiet bilaterally but fair in each ear background noise.   Kiara Medina was counseled on hearing loss, tinnitus, ototoxicity monitoring, and amplification. Repeat testing in one month was scheduled here.  Recommendations: 1.  To closely monitor hearing and rule out otoxocity,  Kiara Medina will return for a routine audiological evaluation on March 10, 2018 at 3:30 PM.  At that time, she will have been taking ototoxic medications for one month.  Kiara Medina will return sooner if she experiences a noticeable change in hearing or worsening  of tinnitus. Based on the results of this audiological evaluation, addition follow-up of  biweekly or monthly follow-up visits may be recommended due to reportedly high dosage levels, combination of ototoxic medications, and unpredictable damage to hearing.  2.  Contact physician for any concerns about hearing or other side effects.  3.  If Kiara Medina experiences any changes or concerns about hearing or tinnitus she is to call or email the audiologists at [facility] to arrange an immediate hearing evaluation, in addition to contacting her physician.  Kiara Medina will contact us with any questions or concerns.   Kae Heller, AuD, CCC-A Doctor of Audiology

## 2018-02-17 ENCOUNTER — Telehealth: Payer: Self-pay | Admitting: *Deleted

## 2018-02-17 NOTE — Telephone Encounter (Signed)
Incoming call from [facility] regarding [place] results from 2/14. Patient is currently being treated for MAC. RN asked if this lab was being sent for sensitivities. Landis Gandy, RN

## 2018-02-24 ENCOUNTER — Encounter: Payer: Self-pay | Admitting: Internal Medicine

## 2018-02-24 ENCOUNTER — Ambulatory Visit: Payer: Medicare HMO | Admitting: Internal Medicine

## 2018-02-24 DIAGNOSIS — R197 Diarrhea, unspecified: Secondary | ICD-10-CM | POA: Diagnosis not present

## 2018-02-24 DIAGNOSIS — R490 Dysphonia: Secondary | ICD-10-CM | POA: Diagnosis not present

## 2018-02-24 DIAGNOSIS — A310 Pulmonary mycobacterial infection: Secondary | ICD-10-CM

## 2018-02-24 NOTE — Assessment & Plan Note (Signed)
She is not really having frank diarrhea but is very much bothered by frequent soft stools.  She currently has a hemorrhoid and a lot of rectal irritation.  It is not clear if this is due to her oral antibiotics, she was wondering if it was due to the aerosolized amikacin but I doubt this.  I suggested that she try titrating up the dose of her antidiarrheal supplement to see if she can decrease the frequency of bowel movements without becoming constipated.

## 2018-02-24 NOTE — Progress Notes (Signed)
[facility] for Infectious Disease  Patient Active Problem List   Diagnosis Date Noted  . 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 BRONCHIECTASIS 05/23/2009    Priority: Medium  . Dysphonia 01/11/2018  . Diarrhea 01/11/2018  . Right shoulder pain 01/21/2017  . Encounter for hepatitis C screening test for low risk patient 01/21/2017  . GERD (gastroesophageal reflux disease) 02/21/2016  . 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 Dyslipidemia 11/14/2014  . ALLERGIC RHINITIS 05/03/2009    Patient's Medications  New Prescriptions   No medications on file  Previous Medications   ACYCLOVIR (ZOVIRAX) 400 MG TABLET    Take 1 tablet (400 mg total) by mouth 5 (five) times daily.   ALBUTEROL (PROVENTIL HFA;VENTOLIN HFA) 108 (90 BASE) MCG/ACT INHALER    Inhale 2 puffs into the lungs every 6 (six) hours as needed for wheezing or shortness of breath.   ALBUTEROL (PROVENTIL HFA;VENTOLIN HFA) 108 (90 BASE) MCG/ACT INHALER    Inhale 2 puffs into the lungs every 6 (six) hours as needed for wheezing or shortness of breath.   AMIKACIN SULFATE LIPOSOME (ARIKAYCE) 590 MG/8.4ML SUSP    Inhale 590 mg into the lungs daily.   ATORVASTATIN (LIPITOR) 20 MG TABLET    Take 20 mg by mouth daily.   AZITHROMYCIN (ZITHROMAX) 250 MG TABLET    Take 1 tablet (250 mg total) by mouth daily.   B COMPLEX VITAMINS (B COMPLEX-B12 PO)    Take 1 tablet by mouth daily.   BIOTIN 5000 PO    Take 2 capsules by mouth daily.   CALCIUM CARBONATE-VITAMIN D (CALCIUM-VITAMIN D3) 600-200 MG-UNIT TABS    Take 1 tablet by mouth 2 (two) times daily.     CHOLECALCIFEROL (VITAMIN D3) 1000 UNITS CAPS    Take 2,000 Units by mouth daily.   DOCUSATE CALCIUM (STOOL SOFTENER PO)    Take 100 mg by mouth 2 (two) times daily.    ETHAMBUTOL (MYAMBUTOL) 400 MG TABLET    Take 3 tablets (1,200 mg total) by mouth daily.   FLAXSEED, LINSEED, (FLAX SEED OIL) 1300 MG CAPS    Take by mouth daily.   MINOCYCLINE  (DYNACIN) 100 MG TABLET    Take 100 mg by mouth 1 day or 1 dose.   OMEPRAZOLE (PRILOSEC) 40 MG CAPSULE    TAKE 1 CAPSULE TWICE DAILY   PROBIOTIC PRODUCT (PROBIOTIC DAILY PO)    Take 1 capsule by mouth daily.   RIFAMPIN (RIFADIN) 300 MG CAPSULE    Take 2 capsules (600 mg total) by mouth daily.   TURMERIC PO    Take 2,000 mg by mouth daily.    VITAMIN B-12 (CYANOCOBALAMIN) 1000 MCG TABLET    Take 2,000 mcg by mouth daily.  Modified Medications   No medications on file  Discontinued Medications   No medications on file    Subjective: Kiara Medina is in for her routine follow-up visit.  She has relapsed, resistant Mycobacterium avium pneumonia.  She has been back on daily azithromycin, ethambutol and rifampin since early January.  She started aerosolized amikacin in mid January.  She thinks that her dyspnea on exertion may be slightly better.  She has not noted any change in the sputum that she produces each day.  She feels like her cough is much worse and different than before.  She believes the cough has definitely worsened since she started the aerosolized amikacin.  She is having frequent soft bowel movements,  4-5 times daily.  She has tried taking an over-the-counter diarrhea pill.  It does help but she is only taking it 1 or 2 times each week.  Sometimes she will cough so hard that she is incontinent of small amounts of stool.  She feels like her quality of life has gotten worse since starting back on antibiotics.  Review of Systems: Review of Systems  Constitutional: Positive for malaise/fatigue. Negative for chills, diaphoresis, fever and weight loss.  HENT: Positive for tinnitus. Negative for congestion, hearing loss and sore throat.        She has had no change in her hearing or chronic tinnitus.  Eyes:       No change in vision.  She has a cataract but is uncertain when she would be willing to have surgery.  She sees her ophthalmologist in May.  Respiratory: Positive for cough, sputum production  and shortness of breath. Negative for hemoptysis and wheezing.   Cardiovascular: Negative for chest pain.  Gastrointestinal: Negative for abdominal pain, diarrhea, heartburn, nausea and vomiting.       As noted in HPI.  Skin: Negative for rash.  Neurological: Negative for dizziness and headaches.    Past Medical History:  Diagnosis Date  . Allergic rhinitis, cause unspecified   . Bronchiectasis without acute exacerbation ([place])   . Cough   . Sinusitis     Social History   Tobacco Use  . Smoking status: Former Smoker    Packs/day: 1.00    Years: 20.00    Pack years: 20.00    Types: Cigarettes    Last attempt to quit: 12/14/1985    Years since quitting: 32.2  . Smokeless tobacco: Never Used  Substance Use Topics  . Alcohol use: Yes    Alcohol/week: 0.6 oz    Types: 1 Glasses of wine per week    Comment: occ  . Drug use: No    Family History  Problem Relation Age of Onset  . Emphysema Sister   . Stroke Mother   . Clotting disorder Mother     No Known Allergies  Objective: Vitals:   02/24/18 1411  BP: 118/73  Pulse: (!) 114  Temp: (!) 97.4 F (36.3 C)  TempSrc: Oral  SpO2: 93%  Weight: 136 lb (61.7 kg)  Height: 5' 3.5" (1.613 m)   Body mass index is 23.71 kg/m.  Physical Exam  Constitutional: She is oriented to person, place, and time.  She is appropriately frustrated but otherwise in good spirits.  Her weight is unchanged.  Cardiovascular: Normal rate and regular rhythm.  No murmur heard. Pulmonary/Chest: Effort normal. She has no wheezes. She has rales.  Abdominal: Soft. She exhibits no distension. There is no tenderness.  Neurological: She is alert and oriented to person, place, and time.  Psychiatric: Affect normal.    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  Her  repeat sputum culture was still positive on 01/27/2018, 6 weeks after restarting therapy.  She will have repeat sputum culture and blood work again today.  So far she feels like she can tolerate continuing her oral antibiotics and aerosolized amikacin.  However her quality of life is suffering.  She tells me that she does not feel like she can travel because of all of the equipment and complex protocol  for administering the amikacin.  I told her that she will have to decide if it feels like it is worth continuing the amikacin.  I told her that she certainly could choose to stop taking it for a week or so if she were to go on vacation.  She will follow-up with me in 4 weeks.      Relevant Orders   CBC   Comprehensive metabolic panel   MYCOBACTERIA, CULTURE, WITH FLUOROCHROME SMEAR     Unprioritized   Diarrhea    She is not really having frank diarrhea but is very much bothered by frequent soft stools.  She currently has a hemorrhoid and a lot of rectal irritation.  It is not clear if this is due to her oral antibiotics, she was wondering if it was due to the aerosolized amikacin but I doubt this.  I suggested that she try titrating up the dose of her antidiarrheal supplement to see if she can decrease the frequency of bowel movements without becoming constipated.      Dysphonia    Her cough has definitely worsened as a result of the aerosolized amikacin but her dysphonia and hoarseness has improved.          Michel Bickers, MD [facility] [phone] pager   [phone] cell 02/24/2018, 2:54 PM

## 2018-02-24 NOTE — Assessment & Plan Note (Signed)
Her cough has definitely worsened as a result of the aerosolized amikacin but her dysphonia and hoarseness has improved.

## 2018-02-24 NOTE — Assessment & Plan Note (Signed)
She has had very little improvement since restarting therapy for relapsed Mycobacterium avium pneumonia.  Her repeat sputum culture was still positive on 01/27/2018, 6 weeks after restarting therapy.  She will have repeat sputum culture and blood work again today.  So far she feels like she can tolerate continuing her oral antibiotics and aerosolized amikacin.  However her quality of life is suffering.  She tells me that she does not feel like she can travel because of all of the equipment and complex protocol for administering the amikacin.  I told her that she will have to decide if it feels like it is worth continuing the amikacin.  I told her that she certainly could choose to stop taking it for a week or so if she were to go on vacation.  She will follow-up with me in 4 weeks.

## 2018-02-25 LAB — CBC
HEMATOCRIT: 45.9 % — AB (ref 35.0–45.0)
HEMOGLOBIN: 15.8 g/dL — AB (ref 11.7–15.5)
MCH: 31.5 pg (ref 27.0–33.0)
MCHC: 34.4 g/dL (ref 32.0–36.0)
MCV: 91.4 fL (ref 80.0–100.0)
MPV: 11 fL (ref 7.5–12.5)
Platelets: 168 10*3/uL (ref 140–400)
RBC: 5.02 10*6/uL (ref 3.80–5.10)
RDW: 13.4 % (ref 11.0–15.0)
WBC: 5.8 10*3/uL (ref 3.8–10.8)

## 2018-02-25 LAB — COMPREHENSIVE METABOLIC PANEL
AG RATIO: 1.7 (calc) (ref 1.0–2.5)
ALT: 16 U/L (ref 6–29)
AST: 25 U/L (ref 10–35)
Albumin: 4.1 g/dL (ref 3.6–5.1)
Alkaline phosphatase (APISO): 73 U/L (ref 33–130)
BILIRUBIN TOTAL: 0.7 mg/dL (ref 0.2–1.2)
BUN: 16 mg/dL (ref 7–25)
CALCIUM: 9.5 mg/dL (ref 8.6–10.4)
CO2: 29 mmol/L (ref 20–32)
Chloride: 106 mmol/L (ref 98–110)
Creat: 0.78 mg/dL (ref 0.60–0.93)
Globulin: 2.4 g/dL (calc) (ref 1.9–3.7)
Glucose, Bld: 120 mg/dL — ABNORMAL HIGH (ref 65–99)
POTASSIUM: 4 mmol/L (ref 3.5–5.3)
SODIUM: 142 mmol/L (ref 135–146)
TOTAL PROTEIN: 6.5 g/dL (ref 6.1–8.1)

## 2018-03-09 ENCOUNTER — Telehealth: Payer: Self-pay | Admitting: *Deleted

## 2018-03-09 NOTE — Telephone Encounter (Signed)
[facility] calling to report patient's sputum sample was positive for acid-fast bacilli. Aide is in treatment at [facility] for MAC. Landis Gandy, RN

## 2018-03-10 ENCOUNTER — Ambulatory Visit: Payer: Medicare HMO | Admitting: Audiology

## 2018-03-10 ENCOUNTER — Telehealth: Payer: Self-pay | Admitting: Internal Medicine

## 2018-03-10 NOTE — Telephone Encounter (Signed)
Thanks for that advice and taking care of [place].  Aamilah Augenstein ===View-only below this line===  ----- Message ----- From: Fredonia Highland, AUD Sent: 03/10/2018   4:12 PM To: Michel Bickers, MD Subject: Ototoxic monitoring/Aud referral               Dear Dr. Megan Salon, Kiara Medina came today for a one month audiological follow-up evaluation. She is concerned that although her co-pay is supposed to be $35 but with the hospital facility fee and all it cost her $85 to come here.   She was very nice with stable hearing by report. [facility] on [address] will only charge her the $35 co-pay and they are great audiologists. She has an appointment there for next Wednesday.  I hope this is ok with you.   If so, please send a referral there for an audiological evaluation  FAX [phone]  [facility].  Dr. Valda Favia Tel  [phone]

## 2018-03-16 DIAGNOSIS — H903 Sensorineural hearing loss, bilateral: Secondary | ICD-10-CM | POA: Diagnosis not present

## 2018-03-16 DIAGNOSIS — H9313 Tinnitus, bilateral: Secondary | ICD-10-CM | POA: Diagnosis not present

## 2018-03-16 DIAGNOSIS — Z822 Family history of deafness and hearing loss: Secondary | ICD-10-CM | POA: Diagnosis not present

## 2018-03-24 ENCOUNTER — Ambulatory Visit (INDEPENDENT_AMBULATORY_CARE_PROVIDER_SITE_OTHER): Payer: Medicare HMO | Admitting: Pulmonary Disease

## 2018-03-24 ENCOUNTER — Ambulatory Visit (HOSPITAL_BASED_OUTPATIENT_CLINIC_OR_DEPARTMENT_OTHER)
Admission: RE | Admit: 2018-03-24 | Discharge: 2018-03-24 | Disposition: A | Discharge status: Home / Self Care | Payer: Medicare HMO | Source: Ambulatory Visit | Attending: Pulmonary Disease | Admitting: Pulmonary Disease

## 2018-03-24 ENCOUNTER — Encounter: Payer: Self-pay | Admitting: Pulmonary Disease

## 2018-03-24 VITALS — BP 118/71 | HR 86 | Ht 63.0 in | Wt 131.0 lb

## 2018-03-24 DIAGNOSIS — R05 Cough: Secondary | ICD-10-CM | POA: Diagnosis not present

## 2018-03-24 DIAGNOSIS — R0602 Shortness of breath: Secondary | ICD-10-CM | POA: Insufficient documentation

## 2018-03-24 DIAGNOSIS — J479 Bronchiectasis, uncomplicated: Secondary | ICD-10-CM

## 2018-03-24 DIAGNOSIS — A310 Pulmonary mycobacterial infection: Secondary | ICD-10-CM

## 2018-03-24 MED ORDER — AEROCHAMBER MV MISC: 2 refills | Status: DC

## 2018-03-24 NOTE — Patient Instructions (Signed)
Increased shortness of breath and cough: I am going to get a lung function test and chest x-ray to look for COPD and inflammation caused by the inhaled amikacin Use albuterol 2 puffs prior to using the inhaled amikacin Use a spacer with the albuterol  MAI: Continue treatment as directed by the [facility]  Follow-up with me or 1 of the nurse practitioners in 2 weeks to go over this result

## 2018-03-24 NOTE — Progress Notes (Signed)
Subjective:    Patient ID: Kiara Medina, female    DOB: December 18, 1945, 72 y.o.   MRN: [id number]  Synopsis: Former patient of Dr. Gwenette Greet who has MAI.  Has been treated since December 2015 as guided by Dr. Megan Salon and infectious diseases. She had bronchiectasis seen on a chest CT in 2011. 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 In 2015 she did produce mucus which grew no cardiac and Mycobacterium avium complex. She was started on 3 drug therapy. Smoked 1ppd for 25 years, quit 1987 SSpiro 2010:  FVC 2.61 (87%), FEV1 2.11 (97%), ratio 81 Spiro 2015:  FVC 1.86 (65%), FEV1 1.34 (61%), ratio 72   HPI Chief Complaint  Patient presents with  . Follow-up    Pt has chronic cough- cream color   There has been a lot of water under the bridge since I saw [name] last.  In the summer 2018 she developed bronchitis which eventually led to hemoptysis.  She was seen by Dr. Megan Salon and a sputum culture was performed which confirmed recurrence of MAI.  She was started on 3 times a week multidrug therapy but continued to have worrisome symptoms.  She says that after starting 3 drug therapy in September 2018 she stopped producing mucus but still had a dry cough.  She was referred to Dr. Lorenda Cahill at [facility] who recommended that she start using inhaled amikacin.  Around that time she says she started having increasing shortness of breath and more cough with mucus production.  She denies fevers or chills or weight loss at this point but she is quite concerned about the progressive shortness of breath and cough with mucus production.  She says that she uses albuterol on an occasional basis not very often.   Past Medical History:  Diagnosis Date  . Allergic rhinitis, cause unspecified   . Bronchiectasis without acute exacerbation ([place])   . Cough   . Sinusitis       Review of Systems  Constitutional: Negative for chills, fatigue and fever.  HENT: Negative for  nosebleeds, postnasal drip and rhinorrhea.   Respiratory: Negative for cough, shortness of breath, wheezing and stridor.   Cardiovascular: Negative for chest pain, palpitations and leg swelling.       Objective:   Physical Exam Vitals:   03/24/18 1646  BP: 118/71  Pulse: 86  SpO2: 96%  Weight: 131 lb (59.4 kg)  Height: _0  (1.6 m)   RA  Gen: well appearing HENT: OP clear, TM's clear, neck supple PULM: Clear to auscultation bilaterally, normal effort CV: RRR, no mgr, trace edema GI: BS+, soft, nontender Derm: no cyanosis or rash Psyche: normal mood and affect  PFT SSpiro 2010:  FVC 2.61 (87%), FEV1 2.11 (97%), ratio 81 Spiro 2015:  FVC 1.86 (65%), FEV1 1.34 (61%), ratio 72  Chest imaging: January 2017 chest x-ray images independently reviewed showing bronchiectatic changes in the bases bilaterally February 2015 high-resolution CT chest images independently reviewed showing basilar predominant tree-in-bud abnormalities, scattered bronchiectasis most predominant in the lingula in the right middle lobe as well as some centrilobular emphysema.  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  Reviewing the microbiology results recently is concerning because despite multidrug treatment as well as inhaled amikacin she still has persistently positive sputum cultures.  It is entirely possible that the MAI is the explanation for her symptoms since it has been so difficult to control.  Alternatively I think it is important  to consider whether or not she could be having more COPD symptoms now, she did smoke heavily years ago.  Another remote possibility is that the air case is causing some degree of pneumonitis, this was reported in safety reports during its clinical trials.  Plan: Increased shortness of breath and cough: I am going to get a lung function test and chest x-ray to look for COPD and inflammation caused by the inhaled amikacin Use albuterol 2 puffs prior to using the inhaled amikacin Use a spacer with the albuterol  MAI: Continue treatment as directed by the [facility]  Follow-up with me or 1 of the nurse practitioners in 2 weeks to go over this result    Current Outpatient Medications:  .  acyclovir (ZOVIRAX) 400 MG tablet, Take 1 tablet (400 mg total) by mouth 5 (five) times daily., Disp: , Rfl:  .  albuterol (PROVENTIL HFA;VENTOLIN HFA) 108 (90 Base) MCG/ACT inhaler, Inhale 2 puffs into the lungs every 6 (six) hours as needed for wheezing or shortness of breath., Disp: 1 Inhaler, Rfl: 0 .  albuterol (PROVENTIL HFA;VENTOLIN HFA) 108 (90 Base) MCG/ACT inhaler, Inhale 2 puffs into the lungs every 6 (six) hours as needed for wheezing or shortness of breath., Disp: 1 Inhaler, Rfl: 6 .  Amikacin Sulfate Liposome (ARIKAYCE) 590 MG/8.4ML SUSP, Inhale 590 mg into the lungs daily., Disp: 252 mL, Rfl: 11 .  atorvastatin (LIPITOR) 20 MG tablet, Take 20 mg by mouth daily., Disp: , Rfl:  .  azithromycin (ZITHROMAX) 250 MG tablet, Take 1 tablet (250 mg total) by mouth daily., Disp: 30 each, Rfl: 11 .  B Complex Vitamins (B COMPLEX-B12 PO), Take 1 tablet by mouth daily., Disp: , Rfl:  .  BIOTIN 5000 PO, Take 2 capsules by mouth daily., Disp: , Rfl:  .  Calcium Carbonate-Vitamin D (CALCIUM-VITAMIN D3) 600-200 MG-UNIT TABS, Take 1 tablet by mouth 2 (two) times daily.  , Disp: , Rfl:  .  Cholecalciferol (VITAMIN D3) 1000 UNITS CAPS, Take 2,000 Units by mouth daily., Disp: , Rfl:    .  Docusate Calcium (STOOL SOFTENER PO), Take 100 mg by mouth 2 (two) times daily. , Disp: , Rfl:  .  ethambutol (MYAMBUTOL) 400 MG tablet, Take 3 tablets (1,200 mg total) by mouth daily., Disp: 90 tablet, Rfl: 11 .  Flaxseed, Linseed, (FLAX SEED OIL) 1300 MG CAPS, Take by mouth daily., Disp: , Rfl:  .  minocycline (DYNACIN) 100 MG tablet, Take 100 mg by mouth 1 day or 1 dose., Disp: , Rfl:  .  omeprazole (PRILOSEC) 40 MG capsule, TAKE 1 CAPSULE TWICE DAILY, Disp: 180 capsule, Rfl: 1 .  Probiotic Product (PROBIOTIC DAILY PO), Take 1 capsule by mouth daily., Disp: , Rfl:  .  rifampin (RIFADIN) 300 MG capsule, Take 2 capsules (600 mg total) by mouth daily., Disp: 60 capsule, Rfl: 11 .  TURMERIC PO, Take 2,000 mg by mouth daily. , Disp: , Rfl:  .  vitamin B-12 (CYANOCOBALAMIN) 1000 MCG tablet, Take 2,000 mcg by mouth daily., Disp: , Rfl:

## 2018-03-25 ENCOUNTER — Other Ambulatory Visit: Payer: Self-pay | Admitting: Internal Medicine

## 2018-03-25 MED ORDER — AEROCHAMBER MV MISC: 2 refills | Status: DC

## 2018-03-25 NOTE — Addendum Note (Signed)
Addended by: Georjean Mode on: 03/25/2018 02:02 PM   Modules accepted: Orders

## 2018-03-28 ENCOUNTER — Ambulatory Visit: Payer: Medicare HMO | Admitting: Internal Medicine

## 2018-03-28 ENCOUNTER — Telehealth: Payer: Self-pay

## 2018-03-28 ENCOUNTER — Other Ambulatory Visit: Payer: Medicare HMO

## 2018-03-28 ENCOUNTER — Ambulatory Visit: Payer: Medicare HMO | Admitting: Pulmonary Disease

## 2018-03-28 ENCOUNTER — Encounter: Payer: Self-pay | Admitting: Internal Medicine

## 2018-03-28 DIAGNOSIS — A310 Pulmonary mycobacterial infection: Secondary | ICD-10-CM | POA: Diagnosis not present

## 2018-03-28 LAB — COMPREHENSIVE METABOLIC PANEL
AG Ratio: 1.7 (calc) (ref 1.0–2.5)
ALBUMIN MSPROF: 4.1 g/dL (ref 3.6–5.1)
ALT: 17 U/L (ref 6–29)
AST: 25 U/L (ref 10–35)
Alkaline phosphatase (APISO): 69 U/L (ref 33–130)
BUN: 20 mg/dL (ref 7–25)
CO2: 29 mmol/L (ref 20–32)
CREATININE: 0.84 mg/dL (ref 0.60–0.93)
Calcium: 9.7 mg/dL (ref 8.6–10.4)
Chloride: 105 mmol/L (ref 98–110)
Globulin: 2.4 g/dL (calc) (ref 1.9–3.7)
Glucose, Bld: 86 mg/dL (ref 65–99)
POTASSIUM: 4.1 mmol/L (ref 3.5–5.3)
Sodium: 141 mmol/L (ref 135–146)
Total Bilirubin: 0.7 mg/dL (ref 0.2–1.2)
Total Protein: 6.5 g/dL (ref 6.1–8.1)

## 2018-03-28 LAB — CBC
HCT: 43.8 % (ref 35.0–45.0)
Hemoglobin: 15.1 g/dL (ref 11.7–15.5)
MCH: 31.5 pg (ref 27.0–33.0)
MCHC: 34.5 g/dL (ref 32.0–36.0)
MCV: 91.3 fL (ref 80.0–100.0)
MPV: 11 fL (ref 7.5–12.5)
PLATELETS: 159 10*3/uL (ref 140–400)
RBC: 4.8 10*6/uL (ref 3.80–5.10)
RDW: 12.9 % (ref 11.0–15.0)
WBC: 6 10*3/uL (ref 3.8–10.8)

## 2018-03-28 NOTE — Assessment & Plan Note (Signed)
She has not made much progress back on 4 drug therapy for relapsed, drug resistant Mycobacterium avium pneumonia.  She feels like she is tolerating her treatments a little bit better and wants to continue them.  She has had no change in her hearing and is scheduled for monthly audiograms.  Her sputum culture remained positive on 02/24/2018.  She will submit a repeat sputum sample today.  She will also get blood work today and follow-up in 1 month.

## 2018-03-28 NOTE — Telephone Encounter (Signed)
This nurse called [facility] to schedule patients monthly hearing tests per the request of Dr. Pamalee Leyden. This nurse spoke with CC at [facility] who informed this nurse that the office is aware of the need for monthly hearing test and that she has an upcoming appointment on Wednesday 03/30/2018 and they will schedule next months at that visit. Pola Corn, LPN

## 2018-03-28 NOTE — Progress Notes (Signed)
[facility]  Patient Active Problem List   Diagnosis Date Noted  . 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 BRONCHIECTASIS 05/23/2009    Priority: Medium  . Dysphonia 01/11/2018  . Diarrhea 01/11/2018  . Right shoulder pain 01/21/2017  . Encounter for hepatitis C screening test for low risk patient 01/21/2017  . GERD (gastroesophageal reflux disease) 02/21/2016  . 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 Dyslipidemia 11/14/2014  . ALLERGIC RHINITIS 05/03/2009    Patient's Medications  New Prescriptions   No medications on file  Previous Medications   ACYCLOVIR (ZOVIRAX) 400 MG TABLET    Take 1 tablet (400 mg total) by mouth 5 (five) times daily.   ALBUTEROL (PROVENTIL HFA;VENTOLIN HFA) 108 (90 BASE) MCG/ACT INHALER    Inhale 2 puffs into the lungs every 6 (six) hours as needed for wheezing or shortness of breath.   ALBUTEROL (PROVENTIL HFA;VENTOLIN HFA) 108 (90 BASE) MCG/ACT INHALER    Inhale 2 puffs into the lungs every 6 (six) hours as needed for wheezing or shortness of breath.   AMIKACIN SULFATE LIPOSOME (ARIKAYCE) 590 MG/8.4ML SUSP    Inhale 590 mg into the lungs daily.   ATORVASTATIN (LIPITOR) 20 MG TABLET    Take 20 mg by mouth daily.   AZITHROMYCIN (ZITHROMAX) 250 MG TABLET    Take 1 tablet (250 mg total) by mouth daily.   B COMPLEX VITAMINS (B COMPLEX-B12 PO)    Take 1 tablet by mouth daily.   BIOTIN 5000 PO    Take 2 capsules by mouth daily.   CALCIUM CARBONATE-VITAMIN D (CALCIUM-VITAMIN D3) 600-200 MG-UNIT TABS    Take 1 tablet by mouth 2 (two) times daily.     CHOLECALCIFEROL (VITAMIN D3) 1000 UNITS CAPS    Take 2,000 Units by mouth daily.   DOCUSATE CALCIUM (STOOL SOFTENER PO)    Take 100 mg by mouth 2 (two) times daily.    ETHAMBUTOL (MYAMBUTOL) 400 MG TABLET    Take 3 tablets (1,200 mg total) by mouth daily.   FLAXSEED, LINSEED, (FLAX SEED OIL) 1300 MG CAPS    Take by mouth daily.   MINOCYCLINE  (DYNACIN) 100 MG TABLET    Take 100 mg by mouth 1 day or 1 dose.   OMEPRAZOLE (PRILOSEC) 40 MG CAPSULE    TAKE 1 CAPSULE TWICE DAILY   PROBIOTIC PRODUCT (PROBIOTIC DAILY PO)    Take 1 capsule by mouth daily.   RIFAMPIN (RIFADIN) 300 MG CAPSULE    Take 2 capsules (600 mg total) by mouth daily.   SPACER/AERO-HOLDING CHAMBERS (AEROCHAMBER MV) INHALER    Use as instructed   SPACER/AERO-HOLDING CHAMBERS (AEROCHAMBER MV) INHALER    Use as instructed   TURMERIC PO    Take 2,000 mg by mouth daily.    VITAMIN B-12 (CYANOCOBALAMIN) 1000 MCG TABLET    Take 2,000 mcg by mouth daily.  Modified Medications   No medications on file  Discontinued Medications   No medications on file    Subjective: Kiara Medina is in for her routine follow-up visit.  She has relapsed, resistant Mycobacterium avium pneumonia.  She has been back on daily azithromycin, ethambutol and rifampin since early January.  She started aerosolized amikacin in mid January. She has not noted any change in the sputum that she produces each day.  Her cough varies day today.  She does not feel like her shortness of breath has improved she is having frequent  soft bowel movements, 4-5 times daily.  She has tried taking an over-the-counter diarrhea pill.  It does help but she is only taking it 1 or 2 times each week.    Review of Systems: Review of Systems  Constitutional: Positive for malaise/fatigue. Negative for chills, diaphoresis, fever and weight loss.  HENT: Positive for tinnitus. Negative for congestion, hearing loss and sore throat.        She has had no change in her hearing or chronic tinnitus.  Eyes:       No change in vision.  She has a cataract but is uncertain when she would be willing to have surgery.  She sees her ophthalmologist in May.  Respiratory: Positive for cough, sputum production and shortness of breath. Negative for hemoptysis and wheezing.   Cardiovascular: Negative for chest pain.  Gastrointestinal: Negative for abdominal  pain, diarrhea, heartburn, nausea and vomiting.       As noted in HPI.  Musculoskeletal: Positive for joint pain.       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  She is scheduling a follow-up visit with her orthopedic surgeon, Dr. Amedeo Plenty.  Skin: Negative for rash.  Neurological: Negative for dizziness and headaches.    Past Medical History:  Diagnosis Date  . Allergic rhinitis, cause unspecified   . Bronchiectasis without acute exacerbation ([place])   . Cough   . Sinusitis     Social History   Tobacco Use  . Smoking status: Former Smoker    Packs/day: 1.00    Years: 20.00    Pack years: 20.00    Types: Cigarettes    Last attempt to quit: 12/14/1985    Years since quitting: 32.3  . Smokeless tobacco: Never Used  Substance Use Topics  . Alcohol use: Yes    Alcohol/week: 0.6 oz    Types: 1 Glasses of wine per week    Comment: occ  . Drug use: No    Family History  Problem Relation Age of Onset  . Emphysema Sister   . Stroke Mother   . Clotting disorder Mother     No Known Allergies  Objective: Vitals:   03/28/18 1610  BP: 126/82  Pulse: 82  Temp: 97.8 F (36.6 C)  TempSrc: Oral  Weight: 136 lb (61.7 kg)  Height: 5' 3.5" (1.613 m)   Body mass index is 23.71 kg/m.  Physical Exam  Constitutional: She is oriented to person, place, and time.  She is in good spirits.  Cardiovascular: Normal rate and regular rhythm.  No murmur heard. Pulmonary/Chest: Effort normal. She has no wheezes. She has no rales.  Abdominal: Soft. She exhibits no distension. There is no tenderness.  Neurological: She is alert and oriented to person, place, and time.  Psychiatric: Affect normal.    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  She feels like she is tolerating her treatments a little bit better and wants to continue them.  She has had no change in her hearing and is scheduled for monthly audiograms.  Her sputum culture remained positive on 02/24/2018.  She will submit a repeat sputum sample today.  She will also get blood work today and follow-up in 1 month.  Relevant Orders   CBC   Comprehensive metabolic panel   MYCOBACTERIA, CULTURE, WITH FLUOROCHROME SMEAR       Michel Bickers, MD [facility] for [facility] [phone] pager   [phone] cell 03/28/2018, 5:20 PM

## 2018-03-29 ENCOUNTER — Telehealth: Payer: Self-pay | Admitting: *Deleted

## 2018-03-29 NOTE — Telephone Encounter (Signed)
Patient requested to reschedule her follow up with Dr Megan Salon to the 4 weeks he recommended, not 6 weeks. Rn left message offering 5/14 or 5/16 morning appointments, asked her to call back and confirm. Tentatively scheduled for 5/16 at 11:15. Landis Gandy, RN

## 2018-03-31 LAB — MYCOBACTERIA,CULT W/FLUOROCHROME SMEAR
MICRO NUMBER:: 90199513
SPECIMEN QUALITY:: ADEQUATE

## 2018-03-31 LAB — M. AVIUM MIC PANEL
AMIKACIN: 32
CIPROFLOXACIN: 8
ETHIONAMIDE: 1.2
ISONIAZID: 4
RIFABUTIN: 1
RIFAMPIN: >8
STREPTOMYCIN: 64

## 2018-04-11 ENCOUNTER — Telehealth: Payer: Self-pay

## 2018-04-11 NOTE — Telephone Encounter (Signed)
Barbaraann Rondo from [facility] lab called with Urgent lab result from Mycrobacterium culture with smear collected on 03/28/2018. Please look at results and advise. Pola Corn, LPN

## 2018-04-13 DIAGNOSIS — H02403 Unspecified ptosis of bilateral eyelids: Secondary | ICD-10-CM | POA: Diagnosis not present

## 2018-04-13 DIAGNOSIS — Z961 Presence of intraocular lens: Secondary | ICD-10-CM | POA: Diagnosis not present

## 2018-04-13 DIAGNOSIS — H16102 Unspecified superficial keratitis, left eye: Secondary | ICD-10-CM | POA: Diagnosis not present

## 2018-04-13 DIAGNOSIS — H9313 Tinnitus, bilateral: Secondary | ICD-10-CM | POA: Diagnosis not present

## 2018-04-13 DIAGNOSIS — H2511 Age-related nuclear cataract, right eye: Secondary | ICD-10-CM | POA: Diagnosis not present

## 2018-04-13 DIAGNOSIS — H11822 Conjunctivochalasis, left eye: Secondary | ICD-10-CM | POA: Diagnosis not present

## 2018-04-13 DIAGNOSIS — Z9842 Cataract extraction status, left eye: Secondary | ICD-10-CM | POA: Diagnosis not present

## 2018-04-13 DIAGNOSIS — Z822 Family history of deafness and hearing loss: Secondary | ICD-10-CM | POA: Diagnosis not present

## 2018-04-13 DIAGNOSIS — H903 Sensorineural hearing loss, bilateral: Secondary | ICD-10-CM | POA: Diagnosis not present

## 2018-04-15 LAB — MYCOBACTERIA,CULT W/FLUOROCHROME SMEAR
MICRO NUMBER: 90325538
SPECIMEN QUALITY: ADEQUATE

## 2018-04-18 ENCOUNTER — Ambulatory Visit: Payer: Medicare HMO | Admitting: Pulmonary Disease

## 2018-04-18 ENCOUNTER — Ambulatory Visit: Payer: Medicare HMO | Admitting: Adult Health

## 2018-04-18 ENCOUNTER — Encounter: Payer: Self-pay | Admitting: Adult Health

## 2018-04-18 DIAGNOSIS — J479 Bronchiectasis, uncomplicated: Secondary | ICD-10-CM

## 2018-04-18 DIAGNOSIS — A310 Pulmonary mycobacterial infection: Secondary | ICD-10-CM

## 2018-04-18 DIAGNOSIS — R0602 Shortness of breath: Secondary | ICD-10-CM

## 2018-04-18 NOTE — Assessment & Plan Note (Signed)
Cont w/ ID follow up

## 2018-04-18 NOTE — Assessment & Plan Note (Signed)
Cont w/ mucocillary clearance  Add Flutter valve   Plan  Patient Instructions  Continue on current regimen .  Follow up with ID clinic .  Add Flutter valve Twice daily  .  Follow up with Dr. Lake Bells 6 months and As needed

## 2018-04-18 NOTE — Assessment & Plan Note (Signed)
Control for triggers  

## 2018-04-18 NOTE — Progress Notes (Signed)
Patient had difficult time with pre spirometry, and refused to continue the full PFT test today.

## 2018-04-18 NOTE — Progress Notes (Signed)
@Patient  ID: Kiara Medina, female    DOB: 11/28/1946, 72 y.o.   MRN: [id number]  No chief complaint on file.   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 2018 developed bronchitis with hemoptysis.  Sputum culture confirmed evidence of MAI she was started on 3 times a week multidrug therapy from December 2018 to September 2018.  Referral to [facility] , Dr Kiara Medina .  Started on inhaled amikacin  TEST  SSpiro 2010:  FVC 2.61 (87%), FEV1 2.11 (97%), ratio 81 Spiro 2015:  FVC 1.86 (65%), FEV1 1.34 (61%), ratio 72   Chest imaging: January 2017 chest x-ray images independently reviewed showing bronchiectatic changes in the bases bilaterally February 2015 high-resolution CT chest images independently reviewed showing basilar predominant tree-in-bud abnormalities, scattered bronchiectasis most predominant in the lingula in the right middle lobe as well as some centrilobular emphysema.  Microbiology: June 2018 sputum culture positive for MAI September 2018 sputum culture positive for MAI January 27, 2018 sputum culture positive for MAI March 2014 sputum culture positive for MAI   04/18/2018 Follow up: Bronchiectasis, MAI Patient returns for a one-month follow-up.  She has underlying bronchiectasis with MAI.  She is followed at infectious disease clinic.  She is on multidrug therapy including azithromycin, rifampin, ethambutol and inhaled amikacin.  She gets monthly sputum cultures.  Most recent sputum cultures remain positive for MAI.  She says she is tolerating her multidrug therapy as well as be expected.  Does have some intermittent GI issues but are manageable. She does feel fatigued.  And has a daily cough.  She says she does get up quite a bit of mucus at  times. She was for pulmonary function testing today however says that she is not able to do this due to her coughing. Weight has been stable.  Appetite is down but she says she tries to eat healthy. Chest x-ray last visit showed chronic changes.      No Known Allergies  Immunization History  Administered Date(s) Administered  . Influenza Split 09/13/2012, 09/06/2014  . Influenza Whole 09/09/2009, 08/14/2010, 09/14/2011  . Influenza, High Dose Seasonal PF 10/10/2015  . Influenza,inj,Quad PF,6+ Mos 09/13/2013, 09/21/2016, 08/26/2017  . Pneumococcal Conjugate-13 11/24/2014  . Pneumococcal Polysaccharide-23 07/17/2009    Past Medical History:  Diagnosis Date  . Allergic rhinitis, cause unspecified   . Bronchiectasis without acute exacerbation ([place])   . Cough   . Sinusitis     Tobacco History: Social History   Tobacco Use  Smoking Status Former Smoker  . Packs/day: 1.00  . Years: 20.00  . Pack years: 20.00  . Types: Cigarettes  . Last attempt to quit: 12/14/1985  . Years since quitting: 32.3  Smokeless Tobacco Never Used   Counseling given: Not Answered   Outpatient Encounter Medications as of 04/18/2018  Medication Sig  . acyclovir (ZOVIRAX) 400 MG tablet Take 1 tablet (400 mg total) by mouth 5 (five) times daily. (Patient taking differently: Take 400 mg by mouth 2 (two) times daily. )  . albuterol (PROVENTIL HFA;VENTOLIN HFA) 108 (90 Base) MCG/ACT inhaler Inhale 2 puffs into the lungs every 6 (six) hours as needed for wheezing or shortness of breath.  Marland Kitchen albuterol (PROVENTIL HFA;VENTOLIN HFA) 108 (90 Base) MCG/ACT inhaler Inhale 2 puffs into the lungs every 6 (six) hours as needed for  wheezing or shortness of breath.  . Amikacin Sulfate Liposome (ARIKAYCE) 590 MG/8.4ML SUSP Inhale 590 mg into the lungs daily.  Marland Kitchen atorvastatin (LIPITOR) 20 MG tablet Take 20 mg by mouth daily.  Marland Kitchen azithromycin (ZITHROMAX) 250 MG tablet Take 1 tablet (250 mg total) by mouth daily.  . B Complex  Vitamins (B COMPLEX-B12 PO) Take 1 tablet by mouth daily.  Marland Kitchen BIOTIN 5000 PO Take 2 capsules by mouth daily.  . Calcium Carbonate-Vitamin D (CALCIUM-VITAMIN D3) 600-200 MG-UNIT TABS Take 1 tablet by mouth 2 (two) times daily.    . Cholecalciferol (VITAMIN D3) 1000 UNITS CAPS Take 2,000 Units by mouth daily.  Kiara Medina Calcium (STOOL SOFTENER PO) Take 100 mg by mouth 2 (two) times daily.   Marland Kitchen ethambutol (MYAMBUTOL) 400 MG tablet Take 3 tablets (1,200 mg total) by mouth daily.  . Flaxseed, Linseed, (FLAX SEED OIL) 1300 MG CAPS Take by mouth daily.  Marland Kitchen omeprazole (PRILOSEC) 40 MG capsule TAKE 1 CAPSULE TWICE DAILY  . Probiotic Product (PROBIOTIC DAILY PO) Take 1 capsule by mouth daily.  . rifampin (RIFADIN) 300 MG capsule Take 2 capsules (600 mg total) by mouth daily.  Marland Kitchen Spacer/Aero-Holding Chambers (AEROCHAMBER MV) inhaler Use as instructed  . Spacer/Aero-Holding Chambers (AEROCHAMBER MV) inhaler Use as instructed  . TURMERIC PO Take 2,000 mg by mouth daily.   . vitamin B-12 (CYANOCOBALAMIN) 1000 MCG tablet Take 2,000 mcg by mouth daily.  . minocycline (DYNACIN) 100 MG tablet Take 100 mg by mouth 1 day or 1 dose.   No facility-administered encounter medications on file as of 04/18/2018.      Review of Systems  Constitutional:   No  weight loss, night sweats,  Fevers, chills, fatigue, or  lassitude.  HEENT:   No headaches,  Difficulty swallowing,  Tooth/dental problems, or  Sore throat,                No sneezing, itching, ear ache,  +nasal congestion, post nasal drip,   CV:  No chest pain,  Orthopnea, PND, swelling in lower extremities, anasarca, dizziness, palpitations, syncope.   GI  No heartburn, indigestion, abdominal pain, nausea, vomiting, diarrhea, change in bowel habits, loss of appetite, bloody stools.   Resp: No chest wall deformity  Skin: no rash or lesions.  GU: no dysuria, change in color of urine, no urgency or frequency.  No flank pain, no hematuria   MS:  No joint pain  or swelling.  No decreased range of motion.  No back pain.    Physical Exam  BP 124/70 (BP Location: Left Arm, Cuff Size: Normal)   Pulse 73   Ht 5' 2"  (1.575 m)   Wt 135 lb (61.2 kg)   SpO2 97%   BMI 24.69 kg/m   GEN: A/Ox3; pleasant , NAD, elderly    HEENT:  [place]/AT,  EACs-clear, TMs-wnl, NOSE-clear, THROAT-clear, no lesions, no postnasal drip or exudate noted.   NECK:  Supple w/ fair ROM; no JVD; normal carotid impulses w/o bruits; no thyromegaly or nodules palpated; no lymphadenopathy.    RESP  Clear  P & A; w/o, wheezes/ rales/ or rhonchi. no accessory muscle use, no dullness to percussion  CARD:  RRR, no m/r/g, no peripheral edema, pulses intact, no cyanosis or clubbing.  GI:   Soft & nt; nml bowel sounds; no organomegaly or masses detected.   Musco: Warm bil, no deformities or joint swelling noted.   Neuro: alert, no focal deficits noted.    Skin: Warm, no lesions  or rashes    Lab Results:  CBC    Component Value Date/Time   WBC 6.0 03/28/2018 1653   RBC 4.80 03/28/2018 1653   HGB 15.1 03/28/2018 1653   HCT 43.8 03/28/2018 1653   PLT 159 03/28/2018 1653   MCV 91.3 03/28/2018 1653   MCH 31.5 03/28/2018 1653   MCHC 34.5 03/28/2018 1653   RDW 12.9 03/28/2018 1653    BMET    Component Value Date/Time   NA 141 03/28/2018 1653   K 4.1 03/28/2018 1653   CL 105 03/28/2018 1653   CO2 29 03/28/2018 1653   GLUCOSE 86 03/28/2018 1653   BUN 20 03/28/2018 1653   CREATININE 0.84 03/28/2018 1653   CALCIUM 9.7 03/28/2018 1653   GFRNONAA >60 06/01/2011 1457   GFRAA >60 06/01/2011 1457    BNP No results found for: BNP  ProBNP No results found for: PROBNP  Imaging: Dg Chest 2 View  Result Date: 03/25/2018 CLINICAL DATA:  Shortness of breath and cough. History of MAI and bronchiectasis. EXAM: CHEST - 2 VIEW COMPARISON:  12/30/2017 FINDINGS: The cardiomediastinal silhouette is unchanged and within normal limits. Diffuse bilateral interstitial densities and  right greater than left lung patchy/nodular opacities are similar to the prior study and consistent with the patient's history of MAI and bronchiectasis. No definite acute airspace disease is identified. No pleural effusion or pneumothorax is seen. No acute osseous abnormality is identified. IMPRESSION: Chronic lung changes without evidence of acute abnormality. Electronically Signed   By: Logan Bores M.D.   On: 03/25/2018 08:59     Assessment & Plan:   BRONCHIECTASIS Cont w/ mucocillary clearance  Add Flutter valve   Plan  Patient Instructions  Continue on current regimen .  Follow up with ID clinic .  Add Flutter valve Twice daily  .  Follow up with Dr. 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

## 2018-04-18 NOTE — Patient Instructions (Signed)
Continue on current regimen .  Follow up with ID clinic .  Add Flutter valve Twice daily  .  Follow up with Dr. Lake Bells 6 months and As needed

## 2018-04-19 DIAGNOSIS — R05 Cough: Secondary | ICD-10-CM | POA: Diagnosis not present

## 2018-04-19 MED ORDER — FLUTTER DEVI: 0 refills | Status: DC

## 2018-04-19 NOTE — Addendum Note (Signed)
Addended by: Parke Poisson E on: 04/19/2018 11:37 AM   Modules accepted: Orders

## 2018-04-19 NOTE — Progress Notes (Signed)
Patient seen in the office today and instructed on use of Flutter valve.  Patient expressed understanding and demonstrated technique. Parke Poisson, CMA 04/18/2018

## 2018-04-20 NOTE — Progress Notes (Signed)
Reviewed, agree 

## 2018-04-25 ENCOUNTER — Ambulatory Visit: Payer: Medicare HMO | Admitting: Pulmonary Disease

## 2018-04-26 ENCOUNTER — Encounter: Payer: Self-pay | Admitting: Internal Medicine

## 2018-04-26 ENCOUNTER — Ambulatory Visit: Payer: Medicare HMO | Admitting: Internal Medicine

## 2018-04-26 DIAGNOSIS — A310 Pulmonary mycobacterial infection: Secondary | ICD-10-CM

## 2018-04-26 LAB — PULMONARY FUNCTION TEST
FEF 25-75 PRE: 1.2 L/s
FEF2575-%Pred-Pre: 74 %
FEV1-%Pred-Pre: 80 %
FEV1-PRE: 1.57 L
FEV1FVC-%PRED-PRE: 100 %
FEV6-%Pred-Pre: 82 %
FEV6-Pre: 2.06 L
FEV6FVC-%PRED-PRE: 103 %
FVC-%Pred-Pre: 79 %
FVC-PRE: 2.08 L
PRE FEV1/FVC RATIO: 75 %
PRE FEV6/FVC RATIO: 99 %

## 2018-04-26 NOTE — Assessment & Plan Note (Signed)
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  It is difficult to know if her current antibiotics are making much difference.  She remains culture + 3 months into treatment.  I suspect that her current antibiotics are preventing her infection from getting worse but this comes with the cost of side effects of her treatments.  I will obtain sputum for repeat culture today and see her back in 1 month.  She agrees with continuing current antibiotics for now.

## 2018-04-26 NOTE — Addendum Note (Signed)
Addended by: Alberteen Sam on: 04/26/2018 11:29 AM   Modules accepted: Orders

## 2018-04-26 NOTE — Progress Notes (Signed)
[place] for Infectious Disease  Patient Active Problem List   Diagnosis Date Noted  . 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 BRONCHIECTASIS 05/23/2009    Priority: Medium  . Dysphonia 01/11/2018  . Diarrhea 01/11/2018  . Right shoulder pain 01/21/2017  . Encounter for hepatitis C screening test for low risk patient 01/21/2017  . GERD (gastroesophageal reflux disease) 02/21/2016  . 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 Dyslipidemia 11/14/2014  . Allergic rhinitis 05/03/2009    Patient's Medications  New Prescriptions   No medications on file  Previous Medications   ACYCLOVIR (ZOVIRAX) 400 MG TABLET    Take 1 tablet (400 mg total) by mouth 5 (five) times daily.   ALBUTEROL (PROVENTIL HFA;VENTOLIN HFA) 108 (90 BASE) MCG/ACT INHALER    Inhale 2 puffs into the lungs every 6 (six) hours as needed for wheezing or shortness of breath.   ALBUTEROL (PROVENTIL HFA;VENTOLIN HFA) 108 (90 BASE) MCG/ACT INHALER    Inhale 2 puffs into the lungs every 6 (six) hours as needed for wheezing or shortness of breath.   AMIKACIN SULFATE LIPOSOME (ARIKAYCE) 590 MG/8.4ML SUSP    Inhale 590 mg into the lungs daily.   ATORVASTATIN (LIPITOR) 20 MG TABLET    Take 20 mg by mouth daily.   AZITHROMYCIN (ZITHROMAX) 250 MG TABLET    Take 1 tablet (250 mg total) by mouth daily.   B COMPLEX VITAMINS (B COMPLEX-B12 PO)    Take 1 tablet by mouth daily.   BIOTIN 5000 PO    Take 2 capsules by mouth daily.   CALCIUM CARBONATE-VITAMIN D (CALCIUM-VITAMIN D3) 600-200 MG-UNIT TABS    Take 1 tablet by mouth 2 (two) times daily.     CHOLECALCIFEROL (VITAMIN D3) 1000 UNITS CAPS    Take 2,000 Units by mouth daily.   DOCUSATE CALCIUM (STOOL SOFTENER PO)    Take 100 mg by mouth 2 (two) times daily.    ETHAMBUTOL (MYAMBUTOL) 400 MG TABLET    Take 3 tablets (1,200 mg total) by mouth daily.   FLAXSEED, LINSEED, (FLAX SEED OIL) 1300 MG CAPS    Take by mouth daily.   MINOCYCLINE  (DYNACIN) 100 MG TABLET    Take 100 mg by mouth 1 day or 1 dose.   OMEPRAZOLE (PRILOSEC) 40 MG CAPSULE    TAKE 1 CAPSULE TWICE DAILY   PROBIOTIC PRODUCT (PROBIOTIC DAILY PO)    Take 1 capsule by mouth daily.   RESPIRATORY THERAPY SUPPLIES (FLUTTER) DEVI    Use as directed.   RIFAMPIN (RIFADIN) 300 MG CAPSULE    Take 2 capsules (600 mg total) by mouth daily.   SPACER/AERO-HOLDING CHAMBERS (AEROCHAMBER MV) INHALER    Use as instructed   SPACER/AERO-HOLDING CHAMBERS (AEROCHAMBER MV) INHALER    Use as instructed   TURMERIC PO    Take 2,000 mg by mouth daily.    VITAMIN B-12 (CYANOCOBALAMIN) 1000 MCG TABLET    Take 2,000 mcg by mouth daily.  Modified Medications   No medications on file  Discontinued Medications   No medications on file    Subjective: Kiara Medina is in for her routine follow-up visit.  She has relapsed, resistant Mycobacterium avium pneumonia.  She has been back on daily azithromycin, ethambutol and rifampin since early January.  She started aerosolized amikacin in mid January. She has not noted any change in the sputum that she produces each day.  Her cough varies day to day.  She  thinks her dyspnea on exertion may be slightly better.  She continues to have frequent soft bowel movements, 4-5 times daily.  She has not had any change in her chronic tinnitus.  She has had no changes in her hearing noted on monthly audiograms.  She has had no change in her vision.  Her renal function remains normal.  Review of Systems: Review of Systems  Constitutional: Positive for malaise/fatigue. Negative for chills, diaphoresis, fever and weight loss.  HENT: Positive for tinnitus. Negative for congestion, hearing loss and sore throat.        She has had no change in her hearing or chronic tinnitus.  Eyes:       No change in vision.  She has a cataract but is uncertain when she would be willing to have surgery.    Respiratory: Positive for cough, sputum production and shortness of breath. Negative for  hemoptysis and wheezing.   Cardiovascular: Negative for chest pain.  Gastrointestinal: Positive for diarrhea and nausea. Negative for abdominal pain, heartburn and vomiting.       As noted in HPI.  Musculoskeletal: Positive for joint pain.  Skin: Negative for rash.  Neurological: Negative for dizziness and headaches.    Past Medical History:  Diagnosis Date  . Allergic rhinitis, cause unspecified   . Bronchiectasis without acute exacerbation ([place])   . Cough   . Sinusitis     Social History   Tobacco Use  . Smoking status: Former Smoker    Packs/day: 1.00    Years: 20.00    Pack years: 20.00    Types: Cigarettes    Last attempt to quit: 12/14/1985    Years since quitting: 32.3  . Smokeless tobacco: Never Used  Substance Use Topics  . Alcohol use: Yes    Alcohol/week: 0.6 oz    Types: 1 Glasses of wine per week    Comment: occ  . Drug use: No    Family History  Problem Relation Age of Onset  . Emphysema Sister   . Stroke Mother   . Clotting disorder Mother     No Known Allergies  Objective: Vitals:   04/26/18 1056  BP: 125/78  Pulse: 89  Temp: 98.1 F (36.7 C)  TempSrc: Oral  Weight: 134 lb 4 oz (60.9 kg)  Height: 5\' 3"  (1.6 m)   Body mass index is 23.78 kg/m.  Physical Exam  Constitutional: She is oriented to person, place, and time.  She is in good spirits.  Cardiovascular: Normal rate and regular rhythm.  No murmur heard. Pulmonary/Chest: Effort normal. She has no wheezes. She has no rales.  Abdominal: Soft. She exhibits no distension. There is no tenderness.  Neurological: She is alert and oriented to person, place, and time.  Psychiatric: Affect normal.    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  It is difficult to know if her current antibiotics are making much  difference.  She remains culture + 3 months into treatment.  I suspect that her current antibiotics are preventing her infection from getting worse but this comes with the cost of side effects of her treatments.  I will obtain sputum for repeat culture today and see her back in 1 month.  She agrees with continuing current antibiotics for now.      Relevant  Orders   MYCOBACTERIA, CULTURE, WITH FLUOROCHROME SMEAR       Michel Bickers, MD [facility] for Infectious [facility] [phone] pager   [phone] cell 04/26/2018, 11:19 AM

## 2018-04-27 DIAGNOSIS — M13841 Other specified arthritis, right hand: Secondary | ICD-10-CM | POA: Diagnosis not present

## 2018-04-27 DIAGNOSIS — Z1231 Encounter for screening mammogram for malignant neoplasm of breast: Secondary | ICD-10-CM | POA: Diagnosis not present

## 2018-04-27 DIAGNOSIS — M65311 Trigger thumb, right thumb: Secondary | ICD-10-CM | POA: Diagnosis not present

## 2018-04-27 DIAGNOSIS — Z01419 Encounter for gynecological examination (general) (routine) without abnormal findings: Secondary | ICD-10-CM | POA: Diagnosis not present

## 2018-04-27 DIAGNOSIS — M654 Radial styloid tenosynovitis [de Quervain]: Secondary | ICD-10-CM | POA: Diagnosis not present

## 2018-04-27 DIAGNOSIS — Z6824 Body mass index (BMI) 24.0-24.9, adult: Secondary | ICD-10-CM | POA: Diagnosis not present

## 2018-04-28 ENCOUNTER — Telehealth: Payer: Self-pay | Admitting: *Deleted

## 2018-04-28 ENCOUNTER — Telehealth: Payer: Self-pay | Admitting: Pulmonary Disease

## 2018-04-28 ENCOUNTER — Ambulatory Visit: Payer: Medicare HMO | Admitting: Internal Medicine

## 2018-04-28 NOTE — Telephone Encounter (Signed)
Patient called stating that she is being scheduled for surgery some time next week and she wants to know if Dr Megan Salon thinks it is ok for her to be put to sleep since she has MAC.   After discussing with Dr Megan Salon he advised to let her know that from a MAC standpoint she is fine to have surgery but he would like for her to get in touch with Dr Celso Sickle office as they have a better idea of lung functionality.   She advised she will call his office today.

## 2018-04-28 NOTE — Telephone Encounter (Signed)
Called and spoke to patient. Patient reports that she is going to be having hand surgery next week. She wants to check with MD to see if it will be safe for her to be under general anesthesia.   BQ please advise.

## 2018-04-28 NOTE — Telephone Encounter (Signed)
OK to proceed

## 2018-04-29 ENCOUNTER — Telehealth: Payer: Self-pay | Admitting: Infectious Diseases

## 2018-04-29 NOTE — Telephone Encounter (Signed)
Called and spoke to patient. Let her know that BQ stated it is okay to proceed. Patient thanked staff for calling her back and letting her know. Nothing further is needed at this time.

## 2018-04-29 NOTE — Telephone Encounter (Signed)
1+ AFB seen on her smear.  Cx pending

## 2018-05-06 DIAGNOSIS — M65311 Trigger thumb, right thumb: Secondary | ICD-10-CM | POA: Diagnosis not present

## 2018-05-06 DIAGNOSIS — M654 Radial styloid tenosynovitis [de Quervain]: Secondary | ICD-10-CM | POA: Diagnosis not present

## 2018-05-06 DIAGNOSIS — M65131 Other infective (teno)synovitis, right wrist: Secondary | ICD-10-CM | POA: Diagnosis not present

## 2018-05-06 IMAGING — CR DG CHEST 2V
2 series · 2 of 2 positions shown · non-contrast
Comparison: Radiographs October 14, 2016.

CLINICAL DATA: Hemoptysis, cough.

EXAM:
CHEST  2 VIEW

[w chest pa *]
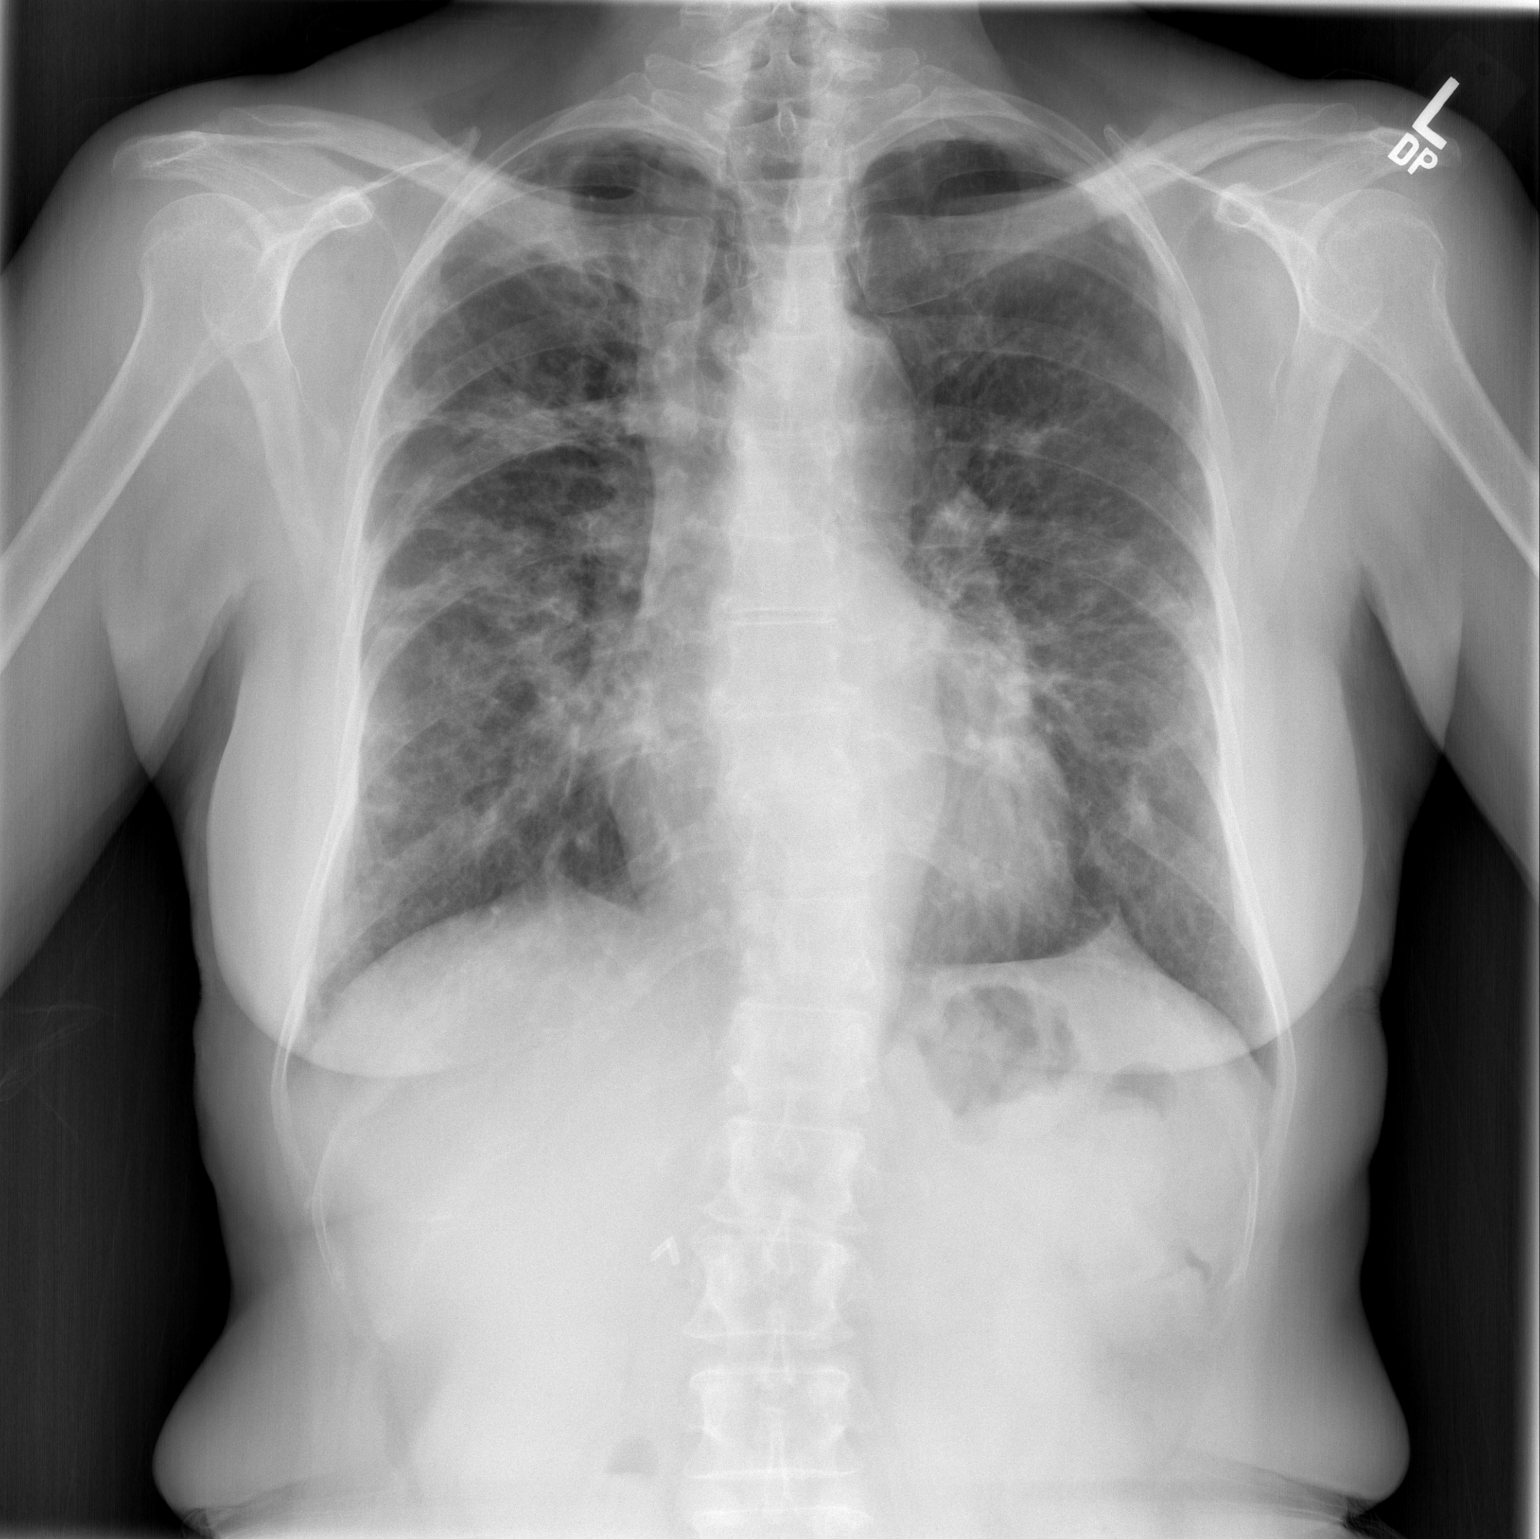

[w chest lat *]
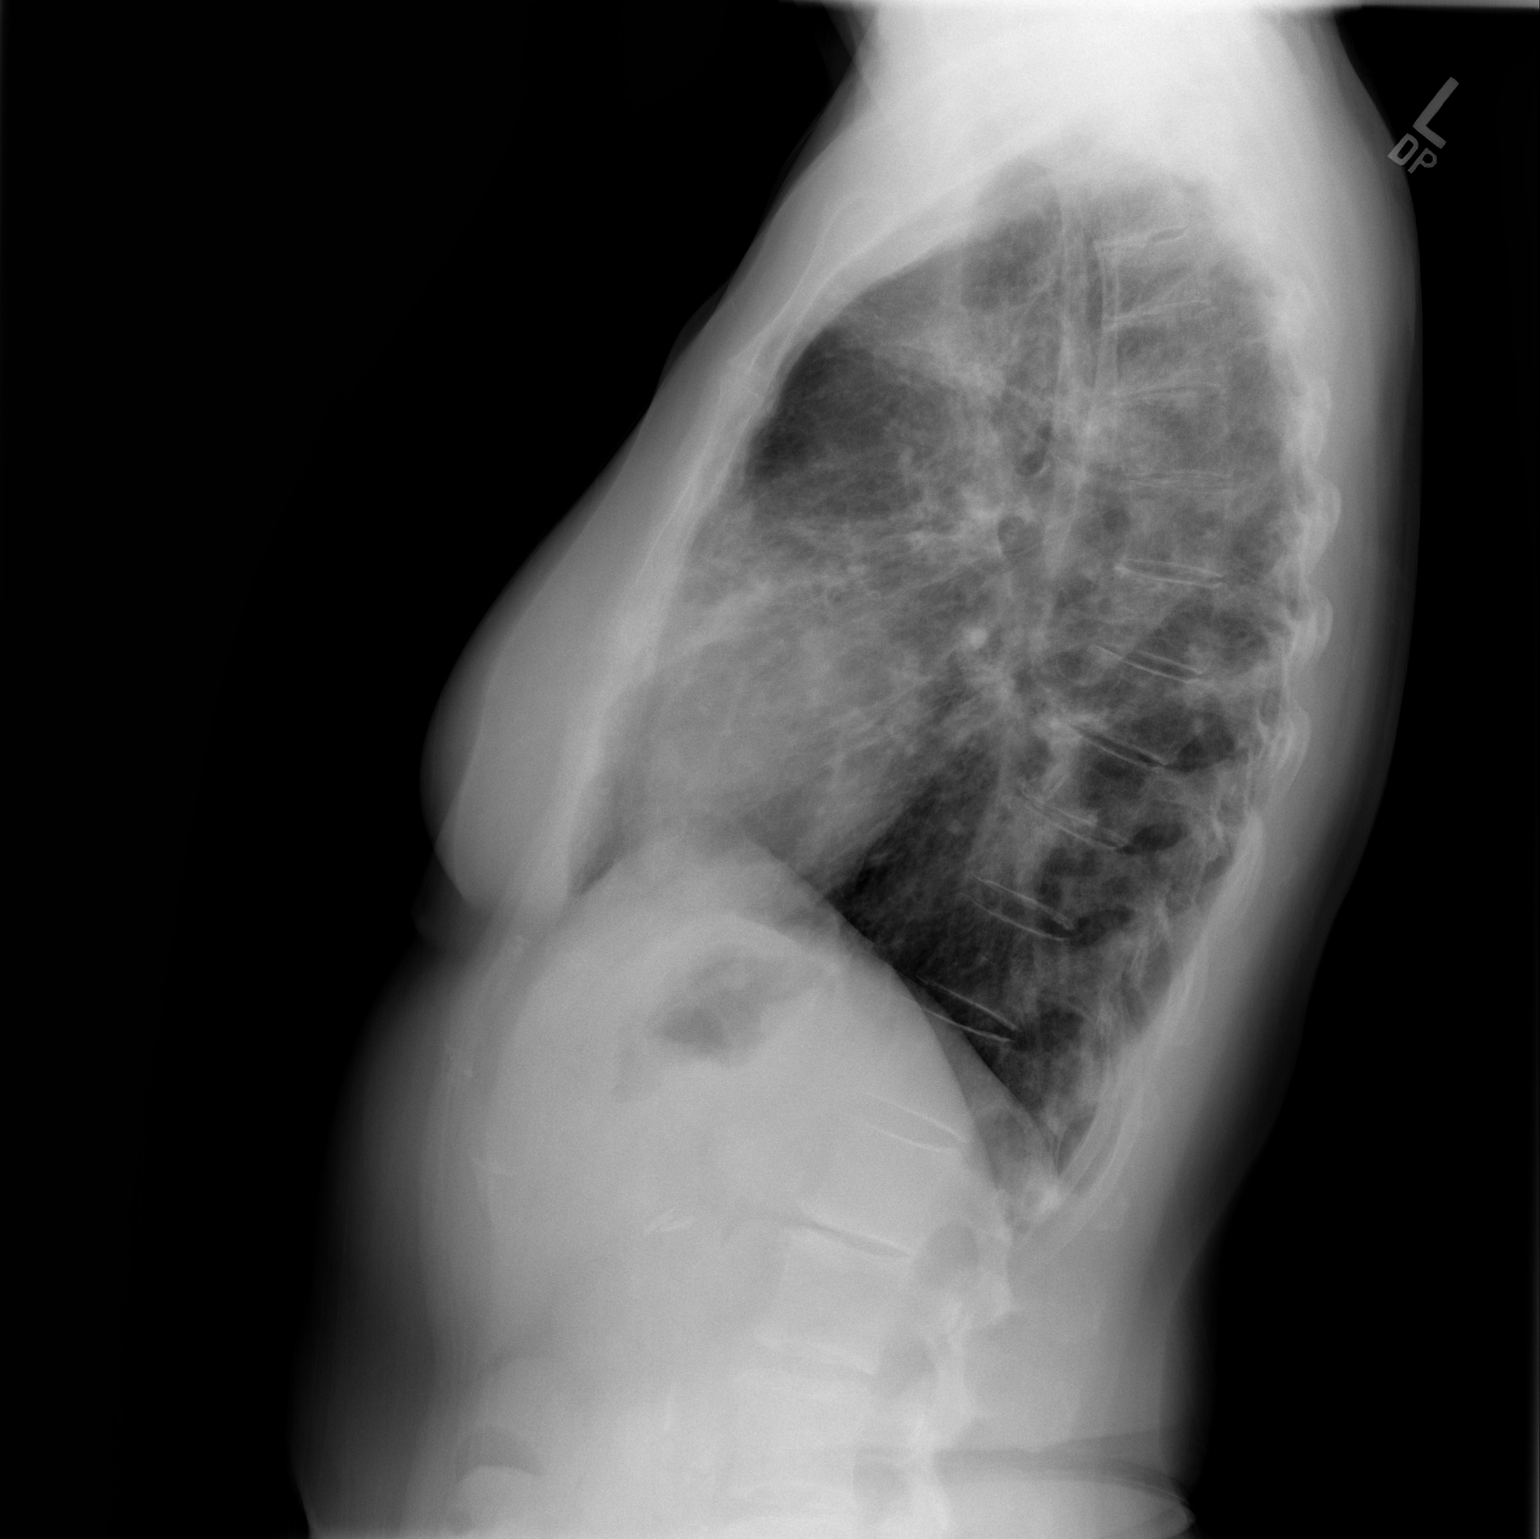

[2 of 2 positions shown; findings below may reference images not displayed]

FINDINGS: The heart size and mediastinal contours are within normal limits. No
pneumothorax or pleural effusion is noted. Diffuse interstitial
densities are noted throughout both lungs consistent with scarring,
right greater than left. No pneumothorax or pleural effusion is
noted. The visualized skeletal structures are unremarkable.
IMPRESSION: Diffuse interstitial densities are noted throughout both lungs
consistent with scarring, right greater than left. Superimposed
acute inflammation cannot be excluded.

## 2018-05-10 ENCOUNTER — Ambulatory Visit: Payer: Medicare HMO | Admitting: Internal Medicine

## 2018-05-11 LAB — MYCOBACTERIA,CULT W/FLUOROCHROME SMEAR
MICRO NUMBER: 90460562
SPECIMEN QUALITY:: ADEQUATE

## 2018-05-13 DIAGNOSIS — M13841 Other specified arthritis, right hand: Secondary | ICD-10-CM | POA: Diagnosis not present

## 2018-05-13 DIAGNOSIS — Z4789 Encounter for other orthopedic aftercare: Secondary | ICD-10-CM | POA: Diagnosis not present

## 2018-05-13 DIAGNOSIS — M65311 Trigger thumb, right thumb: Secondary | ICD-10-CM | POA: Diagnosis not present

## 2018-05-20 DIAGNOSIS — M65311 Trigger thumb, right thumb: Secondary | ICD-10-CM | POA: Diagnosis not present

## 2018-05-20 DIAGNOSIS — Z4789 Encounter for other orthopedic aftercare: Secondary | ICD-10-CM | POA: Diagnosis not present

## 2018-05-20 DIAGNOSIS — M25641 Stiffness of right hand, not elsewhere classified: Secondary | ICD-10-CM | POA: Diagnosis not present

## 2018-05-20 DIAGNOSIS — M65831 Other synovitis and tenosynovitis, right forearm: Secondary | ICD-10-CM | POA: Diagnosis not present

## 2018-05-23 DIAGNOSIS — M25641 Stiffness of right hand, not elsewhere classified: Secondary | ICD-10-CM | POA: Diagnosis not present

## 2018-05-26 DIAGNOSIS — M25641 Stiffness of right hand, not elsewhere classified: Secondary | ICD-10-CM | POA: Diagnosis not present

## 2018-05-28 ENCOUNTER — Other Ambulatory Visit: Payer: Self-pay | Admitting: Pulmonary Disease

## 2018-05-31 ENCOUNTER — Ambulatory Visit (INDEPENDENT_AMBULATORY_CARE_PROVIDER_SITE_OTHER): Payer: Medicare HMO | Admitting: Internal Medicine

## 2018-05-31 ENCOUNTER — Encounter: Payer: Self-pay | Admitting: Internal Medicine

## 2018-05-31 DIAGNOSIS — M25641 Stiffness of right hand, not elsewhere classified: Secondary | ICD-10-CM | POA: Diagnosis not present

## 2018-05-31 DIAGNOSIS — A310 Pulmonary mycobacterial infection: Secondary | ICD-10-CM

## 2018-05-31 NOTE — Progress Notes (Signed)
[facility] for Infectious Disease  Patient Active Problem List   Diagnosis Date Noted  . 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 BRONCHIECTASIS 05/23/2009    Priority: Medium  . Dysphonia 01/11/2018  . Diarrhea 01/11/2018  . Right shoulder pain 01/21/2017  . Encounter for hepatitis C screening test for low risk patient 01/21/2017  . GERD (gastroesophageal reflux disease) 02/21/2016  . 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 Dyslipidemia 11/14/2014  . Allergic rhinitis 05/03/2009    Patient's Medications  New Prescriptions   No medications on file  Previous Medications   ACYCLOVIR (ZOVIRAX) 400 MG TABLET    Take 1 tablet (400 mg total) by mouth 5 (five) times daily.   ALBUTEROL (PROVENTIL HFA;VENTOLIN HFA) 108 (90 BASE) MCG/ACT INHALER    Inhale 2 puffs into the lungs every 6 (six) hours as needed for wheezing or shortness of breath.   ALBUTEROL (PROVENTIL HFA;VENTOLIN HFA) 108 (90 BASE) MCG/ACT INHALER    Inhale 2 puffs into the lungs every 6 (six) hours as needed for wheezing or shortness of breath.   AMIKACIN SULFATE LIPOSOME (ARIKAYCE) 590 MG/8.4ML SUSP    Inhale 590 mg into the lungs daily.   ATORVASTATIN (LIPITOR) 20 MG TABLET    Take 20 mg by mouth daily.   AZITHROMYCIN (ZITHROMAX) 250 MG TABLET    Take 1 tablet (250 mg total) by mouth daily.   B COMPLEX VITAMINS (B COMPLEX-B12 PO)    Take 1 tablet by mouth daily.   BIOTIN 5000 PO    Take 2 capsules by mouth daily.   CALCIUM CARBONATE-VITAMIN D (CALCIUM-VITAMIN D3) 600-200 MG-UNIT TABS    Take 1 tablet by mouth 2 (two) times daily.     CHOLECALCIFEROL (VITAMIN D3) 1000 UNITS CAPS    Take 2,000 Units by mouth daily.   DOCUSATE CALCIUM (STOOL SOFTENER PO)    Take 100 mg by mouth 2 (two) times daily.    ETHAMBUTOL (MYAMBUTOL) 400 MG TABLET    Take 3 tablets (1,200 mg total) by mouth daily.   FLAXSEED, LINSEED, (FLAX SEED OIL) 1300 MG CAPS    Take by mouth daily.   MINOCYCLINE  (DYNACIN) 100 MG TABLET    Take 100 mg by mouth 1 day or 1 dose.   OMEPRAZOLE (PRILOSEC) 40 MG CAPSULE    TAKE 1 CAPSULE TWICE DAILY   PROBIOTIC PRODUCT (PROBIOTIC DAILY PO)    Take 1 capsule by mouth daily.   RESPIRATORY THERAPY SUPPLIES (FLUTTER) DEVI    Use as directed.   RIFAMPIN (RIFADIN) 300 MG CAPSULE    Take 2 capsules (600 mg total) by mouth daily.   SPACER/AERO-HOLDING CHAMBERS (AEROCHAMBER MV) INHALER    Use as instructed   SPACER/AERO-HOLDING CHAMBERS (AEROCHAMBER MV) INHALER    Use as instructed   TURMERIC PO    Take 2,000 mg by mouth daily.    VITAMIN B-12 (CYANOCOBALAMIN) 1000 MCG TABLET    Take 2,000 mcg by mouth daily.  Modified Medications   No medications on file  Discontinued Medications   No medications on file    Subjective: Kiara Medina is in for her routine follow-up visit.  She has relapsed, resistant Mycobacterium avium pneumonia.  She has been back on daily azithromycin, ethambutol and rifampin since early January.  She started aerosolized amikacin in mid January.  She feels like her cough may be a little worse.  She has had no more episodes of hemoptysis.  Her shortness  of breath has not been as noticeable but she feels this may be due to being much more sedentary.  She will be entering full retirement soon.  She has had no changes in her hearing noted on monthly audiograms.  She has had no change in her vision.  Her renal function remains normal.  Review of Systems: Review of Systems  Constitutional: Positive for malaise/fatigue. Negative for chills, diaphoresis, fever and weight loss.  HENT: Positive for tinnitus. Negative for congestion, hearing loss and sore throat.        She has had no change in her hearing or chronic tinnitus.  Eyes:       No change in vision.  She has a cataract but is uncertain when she would be willing to have surgery.    Respiratory: Positive for cough, sputum production and shortness of breath. Negative for hemoptysis and wheezing.     Cardiovascular: Negative for chest pain.  Gastrointestinal: Positive for nausea. Negative for abdominal pain, diarrhea, heartburn and vomiting.       Frequent soft bowel movements.  Musculoskeletal: Positive for joint pain.  Skin: Negative for rash.  Neurological: Negative for dizziness and headaches.    Past Medical History:  Diagnosis Date  . Allergic rhinitis, cause unspecified   . Bronchiectasis without acute exacerbation ([place])   . Cough   . Sinusitis     Social History   Tobacco Use  . Smoking status: Former Smoker    Packs/day: 1.00    Years: 20.00    Pack years: 20.00    Types: Cigarettes    Last attempt to quit: 12/14/1985    Years since quitting: 32.4  . Smokeless tobacco: Never Used  Substance Use Topics  . Alcohol use: Yes    Alcohol/week: 0.6 oz    Types: 1 Glasses of wine per week    Comment: occ  . Drug use: No    Family History  Problem Relation Age of Onset  . Emphysema Sister   . Stroke Mother   . Clotting disorder Mother     No Known Allergies  Objective: Vitals:   05/31/18 1059  BP: 125/78  Pulse: 83  Temp: 97.8 F (36.6 C)  TempSrc: Oral  Weight: 134 lb (60.8 kg)   Body mass index is 23.74 kg/m.  Physical Exam  Constitutional: She is oriented to person, place, and time.  She is in good spirits.  Cardiovascular: Normal rate and regular rhythm.  No murmur heard. Pulmonary/Chest: Effort normal. She has no wheezes. She has rales.  Some crackles in her left lung posteriorly.  Abdominal: Soft. She exhibits no distension. There is no tenderness.  Neurological: She is alert and oriented to person, place, and time.  Psychiatric: Affect normal.    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  I am not sure if there are any changes can be made to improve her regimen but I will review her last antibiotic susceptibility profile again before her visit in 1 month.  She elects to continue current therapy for now.      Relevant Orders   CBC   Comprehensive metabolic panel       Michel Bickers, MD [facility] [phone] pager   [phone] cell 05/31/2018, 11:28 AM

## 2018-05-31 NOTE — Assessment & Plan Note (Signed)
She has had no significant improvement on 4 drug therapy for her multidrug-resistant, relapsed Mycobacterium avium pneumonia.  I am not sure if there are any changes can be made to improve her regimen but I will review her last antibiotic susceptibility profile again before her visit in 1 month.  She elects to continue current therapy for now.

## 2018-06-01 LAB — CBC
HEMATOCRIT: 46 % — AB (ref 35.0–45.0)
HEMOGLOBIN: 15.6 g/dL — AB (ref 11.7–15.5)
MCH: 31.5 pg (ref 27.0–33.0)
MCHC: 33.9 g/dL (ref 32.0–36.0)
MCV: 92.7 fL (ref 80.0–100.0)
MPV: 11.4 fL (ref 7.5–12.5)
Platelets: 172 10*3/uL (ref 140–400)
RBC: 4.96 10*6/uL (ref 3.80–5.10)
RDW: 12.7 % (ref 11.0–15.0)
WBC: 6.4 10*3/uL (ref 3.8–10.8)

## 2018-06-01 LAB — COMPREHENSIVE METABOLIC PANEL
AG Ratio: 1.6 (calc) (ref 1.0–2.5)
ALBUMIN MSPROF: 4.1 g/dL (ref 3.6–5.1)
ALT: 15 U/L (ref 6–29)
AST: 23 U/L (ref 10–35)
Alkaline phosphatase (APISO): 72 U/L (ref 33–130)
BUN: 15 mg/dL (ref 7–25)
CALCIUM: 9.6 mg/dL (ref 8.6–10.4)
CO2: 24 mmol/L (ref 20–32)
Chloride: 106 mmol/L (ref 98–110)
Creat: 0.68 mg/dL (ref 0.60–0.93)
Globulin: 2.6 g/dL (calc) (ref 1.9–3.7)
Glucose, Bld: 95 mg/dL (ref 65–99)
Potassium: 4.4 mmol/L (ref 3.5–5.3)
Sodium: 141 mmol/L (ref 135–146)
Total Bilirubin: 0.7 mg/dL (ref 0.2–1.2)
Total Protein: 6.7 g/dL (ref 6.1–8.1)

## 2018-06-01 LAB — SPECIMEN COMPROMISED

## 2018-06-07 LAB — MYCOBACTERIA,CULT W/FLUOROCHROME SMEAR
MICRO NUMBER:: 90585878
SPECIMEN QUALITY:: ADEQUATE

## 2018-07-04 DIAGNOSIS — A310 Pulmonary mycobacterial infection: Secondary | ICD-10-CM | POA: Diagnosis not present

## 2018-07-04 DIAGNOSIS — K219 Gastro-esophageal reflux disease without esophagitis: Secondary | ICD-10-CM | POA: Diagnosis not present

## 2018-07-04 DIAGNOSIS — E559 Vitamin D deficiency, unspecified: Secondary | ICD-10-CM | POA: Diagnosis not present

## 2018-07-04 DIAGNOSIS — E785 Hyperlipidemia, unspecified: Secondary | ICD-10-CM | POA: Diagnosis not present

## 2018-07-04 DIAGNOSIS — H903 Sensorineural hearing loss, bilateral: Secondary | ICD-10-CM | POA: Diagnosis not present

## 2018-07-04 DIAGNOSIS — J479 Bronchiectasis, uncomplicated: Secondary | ICD-10-CM | POA: Diagnosis not present

## 2018-07-04 DIAGNOSIS — Z822 Family history of deafness and hearing loss: Secondary | ICD-10-CM | POA: Diagnosis not present

## 2018-07-04 DIAGNOSIS — R7301 Impaired fasting glucose: Secondary | ICD-10-CM | POA: Diagnosis not present

## 2018-07-04 DIAGNOSIS — M79641 Pain in right hand: Secondary | ICD-10-CM | POA: Diagnosis not present

## 2018-07-04 DIAGNOSIS — M064 Inflammatory polyarthropathy: Secondary | ICD-10-CM | POA: Diagnosis not present

## 2018-07-04 DIAGNOSIS — H9313 Tinnitus, bilateral: Secondary | ICD-10-CM | POA: Diagnosis not present

## 2018-07-11 ENCOUNTER — Encounter: Payer: Self-pay | Admitting: Internal Medicine

## 2018-07-11 ENCOUNTER — Ambulatory Visit (INDEPENDENT_AMBULATORY_CARE_PROVIDER_SITE_OTHER): Payer: Medicare HMO | Admitting: Internal Medicine

## 2018-07-11 DIAGNOSIS — A310 Pulmonary mycobacterial infection: Secondary | ICD-10-CM

## 2018-07-11 NOTE — Assessment & Plan Note (Signed)
She is tolerating her 4 drug salvage regimen reasonably well but remains sputum AFB culture +6 months into therapy.  I told her again that is extremely unlikely that we could ever cure this infection because of the multidrug resistance.  It is impossible to know if her current regimen is making any positive difference.  It may be that she would actually be doing much worse if she were not on her medications.  The only way to know would be to stop and see what happens.  She prefers to continue them for now.  We will check another sputum culture before her visit next month.  I will consider repeating her chest x-ray next month.

## 2018-07-11 NOTE — Progress Notes (Signed)
[facility] for Infectious Disease  Patient Active Problem List   Diagnosis Date Noted  . 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 BRONCHIECTASIS 05/23/2009    Priority: Medium  . Dysphonia 01/11/2018  . Diarrhea 01/11/2018  . Right shoulder pain 01/21/2017  . Encounter for hepatitis C screening test for low risk patient 01/21/2017  . GERD (gastroesophageal reflux disease) 02/21/2016  . 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 Dyslipidemia 11/14/2014  . Allergic rhinitis 05/03/2009    Patient's Medications  New Prescriptions   No medications on file  Previous Medications   ACYCLOVIR (ZOVIRAX) 400 MG TABLET    Take 1 tablet (400 mg total) by mouth 5 (five) times daily.   ALBUTEROL (PROVENTIL HFA;VENTOLIN HFA) 108 (90 BASE) MCG/ACT INHALER    Inhale 2 puffs into the lungs every 6 (six) hours as needed for wheezing or shortness of breath.   ALBUTEROL (PROVENTIL HFA;VENTOLIN HFA) 108 (90 BASE) MCG/ACT INHALER    Inhale 2 puffs into the lungs every 6 (six) hours as needed for wheezing or shortness of breath.   AMIKACIN SULFATE LIPOSOME (ARIKAYCE) 590 MG/8.4ML SUSP    Inhale 590 mg into the lungs daily.   ATORVASTATIN (LIPITOR) 20 MG TABLET    Take 20 mg by mouth daily.   AZITHROMYCIN (ZITHROMAX) 250 MG TABLET    Take 1 tablet (250 mg total) by mouth daily.   B COMPLEX VITAMINS (B COMPLEX-B12 PO)    Take 1 tablet by mouth daily.   BIOTIN 5000 PO    Take 2 capsules by mouth daily.   CALCIUM CARBONATE-VITAMIN D (CALCIUM-VITAMIN D3) 600-200 MG-UNIT TABS    Take 1 tablet by mouth 2 (two) times daily.     CHOLECALCIFEROL (VITAMIN D3) 1000 UNITS CAPS    Take 2,000 Units by mouth daily.   DOCUSATE CALCIUM (STOOL SOFTENER PO)    Take 100 mg by mouth 2 (two) times daily.    ETHAMBUTOL (MYAMBUTOL) 400 MG TABLET    Take 3 tablets (1,200 mg total) by mouth daily.   FLAXSEED, LINSEED, (FLAX SEED OIL) 1300 MG CAPS    Take by mouth daily.   MINOCYCLINE  (DYNACIN) 100 MG TABLET    Take 100 mg by mouth 1 day or 1 dose.   OMEPRAZOLE (PRILOSEC) 40 MG CAPSULE    TAKE 1 CAPSULE TWICE DAILY   PROBIOTIC PRODUCT (PROBIOTIC DAILY PO)    Take 1 capsule by mouth daily.   RESPIRATORY THERAPY SUPPLIES (FLUTTER) DEVI    Use as directed.   RIFAMPIN (RIFADIN) 300 MG CAPSULE    Take 2 capsules (600 mg total) by mouth daily.   SPACER/AERO-HOLDING CHAMBERS (AEROCHAMBER MV) INHALER    Use as instructed   SPACER/AERO-HOLDING CHAMBERS (AEROCHAMBER MV) INHALER    Use as instructed   TURMERIC PO    Take 2,000 mg by mouth daily.    VITAMIN B-12 (CYANOCOBALAMIN) 1000 MCG TABLET    Take 2,000 mcg by mouth daily.  Modified Medications   No medications on file  Discontinued Medications   No medications on file    Subjective: Kiara Medina is in for her routine follow-up visit.  She has relapsed, resistant Mycobacterium avium pneumonia.  She has been back on daily azithromycin, ethambutol and rifampin since early January.  She started aerosolized amikacin in mid January.  Her cough is unchanged.  She has had no more episodes of hemoptysis.  She believes that her dyspnea on exertion and  fatigue may be a little bit worse.  She has had no changes in her hearing noted on monthly audiograms.  She has had no change in her vision.  Her renal function remains normal.  She is not having as much trouble tolerating her aerosolized amikacin.  Her hoarseness has resolved.  Review of Systems: Review of Systems  Constitutional: Positive for malaise/fatigue. Negative for chills, diaphoresis, fever and weight loss.  HENT: Positive for tinnitus. Negative for congestion, hearing loss and sore throat.        She has had no change in her hearing or chronic tinnitus.  Eyes:       No change in vision.  She has a cataract but is uncertain when she would be willing to have surgery.    Respiratory: Positive for cough, sputum production, shortness of breath and wheezing. Negative for hemoptysis.     Cardiovascular: Negative for chest pain.  Gastrointestinal: Positive for nausea. Negative for abdominal pain, diarrhea, heartburn and vomiting.       Her diarrhea has improved.  Musculoskeletal: Positive for joint pain.  Skin: Negative for rash.  Neurological: Negative for dizziness and headaches.    Past Medical History:  Diagnosis Date  . Allergic rhinitis, cause unspecified   . Bronchiectasis without acute exacerbation ([place])   . Cough   . Sinusitis     Social History   Tobacco Use  . Smoking status: Former Smoker    Packs/day: 1.00    Years: 20.00    Pack years: 20.00    Types: Cigarettes    Last attempt to quit: 12/14/1985    Years since quitting: 32.5  . Smokeless tobacco: Never Used  Substance Use Topics  . Alcohol use: Yes    Alcohol/week: 0.6 oz    Types: 1 Glasses of wine per week    Comment: occ  . Drug use: No    Family History  Problem Relation Age of Onset  . Emphysema Sister   . Stroke Mother   . Clotting disorder Mother     No Known Allergies  Objective: Vitals:   07/11/18 1559  BP: 136/79  Pulse: 67  Temp: 97.6 F (36.4 C)  TempSrc: Oral  Weight: 132 lb (59.9 kg)   Body mass index is 23.38 kg/m.  Physical Exam  Constitutional: She is oriented to person, place, and time.  She is in good spirits.  Cardiovascular: Normal rate and regular rhythm.  No murmur heard. Pulmonary/Chest: Effort normal. She has no wheezes. She has no rales.  She has expiratory rhonchi in all lung fields.  Abdominal: Soft. She exhibits no distension. There is no tenderness.  Neurological: She is alert and oriented to person, place, and time.  Skin: No rash noted.  Psychiatric: Affect normal.    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  I told her again that is extremely unlikely that we could ever cure this infection because of the multidrug resistance.  It is impossible to know if her current regimen is making any positive difference.  It may be that she would actually be doing much worse if she were not on her medications.  The only way to know would be to stop and see  what happens.  She prefers to continue them for now.  We will check another sputum culture before her visit next month.  I will consider repeating her chest x-ray next month.      Relevant Orders   MYCOBACTERIA, CULTURE, WITH FLUOROCHROME SMEAR       Michel Bickers, MD [facility] [place] [facility] [phone] pager   [phone] cell 07/11/2018, 4:26 PM

## 2018-07-20 DIAGNOSIS — M79641 Pain in right hand: Secondary | ICD-10-CM | POA: Diagnosis not present

## 2018-07-20 DIAGNOSIS — M19042 Primary osteoarthritis, left hand: Secondary | ICD-10-CM | POA: Diagnosis not present

## 2018-07-20 DIAGNOSIS — M19041 Primary osteoarthritis, right hand: Secondary | ICD-10-CM | POA: Diagnosis not present

## 2018-07-20 DIAGNOSIS — M79642 Pain in left hand: Secondary | ICD-10-CM | POA: Diagnosis not present

## 2018-07-22 LAB — M. AVIUM MIC PANEL
CIPROFLOXACIN: 16
ETHAMBUTOL: >16
ETHIONAMIDE: 1.2
ISONIAZID: 4
LINEZOLID: 32
RIFABUTIN: 1
RIFAMPIN: >8
STREPTOMYCIN: 32

## 2018-07-22 LAB — MYCOBACTERIA,CULT W/FLUOROCHROME SMEAR
MICRO NUMBER: 90728194
SMEAR:: NONE SEEN
SPECIMEN QUALITY:: ADEQUATE

## 2018-08-04 DIAGNOSIS — M810 Age-related osteoporosis without current pathological fracture: Secondary | ICD-10-CM | POA: Diagnosis not present

## 2018-08-19 ENCOUNTER — Telehealth: Payer: Self-pay | Admitting: Internal Medicine

## 2018-08-19 NOTE — Telephone Encounter (Signed)
This is the response to my consult request from [facility].

## 2018-08-22 ENCOUNTER — Ambulatory Visit (INDEPENDENT_AMBULATORY_CARE_PROVIDER_SITE_OTHER): Payer: Medicare HMO | Admitting: Internal Medicine

## 2018-08-22 VITALS — BP 106/72 | HR 91 | Temp 97.6°F

## 2018-08-22 DIAGNOSIS — Z23 Encounter for immunization: Secondary | ICD-10-CM | POA: Diagnosis not present

## 2018-08-22 DIAGNOSIS — A310 Pulmonary mycobacterial infection: Secondary | ICD-10-CM | POA: Diagnosis not present

## 2018-08-22 MED ORDER — CLOFAZIMINE POWD: 100.0000 mg | Freq: Every day | Status: DC

## 2018-08-22 NOTE — Assessment & Plan Note (Signed)
Kiara Medina has macrolide resistant, relapsed Mycobacterium avium pneumonia and has had a very poor response to her current 4 drug regimen.  I reviewed her current situation with [facility].  They recommended changing azithromycin to clofazimine.  I also suggested checking drug levels although I am not sure these are available locally.  They mentioned surgery but [name]'s disease is very diffuse and not amenable to surgical resection.  I will change her to clofazimine and have her follow-up in 6 weeks.

## 2018-08-22 NOTE — Progress Notes (Signed)
[facility] for Infectious Disease  Patient Active Problem List   Diagnosis Date Noted  . Mycobacterium avium-intracellulare complex (Kiara Medina) 10/29/2014    Priority: High  . BRONCHIECTASIS 05/23/2009    Priority: Medium  . Dysphonia 01/11/2018  . Diarrhea 01/11/2018  . Right shoulder pain 01/21/2017  . Encounter for hepatitis C screening test for low risk patient 01/21/2017  . GERD (gastroesophageal reflux disease) 02/21/2016  . 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 Dyslipidemia 11/14/2014  . Allergic rhinitis 05/03/2009    Patient's Medications  New Prescriptions   CLOFAZIMINE POWD    100 mg by Does not apply route daily.  Previous Medications   ACYCLOVIR (ZOVIRAX) 400 MG TABLET    Take 1 tablet (400 mg total) by mouth 5 (five) times daily.   ALBUTEROL (PROVENTIL HFA;VENTOLIN HFA) 108 (90 BASE) MCG/ACT INHALER    Inhale 2 puffs into the lungs every 6 (six) hours as needed for wheezing or shortness of breath.   AMIKACIN SULFATE LIPOSOME (ARIKAYCE) 590 MG/8.4ML SUSP    Inhale 590 mg into the lungs daily.   ATORVASTATIN (LIPITOR) 20 MG TABLET    Take 20 mg by mouth daily.   B COMPLEX VITAMINS (B COMPLEX-B12 PO)    Take 1 tablet by mouth daily.   BIOTIN 5000 PO    Take 2 capsules by mouth daily.   CALCIUM CARBONATE-VITAMIN D (CALCIUM-VITAMIN D3) 600-200 MG-UNIT TABS    Take 1 tablet by mouth 2 (two) times daily.     CHOLECALCIFEROL (VITAMIN D3) 1000 UNITS CAPS    Take 2,000 Units by mouth daily.   DOCUSATE CALCIUM (STOOL SOFTENER PO)    Take 100 mg by mouth 2 (two) times daily.    ETHAMBUTOL (MYAMBUTOL) 400 MG TABLET    Take 3 tablets (1,200 mg total) by mouth daily.   FLAXSEED, LINSEED, (FLAX SEED OIL) 1300 MG CAPS    Take by mouth daily.   MINOCYCLINE (DYNACIN) 100 MG TABLET    Take 100 mg by mouth 1 day or 1 dose.   OMEPRAZOLE (PRILOSEC) 40 MG CAPSULE    TAKE 1 CAPSULE TWICE DAILY   PROBIOTIC PRODUCT (PROBIOTIC DAILY PO)    Take 1 capsule by mouth daily.   RESPIRATORY  THERAPY SUPPLIES (FLUTTER) DEVI    Use as directed.   RIFAMPIN (RIFADIN) 300 MG CAPSULE    Take 2 capsules (600 mg total) by mouth daily.   SPACER/AERO-HOLDING CHAMBERS (AEROCHAMBER MV) INHALER    Use as instructed   SPACER/AERO-HOLDING CHAMBERS (AEROCHAMBER MV) INHALER    Use as instructed   TURMERIC PO    Take 2,000 mg by mouth daily.    VITAMIN B-12 (CYANOCOBALAMIN) 1000 MCG TABLET    Take 2,000 mcg by mouth daily.  Modified Medications   No medications on file  Discontinued Medications   ALBUTEROL (PROVENTIL HFA;VENTOLIN HFA) 108 (90 BASE) MCG/ACT INHALER    Inhale 2 puffs into the lungs every 6 (six) hours as needed for wheezing or shortness of breath.   AZITHROMYCIN (ZITHROMAX) 250 MG TABLET    Take 1 tablet (250 mg total) by mouth daily.    Subjective: Kiara Medina is in for her routine follow-up visit.  She says that she is noting more cough, more shortness of breath and more sputum production since her last visit.  She is also noted some wheezing.  She continues to take her azithromycin, ethambutol, rifampin and aerosolized amikacin.  Review of Systems: Review of Systems  Constitutional: Positive  for malaise/fatigue. Negative for chills, diaphoresis, fever and weight loss.  HENT: Negative for sore throat.   Respiratory: Positive for cough, sputum production, shortness of breath and wheezing. Negative for hemoptysis.   Cardiovascular: Negative for chest pain.  Gastrointestinal: Negative for abdominal pain, diarrhea, heartburn, nausea and vomiting.  Genitourinary: Negative for dysuria and frequency.  Musculoskeletal: Positive for joint pain. Negative for myalgias.  Skin: Negative for rash.  Neurological: Negative for dizziness and headaches.    Past Medical History:  Diagnosis Date  . Allergic rhinitis, cause unspecified   . Bronchiectasis without acute exacerbation (Kiara Medina)   . Cough   . Sinusitis     Social History   Tobacco Use  . Smoking status: Former Smoker    Packs/day:  1.00    Years: 20.00    Pack years: 20.00    Types: Cigarettes    Last attempt to quit: 12/14/1985    Years since quitting: 32.7  . Smokeless tobacco: Never Used  Substance Use Topics  . Alcohol use: Yes    Alcohol/week: 1.0 standard drinks    Types: 1 Glasses of wine per week    Comment: occ  . Drug use: No    Family History  Problem Relation Age of Onset  . Emphysema Sister   . Stroke Mother   . Clotting disorder Mother     No Known Allergies  Objective: Vitals:   08/22/18 1118  BP: 106/72  Pulse: 91  Temp: 97.6 F (36.4 C)  TempSrc: Oral   There is no height or weight on file to calculate BMI.  Physical Exam  Constitutional: She is oriented to person, place, and time. No distress.  Cardiovascular: Normal rate, regular rhythm and normal heart sounds.  No murmur heard. Pulmonary/Chest: Effort normal. She has wheezes. She has rales.  Neurological: She is alert and oriented to person, place, and time.  Skin: No rash noted.  Psychiatric: She has a normal mood and affect.    Lab Results    Problem List Items Addressed This Visit      High   Mycobacterium avium-intracellulare complex (Kiara Medina)    Kiara Medina has macrolide resistant, relapsed Mycobacterium avium pneumonia and has had a very poor response to her current 4 drug regimen.  I reviewed her current situation with [facility].  They recommended changing azithromycin to clofazimine.  I also suggested checking drug levels although I am not sure these are available locally.  They mentioned surgery but Kiara Medina's disease is very diffuse and not amenable to surgical resection.  I will change her to clofazimine and have her follow-up in 6 weeks.      Relevant Medications   Clofazimine POWD   Other Relevant Orders   Flu Vaccine QUAD 36+ mos IM (Completed)   MYCOBACTERIA, CULTURE, WITH FLUOROCHROME SMEAR    Other Visit Diagnoses    Need for influenza vaccination    -  Primary   Relevant Orders   Flu Vaccine  QUAD 36+ mos IM (Completed)       Michel Bickers, MD [facility] [place] [facility] [phone] pager   [phone] cell 08/22/2018, 11:56 AM

## 2018-08-23 ENCOUNTER — Telehealth: Payer: Self-pay | Admitting: *Deleted

## 2018-08-23 LAB — MYCOBACTERIA,CULT W/FLUOROCHROME SMEAR
MICRO NUMBER:: 90893343
SPECIMEN QUALITY: ADEQUATE

## 2018-08-23 NOTE — Telephone Encounter (Signed)
Please let Kiara Medina know that the last sputum culture was still positive for Mycobacterium avium.  Also, tell her that the Centers for Disease Control does not recommend 1 influenza vaccine over another.  There are pluses and minuses to each vaccine that is available.  The quadrivalent vaccine covers for different types of influenza while the high-dose only covers 3 types.  I do not recommend that she also get the high-dose vaccine.

## 2018-08-23 NOTE — Telephone Encounter (Signed)
Patient called, stated she forgot to pick up cups for future sputum cultures at her appointment yesterday.  RN placed labeled cups at the front desk for her to pick up at her convenience. She asked about her last sputum culture - it is still pending. She also had questions about the high dose flu shot. She received the quadrivalent influenza vaccine 08/22/18 at [facility], would like to know if she should have received the high dose version instead. Landis Gandy, RN

## 2018-08-24 ENCOUNTER — Telehealth: Payer: Self-pay | Admitting: Pharmacist

## 2018-08-24 NOTE — Telephone Encounter (Signed)
Sent in clofazimine application for patient to [employer].

## 2018-09-05 ENCOUNTER — Ambulatory Visit (INDEPENDENT_AMBULATORY_CARE_PROVIDER_SITE_OTHER): Payer: Medicare HMO | Admitting: Pharmacist

## 2018-09-05 DIAGNOSIS — H903 Sensorineural hearing loss, bilateral: Secondary | ICD-10-CM | POA: Diagnosis not present

## 2018-09-05 DIAGNOSIS — A310 Pulmonary mycobacterial infection: Secondary | ICD-10-CM | POA: Diagnosis not present

## 2018-09-05 DIAGNOSIS — Z822 Family history of deafness and hearing loss: Secondary | ICD-10-CM | POA: Diagnosis not present

## 2018-09-05 DIAGNOSIS — H9313 Tinnitus, bilateral: Secondary | ICD-10-CM | POA: Diagnosis not present

## 2018-09-05 NOTE — Telephone Encounter (Addendum)
Patient is approved for clofazimine from [employer]. Will call her and schedule her to come in and pick up so I can counsel her.

## 2018-09-05 NOTE — Progress Notes (Signed)
HPI: Kiara Medina is a 72 y.o. female who presents the [facility] today to pick up her first fill of clofazamine for her MDR MAC.  Patient Active Problem List   Diagnosis Date Noted  . Dysphonia 01/11/2018  . Diarrhea 01/11/2018  . Right shoulder pain 01/21/2017  . Encounter for hepatitis C screening test for low risk patient 01/21/2017  . GERD (gastroesophageal reflux disease) 02/21/2016  . 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 Dyslipidemia 11/14/2014  . 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 BRONCHIECTASIS 05/23/2009  . Allergic rhinitis 05/03/2009    Patient's Medications  New Prescriptions   No medications on file  Previous Medications   ACYCLOVIR (ZOVIRAX) 400 MG TABLET    Take 1 tablet (400 mg total) by mouth 5 (five) times daily.   ALBUTEROL (PROVENTIL HFA;VENTOLIN HFA) 108 (90 BASE) MCG/ACT INHALER    Inhale 2 puffs into the lungs every 6 (six) hours as needed for wheezing or shortness of breath.   AMIKACIN SULFATE LIPOSOME (ARIKAYCE) 590 MG/8.4ML SUSP    Inhale 590 mg into the lungs daily.   ATORVASTATIN (LIPITOR) 20 MG TABLET    Take 20 mg by mouth daily.   B COMPLEX VITAMINS (B COMPLEX-B12 PO)    Take 1 tablet by mouth daily.   BIOTIN 5000 PO    Take 2 capsules by mouth daily.   CALCIUM CARBONATE-VITAMIN D (CALCIUM-VITAMIN D3) 600-200 MG-UNIT TABS    Take 1 tablet by mouth 2 (two) times daily.     CHOLECALCIFEROL (VITAMIN D3) 1000 UNITS CAPS    Take 2,000 Units by mouth daily.   CLOFAZIMINE POWD    100 mg by Does not apply route daily.   DOCUSATE CALCIUM (STOOL SOFTENER PO)    Take 100 mg by mouth 2 (two) times daily.    ETHAMBUTOL (MYAMBUTOL) 400 MG TABLET    Take 3 tablets (1,200 mg total) by mouth daily.   FLAXSEED, LINSEED, (FLAX SEED OIL) 1300 MG CAPS    Take by mouth daily.   MINOCYCLINE (DYNACIN) 100 MG TABLET    Take 100 mg by mouth 1 day or 1 dose.   OMEPRAZOLE (PRILOSEC) 40 MG CAPSULE    TAKE 1 CAPSULE TWICE DAILY   PROBIOTIC PRODUCT  (PROBIOTIC DAILY PO)    Take 1 capsule by mouth daily.   RESPIRATORY THERAPY SUPPLIES (FLUTTER) DEVI    Use as directed.   RIFAMPIN (RIFADIN) 300 MG CAPSULE    Take 2 capsules (600 mg total) by mouth daily.   SPACER/AERO-HOLDING CHAMBERS (AEROCHAMBER MV) INHALER    Use as instructed   SPACER/AERO-HOLDING CHAMBERS (AEROCHAMBER MV) INHALER    Use as instructed   TURMERIC PO    Take 2,000 mg by mouth daily.    VITAMIN B-12 (CYANOCOBALAMIN) 1000 MCG TABLET    Take 2,000 mcg by mouth daily.  Modified Medications   No medications on file  Discontinued Medications   No medications on file    Allergies: No Known Allergies  Past Medical History: Past Medical History:  Diagnosis Date  . Allergic rhinitis, cause unspecified   . Bronchiectasis without acute exacerbation ([place])   . Cough   . Sinusitis     Social History: Social History   Socioeconomic History  . Marital status: Married    Spouse name: Not on file  . Number of children: Not on file  . Years of education: Not on file  . Highest education level: Not on file  Occupational History  . Occupation: Cabin crew  Social Needs  . Financial resource strain: Not on file  . Food insecurity:    Worry: Not on file    Inability: Not on file  . Transportation needs:    Medical: Not on file    Non-medical: Not on file  Tobacco Use  . Smoking status: Former Smoker    Packs/day: 1.00    Years: 20.00    Pack years: 20.00    Types: Cigarettes    Last attempt to quit: 12/14/1985    Years since quitting: 32.7  . Smokeless tobacco: Never Used  Substance and Sexual Activity  . Alcohol use: Yes    Alcohol/week: 1.0 standard drinks    Types: 1 Glasses of wine per week    Comment: occ  . Drug use: No  . Sexual activity: Not on file  Lifestyle  . Physical activity:    Days per week: Not on file    Minutes per session: Not on file  . Stress: Not on file  Relationships  . Social connections:    Talks on phone: Not on file    Gets  together: Not on file    Attends religious service: Not on file    Active member of club or organization: Not on file    Attends meetings of clubs or organizations: Not on file    Relationship status: Not on file  Other Topics Concern  . Not on file  Social History Narrative  . Not on file    Current MAC Regimen: Ethambutol + Rifampin + aerosolized Amikacin  Assessment: Kiara Medina is doing well on her current MAC regimen. 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 She states that since she stopped azithromycin, the diarrhea she was experiencing has subsided, but that her coughing has worsened. Today she was given two bottles of clofazamine 50 mg capsules, each with 100 capsules. She was counseled that she should take 2 capsules daily (100 mg total) and that the most common side effects patients experience is dry skin and just like her rifampin, the clofazamine may cause discoloration of her urine and sweat. We did also inform her that a listed side effect is darkening of the skin, but that we have not had any patients develop this. She was instructed to call us when she was about half-way through her second bottle so we could get her a refill in a timely manner. Kiara Medina verbalized understanding and was hopeful that this treatment would be effective.  Plan: -Start clofazamine 100 mg PO daily -Continue ethambutol + rifampin + amikacin -F/u with Dr. Megan Salon on 10/28 at 2:45pm  Jackson Latino, PharmD PGY1 Pharmacy Resident Phone [phone] 09/05/2018     4:47 PM

## 2018-09-15 ENCOUNTER — Telehealth: Payer: Self-pay | Admitting: Behavioral Health

## 2018-09-15 NOTE — Telephone Encounter (Signed)
Nevin Bloodgood from [employer] [employer] called in Urgent lab results for Kiara Medina   Informed her Dr. Megan Salon would be made aware.  SMEAR: Rare (1 +) acid-fast bacilli seen using the fluorochrome method.Abnormal  P  ISOLATE 1: Mycobacterium, avium-intracellulare group Abnormal  P   [name] RN

## 2018-10-06 LAB — MYCOBACTERIA,CULT W/FLUOROCHROME SMEAR
MICRO NUMBER:: 91080876
SPECIMEN QUALITY: ADEQUATE

## 2018-10-10 ENCOUNTER — Ambulatory Visit: Payer: Medicare HMO | Admitting: Internal Medicine

## 2018-10-10 ENCOUNTER — Encounter: Payer: Self-pay | Admitting: Internal Medicine

## 2018-10-10 ENCOUNTER — Ambulatory Visit
Admission: RE | Admit: 2018-10-10 | Discharge: 2018-10-10 | Disposition: A | Discharge status: Home / Self Care | Payer: Medicare HMO | Source: Ambulatory Visit | Attending: Internal Medicine | Admitting: Internal Medicine

## 2018-10-10 DIAGNOSIS — R05 Cough: Secondary | ICD-10-CM | POA: Diagnosis not present

## 2018-10-10 DIAGNOSIS — H2511 Age-related nuclear cataract, right eye: Secondary | ICD-10-CM | POA: Diagnosis not present

## 2018-10-10 DIAGNOSIS — Z961 Presence of intraocular lens: Secondary | ICD-10-CM | POA: Diagnosis not present

## 2018-10-10 DIAGNOSIS — A310 Pulmonary mycobacterial infection: Secondary | ICD-10-CM

## 2018-10-10 DIAGNOSIS — Z9842 Cataract extraction status, left eye: Secondary | ICD-10-CM | POA: Diagnosis not present

## 2018-10-10 DIAGNOSIS — H11822 Conjunctivochalasis, left eye: Secondary | ICD-10-CM | POA: Diagnosis not present

## 2018-10-10 DIAGNOSIS — H02403 Unspecified ptosis of bilateral eyelids: Secondary | ICD-10-CM | POA: Diagnosis not present

## 2018-10-10 MED ORDER — BENZONATATE 100 MG PO CAPS: 100.0000 mg | ORAL_CAPSULE | Freq: Three times a day (TID) | ORAL | 5 refills | Status: DC | PRN

## 2018-10-10 NOTE — Progress Notes (Signed)
[facility] for Infectious Disease  Patient Active Problem List   Diagnosis Date Noted  . 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 BRONCHIECTASIS 05/23/2009    Priority: Medium  . Dysphonia 01/11/2018  . Diarrhea 01/11/2018  . Right shoulder pain 01/21/2017  . Encounter for hepatitis C screening test for low risk patient 01/21/2017  . GERD (gastroesophageal reflux disease) 02/21/2016  . 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 Dyslipidemia 11/14/2014  . Allergic rhinitis 05/03/2009    Patient's Medications  New Prescriptions   BENZONATATE (TESSALON) 100 MG CAPSULE    Take 1 capsule (100 mg total) by mouth 3 (three) times daily as needed for cough.  Previous Medications   ACYCLOVIR (ZOVIRAX) 400 MG TABLET    Take 1 tablet (400 mg total) by mouth 5 (five) times daily.   ALBUTEROL (PROVENTIL HFA;VENTOLIN HFA) 108 (90 BASE) MCG/ACT INHALER    Inhale 2 puffs into the lungs every 6 (six) hours as needed for wheezing or shortness of breath.   AMIKACIN SULFATE LIPOSOME (ARIKAYCE) 590 MG/8.4ML SUSP    Inhale 590 mg into the lungs daily.   ATORVASTATIN (LIPITOR) 20 MG TABLET    Take 20 mg by mouth daily.   B COMPLEX VITAMINS (B COMPLEX-B12 PO)    Take 1 tablet by mouth daily.   BIOTIN 5000 PO    Take 2 capsules by mouth daily.   CALCIUM CARBONATE-VITAMIN D (CALCIUM-VITAMIN D3) 600-200 MG-UNIT TABS    Take 1 tablet by mouth 2 (two) times daily.     CHOLECALCIFEROL (VITAMIN D3) 1000 UNITS CAPS    Take 2,000 Units by mouth daily.   CLOFAZIMINE POWD    100 mg by Does not apply route daily.   DOCUSATE CALCIUM (STOOL SOFTENER PO)    Take 100 mg by mouth 2 (two) times daily.    ETHAMBUTOL (MYAMBUTOL) 400 MG TABLET    Take 3 tablets (1,200 mg total) by mouth daily.   FLAXSEED, LINSEED, (FLAX SEED OIL) 1300 MG CAPS    Take by mouth daily.   MINOCYCLINE (DYNACIN) 100 MG TABLET    Take 100 mg by mouth 1 day or 1 dose.   OMEPRAZOLE (PRILOSEC) 40 MG CAPSULE     TAKE 1 CAPSULE TWICE DAILY   PROBIOTIC PRODUCT (PROBIOTIC DAILY PO)    Take 1 capsule by mouth daily.   RESPIRATORY THERAPY SUPPLIES (FLUTTER) DEVI    Use as directed.   RIFAMPIN (RIFADIN) 300 MG CAPSULE    Take 2 capsules (600 mg total) by mouth daily.   SPACER/AERO-HOLDING CHAMBERS (AEROCHAMBER MV) INHALER    Use as instructed   SPACER/AERO-HOLDING CHAMBERS (AEROCHAMBER MV) INHALER    Use as instructed   TURMERIC PO    Take 2,000 mg by mouth daily.    VITAMIN B-12 (CYANOCOBALAMIN) 1000 MCG TABLET    Take 2,000 mcg by mouth daily.  Modified Medications   No medications on file  Discontinued Medications   No medications on file    Subjective: Kiara Medina is in for her routine follow-up visit.  She says that she is noting more cough, more shortness of breath and more sputum production since her last visit.  She is also noted some wheezing.  She continues to take her ethambutol, rifampin and aerosolized amikacin.  1 month ago he stopped his azithromycin and started clofazimine.  She has not noted any improvement.  She has a cataract but has not had any other acute  changes in her vision.  She has recently noted some decreased hearing in her left ear.  She is scheduled for her monthly audiogram next week.  Review of Systems: Review of Systems  Constitutional: Positive for malaise/fatigue. Negative for chills, diaphoresis, fever and weight loss.  HENT: Negative for sore throat.   Respiratory: Positive for cough, sputum production, shortness of breath and wheezing. Negative for hemoptysis.   Cardiovascular: Negative for chest pain.  Gastrointestinal: Negative for abdominal pain, diarrhea, heartburn, nausea and vomiting.  Genitourinary: Negative for dysuria and frequency.  Musculoskeletal: Positive for joint pain. Negative for myalgias.  Skin: Negative for rash.  Neurological: Negative for dizziness and headaches.    Past Medical History:  Diagnosis Date  . Allergic rhinitis, cause unspecified     . Bronchiectasis without acute exacerbation ([place])   . Cough   . Sinusitis     Social History   Tobacco Use  . Smoking status: Former Smoker    Packs/day: 1.00    Years: 20.00    Pack years: 20.00    Types: Cigarettes    Last attempt to quit: 12/14/1985    Years since quitting: 32.8  . Smokeless tobacco: Never Used  Substance Use Topics  . Alcohol use: Yes    Alcohol/week: 1.0 standard drinks    Types: 1 Glasses of wine per week    Comment: occ  . Drug use: No    Family History  Problem Relation Age of Onset  . Emphysema Sister   . Stroke Mother   . Clotting disorder Mother     No Known Allergies  Objective: Vitals:   10/10/18 1412  BP: 125/74  Pulse: 77  Temp: 97.6 F (36.4 C)  Weight: 136 lb (61.7 kg)   Body mass index is 24.09 kg/m.  Physical Exam  Constitutional: She is oriented to person, place, and time. No distress.  Cardiovascular: Normal rate, regular rhythm and normal heart sounds.  No murmur heard. Pulmonary/Chest: Effort normal. She has wheezes. She has rales.  Neurological: She is alert and oriented to person, place, and time.  Skin: No rash noted.  Psychiatric: She has a normal mood and affect.    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  I will give her Tessalon Perles to try to suppress her cough and obtain repeat blood work and chest x-ray today.  She will follow-up in 1 month.      Relevant Medications   benzonatate (TESSALON) 100 MG capsule   Other Relevant Orders   CBC   Comprehensive metabolic panel   DG Chest 2 View       Michel Bickers, MD [facility] [phone] pager   [phone] cell 10/10/2018, 2:29 PM

## 2018-10-10 NOTE — Addendum Note (Signed)
Addended by: Michel Bickers on: 10/10/2018 03:35 PM   Modules accepted: Orders

## 2018-10-10 NOTE — Assessment & Plan Note (Signed)
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  I will give her Tessalon Perles to try to suppress her cough and obtain repeat blood work and chest x-ray today.  She will follow-up in 1 month.

## 2018-10-11 LAB — CBC
HEMATOCRIT: 43.5 % (ref 35.0–45.0)
Hemoglobin: 14.8 g/dL (ref 11.7–15.5)
MCH: 31.8 pg (ref 27.0–33.0)
MCHC: 34 g/dL (ref 32.0–36.0)
MCV: 93.3 fL (ref 80.0–100.0)
MPV: 11.7 fL (ref 7.5–12.5)
Platelets: 166 10*3/uL (ref 140–400)
RBC: 4.66 10*6/uL (ref 3.80–5.10)
RDW: 13 % (ref 11.0–15.0)
WBC: 5.1 10*3/uL (ref 3.8–10.8)

## 2018-10-11 LAB — COMPREHENSIVE METABOLIC PANEL
AG RATIO: 1.5 (calc) (ref 1.0–2.5)
ALT: 13 U/L (ref 6–29)
AST: 24 U/L (ref 10–35)
Albumin: 3.8 g/dL (ref 3.6–5.1)
Alkaline phosphatase (APISO): 61 U/L (ref 33–130)
BUN: 13 mg/dL (ref 7–25)
CO2: 31 mmol/L (ref 20–32)
Calcium: 9.4 mg/dL (ref 8.6–10.4)
Chloride: 105 mmol/L (ref 98–110)
Creat: 0.84 mg/dL (ref 0.60–0.93)
GLUCOSE: 121 mg/dL — AB (ref 65–99)
Globulin: 2.5 g/dL (calc) (ref 1.9–3.7)
Potassium: 4 mmol/L (ref 3.5–5.3)
SODIUM: 142 mmol/L (ref 135–146)
Total Bilirubin: 0.6 mg/dL (ref 0.2–1.2)
Total Protein: 6.3 g/dL (ref 6.1–8.1)

## 2018-10-14 ENCOUNTER — Telehealth: Payer: Self-pay | Admitting: Behavioral Health

## 2018-10-14 NOTE — Telephone Encounter (Signed)
[facility] called to give urgent Lab results Mycobacteria, Culture, With Flourochrome smear preliminary result SMEAR: Rare (1 +) acid-fast bacilli seen using the fluorochrome method.Abnormal     [name] RN

## 2018-10-19 ENCOUNTER — Telehealth: Payer: Self-pay | Admitting: Behavioral Health

## 2018-10-19 NOTE — Telephone Encounter (Signed)
Her recent chest x-ray showed:  "Stable chronic reticulonodular opacities with bronchiectasis in the lungs bilaterally, likely related to the clinical history of MAI."  Please let her know that there are no new changes.

## 2018-10-19 NOTE — Telephone Encounter (Signed)
Patient called requesting results from recent chest x-ray.  Patient wanted to know if there were any changes from the previous.  Explained Dr Megan Salon will be made aware and will call her back with results. [name] RN

## 2018-10-19 NOTE — Telephone Encounter (Signed)
Called Kiara Medina, informed her per Dr. Megan Salon that her chest x-ray was unchanged.  [name] verbalized understanding. [name] RN

## 2018-10-27 ENCOUNTER — Telehealth: Payer: Self-pay | Admitting: Pulmonary Disease

## 2018-10-27 NOTE — Telephone Encounter (Signed)
Nothing available on BQ schedule until January 2020.  Advised will have nurse look at his schedule to see if she can be worked in.

## 2018-10-28 NOTE — Telephone Encounter (Signed)
Called and spoke with pt letting her know that BQ has opened up some additional days in December and asked her if we could get her scheduled for an appt with him.  Pt stated that was fine. Pt has been scheduled for an OV with BQ Tues, 12/17 at 11am. Nothing further needed.

## 2018-11-02 ENCOUNTER — Other Ambulatory Visit: Payer: Self-pay | Admitting: Internal Medicine

## 2018-11-02 ENCOUNTER — Other Ambulatory Visit: Payer: Self-pay | Admitting: Pulmonary Disease

## 2018-11-02 DIAGNOSIS — A310 Pulmonary mycobacterial infection: Secondary | ICD-10-CM

## 2018-11-07 ENCOUNTER — Encounter: Payer: Self-pay | Admitting: Internal Medicine

## 2018-11-07 ENCOUNTER — Ambulatory Visit: Payer: Medicare HMO | Admitting: Internal Medicine

## 2018-11-07 ENCOUNTER — Other Ambulatory Visit: Payer: Self-pay

## 2018-11-07 DIAGNOSIS — H9313 Tinnitus, bilateral: Secondary | ICD-10-CM | POA: Diagnosis not present

## 2018-11-07 DIAGNOSIS — A310 Pulmonary mycobacterial infection: Secondary | ICD-10-CM

## 2018-11-07 DIAGNOSIS — H903 Sensorineural hearing loss, bilateral: Secondary | ICD-10-CM | POA: Diagnosis not present

## 2018-11-07 NOTE — Assessment & Plan Note (Signed)
She has now been back on multidrug therapy for relapsed Mycobacterium avium pneumonia for 11 months.  Monthly sputum cultures have remained positive throughout her therapy but her chest x-ray has not shown any worsening.  She is aware that with her multidrug-resistant organism cure is not likely.  The best we can hope for is that she will tolerate her therapy and that we will keep her infection from getting worse.  She will continue her current regimen and follow-up in 2 months.

## 2018-11-07 NOTE — Addendum Note (Signed)
Addended by: Michel Bickers on: 11/07/2018 04:06 PM   Modules accepted: Orders

## 2018-11-07 NOTE — Progress Notes (Signed)
[facility] for Infectious Disease  Patient Active Problem List   Diagnosis Date Noted  . 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 BRONCHIECTASIS 05/23/2009    Priority: Medium  . Dysphonia 01/11/2018  . Diarrhea 01/11/2018  . Right shoulder pain 01/21/2017  . Encounter for hepatitis C screening test for low risk patient 01/21/2017  . GERD (gastroesophageal reflux disease) 02/21/2016  . 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 Dyslipidemia 11/14/2014  . Allergic rhinitis 05/03/2009    Patient's Medications  New Prescriptions   No medications on file  Previous Medications   ACYCLOVIR (ZOVIRAX) 400 MG TABLET    Take 1 tablet (400 mg total) by mouth 5 (five) times daily.   ALBUTEROL (PROVENTIL HFA;VENTOLIN HFA) 108 (90 BASE) MCG/ACT INHALER    Inhale 2 puffs into the lungs every 6 (six) hours as needed for wheezing or shortness of breath.   ARIKAYCE 590 MG/8.4ML SUSP    Inhale the contents of one vial (590mg ) via [place]ira device 1 time daily as directed.   ATORVASTATIN (LIPITOR) 20 MG TABLET    Take 20 mg by mouth daily.   B COMPLEX VITAMINS (B COMPLEX-B12 PO)    Take 1 tablet by mouth daily.   BENZONATATE (TESSALON) 100 MG CAPSULE    Take 1 capsule (100 mg total) by mouth 3 (three) times daily as needed for cough.   BIOTIN 5000 PO    Take 2 capsules by mouth daily.   CALCIUM CARBONATE-VITAMIN D (CALCIUM-VITAMIN D3) 600-200 MG-UNIT TABS    Take 1 tablet by mouth 2 (two) times daily.     CHOLECALCIFEROL (VITAMIN D3) 1000 UNITS CAPS    Take 2,000 Units by mouth daily.   CLOFAZIMINE POWD    100 mg by Does not apply route daily.   DOCUSATE CALCIUM (STOOL SOFTENER PO)    Take 100 mg by mouth 2 (two) times daily.    ETHAMBUTOL (MYAMBUTOL) 400 MG TABLET    TAKE 3 TABLETS (1,200 MG TOTAL) BY MOUTH DAILY.   FLAXSEED, LINSEED, (FLAX SEED OIL) 1300 MG CAPS    Take by mouth daily.   GLUCOSAMINE-CHONDROITIN 1500-1200 MG/30ML LIQD    Take 1 tablet by  mouth daily.   MAGNESIUM OXIDE (MAG-OX 400 PO)    Take 1 tablet by mouth daily.   MINOCYCLINE (DYNACIN) 100 MG TABLET    Take 100 mg by mouth 1 day or 1 dose.   OMEPRAZOLE (PRILOSEC) 40 MG CAPSULE    TAKE 1 CAPSULE TWICE DAILY   PROBIOTIC PRODUCT (PROBIOTIC DAILY PO)    Take 1 capsule by mouth daily.   RESPIRATORY THERAPY SUPPLIES (FLUTTER) DEVI    Use as directed.   RIFAMPIN (RIFADIN) 300 MG CAPSULE    TAKE 2 CAPSULES (600 MG TOTAL) BY MOUTH DAILY.   SPACER/AERO-HOLDING CHAMBERS (AEROCHAMBER MV) INHALER    Use as instructed   SPACER/AERO-HOLDING CHAMBERS (AEROCHAMBER MV) INHALER    Use as instructed   TURMERIC PO    Take 2,000 mg by mouth daily.    VITAMIN B-12 (CYANOCOBALAMIN) 1000 MCG TABLET    Take 2,000 mcg by mouth daily.  Modified Medications   No medications on file  Discontinued Medications   No medications on file    Subjective: Kiara Medina is in for her routine follow-up visit.  She continues to have spasms of coughing but has not been bringing up as much sputum recently.  She has had no more hemoptysis.  Her  dyspnea on exertion and intermittent wheezing are unchanged.  She continues to take her ethambutol, rifampin and aerosolized amikacin.  Clofazimine was added to her regimen on September 23.Marland Kitchen  She has not noted any improvement.  She has a cataract but has not had any other acute changes in her vision.  She has not had any change in her hearing.  She recently had her monthly audiogram and was told that nothing had changed.  Review of Systems: Review of Systems  Constitutional: Positive for malaise/fatigue. Negative for chills, diaphoresis, fever and weight loss.  HENT: Negative for sore throat.   Respiratory: Positive for cough, sputum production, shortness of breath and wheezing. Negative for hemoptysis.   Cardiovascular: Negative for chest pain.  Gastrointestinal: Negative for abdominal pain, diarrhea, heartburn, nausea and vomiting.  Genitourinary: Negative for dysuria and  frequency.  Musculoskeletal: Positive for joint pain. Negative for myalgias.  Skin: Negative for rash.  Neurological: Negative for dizziness and headaches.    Past Medical History:  Diagnosis Date  . Allergic rhinitis, cause unspecified   . Bronchiectasis without acute exacerbation ([place])   . Cough   . Sinusitis     Social History   Tobacco Use  . Smoking status: Former Smoker    Packs/day: 1.00    Years: 20.00    Pack years: 20.00    Types: Cigarettes    Last attempt to quit: 12/14/1985    Years since quitting: 32.9  . Smokeless tobacco: Never Used  Substance Use Topics  . Alcohol use: Yes    Alcohol/week: 1.0 standard drinks    Types: 1 Glasses of wine per week    Comment: occ  . Drug use: No    Family History  Problem Relation Age of Onset  . Emphysema Sister   . Stroke Mother   . Clotting disorder Mother     No Known Allergies  Objective: Vitals:   11/07/18 1520  BP: 118/75  Pulse: 73  Temp: 98 F (36.7 C)  Weight: 135 lb (61.2 kg)  Height: 5\' 3"  (1.6 m)   Body mass index is 23.91 kg/m.  Physical Exam  Constitutional: She is oriented to person, place, and time. No distress.  She is in good spirits today.  Cardiovascular: Normal rate, regular rhythm and normal heart sounds.  No murmur heard. Pulmonary/Chest: Effort normal. She has wheezes. She has no rales.  Neurological: She is alert and oriented to person, place, and time.  Skin: No rash noted.  Psychiatric: She has a normal mood and affect.   Chest x-ray 10/10/2018  IMPRESSION: Stable chronic reticulonodular opacities with bronchiectasis in the lungs bilaterally, likely related to the clinical history of MAI.    By: Julian Hy M.D.   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  Monthly sputum  cultures have remained positive throughout her therapy but her chest x-ray has not shown any worsening.  She is aware that with her multidrug-resistant organism cure is not likely.  The best we can hope for is that she will tolerate her therapy and that we will keep her infection from getting worse.  She will continue her current regimen and follow-up in 2 months.          Michel Bickers, MD [facility] [phone] pager   [phone] cell 11/07/2018, 3:44 PM

## 2018-11-08 ENCOUNTER — Telehealth: Payer: Self-pay | Admitting: Pharmacist

## 2018-11-08 NOTE — Telephone Encounter (Signed)
[place] to determine the status of Tahirih's grant for assistance with this medication. They informed me that her application is currently pending with an anticipated decision date of 11/27/18. She is already approved through the end of the year. I also called [place] to let her know.

## 2018-11-09 ENCOUNTER — Ambulatory Visit: Payer: Medicare HMO | Admitting: Pulmonary Disease

## 2018-11-16 ENCOUNTER — Ambulatory Visit: Payer: Medicare HMO | Admitting: Pulmonary Disease

## 2018-11-21 ENCOUNTER — Telehealth: Payer: Self-pay | Admitting: Behavioral Health

## 2018-11-21 NOTE — Telephone Encounter (Addendum)
[facility] called to give urgent preliminary lab results from 11/07/2018:  Mycobacteria, Culture, with Fluorochrome Smear  Component 2wk ago  MICRO NUMBER: [id number] P  SPECIMEN QUALITY: Adequate P  Source: SPUTUM P  STATUS: PRELIMINARY P  SMEAR: Rare (1 +) acid-fast bacilli seen using the fluorochrome method.Abnormal  P  ISOLATE 1: acid-fast bacillus Abnormal  P  Comment: Acid-fast bacilli present in liquid culture media. Identification to follow.   [name] RN

## 2018-11-29 ENCOUNTER — Ambulatory Visit: Payer: Medicare HMO | Admitting: Pulmonary Disease

## 2018-11-29 ENCOUNTER — Encounter: Payer: Self-pay | Admitting: Pulmonary Disease

## 2018-11-29 VITALS — BP 128/66 | HR 86 | Ht 62.0 in | Wt 133.6 lb

## 2018-11-29 DIAGNOSIS — A310 Pulmonary mycobacterial infection: Secondary | ICD-10-CM | POA: Diagnosis not present

## 2018-11-29 DIAGNOSIS — J479 Bronchiectasis, uncomplicated: Secondary | ICD-10-CM

## 2018-11-29 LAB — M. AVIUM MIC PANEL
AMIKACIN: >64
CIPROFLOXACIN: 16
ETHAMBUTOL: >16
ETHIONAMIDE: 1.2
ISONIAZID: 4
MOXIFLOXACIN: 2
RIFABUTIN: 1
RIFAMPIN: 8
STREPTOMYCIN: 32

## 2018-11-29 LAB — MYCOBACTERIA,CULT W/FLUOROCHROME SMEAR
MICRO NUMBER: 91296314
SPECIMEN QUALITY:: ADEQUATE

## 2018-11-29 MED ORDER — SODIUM CHLORIDE 3 % IN NEBU: INHALATION_SOLUTION | RESPIRATORY_TRACT | 3 refills | Status: DC

## 2018-11-29 MED ORDER — SODIUM CHLORIDE 3 % IN NEBU: INHALATION_SOLUTION | RESPIRATORY_TRACT | 11 refills | Status: DC

## 2018-11-29 NOTE — Patient Instructions (Signed)
Bronchiectasis: Start using hypertonic saline nebulized twice a day, this will make you cough up mucus on a regular basis Hopefully this will help you clear out your lung so that when you are doing day-to-day activities you want me burdened with cough or mucus production during the day Use albuterol prior to using the hypertonic saline  MAI: Continue treatment as directed by the infectious diseases clinic including inhaled antibiotics and oral antibiotics  Shortness of breath: This is due to multiple problems, but I think physical deconditioning (getting out of shape) is a big problem I want you to start exercising regularly Try to increase protein intake to 1.2 to 1.4 g/kg a day for you this would be about 85 g of protein a day  We will see you back in about 6 weeks.  If you are not feeling better at that point we can consider a therapy vest.

## 2018-11-29 NOTE — Progress Notes (Signed)
Subjective:    Patient ID: Kiara Medina, female    DOB: 03-Dec-1946, 72 y.o.   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  Has been treated since December 2015 as guided by Dr. Megan Medina and infectious diseases. She had bronchiectasis seen on a chest CT in 2011. 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 In 2015 she did produce mucus which grew nocardia and Mycobacterium avium complex. She was started on 3 drug therapy. Smoked 1ppd for 25 years, quit 1987 SSpiro 2010:  FVC 2.61 (87%), FEV1 2.11 (97%), ratio 81 Spiro 2015:  FVC 1.86 (65%), FEV1 1.34 (61%), ratio 72   HPI Chief Complaint  Patient presents with  . Follow-up    c/o stable prod cough, sob with exertion.     No bronchitis since starting treatment for MAI, but she still struggles with cough and mucus production.  She says that she is not really doing anything for mucociliary clearance.  She has a flutter valve but she says that when she uses it it does not make her cough.  She struggles with shortness of breath when she runs a vacuum cleaner climb stairs.  She recently retired.  She is not exercising routinely.  She says it is hard for her to go to her local gym because she feels more short of breath when she climbs the stairs to get to the track.  She continues to take antibiotics as directed by the infectious diseases clinic for her MAI. Some dyspnea on exertion when she runs vacuum, climbs stairs     Past Medical History:  Diagnosis Date  . Allergic rhinitis, cause unspecified   . Bronchiectasis without acute exacerbation ([place])   . Cough   . Sinusitis       Review of Systems  Constitutional: Negative for chills, fatigue and fever.  HENT: Negative for nosebleeds, postnasal drip and rhinorrhea.   Respiratory: Positive for cough and shortness of breath. Negative for wheezing and stridor.   Cardiovascular: Negative for chest pain, palpitations and  leg swelling.       Objective:   Physical Exam Vitals:   11/29/18 1115  BP: 128/66  Pulse: 86  SpO2: 93%  Weight: 133 lb 9.6 oz (60.6 kg)  Height: _0  (1.575 m)   RA  Gen: well appearing HENT: OP clear, TM's clear, neck supple PULM: Rhonchi wheezing bilaterally B, normal percussion CV: RRR, no mgr, trace edema GI: BS+, soft, nontender Derm: no cyanosis or rash Psyche: normal mood and affect   PFT SSpiro 2010:  FVC 2.61 (87%), FEV1 2.11 (97%), ratio 81 Spiro 2015:  FVC 1.86 (65%), FEV1 1.34 (61%), ratio 72 January 2019: FVC 2.1 L 79% predicted, FEV1 1.6 L 80% predicted, ratio 75  Chest imaging: January 2017 chest x-ray images independently reviewed showing bronchiectatic changes in the bases bilaterally February 2015 high-resolution CT chest images independently reviewed showing basilar predominant tree-in-bud abnormalities, scattered bronchiectasis most predominant in the lingula in the right middle lobe as well as some centrilobular emphysema.  Microbiology: June 2018 sputum culture positive for MAI September 2018 sputum culture positive for MAI January 27, 2018 sputum culture positive for MAI March 2014 sputum culture positive for MAI  Records from her most recent visit with Dr. Megan Medina reviewed.  The time she was seen in November she had been on treatment for MAI for 11 months, plans were made for her to continue on anti-MAI therapy.  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  This is due to her bronchiectasis and she currently does not use any form of mucociliary clearance measures.  She has dealt with these symptoms for more than  a year.  Her flutter valve has been ineffective.  I think we need to focus on mucociliary clearance and she needs to exercise more on a regular basis because she is now suffering from physical deconditioning.  Plan: Bronchiectasis: Start using hypertonic saline nebulized twice a day, this will make you cough up mucus on a regular basis Hopefully this will help you clear out your lung so that when you are doing day-to-day activities you want me burdened with cough or mucus production during the day Use albuterol prior to using the hypertonic saline  MAI: Continue treatment as directed by the infectious diseases clinic including inhaled antibiotics and oral antibiotics  Shortness of breath: worsening This is due to multiple problems, but I think physical deconditioning (getting out of shape) is a big problem I want you to start exercising regularly Try to increase protein intake to 1.2 to 1.4 g/kg a day for you this would be about 85 g of protein a day  We will see you back in about 6 weeks.  If you are not feeling better at that point we can consider a therapy vest.  > 50% of this 25 min visit face to face   Current Outpatient Medications:  .  acyclovir (ZOVIRAX) 400 MG tablet, Take 1 tablet (400 mg total) by mouth 5 (five) times daily. (Patient taking differently: Take 400 mg by mouth 2 (two) times daily. ), Disp: , Rfl:  .  albuterol (PROVENTIL HFA;VENTOLIN HFA) 108 (90 Base) MCG/ACT inhaler, Inhale 2 puffs into the lungs every 6 (six) hours as needed for wheezing or shortness of breath., Disp: 1 Inhaler, Rfl: 0 .  ARIKAYCE 590 MG/8.4ML SUSP, Inhale the contents of one vial (554m) via Lamira device 1 time daily as directed., Disp: 28 vial, Rfl: 10 .  atorvastatin (LIPITOR) 20 MG tablet, Take 20 mg by mouth daily., Disp: , Rfl:  .  B Complex Vitamins (B COMPLEX-B12 PO), Take 1 tablet by mouth daily., Disp: , Rfl:  .  BIOTIN 5000 PO, Take 2 capsules by mouth daily., Disp: , Rfl:  .   Calcium Carbonate-Vitamin D (CALCIUM-VITAMIN D3) 600-200 MG-UNIT TABS, Take 1 tablet by mouth 2 (two) times daily.  , Disp: , Rfl:  .  Cholecalciferol (VITAMIN D3) 1000 UNITS CAPS, Take 2,000 Units by mouth daily., Disp: , Rfl:  .  CLOFAZIMINE PO, Take 2 tablets by mouth daily., Disp: , Rfl:  .  Docusate Calcium (STOOL SOFTENER PO), Take 100 mg by mouth 2 (two) times daily. , Disp: , Rfl:  .  ethambutol (MYAMBUTOL) 400 MG tablet, TAKE 3 TABLETS (1,200 MG TOTAL) BY MOUTH DAILY., Disp: 270 tablet, Rfl: 0 .  Flaxseed, Linseed, (FLAX SEED OIL) 1300 MG CAPS, Take by mouth daily., Disp: , Rfl:  .  Glucosamine-Chondroitin 1500-1200 MG/30ML LIQD, Take 1 tablet by mouth daily., Disp: , Rfl:  .  Magnesium Oxide (MAG-OX 400 PO), Take 1 tablet by mouth daily., Disp: , Rfl:  .  omeprazole (PRILOSEC) 40 MG capsule, TAKE 1 CAPSULE  TWICE DAILY, Disp: 180 capsule, Rfl: 0 .  Probiotic Product (PROBIOTIC DAILY PO), Take 1 capsule by mouth daily., Disp: , Rfl:  .  Respiratory Therapy Supplies (FLUTTER) DEVI, Use as directed., Disp: 1 each, Rfl: 0 .  rifampin (RIFADIN) 300 MG capsule, TAKE 2 CAPSULES (600 MG TOTAL) BY MOUTH DAILY., Disp: 180 capsule, Rfl: 0 .  Spacer/Aero-Holding Chambers (AEROCHAMBER MV) inhaler, Use as instructed, Disp: 1 each, Rfl: 2 .  TURMERIC PO, Take 2,000 mg by mouth daily. , Disp: , Rfl:  .  vitamin B-12 (CYANOCOBALAMIN) 1000 MCG tablet, Take 2,000 mcg by mouth daily., Disp: , Rfl:  .  sodium chloride HYPERTONIC 3 % nebulizer solution, 67m via nebulizer BID.  DX: J47.9, Disp: 720 mL, Rfl: 3

## 2018-12-01 DIAGNOSIS — H2511 Age-related nuclear cataract, right eye: Secondary | ICD-10-CM | POA: Diagnosis not present

## 2018-12-05 ENCOUNTER — Telehealth: Payer: Self-pay | Admitting: Pulmonary Disease

## 2018-12-05 NOTE — Telephone Encounter (Signed)
Called and spoke with Patient.  She stated that she was given 93ml vials instead of 47ml vials and that it took her 57min to do a neb treatment with her 3% hypertonic.  [place], wrong size given.  Per [facility] pharmacist, take 1/4 vial today and they are reordering her a new order of her neb at no charge to the Patient or insurance, that she can pick up tomorrow 12/06/18.  Attempted to call Patient and received VM.  Left message for her to call back.

## 2018-12-05 NOTE — Telephone Encounter (Signed)
Pt is calling back [phone]

## 2018-12-05 NOTE — Telephone Encounter (Signed)
Returned call to Patient.  Explained [facility] gave the wrong size nebs.  She can get a new box tomorrow at no charge.  Patient stated understanding.   Per [facility] pharmacist, take 1/4 vial today and they are reordering her a new order of her neb at no charge to the Patient or insurance, that she can pick up tomorrow 12/06/18.  Nothing further at this time.

## 2018-12-15 DIAGNOSIS — E785 Hyperlipidemia, unspecified: Secondary | ICD-10-CM | POA: Diagnosis not present

## 2018-12-15 DIAGNOSIS — J479 Bronchiectasis, uncomplicated: Secondary | ICD-10-CM | POA: Diagnosis not present

## 2018-12-15 DIAGNOSIS — Z0000 Encounter for general adult medical examination without abnormal findings: Secondary | ICD-10-CM | POA: Diagnosis not present

## 2018-12-15 DIAGNOSIS — R7301 Impaired fasting glucose: Secondary | ICD-10-CM | POA: Diagnosis not present

## 2018-12-15 DIAGNOSIS — L821 Other seborrheic keratosis: Secondary | ICD-10-CM | POA: Diagnosis not present

## 2018-12-15 DIAGNOSIS — Z85828 Personal history of other malignant neoplasm of skin: Secondary | ICD-10-CM | POA: Diagnosis not present

## 2018-12-15 DIAGNOSIS — E559 Vitamin D deficiency, unspecified: Secondary | ICD-10-CM | POA: Diagnosis not present

## 2018-12-15 DIAGNOSIS — L814 Other melanin hyperpigmentation: Secondary | ICD-10-CM | POA: Diagnosis not present

## 2018-12-15 DIAGNOSIS — K219 Gastro-esophageal reflux disease without esophagitis: Secondary | ICD-10-CM | POA: Diagnosis not present

## 2018-12-15 DIAGNOSIS — Z139 Encounter for screening, unspecified: Secondary | ICD-10-CM | POA: Diagnosis not present

## 2018-12-15 DIAGNOSIS — L820 Inflamed seborrheic keratosis: Secondary | ICD-10-CM | POA: Diagnosis not present

## 2018-12-15 DIAGNOSIS — A310 Pulmonary mycobacterial infection: Secondary | ICD-10-CM | POA: Diagnosis not present

## 2018-12-16 ENCOUNTER — Telehealth: Payer: Self-pay | Admitting: Pharmacist

## 2018-12-16 NOTE — Telephone Encounter (Signed)
Patient's PA for [employer] was approved until 12/14/2019.

## 2018-12-16 NOTE — Telephone Encounter (Signed)
Completed patient's PA for [employer] today to get renewal for a year.

## 2018-12-19 DIAGNOSIS — J479 Bronchiectasis, uncomplicated: Secondary | ICD-10-CM | POA: Diagnosis not present

## 2018-12-19 DIAGNOSIS — A310 Pulmonary mycobacterial infection: Secondary | ICD-10-CM | POA: Diagnosis not present

## 2018-12-20 LAB — MYCOBACTERIA,CULT W/FLUOROCHROME SMEAR
MICRO NUMBER:: 91423375
SPECIMEN QUALITY: ADEQUATE

## 2018-12-22 DIAGNOSIS — H25811 Combined forms of age-related cataract, right eye: Secondary | ICD-10-CM | POA: Diagnosis not present

## 2019-01-10 ENCOUNTER — Encounter: Payer: Self-pay | Admitting: Internal Medicine

## 2019-01-10 ENCOUNTER — Ambulatory Visit: Payer: Medicare HMO | Admitting: Internal Medicine

## 2019-01-10 ENCOUNTER — Other Ambulatory Visit: Payer: Self-pay | Admitting: Internal Medicine

## 2019-01-10 VITALS — BP 124/75 | Temp 97.7°F

## 2019-01-10 DIAGNOSIS — A310 Pulmonary mycobacterial infection: Secondary | ICD-10-CM

## 2019-01-10 NOTE — Assessment & Plan Note (Signed)
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  She is aware that there is little to no hope of ever curing this infection.  The best we can hope for is suppression and prevention of significant worsening.  Unfortunately the only way to know if she is getting any improvement from her antibiotics would be to stop them and she is currently unwilling to do that.  She has had some improvement in her frequent bowel movements with low-dose Imodium.  She will continue her current regimen for now and follow-up in 2 months.  She will get a repeat sputum culture today, CBC and CMP.

## 2019-01-10 NOTE — Progress Notes (Signed)
[facility] for Infectious Disease  Patient Active Problem List   Diagnosis Date Noted  . 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 BRONCHIECTASIS 05/23/2009    Priority: Medium  . Dysphonia 01/11/2018  . Diarrhea 01/11/2018  . Right shoulder pain 01/21/2017  . Encounter for hepatitis C screening test for low risk patient 01/21/2017  . GERD (gastroesophageal reflux disease) 02/21/2016  . 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 Dyslipidemia 11/14/2014  . Allergic rhinitis 05/03/2009    Patient's Medications  New Prescriptions   No medications on file  Previous Medications   ACYCLOVIR (ZOVIRAX) 400 MG TABLET    Take 1 tablet (400 mg total) by mouth 5 (five) times daily.   ALBUTEROL (PROVENTIL HFA;VENTOLIN HFA) 108 (90 BASE) MCG/ACT INHALER    Inhale 2 puffs into the lungs every 6 (six) hours as needed for wheezing or shortness of breath.   ARIKAYCE 590 MG/8.4ML SUSP    Inhale the contents of one vial (54m) via Lamira device 1 time daily as directed.   ATORVASTATIN (LIPITOR) 20 MG TABLET    Take 20 mg by mouth daily.   B COMPLEX VITAMINS (B COMPLEX-B12 PO)    Take 1 tablet by mouth daily.   BIOTIN 5000 PO    Take 2 capsules by mouth daily.   CALCIUM CARBONATE-VITAMIN D (CALCIUM-VITAMIN D3) 600-200 MG-UNIT TABS    Take 1 tablet by mouth 2 (two) times daily.     CHOLECALCIFEROL (VITAMIN D3) 1000 UNITS CAPS    Take 2,000 Units by mouth daily.   CLOFAZIMINE PO    Take 2 tablets by mouth daily.   DOCUSATE CALCIUM (STOOL SOFTENER PO)    Take 100 mg by mouth 2 (two) times daily.    ETHAMBUTOL (MYAMBUTOL) 400 MG TABLET    TAKE 3 TABLETS (1,200 MG TOTAL) BY MOUTH DAILY.   FLAXSEED, LINSEED, (FLAX SEED OIL) 1300 MG CAPS    Take by mouth daily.   GLUCOSAMINE-CHONDROITIN 1500-1200 MG/30ML LIQD    Take 1 tablet by mouth daily.   MAGNESIUM OXIDE (MAG-OX 400 PO)    Take 1 tablet by mouth daily.   OMEPRAZOLE (PRILOSEC) 40 MG CAPSULE    TAKE 1 CAPSULE  TWICE DAILY   PROBIOTIC PRODUCT (PROBIOTIC DAILY PO)    Take 1 capsule by mouth daily.   RESPIRATORY THERAPY SUPPLIES (FLUTTER) DEVI    Use as directed.   RIFAMPIN (RIFADIN) 300 MG CAPSULE    TAKE 2 CAPSULES (600 MG TOTAL) BY MOUTH DAILY.   SODIUM CHLORIDE HYPERTONIC 3 % NEBULIZER SOLUTION    442mvia nebulizer BID.  DX: J47.9   SPACER/AERO-HOLDING CHAMBERS (AEROCHAMBER MV) INHALER    Use as instructed   TURMERIC PO    Take 2,000 mg by mouth daily.    VITAMIN B-12 (CYANOCOBALAMIN) 1000 MCG TABLET    Take 2,000 mcg by mouth daily.  Modified Medications   No medications on file  Discontinued Medications   No medications on file    Subjective: JuDevotas in for her routine follow-up visit.  She is now completed 13 months of therapy for relapsed Mycobacterium avium pneumonia.  She has been receiving oral ethambutol, rifampin and clofazimine along with aerosolized amikacin.  She has noted some more frequent soft bowel movements since she started these therapies.  She is not having any liquid diarrhea.  She has not had any fever, chills or sweats.  She has not noted much improvement or  worsening in her chronic cough, sputum production or dyspnea on exertion since starting back on therapy.  She has not had any further episodes of hemoptysis. She remains quite fatigued.  She recently started on nebulized saline each morning and has noted some improvement in her wheezing.  Review of Systems: Review of Systems  Constitutional: Positive for malaise/fatigue. Negative for chills, diaphoresis, fever and weight loss.  HENT: Positive for hearing loss and tinnitus.        She has had no change in her hearing loss or tinnitus since starting on amikacin and her audiograms have not revealed any acute changes.  Respiratory: Positive for cough, sputum production, shortness of breath and wheezing. Negative for hemoptysis.   Cardiovascular: Negative for chest pain.  Gastrointestinal: Negative for abdominal pain,  diarrhea, nausea and vomiting.  Musculoskeletal: Positive for joint pain.    Past Medical History:  Diagnosis Date  . Allergic rhinitis, cause unspecified   . Bronchiectasis without acute exacerbation ([place])   . Cough   . Sinusitis     Social History   Tobacco Use  . Smoking status: Former Smoker    Packs/day: 1.00    Years: 20.00    Pack years: 20.00    Types: Cigarettes    Last attempt to quit: 12/14/1985    Years since quitting: 33.0  . Smokeless tobacco: Never Used  Substance Use Topics  . Alcohol use: Yes    Alcohol/week: 1.0 standard drinks    Types: 1 Glasses of wine per week    Comment: occ  . Drug use: No    Family History  Problem Relation Age of Onset  . Emphysema Sister   . Stroke Mother   . Clotting disorder Mother     No Known Allergies  Objective: Vitals:   01/10/19 1639  BP: 124/75  Temp: 97.7 F (36.5 C)   There is no height or weight on file to calculate BMI.  Physical Exam Constitutional:      Comments: She is in reasonably good spirits today.  Cardiovascular:     Rate and Rhythm: Normal rate and regular rhythm.     Heart sounds: No murmur.  Pulmonary:     Effort: Pulmonary effort is normal.     Breath sounds: Normal breath sounds. No wheezing or rales.  Psychiatric:        Mood and Affect: Mood normal.     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  She is aware that there is little to no hope of ever curing this infection.  The best we can hope for is suppression and prevention of significant worsening.  Unfortunately the only way to know if she is getting any improvement from her antibiotics would be to stop them and she is currently unwilling to do that.  She has had some improvement in her frequent bowel movements with low-dose Imodium.  She will continue  her current regimen for now and follow-up in 2 months.  She will get a repeat sputum culture today, CBC and CMP.      Relevant Orders   MYCOBACTERIA, CULTURE, WITH FLUOROCHROME SMEAR   CBC w/Diff   Comp Met (CMET)       Michel Bickers, MD [facility] [phone] pager   [phone] cell  01/10/2019, 5:05 PM

## 2019-01-12 DIAGNOSIS — M25561 Pain in right knee: Secondary | ICD-10-CM | POA: Diagnosis not present

## 2019-01-14 ENCOUNTER — Other Ambulatory Visit: Payer: Self-pay | Admitting: Internal Medicine

## 2019-01-14 ENCOUNTER — Other Ambulatory Visit: Payer: Self-pay | Admitting: Pulmonary Disease

## 2019-01-14 DIAGNOSIS — A310 Pulmonary mycobacterial infection: Secondary | ICD-10-CM

## 2019-01-17 ENCOUNTER — Encounter: Payer: Self-pay | Admitting: Pulmonary Disease

## 2019-01-17 ENCOUNTER — Ambulatory Visit: Payer: Medicare HMO | Admitting: Pulmonary Disease

## 2019-01-17 VITALS — BP 116/68 | HR 87 | Ht 62.0 in | Wt 134.0 lb

## 2019-01-17 DIAGNOSIS — A310 Pulmonary mycobacterial infection: Secondary | ICD-10-CM

## 2019-01-17 DIAGNOSIS — R0602 Shortness of breath: Secondary | ICD-10-CM | POA: Diagnosis not present

## 2019-01-17 DIAGNOSIS — S83231A Complex tear of medial meniscus, current injury, right knee, initial encounter: Secondary | ICD-10-CM | POA: Diagnosis not present

## 2019-01-17 DIAGNOSIS — M7121 Synovial cyst of popliteal space [Baker], right knee: Secondary | ICD-10-CM | POA: Diagnosis not present

## 2019-01-17 DIAGNOSIS — J479 Bronchiectasis, uncomplicated: Secondary | ICD-10-CM | POA: Diagnosis not present

## 2019-01-17 DIAGNOSIS — S83281A Other tear of lateral meniscus, current injury, right knee, initial encounter: Secondary | ICD-10-CM | POA: Diagnosis not present

## 2019-01-17 DIAGNOSIS — M25561 Pain in right knee: Secondary | ICD-10-CM | POA: Diagnosis not present

## 2019-01-17 DIAGNOSIS — X58XXXA Exposure to other specified factors, initial encounter: Secondary | ICD-10-CM | POA: Diagnosis not present

## 2019-01-17 NOTE — Patient Instructions (Signed)
Bronchiectasis: Keep using hypertonic saline 1-2 times a day Please check to make sure that the solution you have at home is 3 or 7% sodium chloride, if not then we need to change the prescription Call us if worsening chest congestion or mucus production Stay physically active  Shortness of breath: Keep up with physical activity as much as possible, exercises much as possible Maintain a high protein diet, 1.2 to 1.6 g/kg body weight /day  MAI: Continue taking the medications as directed by the infectious diseases clinic  Follow-up with me in 6 months or sooner if needed

## 2019-01-17 NOTE — Progress Notes (Signed)
Subjective:    Patient ID: Kiara Medina, female    DOB: 1946/03/05, 73 y.o.   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  Has been treated since December 2015 as guided by Dr. Megan Medina and infectious diseases. She had bronchiectasis seen on a chest CT in 2011. 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 In 2015 she did produce mucus which grew nocardia and Mycobacterium avium complex. She was started on 3 drug therapy. Smoked 1ppd for 25 years, quit 1987 Spiro 2010:  FVC 2.61 (87%), FEV1 2.11 (97%), ratio 81 Spiro 2015:  FVC 1.86 (65%), FEV1 1.34 (61%), ratio 72   HPI Chief Complaint  Patient presents with  . Follow-up    pt c/o stable prod cough with clear/white mucus.  states she is feeling well overall.    Kamica says that she is no longer coughing up quite as much mucus that she used to.  She has been using the hypertonic saline regularly.  This helps her clear mucus out of her chest.  Her breathing is perhaps slightly improved though she does still have some shortness of breath with heavy activity.  She has not been sick with bronchitis or pneumonia since the last visit.  She remains compliant with her oral and inhaled medications for MAI.     Past Medical History:  Diagnosis Date  . Allergic rhinitis, cause unspecified   . Bronchiectasis without acute exacerbation ([place])   . Cough   . Sinusitis       Review of Systems  Constitutional: Negative for chills, fatigue and fever.  HENT: Negative for nosebleeds, postnasal drip and rhinorrhea.   Respiratory: Positive for cough and shortness of breath. Negative for wheezing and stridor.   Cardiovascular: Negative for chest pain, palpitations and leg swelling.       Objective:   Physical Exam Vitals:   01/17/19 1203  BP: 116/68  Pulse: 87  SpO2: 94%  Weight: 134 lb (60.8 kg)  Height: _0  (1.575 m)   RA  Gen: well appearing HENT: OP clear, neck  supple PULM: Few rhonchi, mostly clear B, normal percussion CV: RRR, no mgr, trace edema GI: BS+, soft, nontender Derm: no cyanosis or rash Psyche: normal mood and affect   PFT SSpiro 2010:  FVC 2.61 (87%), FEV1 2.11 (97%), ratio 81 Spiro 2015:  FVC 1.86 (65%), FEV1 1.34 (61%), ratio 72 January 2019: FVC 2.1 L 79% predicted, FEV1 1.6 L 80% predicted, ratio 75  Chest imaging: January 2017 chest x-ray images independently reviewed showing bronchiectatic changes in the bases bilaterally February 2015 high-resolution CT chest images independently reviewed showing basilar predominant tree-in-bud abnormalities, scattered bronchiectasis most predominant in the lingula in the right middle lobe as well as some centrilobular emphysema.  Microbiology: October 2019 sputum culture MAI 05/2018 Sputum culture MAI 01/2018 Sputum culture MAI June 2018 sputum culture positive for MAI September 2018 sputum culture positive for MAI January 27, 2018 sputum culture positive for MAI March 2014 sputum culture positive for MAI   Recent visit January 2020 with ID reviewed, she was seen by Dr. Megan Medina for persistently positive sputum cultures for MAI.  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  She is getting very good care with infectious diseases and I agree with her current management.  If she has worsening chest congestion despite using hypertonic saline then she could be considered for a therapy vest for better mucociliary clearance but I do not think that is necessary right now.  Plan: Bronchiectasis: Keep using hypertonic saline 1-2 times a day Please check to  make sure that the solution you have at home is 3 or 7% sodium chloride, if not then we need to change the prescription Call us if worsening chest congestion or mucus production Stay physically active  Shortness of breath: Keep up with physical activity as much as possible, exercises much as possible Maintain a high protein diet, 1.2 to 1.6 g/kg body weight /day  MAI: Continue taking the medications as directed by the infectious diseases clinic  Follow-up with me in 6 months or sooner if needed    Current Outpatient Medications:  .  acyclovir (ZOVIRAX) 400 MG tablet, Take 1 tablet (400 mg total) by mouth 5 (five) times daily. (Patient taking differently: Take 400 mg by mouth 2 (two) times daily. ), Disp: , Rfl:  .  albuterol (PROVENTIL HFA;VENTOLIN HFA) 108 (90 Base) MCG/ACT inhaler, Inhale 2 puffs into the lungs every 6 (six) hours as needed for wheezing or shortness of breath., Disp: 1 Inhaler, Rfl: 0 .  ARIKAYCE 590 MG/8.4ML SUSP, Inhale the contents of one vial (549m) via Lamira device 1 time daily as directed., Disp: 28 vial, Rfl: 10 .  atorvastatin (LIPITOR) 20 MG tablet, Take 20 mg by mouth daily., Disp: , Rfl:  .  B Complex Vitamins (B COMPLEX-B12 PO), Take 1 tablet by mouth daily., Disp: , Rfl:  .  BIOTIN 5000 PO, Take 2 capsules by mouth daily., Disp: , Rfl:  .  Calcium Carbonate-Vitamin D (CALCIUM-VITAMIN D3) 600-200 MG-UNIT TABS, Take 1 tablet by mouth 2 (two) times daily.  , Disp: , Rfl:  .  Cholecalciferol (VITAMIN D3) 1000 UNITS CAPS, Take 2,000 Units by mouth daily., Disp: , Rfl:  .  CLOFAZIMINE PO, Take 2 tablets by mouth daily., Disp: , Rfl:  .  Docusate Calcium (STOOL SOFTENER PO), Take 100 mg by mouth 2 (two) times daily. , Disp: , Rfl:  .  ethambutol (MYAMBUTOL) 400 MG tablet, TAKE 3 TABLETS (1,200 MG TOTAL) BY MOUTH DAILY., Disp: 270 tablet, Rfl: 0 .  Flaxseed, Linseed, (FLAX SEED OIL) 1300 MG CAPS, Take by mouth daily., Disp: , Rfl:  .  Glucosamine-Chondroitin  1500-1200 MG/30ML LIQD, Take 1 tablet by mouth daily., Disp: , Rfl:  .  omeprazole (PRILOSEC) 40 MG capsule, TAKE 1 CAPSULE TWICE DAILY, Disp: 180 capsule, Rfl: 0 .  Probiotic Product (PROBIOTIC DAILY PO), Take 1 capsule by mouth daily., Disp: , Rfl:  .  Respiratory Therapy Supplies (FLUTTER) DEVI, Use as directed., Disp: 1 each, Rfl: 0 .  rifampin (RIFADIN) 300 MG capsule, TAKE 2 CAPSULES (600 MG TOTAL) BY MOUTH DAILY., Disp: 180 capsule, Rfl: 0 .  sodium chloride HYPERTONIC 3 % nebulizer solution, 461mvia nebulizer BID.  DX: J47.9, Disp: 720 mL, Rfl: 3 .  Spacer/Aero-Holding Chambers (AEROCHAMBER MV) inhaler, Use as instructed, Disp: 1 each, Rfl: 2 .  TURMERIC PO, Take 2,000 mg by mouth daily. , Disp: , Rfl:  .  vitamin B-12 (CYANOCOBALAMIN) 1000 MCG tablet, Take 2,000 mcg by mouth daily., Disp: , Rfl:

## 2019-01-18 ENCOUNTER — Telehealth: Payer: Self-pay | Admitting: Pulmonary Disease

## 2019-01-18 NOTE — Telephone Encounter (Signed)
Spoke to pt and relayed Dr. Anastasia Pall recommendation to not change anything and keep her neb solution the same.  Nothing further needed.

## 2019-01-18 NOTE — Telephone Encounter (Signed)
Good, no changes needed.  

## 2019-01-18 NOTE — Telephone Encounter (Signed)
Called and spoke with Patient.  She stated that Dr Lake Bells wanted to know is her home sodium chloride percentage.  She stated that she is on 3% solution.  Message routed to Dr Lake Bells

## 2019-01-19 DIAGNOSIS — J479 Bronchiectasis, uncomplicated: Secondary | ICD-10-CM | POA: Diagnosis not present

## 2019-01-19 DIAGNOSIS — A310 Pulmonary mycobacterial infection: Secondary | ICD-10-CM | POA: Diagnosis not present

## 2019-01-24 DIAGNOSIS — S83241D Other tear of medial meniscus, current injury, right knee, subsequent encounter: Secondary | ICD-10-CM | POA: Diagnosis not present

## 2019-01-24 DIAGNOSIS — S83281D Other tear of lateral meniscus, current injury, right knee, subsequent encounter: Secondary | ICD-10-CM | POA: Diagnosis not present

## 2019-02-01 DIAGNOSIS — M2241 Chondromalacia patellae, right knee: Secondary | ICD-10-CM | POA: Diagnosis not present

## 2019-02-01 DIAGNOSIS — S83271A Complex tear of lateral meniscus, current injury, right knee, initial encounter: Secondary | ICD-10-CM | POA: Diagnosis not present

## 2019-02-01 DIAGNOSIS — M6751 Plica syndrome, right knee: Secondary | ICD-10-CM | POA: Diagnosis not present

## 2019-02-01 DIAGNOSIS — M659 Synovitis and tenosynovitis, unspecified: Secondary | ICD-10-CM | POA: Diagnosis not present

## 2019-02-01 DIAGNOSIS — S83231A Complex tear of medial meniscus, current injury, right knee, initial encounter: Secondary | ICD-10-CM | POA: Diagnosis not present

## 2019-02-01 DIAGNOSIS — G8918 Other acute postprocedural pain: Secondary | ICD-10-CM | POA: Diagnosis not present

## 2019-02-02 ENCOUNTER — Telehealth: Payer: Self-pay | Admitting: Pulmonary Disease

## 2019-02-02 NOTE — Telephone Encounter (Signed)
Patient states that she had a breathing treatment before surgery  yesterday and it was albuterol she says that her cough has since went away and she want to know if it may beneficial for her to take this on a regular bases.   I advised her that BQ is out of office  Today but when he returned we would call her with his recommendations .

## 2019-02-03 NOTE — Telephone Encounter (Signed)
Sure She can take albuterol neb q4-6h prn Please Rx if she doesn't have

## 2019-02-06 NOTE — Telephone Encounter (Signed)
Spoke with patient. She stated that she just wanted to know if the neb solution would help with her cough. She does not want another neb solution. She will call back if she feels the need to have the neb solution.   Nothing further needed at time of call.

## 2019-02-08 DIAGNOSIS — S83231D Complex tear of medial meniscus, current injury, right knee, subsequent encounter: Secondary | ICD-10-CM | POA: Diagnosis not present

## 2019-02-08 DIAGNOSIS — M25561 Pain in right knee: Secondary | ICD-10-CM | POA: Diagnosis not present

## 2019-02-13 DIAGNOSIS — S83231D Complex tear of medial meniscus, current injury, right knee, subsequent encounter: Secondary | ICD-10-CM | POA: Diagnosis not present

## 2019-02-13 DIAGNOSIS — M25561 Pain in right knee: Secondary | ICD-10-CM | POA: Diagnosis not present

## 2019-02-16 DIAGNOSIS — S83231D Complex tear of medial meniscus, current injury, right knee, subsequent encounter: Secondary | ICD-10-CM | POA: Diagnosis not present

## 2019-02-16 DIAGNOSIS — M25561 Pain in right knee: Secondary | ICD-10-CM | POA: Diagnosis not present

## 2019-02-17 DIAGNOSIS — A310 Pulmonary mycobacterial infection: Secondary | ICD-10-CM | POA: Diagnosis not present

## 2019-02-17 DIAGNOSIS — J479 Bronchiectasis, uncomplicated: Secondary | ICD-10-CM | POA: Diagnosis not present

## 2019-02-21 ENCOUNTER — Telehealth: Payer: Self-pay | Admitting: Pharmacist

## 2019-02-21 DIAGNOSIS — M25561 Pain in right knee: Secondary | ICD-10-CM | POA: Diagnosis not present

## 2019-02-21 DIAGNOSIS — S83231D Complex tear of medial meniscus, current injury, right knee, subsequent encounter: Secondary | ICD-10-CM | POA: Diagnosis not present

## 2019-02-21 NOTE — Telephone Encounter (Signed)
I have a refill of patient's clofazimine in my office when she needs it.

## 2019-02-22 ENCOUNTER — Telehealth: Payer: Self-pay | Admitting: Behavioral Health

## 2019-02-22 LAB — CBC WITH DIFFERENTIAL/PLATELET
ABSOLUTE MONOCYTES: 507 {cells}/uL (ref 200–950)
BASOS PCT: 0.3 %
Basophils Absolute: 18 cells/uL (ref 0–200)
Eosinophils Absolute: 289 cells/uL (ref 15–500)
Eosinophils Relative: 4.9 %
HCT: 41.4 % (ref 35.0–45.0)
Hemoglobin: 14.2 g/dL (ref 11.7–15.5)
LYMPHS ABS: 1929 {cells}/uL (ref 850–3900)
MCH: 32 pg (ref 27.0–33.0)
MCHC: 34.3 g/dL (ref 32.0–36.0)
MCV: 93.2 fL (ref 80.0–100.0)
MPV: 11.2 fL (ref 7.5–12.5)
Monocytes Relative: 8.6 %
NEUTROS ABS: 3157 {cells}/uL (ref 1500–7800)
Neutrophils Relative %: 53.5 %
PLATELETS: 174 10*3/uL (ref 140–400)
RBC: 4.44 10*6/uL (ref 3.80–5.10)
RDW: 12.8 % (ref 11.0–15.0)
TOTAL LYMPHOCYTE: 32.7 %
WBC: 5.9 10*3/uL (ref 3.8–10.8)

## 2019-02-22 LAB — COMPREHENSIVE METABOLIC PANEL
AG Ratio: 1.5 (calc) (ref 1.0–2.5)
ALBUMIN MSPROF: 3.6 g/dL (ref 3.6–5.1)
ALKALINE PHOSPHATASE (APISO): 63 U/L (ref 33–130)
ALT: 14 U/L (ref 6–29)
AST: 21 U/L (ref 10–35)
BUN/Creatinine Ratio: 16 (calc) (ref 6–22)
BUN: 16 mg/dL (ref 7–25)
CALCIUM: 8.9 mg/dL (ref 8.6–10.4)
CHLORIDE: 107 mmol/L (ref 98–110)
CO2: 28 mmol/L (ref 20–32)
Creat: 0.97 mg/dL — ABNORMAL HIGH (ref 0.60–0.93)
Globulin: 2.4 g/dL (calc) (ref 1.9–3.7)
Glucose, Bld: 92 mg/dL (ref 65–99)
POTASSIUM: 4.2 mmol/L (ref 3.5–5.3)
Sodium: 142 mmol/L (ref 135–146)
Total Bilirubin: 0.5 mg/dL (ref 0.2–1.2)
Total Protein: 6 g/dL — ABNORMAL LOW (ref 6.1–8.1)

## 2019-02-22 LAB — MYCOBACTERIA,CULT W/FLUOROCHROME SMEAR
MICRO NUMBER:: 119328
SPECIMEN QUALITY:: ADEQUATE

## 2019-02-22 NOTE — Telephone Encounter (Signed)
[facility] called Urgent Lab results on Kiara Medina.  Specimen Information: Blood     Component 69mo ago  MICRO NUMBER: [id number]   SPECIMEN QUALITY: Adequate   Source: SPUTUM   STATUS: FINAL   SMEAR: Rare (1 +) acid-fast bacilli seen using the fluorochrome method.Abnormal    ISOLATE 1: Mycobacterium, avium-intracellulare group Abnormal         [name] RN

## 2019-02-23 DIAGNOSIS — S83231D Complex tear of medial meniscus, current injury, right knee, subsequent encounter: Secondary | ICD-10-CM | POA: Diagnosis not present

## 2019-02-23 DIAGNOSIS — M25561 Pain in right knee: Secondary | ICD-10-CM | POA: Diagnosis not present

## 2019-02-27 DIAGNOSIS — S83231D Complex tear of medial meniscus, current injury, right knee, subsequent encounter: Secondary | ICD-10-CM | POA: Diagnosis not present

## 2019-02-27 DIAGNOSIS — M25561 Pain in right knee: Secondary | ICD-10-CM | POA: Diagnosis not present

## 2019-03-06 DIAGNOSIS — M25561 Pain in right knee: Secondary | ICD-10-CM | POA: Diagnosis not present

## 2019-03-06 DIAGNOSIS — S83231D Complex tear of medial meniscus, current injury, right knee, subsequent encounter: Secondary | ICD-10-CM | POA: Diagnosis not present

## 2019-03-09 DIAGNOSIS — S83231D Complex tear of medial meniscus, current injury, right knee, subsequent encounter: Secondary | ICD-10-CM | POA: Diagnosis not present

## 2019-03-09 DIAGNOSIS — M25561 Pain in right knee: Secondary | ICD-10-CM | POA: Diagnosis not present

## 2019-03-15 ENCOUNTER — Other Ambulatory Visit: Payer: Self-pay

## 2019-03-15 ENCOUNTER — Ambulatory Visit: Payer: Medicare HMO | Admitting: Internal Medicine

## 2019-03-15 ENCOUNTER — Encounter: Payer: Self-pay | Admitting: Internal Medicine

## 2019-03-15 DIAGNOSIS — A310 Pulmonary mycobacterial infection: Secondary | ICD-10-CM | POA: Diagnosis not present

## 2019-03-15 NOTE — Progress Notes (Signed)
[facility] for Infectious Disease  Patient Active Problem List   Diagnosis Date Noted  . 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 BRONCHIECTASIS 05/23/2009    Priority: Medium  . Dysphonia 01/11/2018  . Diarrhea 01/11/2018  . Right shoulder pain 01/21/2017  . Encounter for hepatitis C screening test for low risk patient 01/21/2017  . GERD (gastroesophageal reflux disease) 02/21/2016  . 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 Dyslipidemia 11/14/2014  . Allergic rhinitis 05/03/2009    Patient's Medications  New Prescriptions   No medications on file  Previous Medications   ACYCLOVIR (ZOVIRAX) 400 MG TABLET    Take 1 tablet (400 mg total) by mouth 5 (five) times daily.   ALBUTEROL (PROVENTIL HFA;VENTOLIN HFA) 108 (90 BASE) MCG/ACT INHALER    Inhale 2 puffs into the lungs every 6 (six) hours as needed for wheezing or shortness of breath.   ARIKAYCE 590 MG/8.4ML SUSP    Inhale the contents of one vial (590mg ) via Lamira device 1 time daily as directed.   ATORVASTATIN (LIPITOR) 20 MG TABLET    Take 20 mg by mouth daily.   B COMPLEX VITAMINS (B COMPLEX-B12 PO)    Take 1 tablet by mouth daily.   BIOTIN 5000 PO    Take 2 capsules by mouth daily.   CALCIUM CARBONATE-VITAMIN D (CALCIUM-VITAMIN D3) 600-200 MG-UNIT TABS    Take 1 tablet by mouth 2 (two) times daily.     CHOLECALCIFEROL (VITAMIN D3) 1000 UNITS CAPS    Take 2,000 Units by mouth daily.   CLOFAZIMINE PO    Take 2 tablets by mouth daily.   DOCUSATE CALCIUM (STOOL SOFTENER PO)    Take 100 mg by mouth 2 (two) times daily.    ETHAMBUTOL (MYAMBUTOL) 400 MG TABLET    TAKE 3 TABLETS (1,200 MG TOTAL) BY MOUTH DAILY.   FLAXSEED, LINSEED, (FLAX SEED OIL) 1300 MG CAPS    Take by mouth daily.   GLUCOSAMINE-CHONDROITIN 1500-1200 MG/30ML LIQD    Take 1 tablet by mouth daily.   OMEPRAZOLE (PRILOSEC) 40 MG CAPSULE    TAKE 1 CAPSULE TWICE DAILY   PROBIOTIC PRODUCT (PROBIOTIC DAILY PO)    Take 1  capsule by mouth daily.   RESPIRATORY THERAPY SUPPLIES (FLUTTER) DEVI    Use as directed.   RIFAMPIN (RIFADIN) 300 MG CAPSULE    TAKE 2 CAPSULES (600 MG TOTAL) BY MOUTH DAILY.   SODIUM CHLORIDE HYPERTONIC 3 % NEBULIZER SOLUTION    57mL via nebulizer BID.  DX: J47.9   SPACER/AERO-HOLDING CHAMBERS (AEROCHAMBER MV) INHALER    Use as instructed   TURMERIC PO    Take 2,000 mg by mouth daily.    VITAMIN B-12 (CYANOCOBALAMIN) 1000 MCG TABLET    Take 2,000 mcg by mouth daily.  Modified Medications   No medications on file  Discontinued Medications   No medications on file    Subjective: Kiara Medina is in for her routine follow-up visit.  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  She has been receiving oral ethambutol, rifampin and clofazimine along with aerosolized amikacin.  She has not had any fever, chills or sweats.  She has not noted much improvement or worsening in her chronic cough, sputum production or dyspnea on exertion since starting back on therapy.  She has not had any further episodes of hemoptysis. She recently started on nebulized saline each morning and has noted some improvement  in her wheezing.  Review of Systems: Review of Systems  Constitutional: Positive for malaise/fatigue. Negative for chills, diaphoresis, fever and weight loss.  HENT: Positive for hearing loss and tinnitus.        She has had no change in her hearing loss or tinnitus since starting on amikacin and her audiograms have not revealed any acute changes.  Respiratory: Positive for cough, sputum production, shortness of breath and wheezing. Negative for hemoptysis.   Cardiovascular: Negative for chest pain.  Gastrointestinal: Negative for abdominal pain, diarrhea, nausea and vomiting.  Musculoskeletal: Positive for joint pain.       She is recovering from right knee arthroscopic surgery last month.    Past Medical History:  Diagnosis Date  . Allergic rhinitis, cause  unspecified   . Bronchiectasis without acute exacerbation ([place])   . Cough   . Sinusitis     Social History   Tobacco Use  . Smoking status: Former Smoker    Packs/day: 1.00    Years: 20.00    Pack years: 20.00    Types: Cigarettes    Last attempt to quit: 12/14/1985    Years since quitting: 33.2  . Smokeless tobacco: Never Used  Substance Use Topics  . Alcohol use: Yes    Alcohol/week: 1.0 standard drinks    Types: 1 Glasses of wine per week    Comment: occ  . Drug use: No    Family History  Problem Relation Age of Onset  . Emphysema Sister   . Stroke Mother   . Clotting disorder Mother     No Known Allergies  Objective: Vitals:   03/15/19 1539  Weight: 133 lb (60.3 kg)   Body mass index is 24.33 kg/m.  Physical Exam Constitutional:      Comments: She is in good spirits as usual.  Cardiovascular:     Rate and Rhythm: Normal rate and regular rhythm.     Heart sounds: No murmur.  Pulmonary:     Effort: Pulmonary effort is normal.     Breath sounds: Normal breath sounds. No wheezing or rales.  Psychiatric:        Mood and Affect: Mood normal.     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  However, it is quite possible that she would have gotten much worse over the past year if she had not been on her current antibiotic regimen.  She is tolerating the aerosolized amikacin better now.  She prefers to continue on her current regimen for now.  She will follow-up in 2 months.      Relevant Orders   MYCOBACTERIA, CULTURE, WITH FLUOROCHROME SMEAR       Michel Bickers, MD [facility] for [place] [facility] [phone] pager   [phone] cell 03/15/2019, 3:56 PM

## 2019-03-15 NOTE — Assessment & Plan Note (Signed)
Her sputum culture from late January remained positive for Mycobacterium avium.  However, it is quite possible that she would have gotten much worse over the past year if she had not been on her current antibiotic regimen.  She is tolerating the aerosolized amikacin better now.  She prefers to continue on her current regimen for now.  She will follow-up in 2 months.

## 2019-03-20 DIAGNOSIS — A310 Pulmonary mycobacterial infection: Secondary | ICD-10-CM | POA: Diagnosis not present

## 2019-03-20 DIAGNOSIS — J479 Bronchiectasis, uncomplicated: Secondary | ICD-10-CM | POA: Diagnosis not present

## 2019-03-28 ENCOUNTER — Telehealth: Payer: Self-pay | Admitting: Behavioral Health

## 2019-03-28 NOTE — Telephone Encounter (Signed)
[facility] call Urgent lab results to Dr. Megan Salon.  Preliminary results available in chart fot Dr. Megan Salon to review.  Dx:  Mycobacterium avium-intracellulare co...  Specimen Information: Expectorated Sputum     Component 13d ago  MICRO NUMBER: [id number] P  SPECIMEN QUALITY: Adequate P  Source: SPUTUM P  STATUS: PRELIMINARY P  SMEAR: Rare (1 +) acid-fast bacilli seen using the fluorochrome method.Abnormal  P  ISOLATE 1: Acid-fast bacillusAbnormal  P  Comment: Acid-fast bacilli isolated. Identification is in progress.       [name] RN

## 2019-03-30 NOTE — Telephone Encounter (Signed)
[facility] called to report urgent preliminary lab results.  No changes to previous culture that was called in 03/28/2019.  See Result for 03/28/2019 in previous note.  Also results in lab tab in epic.  [name] RN

## 2019-04-19 DIAGNOSIS — A310 Pulmonary mycobacterial infection: Secondary | ICD-10-CM | POA: Diagnosis not present

## 2019-04-19 DIAGNOSIS — J479 Bronchiectasis, uncomplicated: Secondary | ICD-10-CM | POA: Diagnosis not present

## 2019-04-24 ENCOUNTER — Telehealth: Payer: Self-pay | Admitting: Pharmacist

## 2019-04-24 NOTE — Telephone Encounter (Signed)
Sent in refill application for clofazimine. Medication should arrive to clinic in 7-10 business days. Will update encounter when medication arrives.

## 2019-05-02 DIAGNOSIS — H6122 Impacted cerumen, left ear: Secondary | ICD-10-CM | POA: Diagnosis not present

## 2019-05-02 DIAGNOSIS — H9313 Tinnitus, bilateral: Secondary | ICD-10-CM | POA: Diagnosis not present

## 2019-05-03 ENCOUNTER — Telehealth: Payer: Self-pay | Admitting: Pulmonary Disease

## 2019-05-03 ENCOUNTER — Telehealth: Payer: Self-pay | Admitting: Pharmacist

## 2019-05-03 LAB — MYCOBACTERIA,CULT W/FLUOROCHROME SMEAR
MICRO NUMBER:: 370788
SPECIMEN QUALITY:: ADEQUATE

## 2019-05-03 LAB — M. AVIUM MIC PANEL
AMIKACIN: >64
CIPROFLOXACIN: 8
CLARITHROMYCIN: >64
ETHAMBUTOL: >16
ETHIONAMIDE: 1.2
ISONIAZID: 4
LINEZOLID: 16
MOXIFLOXACIN: 2
RIFABUTIN: <=0.25
RIFAMPIN: >8
STREPTOMYCIN: 16

## 2019-05-03 NOTE — Telephone Encounter (Signed)
I have 2 bottles of clofazimine in the pharmacy office for patient when she needs a refill.

## 2019-05-03 NOTE — Telephone Encounter (Signed)
Spoke with pt, she states there is a sticker on her nebulizer machine advising her to replace the machine in the month of June. The machine is not broken she has only had the nebulizer machine for 6 months. I called [employer] to speak to someone about the matter but received a voicemail box from [place]. I left a detailed message for her to call me back to discuss.

## 2019-05-04 NOTE — Telephone Encounter (Signed)
Looks like encounter was open in error so closing encounter. 

## 2019-05-04 NOTE — Telephone Encounter (Signed)
Called [employer] and spoke with Marissa in regards to the message received from pt about a sticker being on her nebulizer stating to replace the machine in the month of June but stated to [facility] that pt has only had nebulizer for 6 months. Per Mable Fill, pt can just disregard the sticker that is on the machine. Pt will not be eligible for a new nebulizer until 2024.  Called and spoke with pt letting her know that I had spoken with [employer] and they said that she can disregard the sticker that is on her nebulizer where it stated for her to have machine replaced in June. Pt expressed understanding. Nothing further needed.

## 2019-05-17 ENCOUNTER — Encounter: Payer: Self-pay | Admitting: Internal Medicine

## 2019-05-17 ENCOUNTER — Ambulatory Visit
Admission: RE | Admit: 2019-05-17 | Discharge: 2019-05-17 | Disposition: A | Discharge status: Home / Self Care | Payer: Medicare HMO | Source: Ambulatory Visit | Attending: Internal Medicine | Admitting: Internal Medicine

## 2019-05-17 ENCOUNTER — Ambulatory Visit (INDEPENDENT_AMBULATORY_CARE_PROVIDER_SITE_OTHER): Payer: Medicare HMO | Admitting: Internal Medicine

## 2019-05-17 ENCOUNTER — Other Ambulatory Visit: Payer: Self-pay

## 2019-05-17 VITALS — BP 131/69 | HR 88 | Temp 98.2°F | Ht 63.5 in | Wt 129.0 lb

## 2019-05-17 DIAGNOSIS — A310 Pulmonary mycobacterial infection: Secondary | ICD-10-CM

## 2019-05-17 DIAGNOSIS — R634 Abnormal weight loss: Secondary | ICD-10-CM | POA: Insufficient documentation

## 2019-05-17 DIAGNOSIS — R05 Cough: Secondary | ICD-10-CM | POA: Diagnosis not present

## 2019-05-17 DIAGNOSIS — R0602 Shortness of breath: Secondary | ICD-10-CM | POA: Diagnosis not present

## 2019-05-17 DIAGNOSIS — M25511 Pain in right shoulder: Secondary | ICD-10-CM | POA: Diagnosis not present

## 2019-05-17 DIAGNOSIS — Z1231 Encounter for screening mammogram for malignant neoplasm of breast: Secondary | ICD-10-CM | POA: Diagnosis not present

## 2019-05-17 NOTE — Assessment & Plan Note (Signed)
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  She has been consistently culture positive despite her 4 drug regimen.  I do not know of any other salvage regimens to try.  Her infection is not at all likely to ever be cured but hopefully we can keep it at [place] with treatment.  I will check routine lab work and a repeat chest x-ray today.  She will follow-up in 2 months.

## 2019-05-17 NOTE — Progress Notes (Signed)
[facility] for Infectious Disease  Patient Active Problem List   Diagnosis Date Noted  . 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 BRONCHIECTASIS 05/23/2009    Priority: Medium  . Unintentional weight loss 05/17/2019  . Dysphonia 01/11/2018  . Diarrhea 01/11/2018  . Right shoulder pain 01/21/2017  . Encounter for hepatitis C screening test for low risk patient 01/21/2017  . GERD (gastroesophageal reflux disease) 02/21/2016  . 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 Dyslipidemia 11/14/2014  . Allergic rhinitis 05/03/2009    Patient's Medications  New Prescriptions   No medications on file  Previous Medications   ACYCLOVIR (ZOVIRAX) 400 MG TABLET    Take 1 tablet (400 mg total) by mouth 5 (five) times daily.   ALBUTEROL (PROVENTIL HFA;VENTOLIN HFA) 108 (90 BASE) MCG/ACT INHALER    Inhale 2 puffs into the lungs every 6 (six) hours as needed for wheezing or shortness of breath.   ARIKAYCE 590 MG/8.4ML SUSP    Inhale the contents of one vial (590mg ) via Lamira device 1 time daily as directed.   ATORVASTATIN (LIPITOR) 20 MG TABLET    Take 20 mg by mouth daily.   B COMPLEX VITAMINS (B COMPLEX-B12 PO)    Take 1 tablet by mouth daily.   BENZONATATE (TESSALON) 100 MG CAPSULE    benzonatate 100 mg capsule   BIOTIN 5000 PO    Take 2 capsules by mouth daily.   CALCIUM CARBONATE-VITAMIN D (CALCIUM-VITAMIN D3) 600-200 MG-UNIT TABS    Take 1 tablet by mouth 2 (two) times daily.     CETIRIZINE (ZYRTEC) 10 MG TABLET    every morning.   CHOLECALCIFEROL (VITAMIN D3) 1000 UNITS CAPS    Take 2,000 Units by mouth daily.   CLOFAZIMINE PO    Take 2 tablets by mouth daily.   ETHAMBUTOL (MYAMBUTOL) 400 MG TABLET    TAKE 3 TABLETS (1,200 MG TOTAL) BY MOUTH DAILY.   FLAXSEED, LINSEED, (FLAX SEED OIL) 1300 MG CAPS    Take by mouth daily.   GLUCOSAMINE-CHONDROITIN 1500-1200 MG/30ML LIQD    Take 1 tablet by mouth daily.   HYDROCORTISONE (PROCTOZONE-HC) 2.5 %  RECTAL CREAM    Proctozone-HC 2.5 % topical cream perineal applicator   MELATONIN 10 MG CAPS    10 mg nightly as needed.   OMEPRAZOLE (PRILOSEC) 40 MG CAPSULE    TAKE 1 CAPSULE TWICE DAILY   PROBIOTIC PRODUCT (PROBIOTIC DAILY PO)    Take 1 capsule by mouth daily.   RESPIRATORY THERAPY SUPPLIES (FLUTTER) DEVI    Use as directed.   RIFAMPIN (RIFADIN) 300 MG CAPSULE    TAKE 2 CAPSULES (600 MG TOTAL) BY MOUTH DAILY.   SODIUM CHLORIDE HYPERTONIC 3 % NEBULIZER SOLUTION    44mL via nebulizer BID.  DX: J47.9   SPACER/AERO-HOLDING CHAMBERS (AEROCHAMBER MV) INHALER    Use as instructed   TURMERIC PO    Take 2,000 mg by mouth daily.    VITAMIN B-12 (CYANOCOBALAMIN) 1000 MCG TABLET    Take 2,000 mcg by mouth daily.  Modified Medications   No medications on file  Discontinued Medications   DOCUSATE CALCIUM (STOOL SOFTENER PO)    Take 100 mg by mouth 2 (two) times daily.     Subjective: Kiara Medina is in for her routine follow-up visit.  She is now completed 17 months of therapy for relapsed Mycobacterium avium pneumonia.  She has been receiving oral ethambutol, rifampin and clofazimine along with aerosolized  amikacin.  She has not had any fever, chills or sweats.  She is concerned about recent weight loss.  She does not think that she has changed her diet and she feels like her appetite is good.  She is not having any nausea or vomiting.  She notes some intermittent soft stools but no diarrhea.  She feels like her shortness of breath has been a little worse recently.  Her intermittent hacking cough remains unchanged.  She is bringing up phlegm almost every day.  She has not had any more hemoptysis.  Review of Systems: Review of Systems  Constitutional: Positive for malaise/fatigue and weight loss. Negative for chills, diaphoresis and fever.  HENT: Positive for hearing loss and tinnitus.        She has had no change in her hearing loss or tinnitus since starting on amikacin and her audiograms have not revealed any  acute changes.  Respiratory: Positive for cough, sputum production, shortness of breath and wheezing. Negative for hemoptysis.   Cardiovascular: Negative for chest pain.  Gastrointestinal: Negative for abdominal pain, diarrhea, nausea and vomiting.  Musculoskeletal: Positive for joint pain.       She is recovering from right knee arthroscopic surgery last month.    Past Medical History:  Diagnosis Date  . Allergic rhinitis, cause unspecified   . Bronchiectasis without acute exacerbation ([place])   . Cough   . Sinusitis     Social History   Tobacco Use  . Smoking status: Former Smoker    Packs/day: 1.00    Years: 20.00    Pack years: 20.00    Types: Cigarettes    Last attempt to quit: 12/14/1985    Years since quitting: 33.4  . Smokeless tobacco: Never Used  Substance Use Topics  . Alcohol use: Yes    Alcohol/week: 1.0 standard drinks    Types: 1 Glasses of wine per week    Comment: occ  . Drug use: No    Family History  Problem Relation Age of Onset  . Emphysema Sister   . Stroke Mother   . Clotting disorder Mother     No Known Allergies  Objective: Vitals:   05/17/19 1105  BP: 131/69  Pulse: 88  Temp: 98.2 F (36.8 C)  TempSrc: Oral  SpO2: 94%  Weight: 129 lb (58.5 kg)  Height: 5' 3.5" (1.613 m)   Body mass index is 22.49 kg/m.  Physical Exam Constitutional:      Comments: She is in good spirits as usual.  She has lost 6 pounds over the past year.  Cardiovascular:     Rate and Rhythm: Normal rate and regular rhythm.     Heart sounds: No murmur.  Pulmonary:     Effort: Pulmonary effort is normal.     Breath sounds: Wheezing present. No rales.  Psychiatric:        Mood and Affect: Mood normal.     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  She has been consistently culture  positive despite her 4 drug regimen.  I do not know of any other salvage regimens to try.  Her infection is not at all likely to ever be cured but hopefully we can keep it at [place] with treatment.  I will check routine lab work and a repeat chest x-ray today.  She will follow-up in 2 months.      Relevant Orders    MYCOBACTERIA, CULTURE, WITH FLUOROCHROME SMEAR   CBC   Comprehensive metabolic panel   DG Chest 2 View     Unprioritized   Unintentional weight loss    The cause of her unintentional weight loss is not obvious yet.  I will check lab work and a chest x-ray today and see her back in 2 months.      Right shoulder pain    She has some right shoulder and subscapular pain when coughing.  I suspect that this is due to muscle strain.          Michel Bickers, MD [facility] for [facility] [place] [facility] [phone] pager   [phone] cell 05/17/2019, 11:43 AM

## 2019-05-17 NOTE — Assessment & Plan Note (Signed)
She has some right shoulder and subscapular pain when coughing.  I suspect that this is due to muscle strain.

## 2019-05-17 NOTE — Assessment & Plan Note (Signed)
The cause of her unintentional weight loss is not obvious yet.  I will check lab work and a chest x-ray today and see her back in 2 months.

## 2019-05-17 NOTE — Telephone Encounter (Signed)
Gave 2 bottles to patient today 6/3.

## 2019-05-18 LAB — CBC
HCT: 43.7 % (ref 35.0–45.0)
Hemoglobin: 15.1 g/dL (ref 11.7–15.5)
MCH: 32.3 pg (ref 27.0–33.0)
MCHC: 34.6 g/dL (ref 32.0–36.0)
MCV: 93.4 fL (ref 80.0–100.0)
MPV: 11.7 fL (ref 7.5–12.5)
Platelets: 157 10*3/uL (ref 140–400)
RBC: 4.68 10*6/uL (ref 3.80–5.10)
RDW: 13.4 % (ref 11.0–15.0)
WBC: 6.8 10*3/uL (ref 3.8–10.8)

## 2019-05-18 LAB — COMPREHENSIVE METABOLIC PANEL
AG Ratio: 1.8 (calc) (ref 1.0–2.5)
ALT: 15 U/L (ref 6–29)
AST: 22 U/L (ref 10–35)
Albumin: 4.1 g/dL (ref 3.6–5.1)
Alkaline phosphatase (APISO): 68 U/L (ref 37–153)
BUN: 16 mg/dL (ref 7–25)
CO2: 29 mmol/L (ref 20–32)
Calcium: 9.5 mg/dL (ref 8.6–10.4)
Chloride: 103 mmol/L (ref 98–110)
Creat: 0.81 mg/dL (ref 0.60–0.93)
Globulin: 2.3 g/dL (calc) (ref 1.9–3.7)
Glucose, Bld: 87 mg/dL (ref 65–99)
Potassium: 4 mmol/L (ref 3.5–5.3)
Sodium: 141 mmol/L (ref 135–146)
Total Bilirubin: 0.7 mg/dL (ref 0.2–1.2)
Total Protein: 6.4 g/dL (ref 6.1–8.1)

## 2019-05-20 DIAGNOSIS — A310 Pulmonary mycobacterial infection: Secondary | ICD-10-CM | POA: Diagnosis not present

## 2019-05-20 DIAGNOSIS — J479 Bronchiectasis, uncomplicated: Secondary | ICD-10-CM | POA: Diagnosis not present

## 2019-05-22 ENCOUNTER — Ambulatory Visit: Payer: Medicare HMO | Admitting: Internal Medicine

## 2019-05-22 ENCOUNTER — Telehealth: Payer: Self-pay

## 2019-05-22 ENCOUNTER — Telehealth: Payer: Self-pay | Admitting: *Deleted

## 2019-05-22 NOTE — Telephone Encounter (Signed)
Patient left message in triage asking for chest xray results. Please advise. Landis Gandy, RN

## 2019-05-22 NOTE — Telephone Encounter (Signed)
Please let her know that her chest x-ray showed stable chronic changes without any new abnormalities.  She should be able to see that report in [place].

## 2019-05-22 NOTE — Telephone Encounter (Signed)
Calling regarding chest xray results completed on 05-17-2019.    Please advise.   Laverle Patter, RN

## 2019-05-22 NOTE — Telephone Encounter (Signed)
I responded to Michelle's phone note earlier today and asked her to call Kiara Medina and give her the results.

## 2019-05-22 NOTE — Telephone Encounter (Signed)
Relayed to patient. She went to refill her tessalon pearles, but these are no longer covered by her insurance.  She will look at [email or web address] to get a better price.

## 2019-05-25 ENCOUNTER — Other Ambulatory Visit: Payer: Self-pay | Admitting: Internal Medicine

## 2019-05-25 DIAGNOSIS — A310 Pulmonary mycobacterial infection: Secondary | ICD-10-CM

## 2019-05-31 ENCOUNTER — Telehealth: Payer: Self-pay

## 2019-05-31 NOTE — Telephone Encounter (Signed)
MYCOBACTERIA, CULTURE, WITH  FLUOROCHROME SMEAR  Specimen Information: Sputum     Component 2wk ago  MICRO NUMBER: [id number] P   SPECIMEN QUALITY: Adequate P   Source: SPUTUM P   STATUS: PRELIMINARY P   SMEAR: Rare (1 +) acid-fast bacilli seen using the fluorochrome method.Abnormal  P   ISOLATE 1: acid-fast bacillus Abnormal  P   Comment: Acid-fast bacilli present in liquid culture media. Identification to follow.       [facility] called with critical lab results for Dr. Megan Salon. Will route message to MD  Aundria Rud, [place]

## 2019-06-15 DIAGNOSIS — E559 Vitamin D deficiency, unspecified: Secondary | ICD-10-CM | POA: Diagnosis not present

## 2019-06-15 DIAGNOSIS — R7301 Impaired fasting glucose: Secondary | ICD-10-CM | POA: Diagnosis not present

## 2019-06-15 DIAGNOSIS — E785 Hyperlipidemia, unspecified: Secondary | ICD-10-CM | POA: Diagnosis not present

## 2019-06-15 DIAGNOSIS — A310 Pulmonary mycobacterial infection: Secondary | ICD-10-CM | POA: Diagnosis not present

## 2019-06-15 DIAGNOSIS — K219 Gastro-esophageal reflux disease without esophagitis: Secondary | ICD-10-CM | POA: Diagnosis not present

## 2019-06-15 DIAGNOSIS — J479 Bronchiectasis, uncomplicated: Secondary | ICD-10-CM | POA: Diagnosis not present

## 2019-06-19 DIAGNOSIS — A310 Pulmonary mycobacterial infection: Secondary | ICD-10-CM | POA: Diagnosis not present

## 2019-06-19 DIAGNOSIS — J479 Bronchiectasis, uncomplicated: Secondary | ICD-10-CM | POA: Diagnosis not present

## 2019-06-23 DIAGNOSIS — H26491 Other secondary cataract, right eye: Secondary | ICD-10-CM | POA: Diagnosis not present

## 2019-06-28 LAB — MYCOBACTERIA,CULT W/FLUOROCHROME SMEAR
MICRO NUMBER:: 533590
SPECIMEN QUALITY:: ADEQUATE

## 2019-07-01 ENCOUNTER — Other Ambulatory Visit: Payer: Self-pay | Admitting: Pulmonary Disease

## 2019-07-17 ENCOUNTER — Ambulatory Visit: Payer: Medicare HMO | Admitting: Internal Medicine

## 2019-07-20 DIAGNOSIS — J479 Bronchiectasis, uncomplicated: Secondary | ICD-10-CM | POA: Diagnosis not present

## 2019-07-20 DIAGNOSIS — A310 Pulmonary mycobacterial infection: Secondary | ICD-10-CM | POA: Diagnosis not present

## 2019-07-24 ENCOUNTER — Ambulatory Visit (INDEPENDENT_AMBULATORY_CARE_PROVIDER_SITE_OTHER): Payer: Medicare HMO | Admitting: Internal Medicine

## 2019-07-24 ENCOUNTER — Encounter: Payer: Self-pay | Admitting: Internal Medicine

## 2019-07-24 ENCOUNTER — Other Ambulatory Visit: Payer: Self-pay

## 2019-07-24 ENCOUNTER — Encounter: Payer: Self-pay | Admitting: Primary Care

## 2019-07-24 ENCOUNTER — Ambulatory Visit: Payer: Medicare HMO | Admitting: Primary Care

## 2019-07-24 ENCOUNTER — Ambulatory Visit: Payer: Medicare HMO | Admitting: Nurse Practitioner

## 2019-07-24 ENCOUNTER — Ambulatory Visit: Payer: Medicare HMO | Admitting: Adult Health

## 2019-07-24 DIAGNOSIS — J479 Bronchiectasis, uncomplicated: Secondary | ICD-10-CM

## 2019-07-24 DIAGNOSIS — A310 Pulmonary mycobacterial infection: Secondary | ICD-10-CM

## 2019-07-24 NOTE — Assessment & Plan Note (Addendum)
-   Symptoms appear mostly stable; morning congestion and cough worse at night - Encouraged patient use hypertonic saline twice daily - If continues to have chest congestion recommend trial therapy vest  - FU in 6 months with new [facility] pulmonary provider (previous [facility] patient)

## 2019-07-24 NOTE — Progress Notes (Signed)
[facility]  Patient Active Problem List   Diagnosis Date Noted  . 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 BRONCHIECTASIS 05/23/2009    Priority: Medium  . Unintentional weight loss 05/17/2019  . Dysphonia 01/11/2018  . Diarrhea 01/11/2018  . Right shoulder pain 01/21/2017  . Encounter for hepatitis C screening test for low risk patient 01/21/2017  . GERD (gastroesophageal reflux disease) 02/21/2016  . 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 Dyslipidemia 11/14/2014  . Allergic rhinitis 05/03/2009    Patient's Medications  New Prescriptions   No medications on file  Previous Medications   ACYCLOVIR (ZOVIRAX) 400 MG TABLET    Take 1 tablet (400 mg total) by mouth 5 (five) times daily.   ALBUTEROL (PROVENTIL HFA;VENTOLIN HFA) 108 (90 BASE) MCG/ACT INHALER    Inhale 2 puffs into the lungs every 6 (six) hours as needed for wheezing or shortness of breath.   ARIKAYCE 590 MG/8.4ML SUSP    Inhale the contents of one vial (590mg ) via Lamira device 1 time daily as directed.   ATORVASTATIN (LIPITOR) 20 MG TABLET    Take 20 mg by mouth daily.   B COMPLEX VITAMINS (B COMPLEX-B12 PO)    Take 1 tablet by mouth daily.   BENZONATATE (TESSALON) 100 MG CAPSULE    benzonatate 100 mg capsule   BIOTIN 5000 PO    Take 2 capsules by mouth daily.   CALCIUM CARBONATE-VITAMIN D (CALCIUM-VITAMIN D3) 600-200 MG-UNIT TABS    Take 1 tablet by mouth 2 (two) times daily.     CETIRIZINE (ZYRTEC) 10 MG TABLET    every morning.   CHOLECALCIFEROL (VITAMIN D3) 1000 UNITS CAPS    Take 2,000 Units by mouth daily.   CLOFAZIMINE PO    Take 2 tablets by mouth daily.   ETHAMBUTOL (MYAMBUTOL) 400 MG TABLET    TAKE 3 TABLETS (1,200 MG TOTAL) BY MOUTH DAILY.   FLAXSEED, LINSEED, (FLAX SEED OIL) 1300 MG CAPS    Take by mouth daily.   GLUCOSAMINE-CHONDROITIN 1500-1200 MG/30ML LIQD    Take 1 tablet by mouth daily.   HYDROCORTISONE (PROCTOZONE-HC) 2.5 %  RECTAL CREAM    Proctozone-HC 2.5 % topical cream perineal applicator   MELATONIN 10 MG CAPS    10 mg nightly as needed.   OMEPRAZOLE (PRILOSEC) 40 MG CAPSULE    TAKE 1 CAPSULE TWICE DAILY   PROBIOTIC PRODUCT (PROBIOTIC DAILY PO)    Take 1 capsule by mouth daily.   RESPIRATORY THERAPY SUPPLIES (FLUTTER) DEVI    Use as directed.   RIFAMPIN (RIFADIN) 300 MG CAPSULE    TAKE 2 CAPSULES EVERY DAY   SODIUM CHLORIDE HYPERTONIC 3 % NEBULIZER SOLUTION    63mL via nebulizer BID.  DX: J47.9   SPACER/AERO-HOLDING CHAMBERS (AEROCHAMBER MV) INHALER    Use as instructed   TURMERIC PO    Take 2,000 mg by mouth daily.    VITAMIN B-12 (CYANOCOBALAMIN) 1000 MCG TABLET    Take 2,000 mcg by mouth daily.  Modified Medications   No medications on file  Discontinued Medications   No medications on file    Subjective: Kiara Medina is in for her routine follow-up visit.  She is now completed 19 months of therapy for relapsed, antibiotic resistant Mycobacterium avium pneumonia.  She has been receiving oral ethambutol, rifampin and clofazimine along with aerosolized amikacin.  She feels like her shortness of breath has been a little worse recently.  Her intermittent hacking cough remains unchanged.  She is bringing up phlegm almost every day.  She has not had any more hemoptysis.  Review of Systems: Review of Systems  Constitutional: Positive for malaise/fatigue. Negative for chills, diaphoresis, fever and weight loss.  HENT: Positive for hearing loss and tinnitus.        She has had no change in her hearing loss or tinnitus since starting on amikacin and her audiograms have not revealed any acute changes.  Respiratory: Positive for cough, sputum production, shortness of breath and wheezing. Negative for hemoptysis.   Cardiovascular: Negative for chest pain.  Gastrointestinal: Negative for abdominal pain, diarrhea, nausea and vomiting.  Musculoskeletal: Positive for joint pain.       She is recovering from right knee  arthroscopic surgery last month.    Past Medical History:  Diagnosis Date  . Allergic rhinitis, cause unspecified   . Bronchiectasis without acute exacerbation ([place])   . Cough   . Sinusitis     Social History   Tobacco Use  . Smoking status: Former Smoker    Packs/day: 1.00    Years: 20.00    Pack years: 20.00    Types: Cigarettes    Quit date: 12/14/1985    Years since quitting: 33.6  . Smokeless tobacco: Never Used  Substance Use Topics  . Alcohol use: Yes    Alcohol/week: 1.0 standard drinks    Types: 1 Glasses of wine per week    Comment: occ  . Drug use: No    Family History  Problem Relation Age of Onset  . Emphysema Sister   . Stroke Mother   . Clotting disorder Mother     No Known Allergies  Objective: Vitals:   07/24/19 1519  BP: 119/78  Pulse: 91  Temp: 98 F (36.7 C)   There is no height or weight on file to calculate BMI.  Physical Exam Constitutional:      Comments: She is in good spirits as usual.  Her weight is unchanged from her last visit.  Cardiovascular:     Rate and Rhythm: Normal rate and regular rhythm.     Heart sounds: No murmur.  Pulmonary:     Effort: Pulmonary effort is normal.     Breath sounds: No wheezing, rhonchi or rales.  Psychiatric:        Mood and Affect: Mood normal.     Lab Results CMP     Component Value Date/Time   NA 141 05/17/2019 1144   K 4.0 05/17/2019 1144   CL 103 05/17/2019 1144   CO2 29 05/17/2019 1144   GLUCOSE 87 05/17/2019 1144   BUN 16 05/17/2019 1144   CREATININE 0.81 05/17/2019 1144   CALCIUM 9.5 05/17/2019 1144   PROT 6.4 05/17/2019 1144   ALBUMIN 3.8 04/23/2015 1057   AST 22 05/17/2019 1144   ALT 15 05/17/2019 1144   ALKPHOS 55 04/23/2015 1057   BILITOT 0.7 05/17/2019 1144   GFRNONAA >60 06/01/2011 1457   GFRAA >60 06/01/2011 1457   Lab Results  Component Value Date   WBC 6.8 05/17/2019   HGB 15.1 05/17/2019   HCT 43.7 05/17/2019   MCV 93.4 05/17/2019   PLT 157 05/17/2019    Sputum AFB culture 05/17/2019; + for Mycobacterium avium again  Chest x-ray 05/17/2019 IMPRESSION: Stable chronic lung disease.  No acute cardiopulmonary abnormality.    By: Genevie Ann M.D.   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  I suspect that she would be much worse if she were not on her current 4 drug antibiotic regimen but the only way to know for sure would be to have her stop it.  I talked her about options today including stopping all antibiotics, continuing her oral antibiotics and stopping aerosolized amikacin, or continuing on with all 4 current antibiotics.  She elects to continue her current regimen and reconsider in about 4 months.          Michel Bickers, MD [facility] [phone] pager   [phone] cell 07/24/2019, 3:47 PM

## 2019-07-24 NOTE — Assessment & Plan Note (Addendum)
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

## 2019-07-24 NOTE — Progress Notes (Signed)
_0  ID: Truitt Merle, female    DOB: March 01, 1946, 73 y.o.   MRN: [id number]  Chief Complaint  Patient presents with  . Follow-up    Referring provider: Nicoletta Dress, MD   Synopsis: Former patient of Dr. Gwenette Greet who has MAI.  Has been treated since December 2015 as guided by Dr. Megan Salon and infectious diseases. She had bronchiectasis seen on a chest CT in 2011. 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 In 2015 she did produce mucus which grew nocardia and Mycobacterium avium complex. She was started on 3 drug therapy.  HPI: 29 female, former smoker quit 1987 (25 pack year hx). PMH bronchiectasis, MAI, allergic rhinitis, GERD. Patient of Dr. Lake Bells, last seen on 01/17/19. She is also followed by Dr. Megan Salon with ID. Maintained on hypertonic saline nebs twice daily, consider therapy vest if chest congestion worsens.   07/24/2019 Patient presents today for 6 month follow-up. 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 She so far has completed 18 months treatment with ethambutol, rifampin and clofazimine and aerosolized amikacin. Per ID, her infection is not likely to be cured but plan to continue therapy and keep symptoms stable.   No significant changes. Feels well, symptoms can change depending on weather or time of day. Cough seems to be worse at night. She is still using hypertonic saline neb once daily in the morning. She gets up some mucus, no color. Mostly white or tan. Streaks of blood but doesn't happen every day. Eating and drinking ok, eats protein bar in the morning and likes eggs. Active when its not so hot.    No Known Allergies  Immunization History  Administered Date(s) Administered  . Influenza Split 09/13/2012, 09/06/2014  . Influenza Whole 09/09/2009, 08/14/2010, 09/14/2011  . Influenza, High Dose Seasonal PF 10/10/2015  . Influenza,inj,Quad PF,6+ Mos 09/13/2013,  09/21/2016, 08/26/2017, 08/22/2018  . Pneumococcal Conjugate-13 11/24/2014  . Pneumococcal Polysaccharide-23 07/17/2009    Past Medical History:  Diagnosis Date  . Allergic rhinitis, cause unspecified   . Bronchiectasis without acute exacerbation ([place])   . Cough   . Sinusitis     Tobacco History: Social History   Tobacco Use  Smoking Status Former Smoker  . Packs/day: 1.00  . Years: 20.00  . Pack years: 20.00  . Types: Cigarettes  . Quit date: 12/14/1985  . Years since quitting: 33.6  Smokeless Tobacco Never Used   Counseling given: Not Answered   Outpatient Medications Prior to Visit  Medication Sig Dispense Refill  . acyclovir (ZOVIRAX) 400 MG tablet Take 1 tablet (400 mg total) by mouth 5 (five) times daily. (Patient taking differently: Take 400 mg by mouth 2 (two) times daily. )    . albuterol (PROVENTIL HFA;VENTOLIN HFA) 108 (90 Base) MCG/ACT inhaler Inhale 2 puffs into the lungs every 6 (six) hours as needed for wheezing or shortness of breath. 1 Inhaler 0  . ARIKAYCE 590 MG/8.4ML SUSP Inhale the contents of one vial (568m) via Lamira device 1 time daily as directed. 28 vial 10  . atorvastatin (LIPITOR) 20 MG tablet Take 20 mg by mouth daily.    . B Complex Vitamins (B COMPLEX-B12 PO) Take 1 tablet by mouth daily.    . benzonatate (TESSALON) 100 MG capsule benzonatate 100 mg capsule    . BIOTIN 5000 PO Take 2 capsules by mouth daily.    . Calcium Carbonate-Vitamin D (CALCIUM-VITAMIN D3) 600-200 MG-UNIT TABS Take 1 tablet by  mouth 2 (two) times daily.      . cetirizine (ZYRTEC) 10 MG tablet every morning.    . Cholecalciferol (VITAMIN D3) 1000 UNITS CAPS Take 2,000 Units by mouth daily.    . CLOFAZIMINE PO Take 2 tablets by mouth daily.    Marland Kitchen ethambutol (MYAMBUTOL) 400 MG tablet TAKE 3 TABLETS (1,200 MG TOTAL) BY MOUTH DAILY. 270 tablet 0  . Flaxseed, Linseed, (FLAX SEED OIL) 1300 MG CAPS Take by mouth daily.    . Glucosamine-Chondroitin 1500-1200 MG/30ML LIQD Take 1  tablet by mouth daily.    . hydrocortisone (PROCTOZONE-HC) 2.5 % rectal cream Proctozone-HC 2.5 % topical cream perineal applicator    . Melatonin 10 MG CAPS 10 mg nightly as needed.    Marland Kitchen omeprazole (PRILOSEC) 40 MG capsule TAKE 1 CAPSULE TWICE DAILY 180 capsule 0  . Probiotic Product (PROBIOTIC DAILY PO) Take 1 capsule by mouth daily.    Marland Kitchen Respiratory Therapy Supplies (FLUTTER) DEVI Use as directed. 1 each 0  . rifampin (RIFADIN) 300 MG capsule TAKE 2 CAPSULES EVERY DAY 180 capsule 0  . sodium chloride HYPERTONIC 3 % nebulizer solution 62m via nebulizer BID.  DX: J47.9 720 mL 3  . Spacer/Aero-Holding Chambers (AEROCHAMBER MV) inhaler Use as instructed 1 each 2  . TURMERIC PO Take 2,000 mg by mouth daily.     . vitamin B-12 (CYANOCOBALAMIN) 1000 MCG tablet Take 2,000 mcg by mouth daily.     No facility-administered medications prior to visit.     Review of Systems  Review of Systems  Constitutional: Negative.   HENT: Negative.   Respiratory: Positive for cough and shortness of breath. Negative for wheezing.   Cardiovascular: Negative.     Physical Exam  BP (!) 114/56 (BP Location: Right Arm, Cuff Size: Normal)   Pulse 93   Temp 97.6 F (36.4 C) (Oral)   Ht 5' 3.5" (1.613 m)   Wt 129 lb (58.5 kg)   SpO2 94%   BMI 22.49 kg/m  Physical Exam Constitutional:      Appearance: Normal appearance.  Neck:     Musculoskeletal: Normal range of motion and neck supple.  Cardiovascular:     Rate and Rhythm: Normal rate and regular rhythm.  Pulmonary:     Effort: Pulmonary effort is normal. No respiratory distress.     Breath sounds: Normal breath sounds.  Skin:    General: Skin is warm and dry.  Neurological:     General: No focal deficit present.     Mental Status: She is alert and oriented to person, place, and time. Mental status is at baseline.  Psychiatric:        Mood and Affect: Mood normal.        Behavior: Behavior normal.        Thought Content: Thought content normal.         Judgment: Judgment normal.      Lab Results:  CBC    Component Value Date/Time   WBC 6.8 05/17/2019 1144   RBC 4.68 05/17/2019 1144   HGB 15.1 05/17/2019 1144   HCT 43.7 05/17/2019 1144   PLT 157 05/17/2019 1144   MCV 93.4 05/17/2019 1144   MCH 32.3 05/17/2019 1144   MCHC 34.6 05/17/2019 1144   RDW 13.4 05/17/2019 1144   LYMPHSABS 1,929 01/10/2019 1657   EOSABS 289 01/10/2019 1657   BASOSABS 18 01/10/2019 1657    BMET    Component Value Date/Time   NA 141 05/17/2019 1144  K 4.0 05/17/2019 1144   CL 103 05/17/2019 1144   CO2 29 05/17/2019 1144   GLUCOSE 87 05/17/2019 1144   BUN 16 05/17/2019 1144   CREATININE 0.81 05/17/2019 1144   CALCIUM 9.5 05/17/2019 1144   GFRNONAA >60 06/01/2011 1457   GFRAA >60 06/01/2011 1457    BNP No results found for: BNP  ProBNP No results found for: PROBNP  Imaging: No results found.   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

## 2019-07-24 NOTE — Assessment & Plan Note (Signed)
Kiara Medina has had some slight, progressive worsening of her relapsed Mycobacterium avium pneumonia.  I suspect that she would be much worse if she were not on her current 4 drug antibiotic regimen but the only way to know for sure would be to have her stop it.  I talked her about options today including stopping all antibiotics, continuing her oral antibiotics and stopping aerosolized amikacin, or continuing on with all 4 current antibiotics.  She elects to continue her current regimen and reconsider in about 4 months.

## 2019-07-24 NOTE — Patient Instructions (Addendum)
Continue hypertonic saline nebulizer 1-2 times a day  If you continue to have moderate congestion, consider therapy vest   Follow up with ID as recommended   Refer to Dr. Carlis Abbott for 6 months follow-up (previous [facility] patient)    Bronchiectasis  Bronchiectasis is a condition in which the airways in the lungs (bronchi) are damaged and widened. The condition makes it hard for the lungs to get rid of mucus, and it causes mucus to gather in the bronchi. This condition often leads to lung infections, which can make the condition worse. What are the causes? You can be born with this condition or you can develop it later in life. Common causes of this condition include:  Cystic fibrosis.  Repeated lung infections, such as pneumonia or tuberculosis.  An object or other blockage in the lungs.  Breathing in fluid, food, or other objects (aspiration).  A problem with the immune system and lung structure that is present at birth (congenital). Sometimes the cause is not known. What are the signs or symptoms? Common symptoms of this condition include:  A daily cough that brings up mucus and lasts for more than 3 weeks.  Lung infections that happen often.  Shortness of breath and wheezing.  Weakness and fatigue. How is this diagnosed? This condition is diagnosed with tests, such as:  Chest X-rays or CT scans. These are done to check for changes in the lungs.  Breathing tests. These are done to check how well your lungs are working.  A test of a sample of your saliva (sputum culture). This test is done to check for infection.  Blood tests and other tests. These are done to check for related diseases or causes. How is this treated? Treatment for this condition depends on the severity of the illness and its cause. Treatment may include:  Medicines that loosen mucus so it can be coughed up (expectorants).  Medicines that relax the muscles of the bronchi  (bronchodilators).  Antibiotic medicines to prevent or treat infection.  Physical therapy to help clear mucus from the lungs. Techniques may include: ? Postural drainage. This is when you sit or lie in certain positions so that mucus can drain by gravity. ? Chest percussion. This involves tapping the chest or back with a cupped hand. ? Chest vibration. For this therapy, a hand or special equipment vibrates your chest and back.  Surgery to remove the affected part of the lung. This may be done in severe cases. Follow these instructions at home: Medicines  Take over-the-counter and prescription medicines only as told by your health care provider.  If you were prescribed an antibiotic medicine, take it as told by your health care provider. Do not stop taking the antibiotic even if you start to feel better.  Avoid taking sedatives and antihistamines unless your health care provider tells you to take them. These medicines tend to thicken the mucus in the lungs. Managing symptoms  Perform breathing exercises or techniques to clear your lungs as told by your health care provider.  Consider using a cold steam vaporizer or humidifier in your room or home to help loosen secretions.  If you have a cough that gets worse at night, try sleeping in a semi-upright position. General instructions  Get plenty of rest.  Drink enough fluid to keep your urine clear or pale yellow.  Stay inside when pollution and ozone levels are high.  Stay up to date with vaccinations and immunizations.  Avoid cigarette smoke and other lung  irritants.  Do not use any products that contain nicotine or tobacco, such as cigarettes and e-cigarettes. If you need help quitting, ask your health care provider.  Keep all follow-up visits as told by your health care provider. This is important. Contact a health care provider if:  You cough up more sputum than before and the sputum is yellow or green in color.  You have  a fever.  You cannot control your cough and are losing sleep. Get help right away if:  You cough up blood.  You have chest pain.  You have increasing shortness of breath.  You have pain that gets worse or is not controlled with medicines.  You have a fever and your symptoms suddenly get worse. Summary  Bronchiectasis is a condition in which the airways in the lungs (bronchi) are damaged and widened. The condition makes it hard for the lungs to get rid of mucus, and it causes mucus to gather in the bronchi.  Treatment usually includes therapy to help clear mucus from the lungs.  Stay up to date with vaccinations and immunizations. This information is not intended to replace advice given to you by your health care provider. Make sure you discuss any questions you have with your health care provider. Document Released: 09/27/2007 Document Revised: 11/12/2017 Document Reviewed: 01/04/2017 Elsevier Patient Education  2020 [employer].

## 2019-07-29 NOTE — Progress Notes (Signed)
Reviewed, agree 

## 2019-08-09 DIAGNOSIS — L820 Inflamed seborrheic keratosis: Secondary | ICD-10-CM | POA: Diagnosis not present

## 2019-08-09 DIAGNOSIS — D225 Melanocytic nevi of trunk: Secondary | ICD-10-CM | POA: Diagnosis not present

## 2019-08-09 DIAGNOSIS — Z85828 Personal history of other malignant neoplasm of skin: Secondary | ICD-10-CM | POA: Diagnosis not present

## 2019-08-09 DIAGNOSIS — L814 Other melanin hyperpigmentation: Secondary | ICD-10-CM | POA: Diagnosis not present

## 2019-08-09 DIAGNOSIS — L821 Other seborrheic keratosis: Secondary | ICD-10-CM | POA: Diagnosis not present

## 2019-08-11 ENCOUNTER — Other Ambulatory Visit: Payer: Self-pay | Admitting: Internal Medicine

## 2019-08-11 DIAGNOSIS — A310 Pulmonary mycobacterial infection: Secondary | ICD-10-CM

## 2019-08-11 MED ORDER — ETHAMBUTOL HCL 400 MG PO TABS: 1200.0000 mg | ORAL_TABLET | Freq: Every day | ORAL | 1 refills | Status: DC

## 2019-08-14 ENCOUNTER — Telehealth: Payer: Self-pay | Admitting: Pharmacist

## 2019-08-14 NOTE — Telephone Encounter (Signed)
Sent in refill application for clofazimine. Medication should arrive to clinic in 7-10 business days. Will update encounter when medication arrives.  

## 2019-08-16 NOTE — Telephone Encounter (Signed)
Patient returned call. She asked that I put her medication up front and she will stop by in the next week when she is in town to pick it up.

## 2019-08-16 NOTE — Telephone Encounter (Addendum)
2 bottles of clofazimine are ready in pharmacy office for patient to pick up when needed.  Called patient to let her know. Left HIPAA compliant VM.

## 2019-08-20 DIAGNOSIS — J479 Bronchiectasis, uncomplicated: Secondary | ICD-10-CM | POA: Diagnosis not present

## 2019-08-20 DIAGNOSIS — A310 Pulmonary mycobacterial infection: Secondary | ICD-10-CM | POA: Diagnosis not present

## 2019-09-13 DIAGNOSIS — Z23 Encounter for immunization: Secondary | ICD-10-CM | POA: Diagnosis not present

## 2019-09-17 ENCOUNTER — Other Ambulatory Visit: Payer: Self-pay | Admitting: Pulmonary Disease

## 2019-09-19 DIAGNOSIS — J479 Bronchiectasis, uncomplicated: Secondary | ICD-10-CM | POA: Diagnosis not present

## 2019-09-19 DIAGNOSIS — A310 Pulmonary mycobacterial infection: Secondary | ICD-10-CM | POA: Diagnosis not present

## 2019-09-29 DIAGNOSIS — H43811 Vitreous degeneration, right eye: Secondary | ICD-10-CM | POA: Diagnosis not present

## 2019-09-29 DIAGNOSIS — Z961 Presence of intraocular lens: Secondary | ICD-10-CM | POA: Diagnosis not present

## 2019-09-29 DIAGNOSIS — H4423 Degenerative myopia, bilateral: Secondary | ICD-10-CM | POA: Diagnosis not present

## 2019-10-17 ENCOUNTER — Other Ambulatory Visit: Payer: Self-pay | Admitting: Internal Medicine

## 2019-10-17 DIAGNOSIS — A310 Pulmonary mycobacterial infection: Secondary | ICD-10-CM

## 2019-10-24 DIAGNOSIS — Z961 Presence of intraocular lens: Secondary | ICD-10-CM | POA: Diagnosis not present

## 2019-10-24 DIAGNOSIS — H43811 Vitreous degeneration, right eye: Secondary | ICD-10-CM | POA: Diagnosis not present

## 2019-10-24 DIAGNOSIS — H4423 Degenerative myopia, bilateral: Secondary | ICD-10-CM | POA: Diagnosis not present

## 2019-11-07 ENCOUNTER — Encounter: Payer: Self-pay | Admitting: Pharmacist

## 2019-11-13 ENCOUNTER — Telehealth: Payer: Self-pay | Admitting: *Deleted

## 2019-11-13 NOTE — Telephone Encounter (Signed)
ERROR

## 2019-11-22 ENCOUNTER — Telehealth: Payer: Self-pay

## 2019-11-22 NOTE — Telephone Encounter (Signed)
COVID-19 Pre-Screening Questions:11/22/19  Do you currently have a fever (>100 F), chills or unexplained body aches? NO  Are you currently experiencing new cough, shortness of breath, sore throat, runny nose?NO .  Have you recently travelled outside the state of [place] in the last 14 days?NO .  Have you been in contact with someone that is currently pending confirmation of Covid19 testing or has been confirmed to have the Covid19 virus?  NO  **If the patient answers NO to ALL questions -  advise the patient to please call the clinic before coming to the office should any symptoms develop.     

## 2019-11-23 ENCOUNTER — Other Ambulatory Visit: Payer: Self-pay

## 2019-11-23 ENCOUNTER — Ambulatory Visit
Admission: RE | Admit: 2019-11-23 | Discharge: 2019-11-23 | Disposition: A | Discharge status: Home / Self Care | Payer: Medicare HMO | Source: Ambulatory Visit | Attending: Internal Medicine | Admitting: Internal Medicine

## 2019-11-23 ENCOUNTER — Ambulatory Visit (INDEPENDENT_AMBULATORY_CARE_PROVIDER_SITE_OTHER): Payer: Medicare HMO | Admitting: Internal Medicine

## 2019-11-23 DIAGNOSIS — A310 Pulmonary mycobacterial infection: Secondary | ICD-10-CM

## 2019-11-23 DIAGNOSIS — R918 Other nonspecific abnormal finding of lung field: Secondary | ICD-10-CM | POA: Diagnosis not present

## 2019-11-23 DIAGNOSIS — J439 Emphysema, unspecified: Secondary | ICD-10-CM | POA: Diagnosis not present

## 2019-11-23 NOTE — Assessment & Plan Note (Signed)
She has smoldering, relapsed Mycobacterium avium pneumonia.  Her isolate is multidrug-resistant and incurable.  The best we can hope for his delay in progression with her current 4 drug antibiotic regimen.  I suggested that she use her albuterol inhaler before any significant exertion.  She seems to be tolerating her antibiotics reasonably well and wants to continue them.  I will check blood work and a repeat chest x-ray today and see her back in 3 months.  I have recommended that she receive the Covid vaccine once it is available to her.

## 2019-11-23 NOTE — Progress Notes (Signed)
[place] [facility]  Patient Active Problem List   Diagnosis Date Noted  . 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 BRONCHIECTASIS 05/23/2009    Priority: Medium  . Unintentional weight loss 05/17/2019  . Dysphonia 01/11/2018  . Diarrhea 01/11/2018  . Right shoulder pain 01/21/2017  . Encounter for hepatitis C screening test for low risk patient 01/21/2017  . GERD (gastroesophageal reflux disease) 02/21/2016  . 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 Dyslipidemia 11/14/2014  . Allergic rhinitis 05/03/2009    Patient's Medications  New Prescriptions   No medications on file  Previous Medications   ACYCLOVIR (ZOVIRAX) 400 MG TABLET    Take 1 tablet (400 mg total) by mouth 5 (five) times daily.   ALBUTEROL (PROVENTIL HFA;VENTOLIN HFA) 108 (90 BASE) MCG/ACT INHALER    Inhale 2 puffs into the lungs every 6 (six) hours as needed for wheezing or shortness of breath.   ARIKAYCE 590 MG/8.4ML SUSP    Inhale the contents of one vial (590mg ) via Lamira device 1 time daily as directed.   ATORVASTATIN (LIPITOR) 20 MG TABLET    Take 20 mg by mouth daily.   B COMPLEX VITAMINS (B COMPLEX-B12 PO)    Take 1 tablet by mouth daily.   BENZONATATE (TESSALON) 100 MG CAPSULE    benzonatate 100 mg capsule   BIOTIN 5000 PO    Take 2 capsules by mouth daily.   CALCIUM CARBONATE-VITAMIN D (CALCIUM-VITAMIN D3) 600-200 MG-UNIT TABS    Take 1 tablet by mouth 2 (two) times daily.     CETIRIZINE (ZYRTEC) 10 MG TABLET    every morning.   CHOLECALCIFEROL (VITAMIN D3) 1000 UNITS CAPS    Take 2,000 Units by mouth daily.   CLOFAZIMINE PO    Take 2 tablets by mouth daily.   ETHAMBUTOL (MYAMBUTOL) 400 MG TABLET    Take 3 tablets (1,200 mg total) by mouth daily.   FLAXSEED, LINSEED, (FLAX SEED OIL) 1300 MG CAPS    Take by mouth daily.   GLUCOSAMINE-CHONDROITIN 1500-1200 MG/30ML LIQD    Take 1 tablet by mouth daily.   HYDROCORTISONE (PROCTOZONE-HC) 2.5 %  RECTAL CREAM    Proctozone-HC 2.5 % topical cream perineal applicator   HYDROCORTISONE 2.5 % CREAM    hydrocortisone 2.5 % topical cream  APPLY A THIN LAYER TO THE  AFFECTED AREA THREE TIMES DAILY AS NEEDED   MELATONIN 10 MG CAPS    10 mg nightly as needed.   OMEPRAZOLE (PRILOSEC) 40 MG CAPSULE    TAKE 1 CAPSULE TWICE DAILY   OXYCODONE (OXY IR/ROXICODONE) 5 MG IMMEDIATE RELEASE TABLET    oxycodone 5 mg tablet   PROBIOTIC PRODUCT (PROBIOTIC DAILY PO)    Take 1 capsule by mouth daily.   RESPIRATORY THERAPY SUPPLIES (FLUTTER) DEVI    Use as directed.   RIFAMPIN (RIFADIN) 300 MG CAPSULE    TAKE 2 CAPSULES EVERY DAY   SODIUM CHLORIDE HYPERTONIC 3 % NEBULIZER SOLUTION    76mL via nebulizer BID.  DX: J47.9   SPACER/AERO-HOLDING CHAMBERS (AEROCHAMBER MV) INHALER    Use as instructed   TURMERIC PO    Take 2,000 mg by mouth daily.    VITAMIN B-12 (CYANOCOBALAMIN) 1000 MCG TABLET    Take 2,000 mcg by mouth daily.  Modified Medications   No medications on file  Discontinued Medications   No medications on file    Subjective: Kiara Medina is in for her routine follow-up  visit.  She is now completed 23 months of therapy for relapsed, antibiotic resistant Mycobacterium avium pneumonia.  She has been receiving oral ethambutol, rifampin and clofazimine along with aerosolized amikacin.  She believes that her chronic cough is a little bit worse recently.  She also thinks she may be more short of breath on exertion.  She is still bringing up about the same amount of sputum.  3 days ago she had a small amount of hemoptysis.  She has not had any fever, chills or sweats.  Her appetite and weight are unchanged.  About 6 months ago she developed a floater in her right eye.  She was evaluated by a retinal specialist and is due to follow-up with him next month.  No specific abnormalities were found.  She has had no other changes in her vision. She has had no change in her chronic tinnitus or hearing loss.  Review of  Systems: Review of Systems  Constitutional: Positive for malaise/fatigue. Negative for chills, diaphoresis, fever and weight loss.  HENT: Positive for hearing loss and tinnitus.        She has had no change in her hearing loss or tinnitus since starting on amikacin and her audiograms have not revealed any acute changes.  Respiratory: Positive for cough, hemoptysis, sputum production, shortness of breath and wheezing.   Cardiovascular: Negative for chest pain.  Gastrointestinal: Negative for abdominal pain, diarrhea, nausea and vomiting.  Musculoskeletal: Positive for joint pain.    Past Medical History:  Diagnosis Date  . Allergic rhinitis, cause unspecified   . Bronchiectasis without acute exacerbation ([place])   . Cough   . Sinusitis     Social History   Tobacco Use  . Smoking status: Former Smoker    Packs/day: 1.00    Years: 20.00    Pack years: 20.00    Types: Cigarettes    Quit date: 12/14/1985    Years since quitting: 33.9  . Smokeless tobacco: Never Used  Substance Use Topics  . Alcohol use: Yes    Alcohol/week: 1.0 standard drinks    Types: 1 Glasses of wine per week    Comment: occ  . Drug use: No    Family History  Problem Relation Age of Onset  . Emphysema Sister   . Stroke Mother   . Clotting disorder Mother     No Known Allergies  Objective: Vitals:   11/23/19 1545  BP: 109/71  Pulse: 90  Weight: 132 lb (59.9 kg)   Body mass index is 23.02 kg/m.  Physical Exam Constitutional:      Comments: She is in good spirits as usual.  Her weight is unchanged from her last visit.  Cardiovascular:     Rate and Rhythm: Normal rate and regular rhythm.     Heart sounds: No murmur.  Pulmonary:     Effort: Pulmonary effort is normal.     Breath sounds: Wheezing present. No rhonchi or rales.  Psychiatric:        Mood and Affect: Mood normal.     Lab Results CMP     Component Value Date/Time   NA 141 05/17/2019 1144   K 4.0 05/17/2019 1144   CL 103  05/17/2019 1144   CO2 29 05/17/2019 1144   GLUCOSE 87 05/17/2019 1144   BUN 16 05/17/2019 1144   CREATININE 0.81 05/17/2019 1144   CALCIUM 9.5 05/17/2019 1144   PROT 6.4 05/17/2019 1144   ALBUMIN 3.8 04/23/2015 1057   AST 22 05/17/2019 1144  ALT 15 05/17/2019 1144   ALKPHOS 55 04/23/2015 1057   BILITOT 0.7 05/17/2019 1144   GFRNONAA >60 06/01/2011 1457   GFRAA >60 06/01/2011 1457   Lab Results  Component Value Date   WBC 6.8 05/17/2019   HGB 15.1 05/17/2019   HCT 43.7 05/17/2019   MCV 93.4 05/17/2019   PLT 157 05/17/2019   Sputum AFB culture 05/17/2019; + for Mycobacterium avium again  Chest x-ray 05/17/2019 IMPRESSION: Stable chronic lung disease.  No acute cardiopulmonary abnormality.    By: Genevie Ann M.D.   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  Her isolate is multidrug-resistant and incurable.  The best we can hope for his delay in progression with her current 4 drug antibiotic regimen.  I suggested that she use her albuterol inhaler before any significant exertion.  She seems to be tolerating her antibiotics reasonably well and wants to continue them.  I will check blood work and a repeat chest x-ray today and see her back in 3 months.  I have recommended that she receive the Covid vaccine once it is available to her.      Relevant Orders   CBC   CMP   DG Chest 2 View   MYCOBACTERIA, CULTURE, WITH FLUOROCHROME SMEAR       Michel Bickers, MD [facility] for [place] [phone] pager   [phone] cell 11/23/2019, 4:13 PM

## 2019-11-24 LAB — COMPREHENSIVE METABOLIC PANEL
AG Ratio: 1.5 (calc) (ref 1.0–2.5)
ALT: 18 U/L (ref 6–29)
AST: 22 U/L (ref 10–35)
Albumin: 3.7 g/dL (ref 3.6–5.1)
Alkaline phosphatase (APISO): 69 U/L (ref 37–153)
BUN/Creatinine Ratio: 15 (calc) (ref 6–22)
BUN: 14 mg/dL (ref 7–25)
CO2: 28 mmol/L (ref 20–32)
Calcium: 9.3 mg/dL (ref 8.6–10.4)
Chloride: 104 mmol/L (ref 98–110)
Creat: 0.94 mg/dL — ABNORMAL HIGH (ref 0.60–0.93)
Globulin: 2.4 g/dL (calc) (ref 1.9–3.7)
Glucose, Bld: 104 mg/dL — ABNORMAL HIGH (ref 65–99)
Potassium: 4.4 mmol/L (ref 3.5–5.3)
Sodium: 140 mmol/L (ref 135–146)
Total Bilirubin: 0.3 mg/dL (ref 0.2–1.2)
Total Protein: 6.1 g/dL (ref 6.1–8.1)

## 2019-11-24 LAB — CBC
HCT: 43 % (ref 35.0–45.0)
Hemoglobin: 14.2 g/dL (ref 11.7–15.5)
MCH: 31.8 pg (ref 27.0–33.0)
MCHC: 33 g/dL (ref 32.0–36.0)
MCV: 96.2 fL (ref 80.0–100.0)
MPV: 11.1 fL (ref 7.5–12.5)
Platelets: 186 10*3/uL (ref 140–400)
RBC: 4.47 10*6/uL (ref 3.80–5.10)
RDW: 12.7 % (ref 11.0–15.0)
WBC: 6.9 10*3/uL (ref 3.8–10.8)

## 2019-11-27 ENCOUNTER — Telehealth: Payer: Self-pay

## 2019-11-27 NOTE — Telephone Encounter (Signed)
I called and spoke with Kiara Medina today after reviewing her chest x-rays personally.  I told her that I do not see any significant changes in her heart size or chronic lung findings.  She says that she has noted some improvement in her shortness of breath and wheezing with her new albuterol inhaler.  She will continue her current 4 drug antibiotic regimen for now.

## 2019-11-27 NOTE — Telephone Encounter (Signed)
Patient called office today with questions regarding imaging done on 12/11.Patient states that in the findings she noticed documentation regarding enlarge heart. Patient is not sure what this means and would like to discuss with MD about findings. Patient stats that imaging done on 05/17/19 and 10/10/2018 did not note this finding. Will forward message to MD to review [place], CMA\

## 2019-12-02 ENCOUNTER — Other Ambulatory Visit: Payer: Self-pay | Admitting: Pulmonary Disease

## 2019-12-05 ENCOUNTER — Telehealth: Payer: Self-pay | Admitting: Pharmacy Technician

## 2019-12-05 ENCOUNTER — Telehealth: Payer: Self-pay | Admitting: Critical Care Medicine

## 2019-12-05 MED ORDER — OMEPRAZOLE 40 MG PO CPDR: 40.0000 mg | DELAYED_RELEASE_CAPSULE | Freq: Two times a day (BID) | ORAL | 1 refills | Status: DC

## 2019-12-05 NOTE — Telephone Encounter (Signed)
Rx for omeprazole sent to pt's [facility] mail order pharmacy. Nothing further needed.

## 2019-12-05 NOTE — Telephone Encounter (Signed)
[facility] Patient Advocate Encounter  Prior Authorization for Aryikayce has been approved.    PA# [id number] Effective dates: 12/14/2019 through 12/13/2020.    Venida Jarvis. Nadara Mustard [place] Patient [facility] for Infectious Disease Phone: [phone] Fax:  [phone]

## 2019-12-11 ENCOUNTER — Telehealth: Payer: Self-pay

## 2019-12-11 NOTE — Telephone Encounter (Signed)
Contains abnormal data MYCOBACTERIA, CULTURE, WITH FLUOROCHROME SMEAR Order: [id number] Status:  Preliminary result Visible to patient:  No (not released) Next appt:  12/28/2019 at 02:30 PM in Pulmonology Julian Hy, DO) Dx:  Mycobacterium avium-intracellulare co... Specimen Information: Sputum     Component 2 wk ago  MICRO NUMBER: [id number] P   SPECIMEN QUALITY: Adequate P   Source: SPUTUM P   STATUS: PRELIMINARY P   SMEAR: acid-fast bacillus Abnormal  P   Comment: Few (2 +) acid-fast bacilli seen using the fluorochrome method.  ISOLATE 1: Mycobacterium, avium-intracellulare group Abnormal  P   Comment: DNA probe result positive for Mycobacterium avium c        Eugenia Mcalpine

## 2019-12-18 DIAGNOSIS — Z9181 History of falling: Secondary | ICD-10-CM | POA: Diagnosis not present

## 2019-12-18 DIAGNOSIS — Z1331 Encounter for screening for depression: Secondary | ICD-10-CM | POA: Diagnosis not present

## 2019-12-18 DIAGNOSIS — R7301 Impaired fasting glucose: Secondary | ICD-10-CM | POA: Diagnosis not present

## 2019-12-18 DIAGNOSIS — H6121 Impacted cerumen, right ear: Secondary | ICD-10-CM | POA: Diagnosis not present

## 2019-12-18 DIAGNOSIS — E785 Hyperlipidemia, unspecified: Secondary | ICD-10-CM | POA: Diagnosis not present

## 2019-12-18 DIAGNOSIS — E559 Vitamin D deficiency, unspecified: Secondary | ICD-10-CM | POA: Diagnosis not present

## 2019-12-18 DIAGNOSIS — A310 Pulmonary mycobacterial infection: Secondary | ICD-10-CM | POA: Diagnosis not present

## 2019-12-18 DIAGNOSIS — K219 Gastro-esophageal reflux disease without esophagitis: Secondary | ICD-10-CM | POA: Diagnosis not present

## 2019-12-18 DIAGNOSIS — Z136 Encounter for screening for cardiovascular disorders: Secondary | ICD-10-CM | POA: Diagnosis not present

## 2019-12-18 DIAGNOSIS — Z0000 Encounter for general adult medical examination without abnormal findings: Secondary | ICD-10-CM | POA: Diagnosis not present

## 2019-12-18 DIAGNOSIS — J479 Bronchiectasis, uncomplicated: Secondary | ICD-10-CM | POA: Diagnosis not present

## 2019-12-20 DIAGNOSIS — Z85828 Personal history of other malignant neoplasm of skin: Secondary | ICD-10-CM | POA: Diagnosis not present

## 2019-12-20 DIAGNOSIS — L814 Other melanin hyperpigmentation: Secondary | ICD-10-CM | POA: Diagnosis not present

## 2019-12-20 DIAGNOSIS — D2261 Melanocytic nevi of right upper limb, including shoulder: Secondary | ICD-10-CM | POA: Diagnosis not present

## 2019-12-20 DIAGNOSIS — D485 Neoplasm of uncertain behavior of skin: Secondary | ICD-10-CM | POA: Diagnosis not present

## 2019-12-20 DIAGNOSIS — L821 Other seborrheic keratosis: Secondary | ICD-10-CM | POA: Diagnosis not present

## 2019-12-20 DIAGNOSIS — D2262 Melanocytic nevi of left upper limb, including shoulder: Secondary | ICD-10-CM | POA: Diagnosis not present

## 2019-12-20 DIAGNOSIS — D225 Melanocytic nevi of trunk: Secondary | ICD-10-CM | POA: Diagnosis not present

## 2019-12-20 DIAGNOSIS — L820 Inflamed seborrheic keratosis: Secondary | ICD-10-CM | POA: Diagnosis not present

## 2019-12-28 ENCOUNTER — Ambulatory Visit: Payer: Medicare HMO | Admitting: Critical Care Medicine

## 2019-12-28 ENCOUNTER — Encounter: Payer: Self-pay | Admitting: Critical Care Medicine

## 2019-12-28 ENCOUNTER — Other Ambulatory Visit: Payer: Self-pay

## 2019-12-28 VITALS — BP 122/68 | HR 100 | Temp 98.4°F | Ht 63.5 in | Wt 129.8 lb

## 2019-12-28 DIAGNOSIS — J479 Bronchiectasis, uncomplicated: Secondary | ICD-10-CM

## 2019-12-28 DIAGNOSIS — A310 Pulmonary mycobacterial infection: Secondary | ICD-10-CM | POA: Diagnosis not present

## 2019-12-28 NOTE — Patient Instructions (Addendum)
Thank you for visiting Dr. Carlis Abbott at [facility]. We recommend the following: Orders Placed This Encounter  Procedures  . IgG, IgA, IgM  . IgE  . IgG 1, 2, 3, and 4   Orders Placed This Encounter  Procedures  . IgG, IgA, IgM    Standing Status:   Future    Standing Expiration Date:   12/27/2020  . IgE    Standing Status:   Future    Standing Expiration Date:   12/27/2020  . IgG 1, 2, 3, and 4    Standing Status:   Future    Standing Expiration Date:   12/27/2020      Return in about 3 months (around 03/27/2020).    Please do your part to reduce the spread of COVID-19.

## 2019-12-28 NOTE — Progress Notes (Signed)
Synopsis: Referred in 2010 for bronchiectasis by Nicoletta Dress, MD.  Previously patient of Dr. Normajean Baxter and Dr. Lake Bells.  Subjective:   PATIENT ID: Kiara Medina GENDER: female DOB: Jan 30, 1946, MRN: [id number]  Chief Complaint  Patient presents with  . Follow-up    Kiara Medina is a 74 y/o woman being seen in follow up of chronic bronchiectasis and MAC. She was diagnosed with MAC in 2015, and was originally treated with 3-drug RAE therapy 3 days per week for about a year with negative cultures. She remained off therapy until~2 years ago when she had a recurrence. She is now on 4-drug daily therapy (ethambutol, rifampin, clofazamine, inhaled liposomal amikacin) with a MDR strain of MAC. She has persistently positive cultures. She follows with Dr. Megan Salon in [place]. She has had worse SOB for about 6 months, but attributes this to being at home more and going out less. She has more trouble walking around without getting SOB. She has more trouble breathing at the temperature extremes. She denies wheezing. She has significant sputum production, ranging from white to yellow, sometimes with steaks of blood. Her appetite is poor and she has continued to slowly lose weight over time. She has not been drinking Ensure due to disliking the taste/ texture, but she has been trying to get protein in her diet. She no longer has night sweats, denies fevers, and she has no issues sleeping. She uses her hypertonic saline nebs once daily before using her inhaled amikacin. She uses her flutter valve PRN, mostly when she feels that she is having trouble clearing her mucus.  She is scheduled to receive her covid vaccine next week [facility]). Former smoker-quit 1987, 25-pack-year history        Past Medical History:  Diagnosis Date  . Allergic rhinitis, cause unspecified   . Bronchiectasis without acute exacerbation ([place])   . Cough   . Sinusitis      Family History  Problem Relation Age of Onset  .  Emphysema Sister   . Stroke Mother   . Clotting disorder Mother      Past Surgical History:  Procedure Laterality Date  . APPENDECTOMY    . CATARACT EXTRACTION  08/2013  . CHOLECYSTECTOMY    . TUBAL LIGATION    . VESICOVAGINAL FISTULA CLOSURE W/ TAH      Social History   Socioeconomic History  . Marital status: Married    Spouse name: Not on file  . Number of children: Not on file  . Years of education: Not on file  . Highest education level: Not on file  Occupational History  . Occupation: realtor  Tobacco Use  . Smoking status: Former Smoker    Packs/day: 1.00    Years: 20.00    Pack years: 20.00    Types: Cigarettes    Quit date: 12/14/1985    Years since quitting: 34.0  . Smokeless tobacco: Never Used  Substance and Sexual Activity  . Alcohol use: Yes    Alcohol/week: 1.0 standard drinks    Types: 1 Glasses of wine per week    Comment: occ  . Drug use: No  . Sexual activity: Not on file  Other Topics Concern  . Not on file  Social History Narrative  . Not on file   Social Determinants of Health   Financial Resource Strain:   . Difficulty of Paying Living Expenses: Not on file  Food Insecurity:   . Worried About Charity fundraiser in the Last  Year: Not on file  . Ran Out of Food in the Last Year: Not on file  Transportation Needs:   . Lack of Transportation (Medical): Not on file  . Lack of Transportation (Non-Medical): Not on file  Physical Activity:   . Days of Exercise per Week: Not on file  . Minutes of Exercise per Session: Not on file  Stress:   . Feeling of Stress : Not on file  Social Connections:   . Frequency of Communication with Friends and Family: Not on file  . Frequency of Social Gatherings with Friends and Family: Not on file  . Attends Religious Services: Not on file  . Active Member of Clubs or Organizations: Not on file  . Attends Archivist Meetings: Not on file  . Marital Status: Not on file  Intimate Partner Violence:    . Fear of Current or Ex-Partner: Not on file  . Emotionally Abused: Not on file  . Physically Abused: Not on file  . Sexually Abused: Not on file     No Known Allergies   Immunization History  Administered Date(s) Administered  . Influenza Split 09/13/2012, 09/06/2014  . Influenza Whole 09/09/2009, 08/14/2010, 09/14/2011  . Influenza, High Dose Seasonal PF 10/10/2015  . Influenza,inj,Quad PF,6+ Mos 09/13/2013, 09/21/2016, 08/26/2017, 08/22/2018  . Influenza-Unspecified 08/29/2019  . Pneumococcal Conjugate-13 11/24/2014  . Pneumococcal Polysaccharide-23 07/17/2009    Outpatient Medications Prior to Visit  Medication Sig Dispense Refill  . acyclovir (ZOVIRAX) 400 MG tablet Take 400 mg by mouth 2 (two) times daily.    Marland Kitchen albuterol (PROVENTIL HFA;VENTOLIN HFA) 108 (90 Base) MCG/ACT inhaler Inhale 2 puffs into the lungs every 6 (six) hours as needed for wheezing or shortness of breath. 1 Inhaler 0  . ARIKAYCE 590 MG/8.4ML SUSP Inhale the contents of one vial (545m) via Lamira device 1 time daily as directed. 8.4 mL 2  . atorvastatin (LIPITOR) 20 MG tablet Take 20 mg by mouth daily.    . B Complex Vitamins (B COMPLEX-B12 PO) Take 1 tablet by mouth daily.    . benzonatate (TESSALON) 100 MG capsule benzonatate 100 mg capsule    . BIOTIN 5000 PO Take 2 capsules by mouth daily.    . Calcium Carbonate-Vitamin D (CALCIUM-VITAMIN D3) 600-200 MG-UNIT TABS Take 1 tablet by mouth 2 (two) times daily.      . cetirizine (ZYRTEC) 10 MG tablet every morning.    . Cholecalciferol (VITAMIN D3) 1000 UNITS CAPS Take 2,000 Units by mouth daily.    . CLOFAZIMINE PO Take 2 tablets by mouth daily.    .Marland Kitchenethambutol (MYAMBUTOL) 400 MG tablet Take 3 tablets (1,200 mg total) by mouth daily. 270 tablet 1  . Flaxseed, Linseed, (FLAX SEED OIL) 1300 MG CAPS Take by mouth daily.    . Glucosamine-Chondroitin 1500-1200 MG/30ML LIQD Take 1 tablet by mouth daily.    . hydrocortisone (PROCTOZONE-HC) 2.5 % rectal cream  Proctozone-HC 2.5 % topical cream perineal applicator    . hydrocortisone 2.5 % cream hydrocortisone 2.5 % topical cream  APPLY A THIN LAYER TO THE  AFFECTED AREA THREE TIMES DAILY AS NEEDED    . Melatonin 10 MG CAPS 10 mg nightly as needed.    .Marland Kitchenomeprazole (PRILOSEC) 40 MG capsule Take 1 capsule (40 mg total) by mouth 2 (two) times daily. 180 capsule 1  . Probiotic Product (PROBIOTIC DAILY PO) Take 1 capsule by mouth daily.    .Marland KitchenRespiratory Therapy Supplies (FLUTTER) DEVI Use as directed. 1 each 0  .  rifampin (RIFADIN) 300 MG capsule TAKE 2 CAPSULES EVERY DAY 180 capsule 1  . sodium chloride HYPERTONIC 3 % nebulizer solution 74m via nebulizer BID.  DX: J47.9 720 mL 3  . Spacer/Aero-Holding Chambers (AEROCHAMBER MV) inhaler Use as instructed 1 each 2  . TURMERIC PO Take 2,000 mg by mouth daily.     . vitamin B-12 (CYANOCOBALAMIN) 1000 MCG tablet Take 2,000 mcg by mouth daily.    .Marland Kitchenacyclovir (ZOVIRAX) 400 MG tablet Take 1 tablet (400 mg total) by mouth 5 (five) times daily. (Patient taking differently: Take 400 mg by mouth 2 (two) times daily. )    . oxyCODONE (OXY IR/ROXICODONE) 5 MG immediate release tablet oxycodone 5 mg tablet     No facility-administered medications prior to visit.    Review of Systems  Constitutional: Positive for weight loss. Negative for fever.  Respiratory: Positive for cough, hemoptysis, sputum production and shortness of breath. Negative for wheezing.   Cardiovascular: Negative for chest pain.  Gastrointestinal: Negative for nausea and vomiting.     Objective:   Vitals:   12/28/19 1430  BP: 122/68  Pulse: 100  Temp: 98.4 F (36.9 C)  TempSrc: Oral  SpO2: 94%  Weight: 129 lb 12.8 oz (58.9 kg)  Height: 5' 3.5" (1.613 m)   94% on   RA BMI Readings from Last 3 Encounters:  12/28/19 22.63 kg/m  11/23/19 23.02 kg/m  07/24/19 22.49 kg/m   Wt Readings from Last 3 Encounters:  12/28/19 129 lb 12.8 oz (58.9 kg)  11/23/19 132 lb (59.9 kg)  07/24/19  129 lb (58.5 kg)    Physical Exam Vitals reviewed.  Constitutional:      Appearance: She is not ill-appearing.  HENT:     Head: Normocephalic and atraumatic.     Nose:     Comments: Deferred due to masking requirement.    Mouth/Throat:     Comments: Deferred due to masking requirement. Eyes:     General: No scleral icterus. Cardiovascular:     Rate and Rhythm: Normal rate and regular rhythm.     Heart sounds: No murmur.  Pulmonary:     Comments: Breathing comfortably on RA, not coughing much during encounter. Tubular breath sounds right base, faint rhonchi left upper lung field. No conversational dyspnea. Abdominal:     General: There is no distension.     Palpations: Abdomen is soft.     Tenderness: There is no abdominal tenderness.  Musculoskeletal:        General: No swelling or deformity.     Cervical back: Neck supple.  Lymphadenopathy:     Cervical: No cervical adenopathy.  Skin:    General: Skin is warm and dry.     Findings: No rash.  Neurological:     General: No focal deficit present.     Mental Status: She is alert.     Motor: No weakness.     Coordination: Coordination normal.  Psychiatric:        Mood and Affect: Mood normal.        Behavior: Behavior normal.      CBC    Component Value Date/Time   WBC 6.9 11/23/2019 1608   RBC 4.47 11/23/2019 1608   HGB 14.2 11/23/2019 1608   HCT 43.0 11/23/2019 1608   PLT 186 11/23/2019 1608   MCV 96.2 11/23/2019 1608   MCH 31.8 11/23/2019 1608   MCHC 33.0 11/23/2019 1608   RDW 12.7 11/23/2019 1608   LYMPHSABS 1,929 01/10/2019 1657  EOSABS 289 01/10/2019 1657   BASOSABS 18 01/10/2019 1657    CHEMISTRY No results for input(s): NA, K, CL, CO2, GLUCOSE, BUN, CREATININE, CALCIUM, MG, PHOS in the last 168 hours. CrCl cannot be calculated (Patient's most recent lab result is older than the maximum 21 days allowed.).   Cultures: 11/23/2019-MAI 05/17/2019-MAI 03/15/2019-MAI 01/10/2019  MAI 11/07/2018-MAI 10/10/2018-MAI  08/22/2018-MAI 07/11/2018-MAI 05/31/2018-MAI MAI susceptibilities-resistant clarithromycin, linezolid.  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  Susceptible to amikacin, cefepime, clarithromycin, imipenem, linezolid, trimethoprim sulfamethoxazole. 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  Compared to 2015 CXR, more disease in the lower lobes.  CT chest high-resolution 01/29/2014-nodules throughout, a few starting to cavitate.  Most disease in bilateral upper lobes, RML, lingula.  Lower lobes relatively preserved.  Pulmonary Functions Testing Results: PFT Results Latest Ref Rng & Units 04/18/2018  FVC-Pre L 2.08  FVC-Predicted Pre % 79  Pre FEV1/FVC % % 75  FEV1-Pre L 1.57  FEV1-Predicted Pre % 80   No significant obstruction, mildly reduced FVC.     Assessment & Plan:     ICD-10-CM   1. Bronchiectasis without complication (HCC)  Z61.0 IgG, IgA, IgM    IgE    IgG 1, 2, 3, and 4    IgG 1, 2, 3, and 4    IgE    IgG, IgA, IgM    CANCELED: IgG 1, 2, 3, and 4  2. 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 No apparent limiting side effects -con't airway clearance therapy- she finds using her hypertonic saline nebs BID to be too cumbersome. Given her  symptom stability, this is ok for now, but if there is any decline she needs to increase to 2-3 times daily of hypertonic saline and flutter valve. -con't efforts to stop weight loss and maintain her weight- recommended frequent snacking, having foods available that she enjoys, and finding ways to add extra calories into her meals with butter, condiments, etc. -if she has an exacerbation, please recollect sputum for routine respiratory culture due to concern for polymicrobial colonization that could lead to infection -avoid ICS and steroids -con't PRN cough suppressive therapy to make symptoms tolerable -con't covid precautions. Glad that she is able to get her vaccine soon.  RTC in 3 months.     Current Outpatient Medications:  .  acyclovir (ZOVIRAX) 400 MG tablet, Take 400 mg by mouth 2 (two) times daily., Disp: , Rfl:  .  albuterol (PROVENTIL HFA;VENTOLIN HFA) 108 (90 Base) MCG/ACT inhaler, Inhale 2 puffs into the lungs every 6 (six) hours as needed for wheezing or shortness of breath., Disp: 1 Inhaler, Rfl: 0 .  ARIKAYCE 590 MG/8.4ML SUSP, Inhale the contents of one vial (556m) via Lamira device 1 time daily as directed., Disp: 8.4 mL, Rfl: 2 .  atorvastatin (LIPITOR) 20 MG tablet, Take 20 mg by mouth daily., Disp: , Rfl:  .  B Complex Vitamins (B COMPLEX-B12 PO), Take 1 tablet by mouth daily., Disp: , Rfl:  .  benzonatate (TESSALON) 100 MG capsule, benzonatate 100 mg capsule, Disp: , Rfl:  .  BIOTIN 5000 PO, Take 2 capsules by mouth daily., Disp: , Rfl:  .  Calcium Carbonate-Vitamin D (CALCIUM-VITAMIN D3) 600-200 MG-UNIT TABS, Take 1 tablet by mouth 2 (two) times daily.  , Disp: , Rfl:  .  cetirizine (ZYRTEC) 10 MG tablet, every morning., Disp: , Rfl:  .  Cholecalciferol (VITAMIN D3) 1000 UNITS CAPS, Take 2,000 Units by mouth daily., Disp: , Rfl:  .  CLOFAZIMINE PO, Take 2 tablets by mouth daily., Disp: , Rfl:  .  ethambutol (MYAMBUTOL) 400 MG tablet, Take 3 tablets (1,200 mg total) by  mouth daily., Disp: 270 tablet, Rfl: 1 .  Flaxseed, Linseed, (FLAX SEED OIL) 1300 MG CAPS, Take by mouth daily., Disp: , Rfl:  .  Glucosamine-Chondroitin 1500-1200 MG/30ML LIQD, Take 1 tablet by mouth daily., Disp: , Rfl:  .  hydrocortisone (PROCTOZONE-HC) 2.5 % rectal cream, Proctozone-HC 2.5 % topical cream perineal applicator, Disp: , Rfl:  .  hydrocortisone 2.5 % cream, hydrocortisone 2.5 % topical cream  APPLY A THIN LAYER TO THE  AFFECTED AREA THREE TIMES DAILY AS NEEDED, Disp: , Rfl:  .  Melatonin 10 MG CAPS, 10 mg nightly as needed., Disp: , Rfl:  .  omeprazole (PRILOSEC) 40 MG capsule, Take 1 capsule (40 mg total) by mouth 2 (two) times daily., Disp: 180 capsule, Rfl: 1 .  Probiotic Product (PROBIOTIC DAILY PO), Take 1 capsule by mouth daily., Disp: , Rfl:  .  Respiratory Therapy Supplies (FLUTTER) DEVI, Use as directed., Disp: 1 each, Rfl: 0 .  rifampin (RIFADIN) 300 MG capsule, TAKE 2 CAPSULES EVERY DAY, Disp: 180 capsule, Rfl: 1 .  sodium chloride HYPERTONIC 3 % nebulizer solution, 96m via nebulizer BID.  DX: J47.9, Disp: 720 mL, Rfl: 3 .  Spacer/Aero-Holding Chambers (AEROCHAMBER MV) inhaler, Use as instructed, Disp: 1 each, Rfl: 2 .  TURMERIC PO, Take 2,000 mg by mouth daily. , Disp: , Rfl:  .  vitamin B-12 (CYANOCOBALAMIN) 1000 MCG tablet, Take 2,000 mcg by mouth daily., Disp: , Rfl:    I spent 45 minutes on this encounter, including face to face time and non-face to face time spent reviewing records, charting, coordinating care, etc.   LJulian Hy DO [facility] 12/28/2019 2:38 PM

## 2019-12-29 ENCOUNTER — Telehealth: Payer: Self-pay | Admitting: Critical Care Medicine

## 2019-12-29 LAB — IGG, IGA, IGM
IgG (Immunoglobin G), Serum: 954 mg/dL (ref 600–1540)
IgM, Serum: 96 mg/dL (ref 50–300)
Immunoglobulin A: 289 mg/dL (ref 70–320)

## 2019-12-29 LAB — IGE: IgE (Immunoglobulin E), Serum: 10 kU/L (ref <=114)

## 2019-12-29 NOTE — Telephone Encounter (Signed)
No referral unless she has abnormal immunoglobunlin levels. Right now the ones that are back are all normal, so no need to refer her to immunology.  LPC

## 2019-12-29 NOTE — Telephone Encounter (Signed)
Called and spoke with pt letting her know the info stated by Dr. Carlis Abbott and she verbalized understanding. Nothing further needed.

## 2019-12-29 NOTE — Telephone Encounter (Signed)
Dr. Carlis Abbott, please advise what doctor you discussed with pt at last visit for possible referral?

## 2019-12-30 LAB — IGG 1, 2, 3, AND 4
IgG (Immunoglobin G), Serum: 911 mg/dL (ref 586–1602)
IgG, Subclass 1: 447 mg/dL (ref 248–810)
IgG, Subclass 2: 344 mg/dL (ref 130–555)
IgG, Subclass 3: 112 mg/dL — ABNORMAL HIGH (ref 15–102)
IgG, Subclass 4: 12 mg/dL (ref 2–96)

## 2020-01-01 ENCOUNTER — Other Ambulatory Visit: Payer: Self-pay | Admitting: Internal Medicine

## 2020-01-01 DIAGNOSIS — A310 Pulmonary mycobacterial infection: Secondary | ICD-10-CM

## 2020-01-02 ENCOUNTER — Telehealth: Payer: Self-pay | Admitting: Critical Care Medicine

## 2020-01-02 NOTE — Telephone Encounter (Signed)
Randa Spike, CMA  01/02/2020 8:08 AM EST    LMTCB x1 for pt.   Julian Hy, DO  01/01/2020 6:45 PM EST    Please let Ms. Khanam know that her antibody levels were all normal, so there is nothing different that we need to do. No need for an immunology referral.  -------------------------------------- Spoke with pt. She is aware of results. Nothing further was needed.

## 2020-01-03 ENCOUNTER — Other Ambulatory Visit: Payer: Self-pay | Admitting: Internal Medicine

## 2020-01-03 ENCOUNTER — Telehealth: Payer: Self-pay

## 2020-01-03 DIAGNOSIS — A310 Pulmonary mycobacterial infection: Secondary | ICD-10-CM

## 2020-01-03 NOTE — Telephone Encounter (Signed)
Received patients sputum sample results. Susceptibility and sensitivity pending order.   Janay Canan Lorita Officer, RN

## 2020-01-03 NOTE — Telephone Encounter (Signed)
Order placed

## 2020-01-10 ENCOUNTER — Telehealth: Payer: Self-pay

## 2020-01-10 LAB — MYCOBACTERIA,CULT W/FLUOROCHROME SMEAR
MICRO NUMBER:: 1186237
SPECIMEN QUALITY:: ADEQUATE

## 2020-01-10 NOTE — Telephone Encounter (Signed)
MYCOBACTERIA, CULTURE, WITH FLUOROCHROME SMEAR    DNA probe result positive for Mycobacterium avium complex   Acid fast bacillin  [facility] to fax results. Not able to view in epic at this time. Eugenia Mcalpine

## 2020-02-12 ENCOUNTER — Other Ambulatory Visit: Payer: Self-pay | Admitting: Internal Medicine

## 2020-02-12 DIAGNOSIS — A310 Pulmonary mycobacterial infection: Secondary | ICD-10-CM

## 2020-02-21 ENCOUNTER — Encounter: Payer: Self-pay | Admitting: Internal Medicine

## 2020-02-21 ENCOUNTER — Ambulatory Visit: Payer: Medicare HMO | Admitting: Internal Medicine

## 2020-02-21 ENCOUNTER — Other Ambulatory Visit: Payer: Self-pay

## 2020-02-21 ENCOUNTER — Telehealth: Payer: Self-pay | Admitting: Pharmacist

## 2020-02-21 DIAGNOSIS — K219 Gastro-esophageal reflux disease without esophagitis: Secondary | ICD-10-CM

## 2020-02-21 DIAGNOSIS — A310 Pulmonary mycobacterial infection: Secondary | ICD-10-CM | POA: Diagnosis not present

## 2020-02-21 DIAGNOSIS — J479 Bronchiectasis, uncomplicated: Secondary | ICD-10-CM

## 2020-02-21 NOTE — Assessment & Plan Note (Signed)
She has become progressively more symptomatic with worsening dyspnea on exertion and worsening chronic cough.  Certainly suspect that Mycobacterium avium is playing a part in this worsening but I am not certain is the only factor.  Her recent sputum cultures have been persistently positive.  She has not needed another sample today.  If positive for Mycobacterium avium again I will order repeat antibiotic susceptibilities.  However, based on discussions with the doctors at [facility] in [place], [place] I am not sure that there is much more we can offer in terms of antibiotic therapy.  She will follow-up here in 2 months.

## 2020-02-21 NOTE — Telephone Encounter (Signed)
Requested refill for clofazimine from [facility]. Medication should arrive to clinic in 7-10 business days. Will update encounter and patient when medication arrives.  

## 2020-02-21 NOTE — Progress Notes (Signed)
[facility]  Patient Active Problem List   Diagnosis Date Noted  . 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 BRONCHIECTASIS 05/23/2009    Priority: Medium  . Unintentional weight loss 05/17/2019  . Dysphonia 01/11/2018  . Diarrhea 01/11/2018  . Right shoulder pain 01/21/2017  . Encounter for hepatitis C screening test for low risk patient 01/21/2017  . GERD (gastroesophageal reflux disease) 02/21/2016  . 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 Dyslipidemia 11/14/2014  . Allergic rhinitis 05/03/2009    Patient's Medications  New Prescriptions   No medications on file  Previous Medications   ACYCLOVIR (ZOVIRAX) 400 MG TABLET    Take 400 mg by mouth 2 (two) times daily.   ALBUTEROL (PROVENTIL HFA;VENTOLIN HFA) 108 (90 BASE) MCG/ACT INHALER    Inhale 2 puffs into the lungs every 6 (six) hours as needed for wheezing or shortness of breath.   ARIKAYCE 590 MG/8.4ML SUSP    Inhale the contents of one vial (590mg ) via Lamira device 1 time daily as directed.   ATORVASTATIN (LIPITOR) 20 MG TABLET    Take 20 mg by mouth daily.   B COMPLEX VITAMINS (B COMPLEX-B12 PO)    Take 1 tablet by mouth daily.   BENZONATATE (TESSALON) 100 MG CAPSULE    benzonatate 100 mg capsule   BIOTIN 5000 PO    Take 2 capsules by mouth daily.   CALCIUM CARBONATE-VITAMIN D (CALCIUM-VITAMIN D3) 600-200 MG-UNIT TABS    Take 1 tablet by mouth 2 (two) times daily.     CETIRIZINE (ZYRTEC) 10 MG TABLET    every morning.   CHOLECALCIFEROL (VITAMIN D3) 1000 UNITS CAPS    Take 2,000 Units by mouth daily.   CLOFAZIMINE PO    Take 2 tablets by mouth daily.   ETHAMBUTOL (MYAMBUTOL) 400 MG TABLET    Take 3 tablets (1,200 mg total) by mouth daily.   FLAXSEED, LINSEED, (FLAX SEED OIL) 1300 MG CAPS    Take by mouth daily.   GLUCOSAMINE-CHONDROITIN 1500-1200 MG/30ML LIQD    Take 1 tablet by mouth daily.   HYDROCORTISONE (PROCTOZONE-HC) 2.5 % RECTAL CREAM     Proctozone-HC 2.5 % topical cream perineal applicator   HYDROCORTISONE 2.5 % CREAM    hydrocortisone 2.5 % topical cream  APPLY A THIN LAYER TO THE  AFFECTED AREA THREE TIMES DAILY AS NEEDED   MELATONIN 10 MG CAPS    10 mg nightly as needed.   OMEPRAZOLE (PRILOSEC) 40 MG CAPSULE    Take 1 capsule (40 mg total) by mouth 2 (two) times daily.   PROBIOTIC PRODUCT (PROBIOTIC DAILY PO)    Take 1 capsule by mouth daily.   RESPIRATORY THERAPY SUPPLIES (FLUTTER) DEVI    Use as directed.   RIFAMPIN (RIFADIN) 300 MG CAPSULE    TAKE 2 CAPSULES EVERY DAY   SODIUM CHLORIDE HYPERTONIC 3 % NEBULIZER SOLUTION    41mL via nebulizer BID.  DX: J47.9   SPACER/AERO-HOLDING CHAMBERS (AEROCHAMBER MV) INHALER    Use as instructed   TURMERIC PO    Take 2,000 mg by mouth daily.    VITAMIN B-12 (CYANOCOBALAMIN) 1000 MCG TABLET    Take 2,000 mcg by mouth daily.  Modified Medications   No medications on file  Discontinued Medications   No medications on file    Subjective: Kiara Medina is in for her routine follow-up visit.  She is now completed 26 months of therapy for relapsed, antibiotic  resistant Mycobacterium avium pneumonia.  She has been receiving oral ethambutol, rifampin and clofazimine along with aerosolized amikacin.  Her chronic cough is worsening, especially at night after dinner.  She continues to take her omeprazole twice daily but recently has had some more epigastric burning pain after dinner at night.  She took some of her husband's Pepcid AC and noted that the burning pain went away.  She does not cough nearly as much in the morning.  Her cough is not very productive but yesterday she did cough up a few flecks of blood.  She says that she has felt a little more short of breath with exertion.  She went to [facility] recently for the first time in several months and was worried that she might not be able to get out of the store and back to her car without sitting down.  She does not have an oxygen saturation meter at home.   Her appetite is only fair and she has lost another pound recently.  Review of Systems: Review of Systems  Constitutional: Positive for malaise/fatigue and weight loss. Negative for chills, diaphoresis and fever.  HENT: Positive for hearing loss and tinnitus.        She has had no change in her hearing loss or tinnitus since starting on amikacin and her audiograms have not revealed any acute changes.  Respiratory: Positive for cough, hemoptysis, sputum production, shortness of breath and wheezing.   Cardiovascular: Negative for chest pain.  Gastrointestinal: Positive for heartburn. Negative for abdominal pain, diarrhea, nausea and vomiting.       She has frequent soft stools, especially in the morning after taking her Mycobacterium avium medicines.  This is unchanged.  Musculoskeletal: Positive for joint pain.    Past Medical History:  Diagnosis Date  . Allergic rhinitis, cause unspecified   . Bronchiectasis without acute exacerbation ([place])   . Cough   . Sinusitis     Social History   Tobacco Use  . Smoking status: Former Smoker    Packs/day: 1.00    Years: 20.00    Pack years: 20.00    Types: Cigarettes    Quit date: 12/14/1985    Years since quitting: 34.2  . Smokeless tobacco: Never Used  Substance Use Topics  . Alcohol use: Yes    Alcohol/week: 1.0 standard drinks    Types: 1 Glasses of wine per week    Comment: occ  . Drug use: No    Family History  Problem Relation Age of Onset  . Emphysema Sister   . Stroke Mother   . Clotting disorder Mother     No Known Allergies  Objective: Vitals:   02/21/20 1525  BP: 130/75  Pulse: (!) 109  Temp: (!) 97.5 F (36.4 C)  TempSrc: Oral  Weight: 128 lb (58.1 kg)   Body mass index is 22.32 kg/m.  Physical Exam Constitutional:      Comments: Her weight is down a pound a half from the last visit.  Cardiovascular:     Rate and Rhythm: Normal rate and regular rhythm.     Heart sounds: No murmur.  Pulmonary:      Effort: Pulmonary effort is normal.     Breath sounds: Wheezing and rales present. No rhonchi.     Comments: She has a few crackles and faint wheezes in her bases posteriorly.  Resting O2 sat on room air was 97%.  It dropped to 96% while walking around the clinic. Abdominal:  Palpations: Abdomen is soft.     Tenderness: There is no abdominal tenderness.  Skin:    Findings: No rash.  Neurological:     General: No focal deficit present.  Psychiatric:        Mood and Affect: Mood normal.     Lab Results CMP     Component Value Date/Time   NA 140 11/23/2019 1608   K 4.4 11/23/2019 1608   CL 104 11/23/2019 1608   CO2 28 11/23/2019 1608   GLUCOSE 104 (H) 11/23/2019 1608   BUN 14 11/23/2019 1608   CREATININE 0.94 (H) 11/23/2019 1608   CALCIUM 9.3 11/23/2019 1608   PROT 6.1 11/23/2019 1608   ALBUMIN 3.8 04/23/2015 1057   AST 22 11/23/2019 1608   ALT 18 11/23/2019 1608   ALKPHOS 55 04/23/2015 1057   BILITOT 0.3 11/23/2019 1608   GFRNONAA >60 06/01/2011 1457   GFRAA >60 06/01/2011 1457   Lab Results  Component Value Date   WBC 6.9 11/23/2019   HGB 14.2 11/23/2019   HCT 43.0 11/23/2019   MCV 96.2 11/23/2019   PLT 186 11/23/2019   Sputum AFB culture 05/17/2019; + for Mycobacterium avium again  Chest x-ray 05/17/2019 IMPRESSION: Stable chronic lung disease.  No acute cardiopulmonary abnormality.    By: Genevie Ann M.D.   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  Certainly suspect that Mycobacterium avium is playing a part in this worsening but I am not certain is the only factor.  Her recent sputum cultures have been persistently positive.  She has not needed another sample today.  If positive for Mycobacterium avium again I will order repeat antibiotic susceptibilities.  However,  based on discussions with the doctors at [facility] in [place], [place] I am not sure that there is much more we can offer in terms of antibiotic therapy.  She will follow-up here in 2 months.        Medium   BRONCHIECTASIS    She has fairly severe underlying bronchiectasis.  Her pulmonologist, Dr. Carlis Abbott, recently obtained quantitative immunoglobulin levels and they were normal.  I suggested that she obtain a home O2 sat monitor to use when she develops dyspnea on exertion.        Unprioritized   GERD (gastroesophageal reflux disease)    I wonder if acid reflux could be contributing to her nighttime cough.  I suggested that she try doing without her nighttime cup of coffee.  I also suggested that she discuss management of her acid reflux with her PCP, Dr. Delena Bali.          Michel Bickers, MD [facility] [phone] pager   [phone] cell 02/21/2020, 4:08 PM

## 2020-02-21 NOTE — Assessment & Plan Note (Addendum)
She has fairly severe underlying bronchiectasis.  Her pulmonologist, Dr. Carlis Abbott, recently obtained quantitative immunoglobulin levels and they were normal.  I suggested that she obtain a home O2 sat monitor to use when she develops dyspnea on exertion.

## 2020-02-21 NOTE — Assessment & Plan Note (Signed)
I wonder if acid reflux could be contributing to her nighttime cough.  I suggested that she try doing without her nighttime cup of coffee.  I also suggested that she discuss management of her acid reflux with her PCP, Dr. Delena Bali.

## 2020-02-22 NOTE — Telephone Encounter (Signed)
Patient's refill arrived. She picked them up in clinic on 3/10.

## 2020-03-01 ENCOUNTER — Other Ambulatory Visit: Payer: Self-pay | Admitting: *Deleted

## 2020-03-01 DIAGNOSIS — A310 Pulmonary mycobacterial infection: Secondary | ICD-10-CM

## 2020-03-01 DIAGNOSIS — R05 Cough: Secondary | ICD-10-CM

## 2020-03-01 DIAGNOSIS — J479 Bronchiectasis, uncomplicated: Secondary | ICD-10-CM

## 2020-03-01 DIAGNOSIS — R059 Cough, unspecified: Secondary | ICD-10-CM

## 2020-03-01 MED ORDER — BENZONATATE 100 MG PO CAPS: 100.0000 mg | ORAL_CAPSULE | Freq: Three times a day (TID) | ORAL | 5 refills | Status: DC | PRN

## 2020-03-01 NOTE — Progress Notes (Signed)
Patient requested refill.  Reordered per original order sent in clinic 10/10/18. Landis Gandy, RN

## 2020-03-04 ENCOUNTER — Other Ambulatory Visit: Payer: Self-pay | Admitting: Internal Medicine

## 2020-03-04 DIAGNOSIS — A310 Pulmonary mycobacterial infection: Secondary | ICD-10-CM

## 2020-03-19 ENCOUNTER — Other Ambulatory Visit: Payer: Self-pay | Admitting: Pulmonary Disease

## 2020-03-27 ENCOUNTER — Encounter: Payer: Self-pay | Admitting: Critical Care Medicine

## 2020-03-27 ENCOUNTER — Ambulatory Visit: Payer: Medicare HMO | Admitting: Critical Care Medicine

## 2020-03-27 ENCOUNTER — Other Ambulatory Visit: Payer: Self-pay

## 2020-03-27 VITALS — BP 108/76 | HR 96 | Temp 98.1°F | Ht 63.0 in | Wt 126.2 lb

## 2020-03-27 DIAGNOSIS — R062 Wheezing: Secondary | ICD-10-CM | POA: Diagnosis not present

## 2020-03-27 DIAGNOSIS — J479 Bronchiectasis, uncomplicated: Secondary | ICD-10-CM | POA: Diagnosis not present

## 2020-03-27 DIAGNOSIS — R0602 Shortness of breath: Secondary | ICD-10-CM

## 2020-03-27 MED ORDER — STIOLTO RESPIMAT 2.5-2.5 MCG/ACT IN AERS: 2.0000 | INHALATION_SPRAY | Freq: Every day | RESPIRATORY_TRACT | 0 refills | Status: DC

## 2020-03-27 MED ORDER — STIOLTO RESPIMAT 2.5-2.5 MCG/ACT IN AERS: 2.0000 | INHALATION_SPRAY | Freq: Every day | RESPIRATORY_TRACT | 3 refills | Status: DC

## 2020-03-27 NOTE — Progress Notes (Signed)
Synopsis: Referred in 2010 for bronchiectasis by Nicoletta Dress, MD.  Previously patient of Dr. Normajean Baxter and Dr. Lake Bells.  Subjective:   PATIENT ID: Kiara Medina GENDER: female DOB: [birth year], MRN: [id number]  Chief Complaint  Patient presents with  . Follow-up    Patient states that her breathing has got worse since last visit, exertion makes it worse. Patient has dry/productive cough.     Kiara Medina is a 74 year old woman with a history of bronchiectasis and persistent MAI with multiple drug resistance.  She follows with Dr. Megan Salon in infectious disease-recent note from 02/21/2020 reviewed.  She is feeling worse than her last visit.  She is worse dyspnea on exertion, increased coughing and sputum production.  She notices more wheezing, especially at night.  She is short of breath with usual activity, just walking to her mailbox and back.  Even sitting down when she is gardening she notices more shortness of breath.  She has saturations at she is seen as low as 89%, but they are usually 90 to 92% and improved with rest.  She is using her hypertonic saline nebs once daily.  She continues on her current NTM regimen including inhaled amikacin.  The past few months she has lost about 2 pounds. She has less energy than normal.  No fevers or chills.    OV 12/28/19: Kiara Medina is a 74 y/o woman being seen in follow up of chronic bronchiectasis and MAC. She was diagnosed with MAC in 2015, and was originally treated with 3-drug RAE therapy 3 days per week for about a year with negative cultures. She remained off therapy until~2 years ago when she had a recurrence. She is now on 4-drug daily therapy (ethambutol, rifampin, clofazamine, inhaled liposomal amikacin) with a MDR strain of MAC. She has persistently positive cultures. She follows with Dr. Megan Salon in [place]. She has had worse SOB for about 6 months, but attributes this to being at home more and going out less. She has more trouble walking around  without getting SOB. She has more trouble breathing at the temperature extremes. She denies wheezing. She has significant sputum production, ranging from white to yellow, sometimes with steaks of blood. Her appetite is poor and she has continued to slowly lose weight over time. She has not been drinking Ensure due to disliking the taste/ texture, but she has been trying to get protein in her diet. She no longer has night sweats, denies fevers, and she has no issues sleeping. She uses her hypertonic saline nebs once daily before using her inhaled amikacin. She uses her flutter valve PRN, mostly when she feels that she is having trouble clearing her mucus.  She is scheduled to receive her covid vaccine next week [facility]). Former smoker-quit 1987, 25-pack-year history      Past Medical History:  Diagnosis Date  . Allergic rhinitis, cause unspecified   . Bronchiectasis without acute exacerbation ([place])   . Cough   . Sinusitis      Family History  Problem Relation Age of Onset  . Emphysema Sister   . Stroke Mother   . Clotting disorder Mother      Past Surgical History:  Procedure Laterality Date  . APPENDECTOMY    . CATARACT EXTRACTION  08/2013  . CHOLECYSTECTOMY    . TUBAL LIGATION    . VESICOVAGINAL FISTULA CLOSURE W/ TAH      Social History   Socioeconomic History  . Marital status: Married    Spouse name:  Not on file  . Number of children: Not on file  . Years of education: Not on file  . Highest education level: Not on file  Occupational History  . Occupation: realtor  Tobacco Use  . Smoking status: Former Smoker    Packs/day: 1.00    Years: 20.00    Pack years: 20.00    Types: Cigarettes    Quit date: 12/14/1985    Years since quitting: 34.3  . Smokeless tobacco: Never Used  Substance and Sexual Activity  . Alcohol use: Yes    Alcohol/week: 1.0 standard drinks    Types: 1 Glasses of wine per week    Comment: occ  . Drug use: No  . Sexual activity: Not  on file  Other Topics Concern  . Not on file  Social History Narrative  . Not on file   Social Determinants of Health   Financial Resource Strain:   . Difficulty of Paying Living Expenses:   Food Insecurity:   . Worried About Charity fundraiser in the Last Year:   . Arboriculturist in the Last Year:   Transportation Needs:   . Film/video editor (Medical):   Marland Kitchen Lack of Transportation (Non-Medical):   Physical Activity:   . Days of Exercise per Week:   . Minutes of Exercise per Session:   Stress:   . Feeling of Stress :   Social Connections:   . Frequency of Communication with Friends and Family:   . Frequency of Social Gatherings with Friends and Family:   . Attends Religious Services:   . Active Member of Clubs or Organizations:   . Attends Archivist Meetings:   Marland Kitchen Marital Status:   Intimate Partner Violence:   . Fear of Current or Ex-Partner:   . Emotionally Abused:   Marland Kitchen Physically Abused:   . Sexually Abused:      No Known Allergies   Immunization History  Administered Date(s) Administered  . Influenza Split 09/13/2012, 09/06/2014  . Influenza Whole 09/09/2009, 08/14/2010, 09/14/2011  . Influenza, High Dose Seasonal PF 10/10/2015  . Influenza,inj,Quad PF,6+ Mos 09/13/2013, 09/21/2016, 08/26/2017, 08/22/2018  . Influenza-Unspecified 08/29/2019  . Moderna SARS-COVID-2 Vaccination 01/03/2020, 01/31/2020  . Pneumococcal Conjugate-13 11/24/2014  . Pneumococcal Polysaccharide-23 07/17/2009    Outpatient Medications Prior to Visit  Medication Sig Dispense Refill  . acyclovir (ZOVIRAX) 400 MG tablet Take 400 mg by mouth 2 (two) times daily.    Marland Kitchen albuterol (PROVENTIL HFA;VENTOLIN HFA) 108 (90 Base) MCG/ACT inhaler Inhale 2 puffs into the lungs every 6 (six) hours as needed for wheezing or shortness of breath. 1 Inhaler 0  . ARIKAYCE 590 MG/8.4ML SUSP Inhale the contents of one vial (560m) via Lamira device 1 time daily as directed. 8.4 mL 2  .  atorvastatin (LIPITOR) 20 MG tablet Take 20 mg by mouth daily.    . B Complex Vitamins (B COMPLEX-B12 PO) Take 1 tablet by mouth daily.    . benzonatate (TESSALON PERLES) 100 MG capsule Take 1 capsule (100 mg total) by mouth 3 (three) times daily as needed for cough. 90 capsule 5  . Calcium Carbonate-Vitamin D (CALCIUM-VITAMIN D3) 600-200 MG-UNIT TABS Take 1 tablet by mouth 2 (two) times daily.      . cetirizine (ZYRTEC) 10 MG tablet every morning.    . Cholecalciferol (VITAMIN D3) 1000 UNITS CAPS Take 2,000 Units by mouth daily.    . CLOFAZIMINE PO Take 2 tablets by mouth daily.    .Marland Kitchenethambutol (  MYAMBUTOL) 400 MG tablet TAKE 3 TABLETS (1,200 MG TOTAL) BY MOUTH DAILY. 270 tablet 1  . Flaxseed, Linseed, (FLAX SEED OIL) 1300 MG CAPS Take by mouth daily.    . Glucosamine-Chondroitin 1500-1200 MG/30ML LIQD Take 1 tablet by mouth daily.    . hydrocortisone (PROCTOZONE-HC) 2.5 % rectal cream Proctozone-HC 2.5 % topical cream perineal applicator    . hydrocortisone 2.5 % cream hydrocortisone 2.5 % topical cream  APPLY A THIN LAYER TO THE  AFFECTED AREA THREE TIMES DAILY AS NEEDED    . Melatonin 10 MG CAPS 10 mg nightly as needed.    Marland Kitchen omeprazole (PRILOSEC) 40 MG capsule Take 1 capsule (40 mg total) by mouth 2 (two) times daily. 180 capsule 1  . Probiotic Product (PROBIOTIC DAILY PO) Take 1 capsule by mouth daily.    Marland Kitchen Respiratory Therapy Supplies (FLUTTER) DEVI Use as directed. 1 each 0  . rifampin (RIFADIN) 300 MG capsule TAKE 2 CAPSULES EVERY DAY 180 capsule 1  . sodium chloride HYPERTONIC 3 % nebulizer solution INHALE THE CONTENTS OF 1 VIAL ( 4ML ) VIA NEBULIZER TWICE DAILY 720 mL 3  . Spacer/Aero-Holding Chambers (AEROCHAMBER MV) inhaler Use as instructed 1 each 2  . TURMERIC PO Take 2,000 mg by mouth daily.     . vitamin B-12 (CYANOCOBALAMIN) 1000 MCG tablet Take 2,000 mcg by mouth daily.    Marland Kitchen BIOTIN 5000 PO Take 2 capsules by mouth daily.     No facility-administered medications prior to  visit.    Review of Systems  Constitutional: Positive for weight loss. Negative for fever.  Respiratory: Positive for cough, hemoptysis, sputum production and shortness of breath. Negative for wheezing.   Cardiovascular: Negative for chest pain.  Gastrointestinal: Negative for nausea and vomiting.     Objective:   Vitals:   03/27/20 1454  BP: 108/76  Pulse: 96  Temp: 98.1 F (36.7 C)  TempSrc: Temporal  SpO2: 99%  Weight: 126 lb 3.2 oz (57.2 kg)  Height: _0  (1.6 m)   99% on   RA BMI Readings from Last 3 Encounters:  03/27/20 22.36 kg/m  02/21/20 22.32 kg/m  12/28/19 22.63 kg/m   Wt Readings from Last 3 Encounters:  03/27/20 126 lb 3.2 oz (57.2 kg)  02/21/20 128 lb (58.1 kg)  12/28/19 129 lb 12.8 oz (58.9 kg)    Physical Exam Vitals reviewed.  Constitutional:      Appearance: Normal appearance. She is not ill-appearing.  HENT:     Head: Normocephalic and atraumatic.  Eyes:     General: No scleral icterus. Cardiovascular:     Rate and Rhythm: Normal rate and regular rhythm.  Pulmonary:     Comments: Inspiratory squeaks, expiratory wheezing and rhonchi. Breathing comfortably on RA. Mild conversational dyspnea. Abdominal:     General: There is no distension.     Palpations: Abdomen is soft.  Musculoskeletal:        General: No swelling or deformity.     Cervical back: Neck supple.  Lymphadenopathy:     Cervical: No cervical adenopathy.  Skin:    General: Skin is warm and dry.     Findings: No rash.  Neurological:     General: No focal deficit present.     Mental Status: She is alert.     Coordination: Coordination normal.  Psychiatric:        Mood and Affect: Mood normal.        Behavior: Behavior normal.      CBC  Component Value Date/Time   WBC 6.9 11/23/2019 1608   RBC 4.47 11/23/2019 1608   HGB 14.2 11/23/2019 1608   HCT 43.0 11/23/2019 1608   PLT 186 11/23/2019 1608   MCV 96.2 11/23/2019 1608   MCH 31.8 11/23/2019 1608   MCHC  33.0 11/23/2019 1608   RDW 12.7 11/23/2019 1608   LYMPHSABS 1,929 01/10/2019 1657   EOSABS 289 01/10/2019 1657   BASOSABS 18 01/10/2019 1657    CHEMISTRY No results for input(s): NA, K, CL, CO2, GLUCOSE, BUN, CREATININE, CALCIUM, MG, PHOS in the last 168 hours. CrCl cannot be calculated (Patient's most recent lab result is older than the maximum 21 days allowed.).  12/28/2019: IgG 954 IgM 96 IgA 289 IgE 10 IgG subclasses normal (other than increased IgG subclass 3)  Cultures: 11/23/2019-MAI 05/17/2019-MAI 03/15/2019-MAI 01/10/2019 MAI 11/07/2018-MAI 10/10/2018-MAI  08/22/2018-MAI 07/11/2018-MAI 05/31/2018-MAI MAI susceptibilities-resistant clarithromycin, linezolid.  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  Susceptible to amikacin, cefepime, clarithromycin, imipenem, linezolid, trimethoprim sulfamethoxazole. 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  Compared to 2015 CXR, more disease in the lower lobes.  CT chest high-resolution 01/29/2014- nodules throughout, a few starting to cavitate.  Most disease in bilateral upper lobes, RML, lingula.  Lower lobes relatively preserved.  Pulmonary Functions Testing Results: PFT Results Latest Ref Rng & Units 04/18/2018  FVC-Pre L 2.08  FVC-Predicted Pre % 79  Pre FEV1/FVC % % 75  FEV1-Pre L 1.57  FEV1-Predicted Pre % 80   No significant obstruction, mildly reduced FVC.     Assessment & Plan:     ICD-10-CM   1. Wheezing  R06.2 Respiratory or Resp and Sputum Culture  2. Shortness of breath  R06.02 CT Chest High Resolution     Respiratory or Resp and Sputum Culture  3. 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  No obvious immunodeficiencies. -Increase hypertonic saline nebs with flutter valve to twice daily.  If her symptoms are worse than baseline she should likely increase this to 3 times daily. -Continue current 4 drug MAI therapy and ID follow-up.  I agree with Dr. Megan Salon that she has limited treatment options. -HRCT chest to evaluate for rare complication of hypersensitivity pneumonitis from inhaled amikacin -Start Stiolto once daily.  Sample inhaler with training provided. -Continued efforts to halt weight loss and maintain healthy weight nutritional status. -May need to repeat sputum cultures to evaluate for non-NTM causes of progressive symptoms that are not being addressed. -Avoid ICS and steroids -Up-to-date on Covid, pneumonia, flu vaccines.  RTC in 1 month.     Current Outpatient Medications:  .  acyclovir (ZOVIRAX) 400 MG tablet, Take 400 mg by mouth 2 (two) times daily., Disp: , Rfl:  .  albuterol (PROVENTIL HFA;VENTOLIN HFA) 108 (90 Base) MCG/ACT inhaler, Inhale 2 puffs into the lungs every 6 (six) hours as needed for wheezing or shortness of breath., Disp: 1 Inhaler, Rfl: 0 .  ARIKAYCE 590 MG/8.4ML SUSP, Inhale the contents of one vial (552m) via Lamira device 1 time daily as directed., Disp: 8.4 mL, Rfl: 2 .  atorvastatin (LIPITOR) 20 MG tablet, Take 20 mg by mouth daily., Disp: , Rfl:  .  B  Complex Vitamins (B COMPLEX-B12 PO), Take 1 tablet by mouth daily., Disp: , Rfl:  .  benzonatate (TESSALON PERLES) 100 MG capsule, Take 1 capsule (100 mg total) by mouth 3 (three) times daily as needed for cough., Disp: 90 capsule, Rfl: 5 .  Calcium Carbonate-Vitamin D (CALCIUM-VITAMIN D3) 600-200 MG-UNIT TABS, Take 1 tablet by mouth 2 (two) times daily.  , Disp: , Rfl:  .  cetirizine  (ZYRTEC) 10 MG tablet, every morning., Disp: , Rfl:  .  Cholecalciferol (VITAMIN D3) 1000 UNITS CAPS, Take 2,000 Units by mouth daily., Disp: , Rfl:  .  CLOFAZIMINE PO, Take 2 tablets by mouth daily., Disp: , Rfl:  .  ethambutol (MYAMBUTOL) 400 MG tablet, TAKE 3 TABLETS (1,200 MG TOTAL) BY MOUTH DAILY., Disp: 270 tablet, Rfl: 1 .  Flaxseed, Linseed, (FLAX SEED OIL) 1300 MG CAPS, Take by mouth daily., Disp: , Rfl:  .  Glucosamine-Chondroitin 1500-1200 MG/30ML LIQD, Take 1 tablet by mouth daily., Disp: , Rfl:  .  hydrocortisone (PROCTOZONE-HC) 2.5 % rectal cream, Proctozone-HC 2.5 % topical cream perineal applicator, Disp: , Rfl:  .  hydrocortisone 2.5 % cream, hydrocortisone 2.5 % topical cream  APPLY A THIN LAYER TO THE  AFFECTED AREA THREE TIMES DAILY AS NEEDED, Disp: , Rfl:  .  Melatonin 10 MG CAPS, 10 mg nightly as needed., Disp: , Rfl:  .  omeprazole (PRILOSEC) 40 MG capsule, Take 1 capsule (40 mg total) by mouth 2 (two) times daily., Disp: 180 capsule, Rfl: 1 .  Probiotic Product (PROBIOTIC DAILY PO), Take 1 capsule by mouth daily., Disp: , Rfl:  .  Respiratory Therapy Supplies (FLUTTER) DEVI, Use as directed., Disp: 1 each, Rfl: 0 .  rifampin (RIFADIN) 300 MG capsule, TAKE 2 CAPSULES EVERY DAY, Disp: 180 capsule, Rfl: 1 .  sodium chloride HYPERTONIC 3 % nebulizer solution, INHALE THE CONTENTS OF 1 VIAL ( 4ML ) VIA NEBULIZER TWICE DAILY, Disp: 720 mL, Rfl: 3 .  Spacer/Aero-Holding Chambers (AEROCHAMBER MV) inhaler, Use as instructed, Disp: 1 each, Rfl: 2 .  TURMERIC PO, Take 2,000 mg by mouth daily. , Disp: , Rfl:  .  vitamin B-12 (CYANOCOBALAMIN) 1000 MCG tablet, Take 2,000 mcg by mouth daily., Disp: , Rfl:  .  Tiotropium Bromide-Olodaterol (STIOLTO RESPIMAT) 2.5-2.5 MCG/ACT AERS, Inhale 2 puffs into the lungs daily., Disp: 12 g, Rfl: [place] Chevette Fee, DO [facility] 03/27/2020 3:23 PM

## 2020-03-27 NOTE — Patient Instructions (Addendum)
Thank you for visiting Dr. Carlis Abbott at [facility]. We recommend the following: Orders Placed This Encounter  Procedures  . Respiratory or Resp and Sputum Culture  . CT Chest High Resolution   Orders Placed This Encounter  Procedures  . Respiratory or Resp and Sputum Culture    Standing Status:   Future    Standing Expiration Date:   03/27/2021  . CT Chest High Resolution    First available    Standing Status:   Future    Standing Expiration Date:   05/27/2021    Order Specific Question:   ** REASON FOR EXAM (FREE TEXT)    Answer:   worse SOB on inhaled amikacin, concern for possible HP complicating chronic MAC    Order Specific Question:   Preferred imaging location?    Answer:   [facility]    Order Specific Question:   Radiology Contrast Protocol - do NOT remove file path    Answer:   [email or web address]    Meds ordered this encounter  Medications  . Tiotropium Bromide-Olodaterol (STIOLTO RESPIMAT) 2.5-2.5 MCG/ACT AERS    Sig: Inhale 2 puffs into the lungs daily.    Dispense:  12 g    Refill:  3    Return in about 4 weeks (around 04/24/2020).    Please do your part to reduce the spread of COVID-19.

## 2020-04-01 ENCOUNTER — Ambulatory Visit (HOSPITAL_COMMUNITY): Payer: Medicare HMO

## 2020-04-02 ENCOUNTER — Telehealth: Payer: Self-pay | Admitting: Critical Care Medicine

## 2020-04-02 NOTE — Telephone Encounter (Signed)
Pt calling back in regards to preauthro for CT scan. [phone]

## 2020-04-03 LAB — MYCOBACTERIA,CULT W/FLUOROCHROME SMEAR
MICRO NUMBER:: 10239030
SPECIMEN QUALITY:: ADEQUATE

## 2020-04-03 NOTE — Telephone Encounter (Signed)
Pt calling back about this please return call

## 2020-04-03 NOTE — Telephone Encounter (Signed)
Spoke to pt she is aware ct has been authorized [employer]

## 2020-04-04 ENCOUNTER — Ambulatory Visit (HOSPITAL_COMMUNITY): Payer: Medicare HMO

## 2020-04-04 ENCOUNTER — Other Ambulatory Visit: Payer: Medicare HMO

## 2020-04-04 DIAGNOSIS — R062 Wheezing: Secondary | ICD-10-CM

## 2020-04-04 DIAGNOSIS — R0602 Shortness of breath: Secondary | ICD-10-CM | POA: Diagnosis not present

## 2020-04-04 DIAGNOSIS — J479 Bronchiectasis, uncomplicated: Secondary | ICD-10-CM

## 2020-04-07 LAB — RESPIRATORY CULTURE OR RESPIRATORY AND SPUTUM CULTURE
MICRO NUMBER:: 10394581
RESULT:: NORMAL
SPECIMEN QUALITY:: ADEQUATE

## 2020-04-08 ENCOUNTER — Telehealth: Payer: Self-pay | Admitting: Pharmacist

## 2020-04-08 NOTE — Telephone Encounter (Signed)
2 bottles of clofazimine are available in the pharmacy office for patient when she needs them.

## 2020-04-08 NOTE — Progress Notes (Signed)
Please let Kiara Medina know that her sputum culture was negative.  LCP

## 2020-04-10 ENCOUNTER — Ambulatory Visit (HOSPITAL_COMMUNITY)
Admission: RE | Admit: 2020-04-10 | Discharge: 2020-04-10 | Disposition: A | Discharge status: Home / Self Care | Payer: Medicare HMO | Source: Ambulatory Visit | Attending: Critical Care Medicine | Admitting: Critical Care Medicine

## 2020-04-10 ENCOUNTER — Other Ambulatory Visit: Payer: Self-pay

## 2020-04-10 ENCOUNTER — Encounter (HOSPITAL_COMMUNITY): Payer: Self-pay

## 2020-04-10 DIAGNOSIS — R0602 Shortness of breath: Secondary | ICD-10-CM

## 2020-04-12 ENCOUNTER — Telehealth: Payer: Self-pay | Admitting: Critical Care Medicine

## 2020-04-12 NOTE — Telephone Encounter (Signed)
Spoke with pt. She is requesting her CT results from 04/10/2020.  Dr. Carlis Abbott - please advise. Thanks.

## 2020-04-12 NOTE — Telephone Encounter (Signed)
Spoke with pt. She is aware of results. Nothing further was needed.  

## 2020-04-12 NOTE — Telephone Encounter (Signed)
Please let her know that her CT shows progressive changes from MAC but nothing that looks like a complication from the inhaled medication she is on for MAC (the inhaled amikacin). She can go back on this/ continue taking this because I think it will help as much as anything.   Julian Hy, DO 04/12/20 2:42 PM [facility]

## 2020-04-18 ENCOUNTER — Other Ambulatory Visit: Payer: Self-pay

## 2020-04-18 ENCOUNTER — Ambulatory Visit: Payer: Medicare HMO | Admitting: Internal Medicine

## 2020-04-18 DIAGNOSIS — A310 Pulmonary mycobacterial infection: Secondary | ICD-10-CM

## 2020-04-18 NOTE — Assessment & Plan Note (Signed)
Kiara Medina is improving clinically coincident with starting Stolto.  Although her chest CT showed some progression that was in comparison to a scan done over 6 years ago the changes probably do not represent acute worsening of Mycobacterium avium infection.  She will continue her current 4 drug regimen and follow-up in 3 months.

## 2020-04-18 NOTE — Progress Notes (Signed)
[facility]  Patient Active Problem List   Diagnosis Date Noted  . 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 BRONCHIECTASIS 05/23/2009    Priority: Medium  . Unintentional weight loss 05/17/2019  . Dysphonia 01/11/2018  . Diarrhea 01/11/2018  . Right shoulder pain 01/21/2017  . Encounter for hepatitis C screening test for low risk patient 01/21/2017  . GERD (gastroesophageal reflux disease) 02/21/2016  . 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 Dyslipidemia 11/14/2014  . Allergic rhinitis 05/03/2009    Patient's Medications  New Prescriptions   No medications on file  Previous Medications   ACYCLOVIR (ZOVIRAX) 400 MG TABLET    Take 400 mg by mouth 2 (two) times daily.   ALBUTEROL (PROVENTIL HFA;VENTOLIN HFA) 108 (90 BASE) MCG/ACT INHALER    Inhale 2 puffs into the lungs every 6 (six) hours as needed for wheezing or shortness of breath.   ARIKAYCE 590 MG/8.4ML SUSP    Inhale the contents of one vial (540m) via Lamira device 1 time daily as directed.   ASCORBIC ACID (VITAMIN C) 500 MG TABLET    Take 500 mg by mouth daily.   ATORVASTATIN (LIPITOR) 20 MG TABLET    Take 20 mg by mouth daily.   B COMPLEX VITAMINS (B COMPLEX-B12 PO)    Take 1 tablet by mouth daily.   BENZONATATE (TESSALON PERLES) 100 MG CAPSULE    Take 1 capsule (100 mg total) by mouth 3 (three) times daily as needed for cough.   CALCIUM CARBONATE (OSCAL) 1500 (600 CA) MG TABS TABLET    Take 600 mg of elemental calcium by mouth 2 (two) times daily with a meal.   CETIRIZINE (ZYRTEC) 10 MG TABLET    every morning.   CHOLECALCIFEROL (VITAMIN D3) 125 MCG (5000 UT) TABS    Take 1 tablet by mouth.   CLOFAZIMINE PO    Take 2 tablets by mouth daily.   ETHAMBUTOL (MYAMBUTOL) 400 MG TABLET    TAKE 3 TABLETS (1,200 MG TOTAL) BY MOUTH DAILY.   FLAXSEED, LINSEED, (FLAX SEED OIL) 1300 MG CAPS    Take by mouth daily.   GLUCOSAMINE-CHONDROITIN 1500-1200 MG/30ML LIQD     Take 1 tablet by mouth daily.   HYDROCORTISONE (PROCTOZONE-HC) 2.5 % RECTAL CREAM    Proctozone-HC 2.5 % topical cream perineal applicator   HYDROCORTISONE 2.5 % CREAM    hydrocortisone 2.5 % topical cream  APPLY A THIN LAYER TO THE  AFFECTED AREA THREE TIMES DAILY AS NEEDED   MELATONIN 10 MG CAPS    10 mg nightly as needed.   OMEPRAZOLE (PRILOSEC) 40 MG CAPSULE    Take 1 capsule (40 mg total) by mouth 2 (two) times daily.   PROBIOTIC PRODUCT (PROBIOTIC DAILY PO)    Take 1 capsule by mouth daily.   RESPIRATORY THERAPY SUPPLIES (FLUTTER) DEVI    Use as directed.   RIFAMPIN (RIFADIN) 300 MG CAPSULE    TAKE 2 CAPSULES EVERY DAY   SODIUM CHLORIDE HYPERTONIC 3 % NEBULIZER SOLUTION    INHALE THE CONTENTS OF 1 VIAL ( 4ML ) VIA NEBULIZER TWICE DAILY   SPACER/AERO-HOLDING CHAMBERS (AEROCHAMBER MV) INHALER    Use as instructed   TIOTROPIUM BROMIDE-OLODATEROL (STIOLTO RESPIMAT) 2.5-2.5 MCG/ACT AERS    Inhale 2 puffs into the lungs daily.   TIOTROPIUM BROMIDE-OLODATEROL (STIOLTO RESPIMAT) 2.5-2.5 MCG/ACT AERS    Inhale 2 puffs into the lungs daily.   TURMERIC PO  Take 2,000 mg by mouth daily.   Modified Medications   No medications on file  Discontinued Medications   CALCIUM CARBONATE (OS-CAL) 600 MG TABS TABLET    Take 600 mg by mouth 2 (two) times daily with a meal.   CALCIUM CARBONATE-VITAMIN D (CALCIUM-VITAMIN D3) 600-200 MG-UNIT TABS    Take 1 tablet by mouth 2 (two) times daily.     CHOLECALCIFEROL (VITAMIN D3) 1000 UNITS CAPS    Take 2,000 Units by mouth daily.   VITAMIN B-12 (CYANOCOBALAMIN) 1000 MCG TABLET    Take 2,000 mcg by mouth daily.    Subjective: Brailyn is in for her routine follow-up visit.  She is now completed 28 months of therapy for relapsed, antibiotic resistant Mycobacterium avium pneumonia.  She has been receiving oral ethambutol, rifampin and clofazimine along with aerosolized amikacin.  She saw her pulmonologist, Dr. Eusebio Me, recently who started out on Stiolto.  She says  that she almost immediately started to feel better.  She is not having these severe coughing spells anymore.  She is having less shortness of breath.  She is bringing up a little more white to yellow sputum.  She says she is not having any significant problems tolerating her 3 oral antibiotics for aerosolized amikacin.  Her younger sister died recently of complications from COPD.  She is currently renovating her condominium before putting it on the market to sell.  She had a chest CT scan on 04/10/2020 which showed:  IMPRESSION: 1. Progressive peribronchovascular nodularity/nodular consolidation with associated bronchiectasis, findings likely due to worsening mycobacterium avium complex. Findings are suggestive of an alternative diagnosis (not UIP) per consensus guidelines: Diagnosis of Idiopathic Pulmonary Fibrosis: An Official ATS/ERS/JRS/ALAT Clinical Practice Guideline. [place], Iss 5, [phone], Aug 14 2017. 2. Cavitary nodule in the right upper lobe is likely related to MAC. Difficult to definitively exclude malignancy. Consider follow-up CT chest without contrast in 3 months, as clinically indicated. 3. Basilar predominant ill-defined centrilobular nodularity can be seen with an infectious bronchiolitis or possibly subacute hypersensitivity pneumonitis. 4. Aortic atherosclerosis (ICD10-I70.0). Coronary artery calcification.  Review of Systems: Review of Systems  Constitutional: Positive for malaise/fatigue and weight loss. Negative for chills, diaphoresis and fever.  HENT: Positive for hearing loss and tinnitus.        She has had no change in her hearing loss or tinnitus since starting on amikacin and her audiograms have not revealed any acute changes.  Respiratory: Positive for cough, hemoptysis, sputum production and shortness of breath. Negative for wheezing.   Cardiovascular: Negative for chest pain.  Gastrointestinal: Positive for heartburn. Negative for  abdominal pain, diarrhea, nausea and vomiting.       She has frequent soft stools, especially in the morning after taking her Mycobacterium avium medicines.  This is unchanged.  Musculoskeletal: Positive for joint pain.    Past Medical History:  Diagnosis Date  . Allergic rhinitis, cause unspecified   . Bronchiectasis without acute exacerbation ([place])   . Cough   . Sinusitis     Social History   Tobacco Use  . Smoking status: Former Smoker    Packs/day: 1.00    Years: 20.00    Pack years: 20.00    Types: Cigarettes    Quit date: 12/14/1985    Years since quitting: 34.3  . Smokeless tobacco: Never Used  Substance Use Topics  . Alcohol use: Yes    Alcohol/week: 1.0 standard drinks    Types: 1 Glasses of  wine per week    Comment: occ  . Drug use: No    Family History  Problem Relation Age of Onset  . Emphysema Sister   . Stroke Mother   . Clotting disorder Mother     No Known Allergies  Objective: Vitals:   04/18/20 1517  BP: 105/67  Pulse: 89  Temp: 97.9 F (36.6 C)  TempSrc: Oral  SpO2: 97%  Weight: 126 lb (57.2 kg)   Body mass index is 22.32 kg/m.  Physical Exam Constitutional:      Comments: Her weight is down about 5 pounds in the past 6 months but stable over the past month.  She is very talkative and in better spirits today.  Cardiovascular:     Rate and Rhythm: Normal rate and regular rhythm.     Heart sounds: No murmur.  Pulmonary:     Effort: Pulmonary effort is normal.     Breath sounds: Normal breath sounds. No wheezing, rhonchi or rales.     Comments: She has a few crackles and faint wheezes in her bases posteriorly.  Resting O2 sat on room air was 97%.  It dropped to 96% while walking around the clinic. Abdominal:     Palpations: Abdomen is soft.     Tenderness: There is no abdominal tenderness.  Skin:    Findings: No rash.  Neurological:     General: No focal deficit present.  Psychiatric:        Mood and Affect: Mood normal.      Lab Results CMP     Component Value Date/Time   NA 140 11/23/2019 1608   K 4.4 11/23/2019 1608   CL 104 11/23/2019 1608   CO2 28 11/23/2019 1608   GLUCOSE 104 (H) 11/23/2019 1608   BUN 14 11/23/2019 1608   CREATININE 0.94 (H) 11/23/2019 1608   CALCIUM 9.3 11/23/2019 1608   PROT 6.1 11/23/2019 1608   ALBUMIN 3.8 04/23/2015 1057   AST 22 11/23/2019 1608   ALT 18 11/23/2019 1608   ALKPHOS 55 04/23/2015 1057   BILITOT 0.3 11/23/2019 1608   GFRNONAA >60 06/01/2011 1457   GFRAA >60 06/01/2011 1457   Lab Results  Component Value Date   WBC 6.9 11/23/2019   HGB 14.2 11/23/2019   HCT 43.0 11/23/2019   MCV 96.2 11/23/2019   PLT 186 11/23/2019   Sputum AFB culture 05/17/2019; + for Mycobacterium avium again  Chest x-ray 05/17/2019 IMPRESSION: Stable chronic lung disease.  No acute cardiopulmonary abnormality.    By: Genevie Ann M.D.   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  Although her chest CT showed some progression that was in comparison to a scan done over 6 years ago the changes probably do not represent acute worsening of Mycobacterium avium infection.  She will continue her current 4 drug regimen and follow-up in 3 months.          Michel Bickers, MD [facility] [phone] pager   [phone] cell 04/18/2020, 3:53 PM

## 2020-04-24 ENCOUNTER — Other Ambulatory Visit: Payer: Self-pay

## 2020-04-24 ENCOUNTER — Encounter: Payer: Self-pay | Admitting: Critical Care Medicine

## 2020-04-24 ENCOUNTER — Ambulatory Visit: Payer: Medicare HMO | Admitting: Critical Care Medicine

## 2020-04-24 VITALS — BP 118/64 | HR 97 | Temp 98.3°F | Ht 63.5 in | Wt 126.8 lb

## 2020-04-24 DIAGNOSIS — J479 Bronchiectasis, uncomplicated: Secondary | ICD-10-CM

## 2020-04-24 DIAGNOSIS — R0602 Shortness of breath: Secondary | ICD-10-CM

## 2020-04-24 DIAGNOSIS — A310 Pulmonary mycobacterial infection: Secondary | ICD-10-CM | POA: Diagnosis not present

## 2020-04-24 NOTE — Progress Notes (Signed)
Synopsis: Referred in 2010 for bronchiectasis by Nicoletta Dress, MD.  Previously patient of Dr. Normajean Baxter and Dr. Lake Bells.  Subjective:   PATIENT ID: Kiara Medina GENDER: female DOB: 12/26/1945, MRN: [id number]  Chief Complaint  Patient presents with  . Follow-up    cough increased today,     Kiara Medina is a 74 y/o woman with a history of MAI infection and bronchiectasis who presents for follow up. At her last visit she was started on Stiolto for progressively worsening DOE. She has noticed a significant improvement in shortness of breath.  She is able to walk further than she used to be able to.  She has no wheezing, sputum production.  She has noticed that her mouth is more dry, which she manages with drinking water frequently.    OV 03/27/20: Kiara Medina is a 74 year old woman with a history of bronchiectasis and persistent MAI with multiple drug resistance.  She follows with Dr. Megan Salon in infectious disease-recent note from 02/21/2020 reviewed.  She is feeling worse than her last visit.  She is worse dyspnea on exertion, increased coughing and sputum production.  She notices more wheezing, especially at night.  She is short of breath with usual activity, just walking to her mailbox and back.  Even sitting down when she is gardening she notices more shortness of breath.  She has saturations at she is seen as low as 89%, but they are usually 90 to 92% and improved with rest.  She is using her hypertonic saline nebs once daily.  She continues on her current NTM regimen including inhaled amikacin.  The past few months she has lost about 2 pounds. She has less energy than normal.  No fevers or chills.  OV 12/28/19: Kiara Medina is a 74 y/o woman being seen in follow up of chronic bronchiectasis and MAC. She was diagnosed with MAC in 2015, and was originally treated with 3-drug RAE therapy 3 days per week for about a year with negative cultures. She remained off therapy until~2 years ago when she  had a recurrence. She is now on 4-drug daily therapy (ethambutol, rifampin, clofazamine, inhaled liposomal amikacin) with a MDR strain of MAC. She has persistently positive cultures. She follows with Dr. Megan Salon in [place]. She has had worse SOB for about 6 months, but attributes this to being at home more and going out less. She has more trouble walking around without getting SOB. She has more trouble breathing at the temperature extremes. She denies wheezing. She has significant sputum production, ranging from white to yellow, sometimes with steaks of blood. Her appetite is poor and she has continued to slowly lose weight over time. She has not been drinking Ensure due to disliking the taste/ texture, but she has been trying to get protein in her diet. She no longer has night sweats, denies fevers, and she has no issues sleeping. She uses her hypertonic saline nebs once daily before using her inhaled amikacin. She uses her flutter valve PRN, mostly when she feels that she is having trouble clearing her mucus.  She is scheduled to receive her covid vaccine next week [facility]). Former smoker-quit 1987, 25-pack-year history   Past Medical History:  Diagnosis Date  . Allergic rhinitis, cause unspecified   . Bronchiectasis without acute exacerbation ([place])   . Cough   . Sinusitis      Family History  Problem Relation Age of Onset  . Emphysema Sister   . Stroke Mother   .  Clotting disorder Mother      Past Surgical History:  Procedure Laterality Date  . APPENDECTOMY    . CATARACT EXTRACTION  08/2013  . CHOLECYSTECTOMY    . TUBAL LIGATION    . VESICOVAGINAL FISTULA CLOSURE W/ TAH      Social History   Socioeconomic History  . Marital status: Married    Spouse name: Not on file  . Number of children: Not on file  . Years of education: Not on file  . Highest education level: Not on file  Occupational History  . Occupation: realtor  Tobacco Use  . Smoking status: Former Smoker     Packs/day: 1.00    Years: 20.00    Pack years: 20.00    Types: Cigarettes    Quit date: 12/14/1985    Years since quitting: 34.3  . Smokeless tobacco: Never Used  Substance and Sexual Activity  . Alcohol use: Yes    Alcohol/week: 1.0 standard drinks    Types: 1 Glasses of wine per week    Comment: occ  . Drug use: No  . Sexual activity: Not on file  Other Topics Concern  . Not on file  Social History Narrative  . Not on file   Social Determinants of Health   Financial Resource Strain:   . Difficulty of Paying Living Expenses:   Food Insecurity:   . Worried About Charity fundraiser in the Last Year:   . Arboriculturist in the Last Year:   Transportation Needs:   . Film/video editor (Medical):   Marland Kitchen Lack of Transportation (Non-Medical):   Physical Activity:   . Days of Exercise per Week:   . Minutes of Exercise per Session:   Stress:   . Feeling of Stress :   Social Connections:   . Frequency of Communication with Friends and Family:   . Frequency of Social Gatherings with Friends and Family:   . Attends Religious Services:   . Active Member of Clubs or Organizations:   . Attends Archivist Meetings:   Marland Kitchen Marital Status:   Intimate Partner Violence:   . Fear of Current or Ex-Partner:   . Emotionally Abused:   Marland Kitchen Physically Abused:   . Sexually Abused:      No Known Allergies   Immunization History  Administered Date(s) Administered  . Influenza Split 09/13/2012, 09/06/2014  . Influenza Whole 09/09/2009, 08/14/2010, 09/14/2011  . Influenza, High Dose Seasonal PF 10/10/2015  . Influenza,inj,Quad PF,6+ Mos 09/13/2013, 09/21/2016, 08/26/2017, 08/22/2018  . Influenza-Unspecified 08/29/2019  . Moderna SARS-COVID-2 Vaccination 01/03/2020, 01/31/2020  . Pneumococcal Conjugate-13 11/24/2014  . Pneumococcal Polysaccharide-23 07/17/2009    Outpatient Medications Prior to Visit  Medication Sig Dispense Refill  . acyclovir (ZOVIRAX) 400 MG tablet Take 400 mg  by mouth 2 (two) times daily.    Marland Kitchen albuterol (PROVENTIL HFA;VENTOLIN HFA) 108 (90 Base) MCG/ACT inhaler Inhale 2 puffs into the lungs every 6 (six) hours as needed for wheezing or shortness of breath. 1 Inhaler 0  . ARIKAYCE 590 MG/8.4ML SUSP Inhale the contents of one vial (583m) via Lamira device 1 time daily as directed. 8.4 mL 2  . ascorbic acid (VITAMIN C) 500 MG tablet Take 500 mg by mouth daily.    .Marland Kitchenatorvastatin (LIPITOR) 20 MG tablet Take 20 mg by mouth daily.    . B Complex Vitamins (B COMPLEX-B12 PO) Take 1 tablet by mouth daily.    . benzonatate (TESSALON PERLES) 100 MG capsule Take 1  capsule (100 mg total) by mouth 3 (three) times daily as needed for cough. 90 capsule 5  . calcium carbonate (OSCAL) 1500 (600 Ca) MG TABS tablet Take 600 mg of elemental calcium by mouth 2 (two) times daily with a meal.    . cetirizine (ZYRTEC) 10 MG tablet every morning.    . Cholecalciferol (VITAMIN D3) 125 MCG (5000 UT) TABS Take 1 tablet by mouth.    . CLOFAZIMINE PO Take 2 tablets by mouth daily.    Marland Kitchen ethambutol (MYAMBUTOL) 400 MG tablet TAKE 3 TABLETS (1,200 MG TOTAL) BY MOUTH DAILY. 270 tablet 1  . Flaxseed, Linseed, (FLAX SEED OIL) 1300 MG CAPS Take by mouth daily.    . Glucosamine-Chondroitin 1500-1200 MG/30ML LIQD Take 1 tablet by mouth daily.    . hydrocortisone (PROCTOZONE-HC) 2.5 % rectal cream Proctozone-HC 2.5 % topical cream perineal applicator    . hydrocortisone 2.5 % cream hydrocortisone 2.5 % topical cream  APPLY A THIN LAYER TO THE  AFFECTED AREA THREE TIMES DAILY AS NEEDED    . Melatonin 10 MG CAPS 10 mg nightly as needed.    Marland Kitchen omeprazole (PRILOSEC) 40 MG capsule Take 1 capsule (40 mg total) by mouth 2 (two) times daily. 180 capsule 1  . Probiotic Product (PROBIOTIC DAILY PO) Take 1 capsule by mouth daily.    Marland Kitchen Respiratory Therapy Supplies (FLUTTER) DEVI Use as directed. 1 each 0  . rifampin (RIFADIN) 300 MG capsule TAKE 2 CAPSULES EVERY DAY 180 capsule 1  . sodium chloride  HYPERTONIC 3 % nebulizer solution INHALE THE CONTENTS OF 1 VIAL ( 4ML ) VIA NEBULIZER TWICE DAILY 720 mL 3  . Spacer/Aero-Holding Chambers (AEROCHAMBER MV) inhaler Use as instructed 1 each 2  . Tiotropium Bromide-Olodaterol (STIOLTO RESPIMAT) 2.5-2.5 MCG/ACT AERS Inhale 2 puffs into the lungs daily. 12 g 3  . Tiotropium Bromide-Olodaterol (STIOLTO RESPIMAT) 2.5-2.5 MCG/ACT AERS Inhale 2 puffs into the lungs daily. 4 g 0  . TURMERIC PO Take 2,000 mg by mouth daily.      No facility-administered medications prior to visit.    Review of Systems  Constitutional: Positive for weight loss. Negative for fever.  Respiratory: Positive for cough, hemoptysis, sputum production and shortness of breath. Negative for wheezing.   Cardiovascular: Negative for chest pain.  Gastrointestinal: Negative for nausea and vomiting.     Objective:   Vitals:   04/24/20 1635  BP: 118/64  Pulse: 97  Temp: 98.3 F (36.8 C)  TempSrc: Temporal  SpO2: 97%  Weight: 126 lb 12.8 oz (57.5 kg)  Height: 5' 3.5" (1.613 m)   97% on   RA BMI Readings from Last 3 Encounters:  04/24/20 22.11 kg/m  04/18/20 22.32 kg/m  03/27/20 22.36 kg/m   Wt Readings from Last 3 Encounters:  04/24/20 126 lb 12.8 oz (57.5 kg)  04/18/20 126 lb (57.2 kg)  03/27/20 126 lb 3.2 oz (57.2 kg)    Physical Exam Vitals reviewed.  Constitutional:      Appearance: Normal appearance. She is not ill-appearing.  HENT:     Head: Normocephalic and atraumatic.  Eyes:     General: No scleral icterus. Cardiovascular:     Rate and Rhythm: Normal rate and regular rhythm.     Heart sounds: No murmur.  Pulmonary:     Comments: Breathing comfortably on room air, no conversational dyspnea.  No witnessed coughing.  Clear to auscultation bilaterally-significant improvement since last visit. Abdominal:     General: There is no distension.  Musculoskeletal:  Cervical back: Neck supple.  Lymphadenopathy:     Cervical: No cervical adenopathy.   Neurological:     Mental Status: She is alert.      CBC    Component Value Date/Time   WBC 6.9 11/23/2019 1608   RBC 4.47 11/23/2019 1608   HGB 14.2 11/23/2019 1608   HCT 43.0 11/23/2019 1608   PLT 186 11/23/2019 1608   MCV 96.2 11/23/2019 1608   MCH 31.8 11/23/2019 1608   MCHC 33.0 11/23/2019 1608   RDW 12.7 11/23/2019 1608   LYMPHSABS 1,929 01/10/2019 1657   EOSABS 289 01/10/2019 1657   BASOSABS 18 01/10/2019 1657    CHEMISTRY No results for input(s): NA, K, CL, CO2, GLUCOSE, BUN, CREATININE, CALCIUM, MG, PHOS in the last 168 hours. CrCl cannot be calculated (Patient's most recent lab result is older than the maximum 21 days allowed.).  12/28/2019: IgG 954 IgM 96 IgA 289 IgE 10 IgG subclasses normal (other than increased IgG subclass 3)  Cultures: 11/23/2019-MAI 05/17/2019-MAI 03/15/2019-MAI 01/10/2019 MAI 11/07/2018-MAI 10/10/2018-MAI  08/22/2018-MAI 07/11/2018-MAI 05/31/2018-MAI MAI susceptibilities-resistant clarithromycin, linezolid.  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  Susceptible to amikacin, cefepime, clarithromycin, imipenem, linezolid, trimethoprim sulfamethoxazole. 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  Compared to 2015 CXR, more disease in the lower lobes.  CT chest high-resolution 01/29/2014- nodules throughout, a few starting to cavitate.  Most disease in bilateral upper lobes, RML, lingula.  Lower lobes relatively preserved.  HRCT chest 04/10/2020-progressive bronchiectasis, new right upper lobe cavity, significantly more nodules  of varying sizes.  Tree-in-bud opacities diffusely.  Mucus impaction in airways.   Pulmonary Functions Testing Results: PFT Results Latest Ref Rng & Units 04/18/2018  FVC-Pre L 2.08  FVC-Predicted Pre % 79  Pre FEV1/FVC % % 75  FEV1-Pre L 1.57  FEV1-Predicted Pre % 80   2019-no significant obstruction, mildly reduced FVC.     Assessment & Plan:     ICD-10-CM   1. Shortness of breath  R06.02   2. MAI (mycobacterium avium-intracellulare) (HCC)  A31.0   3. Bronchiectasis without complication ([place])  O24.2     Dyspnea on exertion likely due to chronic bronchiectasis-improved on LAMA/LABA -Continue Stiolto once daily.  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  No obvious immunodeficiencies.  Progressive on CT over the last 6 years.  No evidence of hypersensitivity pneumonitis on CT scan complicating her treatment. -Continue hypertonic saline nebs with flutter valve twice daily. -Continue current 4 drug MAI therapy and ID follow-up.  I agree with Dr. Megan Salon that she has limited treatment options. -Reviewed her CT scan during her visit --Continue regular physical activity and maintaining nutritional status  -Avoid ICS and steroids -Up-to-date on Covid, pneumonia, flu vaccines.  RTC in 3 months.     Current Outpatient Medications:  .  acyclovir (ZOVIRAX) 400 MG tablet, Take 400 mg by mouth 2 (two) times daily., Disp: , Rfl:  .  albuterol (PROVENTIL HFA;VENTOLIN HFA) 108 (90 Base) MCG/ACT inhaler, Inhale 2 puffs into the lungs every 6 (six) hours as needed for wheezing or shortness of breath., Disp: 1 Inhaler, Rfl: 0 .  ARIKAYCE 590 MG/8.4ML SUSP, Inhale the contents of  one vial (519m) via Lamira device 1 time daily as directed., Disp: 8.4 mL, Rfl: 2 .  ascorbic acid (VITAMIN C) 500 MG tablet, Take 500 mg by mouth daily., Disp: , Rfl:  .  atorvastatin (LIPITOR) 20 MG  tablet, Take 20 mg by mouth daily., Disp: , Rfl:  .  B Complex Vitamins (B COMPLEX-B12 PO), Take 1 tablet by mouth daily., Disp: , Rfl:  .  benzonatate (TESSALON PERLES) 100 MG capsule, Take 1 capsule (100 mg total) by mouth 3 (three) times daily as needed for cough., Disp: 90 capsule, Rfl: 5 .  calcium carbonate (OSCAL) 1500 (600 Ca) MG TABS tablet, Take 600 mg of elemental calcium by mouth 2 (two) times daily with a meal., Disp: , Rfl:  .  cetirizine (ZYRTEC) 10 MG tablet, every morning., Disp: , Rfl:  .  Cholecalciferol (VITAMIN D3) 125 MCG (5000 UT) TABS, Take 1 tablet by mouth., Disp: , Rfl:  .  CLOFAZIMINE PO, Take 2 tablets by mouth daily., Disp: , Rfl:  .  ethambutol (MYAMBUTOL) 400 MG tablet, TAKE 3 TABLETS (1,200 MG TOTAL) BY MOUTH DAILY., Disp: 270 tablet, Rfl: 1 .  Flaxseed, Linseed, (FLAX SEED OIL) 1300 MG CAPS, Take by mouth daily., Disp: , Rfl:  .  Glucosamine-Chondroitin 1500-1200 MG/30ML LIQD, Take 1 tablet by mouth daily., Disp: , Rfl:  .  hydrocortisone (PROCTOZONE-HC) 2.5 % rectal cream, Proctozone-HC 2.5 % topical cream perineal applicator, Disp: , Rfl:  .  hydrocortisone 2.5 % cream, hydrocortisone 2.5 % topical cream  APPLY A THIN LAYER TO THE  AFFECTED AREA THREE TIMES DAILY AS NEEDED, Disp: , Rfl:  .  Melatonin 10 MG CAPS, 10 mg nightly as needed., Disp: , Rfl:  .  omeprazole (PRILOSEC) 40 MG capsule, Take 1 capsule (40 mg total) by mouth 2 (two) times daily., Disp: 180 capsule, Rfl: 1 .  Probiotic Product (PROBIOTIC DAILY PO), Take 1 capsule by mouth daily., Disp: , Rfl:  .  Respiratory Therapy Supplies (FLUTTER) DEVI, Use as directed., Disp: 1 each, Rfl: 0 .  rifampin (RIFADIN) 300 MG capsule, TAKE 2 CAPSULES EVERY DAY, Disp: 180 capsule, Rfl: 1 .  sodium chloride HYPERTONIC 3 % nebulizer solution, INHALE THE CONTENTS OF 1 VIAL ( 4ML ) VIA NEBULIZER TWICE DAILY, Disp: 720 mL, Rfl: 3 .  Spacer/Aero-Holding Chambers (AEROCHAMBER MV) inhaler, Use as instructed, Disp: 1 each,  Rfl: 2 .  Tiotropium Bromide-Olodaterol (STIOLTO RESPIMAT) 2.5-2.5 MCG/ACT AERS, Inhale 2 puffs into the lungs daily., Disp: 12 g, Rfl: 3 .  Tiotropium Bromide-Olodaterol (STIOLTO RESPIMAT) 2.5-2.5 MCG/ACT AERS, Inhale 2 puffs into the lungs daily., Disp: 4 g, Rfl: 0 .  TURMERIC PO, Take 2,000 mg by mouth daily. , Disp: , Rfl:      LJulian Hy DO [facility] 04/24/2020 4:37 PM

## 2020-04-24 NOTE — Patient Instructions (Addendum)
Thank you for visiting Dr. Carlis Abbott at [facility]. We recommend the following:  Keep using your Stiolto 2 puffs once daily. Take albuterol about 5 minutes before if coughing.   Return in about 3 months (around 07/25/2020).    Please do your part to reduce the spread of COVID-19.

## 2020-04-26 DIAGNOSIS — H59099 Other disorders of unspecified eye following cataract surgery: Secondary | ICD-10-CM | POA: Diagnosis not present

## 2020-04-26 DIAGNOSIS — Z9841 Cataract extraction status, right eye: Secondary | ICD-10-CM | POA: Diagnosis not present

## 2020-04-26 DIAGNOSIS — Z961 Presence of intraocular lens: Secondary | ICD-10-CM | POA: Diagnosis not present

## 2020-04-26 DIAGNOSIS — Z9842 Cataract extraction status, left eye: Secondary | ICD-10-CM | POA: Diagnosis not present

## 2020-04-30 ENCOUNTER — Other Ambulatory Visit: Payer: Self-pay | Admitting: Internal Medicine

## 2020-04-30 ENCOUNTER — Other Ambulatory Visit: Payer: Self-pay

## 2020-04-30 DIAGNOSIS — A310 Pulmonary mycobacterial infection: Secondary | ICD-10-CM

## 2020-04-30 MED ORDER — ARIKAYCE 590 MG/8.4ML IN SUSP: RESPIRATORY_TRACT | 2 refills | Status: DC

## 2020-04-30 MED ORDER — ETHAMBUTOL HCL 400 MG PO TABS: 1200.0000 mg | ORAL_TABLET | Freq: Every day | ORAL | 1 refills | Status: DC

## 2020-04-30 MED ORDER — RIFAMPIN 300 MG PO CAPS: 600.0000 mg | ORAL_CAPSULE | Freq: Every day | ORAL | 1 refills | Status: DC

## 2020-05-03 ENCOUNTER — Other Ambulatory Visit: Payer: Self-pay | Admitting: Primary Care

## 2020-05-03 ENCOUNTER — Other Ambulatory Visit: Payer: Self-pay | Admitting: *Deleted

## 2020-05-03 MED ORDER — OMEPRAZOLE 20 MG PO CPDR
20.0000 mg | DELAYED_RELEASE_CAPSULE | Freq: Every day | ORAL | 1 refills | Status: DC
Start: 2020-05-03 — End: 2020-05-08

## 2020-05-08 ENCOUNTER — Telehealth: Payer: Self-pay | Admitting: Critical Care Medicine

## 2020-05-08 MED ORDER — OMEPRAZOLE 40 MG PO CPDR: 40.0000 mg | DELAYED_RELEASE_CAPSULE | Freq: Two times a day (BID) | ORAL | 1 refills | Status: DC

## 2020-05-08 NOTE — Telephone Encounter (Signed)
Called and spoke with pt letting her know that she should be taking omeprazole 40mg  bid not 20mg  which was prescribed. Pt verbalized understanding. Stated to pt that I would call [employer] to have this fixed so they would not send the 20mg  omeprazole the next time she needed a refill and she verbalized understanding.  [facility] and spoke with Candace about the Rx that was inadvertently sent to them with the wrong dose and wrong instructions. Candace stated to me that we could send a new Rx to [facility] with the proper dosage and instructions and it would override the Rx that pt already received. Rx sent to [facility] for pt. Called and spoke with pt letting her know this info and she verbalized understanding. Nothing further needed.

## 2020-05-08 NOTE — Telephone Encounter (Signed)
Called and spoke with pt who wanted clarification on the omeprazole dose and instructions. Pt stated she just received Rx from [facility] in the mail and it was for 20mg  Omeprazole for her to take one daily. Per last OV med list, pt was on 40mg  Omeprazole with instructions to take one tablet twice daily of the 40mg  Omeprazole. She stated that she was not aware that the instructions or the dosage had changed.  Dr. Carlis Abbott, please advise what dose of the Omeprazole pt should be on and also how often she should take it as there are two different doses on pt's med list with one stating to take it twice daily and one stating to take it once daily.

## 2020-05-08 NOTE — Telephone Encounter (Signed)
Looks like it was inadvertently changed to 20mg  BID. She should be on 40mg  BID per my early May note.    Julian Hy, DO 05/08/20 2:52 PM [facility]

## 2020-05-10 ENCOUNTER — Other Ambulatory Visit: Payer: Self-pay | Admitting: Critical Care Medicine

## 2020-05-10 MED ORDER — OMEPRAZOLE 40 MG PO CPDR: 40.0000 mg | DELAYED_RELEASE_CAPSULE | Freq: Two times a day (BID) | ORAL | 1 refills | Status: DC

## 2020-05-20 DIAGNOSIS — Z01419 Encounter for gynecological examination (general) (routine) without abnormal findings: Secondary | ICD-10-CM | POA: Diagnosis not present

## 2020-05-20 DIAGNOSIS — E559 Vitamin D deficiency, unspecified: Secondary | ICD-10-CM | POA: Diagnosis not present

## 2020-05-20 DIAGNOSIS — R7301 Impaired fasting glucose: Secondary | ICD-10-CM | POA: Diagnosis not present

## 2020-05-20 DIAGNOSIS — E785 Hyperlipidemia, unspecified: Secondary | ICD-10-CM | POA: Diagnosis not present

## 2020-05-20 DIAGNOSIS — A310 Pulmonary mycobacterial infection: Secondary | ICD-10-CM | POA: Diagnosis not present

## 2020-05-20 DIAGNOSIS — K219 Gastro-esophageal reflux disease without esophagitis: Secondary | ICD-10-CM | POA: Diagnosis not present

## 2020-05-20 DIAGNOSIS — Z1231 Encounter for screening mammogram for malignant neoplasm of breast: Secondary | ICD-10-CM | POA: Diagnosis not present

## 2020-05-20 DIAGNOSIS — J479 Bronchiectasis, uncomplicated: Secondary | ICD-10-CM | POA: Diagnosis not present

## 2020-06-27 ENCOUNTER — Ambulatory Visit: Payer: Medicare HMO | Admitting: Internal Medicine

## 2020-06-27 ENCOUNTER — Other Ambulatory Visit: Payer: Self-pay

## 2020-06-27 ENCOUNTER — Encounter: Payer: Self-pay | Admitting: Internal Medicine

## 2020-06-27 DIAGNOSIS — A310 Pulmonary mycobacterial infection: Secondary | ICD-10-CM

## 2020-06-27 DIAGNOSIS — F329 Major depressive disorder, single episode, unspecified: Secondary | ICD-10-CM

## 2020-06-27 DIAGNOSIS — F32A Depression, unspecified: Secondary | ICD-10-CM | POA: Insufficient documentation

## 2020-06-27 DIAGNOSIS — R634 Abnormal weight loss: Secondary | ICD-10-CM | POA: Diagnosis not present

## 2020-06-27 NOTE — Progress Notes (Signed)
[facility]  Patient Active Problem List   Diagnosis Date Noted  . 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 BRONCHIECTASIS 05/23/2009    Priority: Medium  . Depression 06/27/2020  . Unintentional weight loss 05/17/2019  . Dysphonia 01/11/2018  . Right shoulder pain 01/21/2017  . Encounter for hepatitis C screening test for low risk patient 01/21/2017  . GERD (gastroesophageal reflux disease) 02/21/2016  . 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 Dyslipidemia 11/14/2014  . Allergic rhinitis 05/03/2009    Patient's Medications  New Prescriptions   No medications on file  Previous Medications   ACYCLOVIR (ZOVIRAX) 400 MG TABLET    Take 400 mg by mouth 2 (two) times daily.   ALBUTEROL (PROVENTIL HFA;VENTOLIN HFA) 108 (90 BASE) MCG/ACT INHALER    Inhale 2 puffs into the lungs every 6 (six) hours as needed for wheezing or shortness of breath.   AMIKACIN SULFATE LIPOSOME (ARIKAYCE) 590 MG/8.4ML SUSP    Inhale the contents of one vial (580m) via Lamira device 1 time daily as directed.   ASCORBIC ACID (VITAMIN C) 500 MG TABLET    Take 500 mg by mouth daily.   ATORVASTATIN (LIPITOR) 20 MG TABLET    Take 20 mg by mouth daily.   B COMPLEX VITAMINS (B COMPLEX-B12 PO)    Take 1 tablet by mouth daily.   BENZONATATE (TESSALON PERLES) 100 MG CAPSULE    Take 1 capsule (100 mg total) by mouth 3 (three) times daily as needed for cough.   CALCIUM CARBONATE (OSCAL) 1500 (600 CA) MG TABS TABLET    Take 600 mg of elemental calcium by mouth 2 (two) times daily with a meal.   CETIRIZINE (ZYRTEC) 10 MG TABLET    every morning.   CHOLECALCIFEROL (VITAMIN D3) 125 MCG (5000 UT) TABS    Take 1 tablet by mouth.   CLOFAZIMINE PO    Take 2 tablets by mouth daily.   ETHAMBUTOL (MYAMBUTOL) 400 MG TABLET    Take 3 tablets (1,200 mg total) by mouth daily.   FLAXSEED, LINSEED, (FLAX SEED OIL) 1300 MG CAPS    Take by mouth daily.    GLUCOSAMINE-CHONDROITIN 1500-1200 MG/30ML LIQD    Take 1 tablet by mouth daily.   HYDROCORTISONE (PROCTOZONE-HC) 2.5 % RECTAL CREAM    Proctozone-HC 2.5 % topical cream perineal applicator   HYDROCORTISONE 2.5 % CREAM    hydrocortisone 2.5 % topical cream  APPLY A THIN LAYER TO THE  AFFECTED AREA THREE TIMES DAILY AS NEEDED   MELATONIN 10 MG CAPS    10 mg nightly as needed.   OMEPRAZOLE (PRILOSEC) 40 MG CAPSULE    Take 1 capsule (40 mg total) by mouth 2 (two) times daily.   PROBIOTIC PRODUCT (PROBIOTIC DAILY PO)    Take 1 capsule by mouth daily.   RESPIRATORY THERAPY SUPPLIES (FLUTTER) DEVI    Use as directed.   RIFAMPIN (RIFADIN) 300 MG CAPSULE    Take 2 capsules (600 mg total) by mouth daily.   SODIUM CHLORIDE HYPERTONIC 3 % NEBULIZER SOLUTION    INHALE THE CONTENTS OF 1 VIAL ( 4ML ) VIA NEBULIZER TWICE DAILY   SPACER/AERO-HOLDING CHAMBERS (AEROCHAMBER MV) INHALER    Use as instructed   TIOTROPIUM BROMIDE-OLODATEROL (STIOLTO RESPIMAT) 2.5-2.5 MCG/ACT AERS    Inhale 2 puffs into the lungs daily.   TIOTROPIUM BROMIDE-OLODATEROL (STIOLTO RESPIMAT) 2.5-2.5 MCG/ACT AERS    Inhale 2 puffs into the lungs  daily.   TURMERIC PO    Take 2,000 mg by mouth daily.   Modified Medications   No medications on file  Discontinued Medications   No medications on file    Subjective: Kiara Medina is in for her routine follow-up visit.  She is now completed 28 months of therapy for relapsed, antibiotic resistant Mycobacterium avium pneumonia.  She has been receiving oral ethambutol, rifampin and clofazimine along with aerosolized amikacin.  She saw her pulmonologist, Dr. Eusebio Me, recently who started out on Stiolto.  She says that she almost immediately started to feel better.  She is not having these severe coughing spells anymore.  She is having less shortness of breath.  She is bringing up a little more white to yellow sputum.  She says she is not having any significant problems tolerating her 3 oral antibiotics for  aerosolized amikacin. She recently started having more trouble administering her Stiolto. She says that her throat closes like a "trapdoor" and she feels like she may not be getting the full dose. She has felt like she had more dyspnea on exertion recently. She has been coughing up more phlegm, occasionally with some specks of blood. She has been worried about darkening of her skin and recent weight loss. She says that she notes over the last year that she gets full very easily and does not eat as much. Her husband has some progressive dementia. He sleeps most of the day so she is "on her own." She is looking forward to a 5-day trip to the [place] in August to visit her grandchildren.  She had a chest CT scan on 04/10/2020 which showed:  IMPRESSION: 1. Progressive peribronchovascular nodularity/nodular consolidation with associated bronchiectasis, findings likely due to worsening mycobacterium avium complex. Findings are suggestive of an alternative diagnosis (not UIP) per consensus guidelines: Diagnosis of Idiopathic Pulmonary Fibrosis: An Official ATS/ERS/JRS/ALAT Clinical Practice Guideline. [place], Iss 5, [id number], Aug 14 2017. 2. Cavitary nodule in the right upper lobe is likely related to MAC. Difficult to definitively exclude malignancy. Consider follow-up CT chest without contrast in 3 months, as clinically indicated. 3. Basilar predominant ill-defined centrilobular nodularity can be seen with an infectious bronchiolitis or possibly subacute hypersensitivity pneumonitis. 4. Aortic atherosclerosis (ICD10-I70.0). Coronary artery calcification.  Review of Systems: Review of Systems  Constitutional: Positive for malaise/fatigue and weight loss. Negative for chills, diaphoresis and fever.  HENT: Positive for hearing loss and tinnitus.        She has had no change in her hearing loss or tinnitus since starting on amikacin and her audiograms have not revealed any acute  changes.  Respiratory: Positive for cough, hemoptysis, sputum production and shortness of breath. Negative for wheezing.   Cardiovascular: Negative for chest pain.  Gastrointestinal: Positive for heartburn. Negative for abdominal pain, diarrhea, nausea and vomiting.       She has frequent soft stools, especially in the morning after taking her Mycobacterium avium medicines.  This is unchanged.  Musculoskeletal: Positive for joint pain.  Psychiatric/Behavioral: Positive for depression.    Past Medical History:  Diagnosis Date  . Allergic rhinitis, cause unspecified   . Bronchiectasis without acute exacerbation ([place])   . Cough   . Sinusitis     Social History   Tobacco Use  . Smoking status: Former Smoker    Packs/day: 1.00    Years: 20.00    Pack years: 20.00    Types: Cigarettes    Quit date: 12/14/1985  Years since quitting: 34.5  . Smokeless tobacco: Never Used  Vaping Use  . Vaping Use: Never used  Substance Use Topics  . Alcohol use: Yes    Alcohol/week: 1.0 standard drink    Types: 1 Glasses of wine per week    Comment: occ  . Drug use: No    Family History  Problem Relation Age of Onset  . Emphysema Sister   . Stroke Mother   . Clotting disorder Mother     No Known Allergies  Objective: Vitals:   06/27/20 1546  Pulse: 88  SpO2: 97%  Weight: 124 lb 3.2 oz (56.3 kg)   Body mass index is 21.66 kg/m.  Physical Exam Constitutional:      Comments: Her weight is down about 9 pounds in the past 7 months.    Eyes:     Conjunctiva/sclera: Conjunctivae normal.  Cardiovascular:     Rate and Rhythm: Normal rate and regular rhythm.     Heart sounds: No murmur heard.   Pulmonary:     Effort: Pulmonary effort is normal.     Breath sounds: Normal breath sounds. No wheezing, rhonchi or rales.     Comments: She has a few crackles in her bases posteriorly.   Abdominal:     Palpations: Abdomen is soft.     Tenderness: There is no abdominal tenderness.   Skin:    Findings: No rash.     Comments: I do not notice any darkening of her skin.  Neurological:     General: No focal deficit present.  Psychiatric:        Mood and Affect: Mood normal.     Lab Results CMP     Component Value Date/Time   NA 140 11/23/2019 1608   K 4.4 11/23/2019 1608   CL 104 11/23/2019 1608   CO2 28 11/23/2019 1608   GLUCOSE 104 (H) 11/23/2019 1608   BUN 14 11/23/2019 1608   CREATININE 0.94 (H) 11/23/2019 1608   CALCIUM 9.3 11/23/2019 1608   PROT 6.1 11/23/2019 1608   ALBUMIN 3.8 04/23/2015 1057   AST 22 11/23/2019 1608   ALT 18 11/23/2019 1608   ALKPHOS 55 04/23/2015 1057   BILITOT 0.3 11/23/2019 1608   GFRNONAA >60 06/01/2011 1457   GFRAA >60 06/01/2011 1457   Lab Results  Component Value Date   WBC 6.9 11/23/2019   HGB 14.2 11/23/2019   HCT 43.0 11/23/2019   MCV 96.2 11/23/2019   PLT 186 11/23/2019   Sputum AFB culture 05/17/2019; + for Mycobacterium avium again  Chest x-ray 05/17/2019 IMPRESSION: Stable chronic lung disease.  No acute cardiopulmonary abnormality.    By: Genevie Ann M.D.   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  I suspect that her progression would be much worse if she was not on treatment.  Unfortunately there are not many safe or effective alternative agents to use at this point.  She is comfortable continuing her current regimen.  I will recheck lab work today.  She will return in 2 months.      Relevant Orders   CBC   Comprehensive metabolic panel     Unprioritized   Unintentional weight loss    She continues to lose weight.  With her early satiety I asked her  to focus on not only her meals but frequent smaller snacks every hour.      Depression    She does admit to some recent mild depression related to her  husband's dementia and her own social isolation during the pandemic.  This could be contributing to her weight loss.  She is very much looking forward to her trip to the [place] next month.  I told her to not bother to take her aerosolized amikacin.  This will make her trip much simpler and more pleasant.          Michel Bickers, MD [facility] [phone] pager   [phone] cell 06/27/2020, 4:24 PM

## 2020-06-27 NOTE — Assessment & Plan Note (Signed)
She has multidrug-resistant Mycobacterium avium that is progressing slowly despite being on her current 4 drug regimen.  I suspect that her progression would be much worse if she was not on treatment.  Unfortunately there are not many safe or effective alternative agents to use at this point.  She is comfortable continuing her current regimen.  I will recheck lab work today.  She will return in 2 months.

## 2020-06-27 NOTE — Assessment & Plan Note (Signed)
She continues to lose weight.  With her early satiety I asked her to focus on not only her meals but frequent smaller snacks every hour.

## 2020-06-27 NOTE — Assessment & Plan Note (Signed)
She does admit to some recent mild depression related to her husband's dementia and her own social isolation during the pandemic.  This could be contributing to her weight loss.  She is very much looking forward to her trip to the [place] next month.  I told her to not bother to take her aerosolized amikacin.  This will make her trip much simpler and more pleasant.

## 2020-06-28 LAB — CBC
HCT: 40.2 % (ref 35.0–45.0)
Hemoglobin: 13.3 g/dL (ref 11.7–15.5)
MCH: 31.9 pg (ref 27.0–33.0)
MCHC: 33.1 g/dL (ref 32.0–36.0)
MCV: 96.4 fL (ref 80.0–100.0)
MPV: 11.1 fL (ref 7.5–12.5)
Platelets: 169 10*3/uL (ref 140–400)
RBC: 4.17 10*6/uL (ref 3.80–5.10)
RDW: 13.2 % (ref 11.0–15.0)
WBC: 6.1 10*3/uL (ref 3.8–10.8)

## 2020-06-28 LAB — COMPREHENSIVE METABOLIC PANEL
AG Ratio: 1.6 (calc) (ref 1.0–2.5)
ALT: 19 U/L (ref 6–29)
AST: 24 U/L (ref 10–35)
Albumin: 3.6 g/dL (ref 3.6–5.1)
Alkaline phosphatase (APISO): 72 U/L (ref 37–153)
BUN: 13 mg/dL (ref 7–25)
CO2: 27 mmol/L (ref 20–32)
Calcium: 9 mg/dL (ref 8.6–10.4)
Chloride: 104 mmol/L (ref 98–110)
Creat: 0.91 mg/dL (ref 0.60–0.93)
Globulin: 2.3 g/dL (calc) (ref 1.9–3.7)
Glucose, Bld: 87 mg/dL (ref 65–99)
Potassium: 4.2 mmol/L (ref 3.5–5.3)
Sodium: 139 mmol/L (ref 135–146)
Total Bilirubin: 0.4 mg/dL (ref 0.2–1.2)
Total Protein: 5.9 g/dL — ABNORMAL LOW (ref 6.1–8.1)

## 2020-07-02 ENCOUNTER — Telehealth: Payer: Self-pay

## 2020-07-02 NOTE — Telephone Encounter (Signed)
Please let her know that her labs were all okay.

## 2020-07-02 NOTE — Telephone Encounter (Signed)
Patient called to follow up on labs that were drawn last week at follow up appointment. Wanted to be sure that there was nothing out of the ordinary. Informed patient that provider has not reached out to triage team to relay any messages but would follow up to confirm.  Kiara Kon Lorita Officer, RN

## 2020-07-02 NOTE — Telephone Encounter (Signed)
Per Dr. Megan Salon, all labs appeared to be normal. Informed patient who verbalized understanding.   Daion Ginsberg Lorita Officer, RN

## 2020-07-29 ENCOUNTER — Ambulatory Visit: Payer: Medicare HMO | Admitting: Internal Medicine

## 2020-07-30 ENCOUNTER — Telehealth: Payer: Self-pay

## 2020-07-30 NOTE — Telephone Encounter (Signed)
Patient called office today to ask MD if she would need covid 19 booster. Per MD patient will need booster based on diagnosis. Advised to contact local pharmacy or health department. [place]

## 2020-08-05 ENCOUNTER — Ambulatory Visit: Payer: Medicare HMO | Admitting: Internal Medicine

## 2020-08-05 ENCOUNTER — Telehealth: Payer: Self-pay | Admitting: Critical Care Medicine

## 2020-08-05 DIAGNOSIS — M810 Age-related osteoporosis without current pathological fracture: Secondary | ICD-10-CM | POA: Diagnosis not present

## 2020-08-05 NOTE — Telephone Encounter (Signed)
Increased shortness of breath and coughing over last 2 weeks., stiolto worked well but then after 6 weeks started to close up throat when taking doesn't feel like she is getting relief, wants to know if there is another inhaler for patient to use.    Dr. Carlis Abbott please advise, patient not in distress just wanted to let you know before appointment.

## 2020-08-05 NOTE — Telephone Encounter (Signed)
Anoro or Bevespi are similar options she should try. Is one covered by her insurance?  Julian Hy, DO 08/05/20 7:12 PM [facility]

## 2020-08-06 MED ORDER — BEVESPI AEROSPHERE 9-4.8 MCG/ACT IN AERO: 2.0000 | INHALATION_SPRAY | Freq: Two times a day (BID) | RESPIRATORY_TRACT | 4 refills | Status: DC

## 2020-08-06 NOTE — Telephone Encounter (Signed)
Yes please, thanks!  Julian Hy, DO 08/06/20 1:22 PM [facility]

## 2020-08-06 NOTE — Telephone Encounter (Signed)
Looks like Kiara Medina will probably be covered by her insurance. Would you like for me to let patient know and order if she wants to proceed?

## 2020-08-06 NOTE — Telephone Encounter (Signed)
Called and spoke with patient letting her know that we are going to send in [place] inhaler to her preferred pharmacy. She expressed understanding. RX has been sent. Nothing further needed at this time. Will route to Dr. Carlis Abbott as Juluis [place]

## 2020-08-13 ENCOUNTER — Other Ambulatory Visit: Payer: Self-pay

## 2020-08-13 ENCOUNTER — Ambulatory Visit: Payer: Medicare HMO | Admitting: Critical Care Medicine

## 2020-08-13 ENCOUNTER — Encounter: Payer: Self-pay | Admitting: Critical Care Medicine

## 2020-08-13 VITALS — BP 112/60 | HR 98 | Temp 97.8°F | Ht 63.0 in | Wt 124.0 lb

## 2020-08-13 DIAGNOSIS — A310 Pulmonary mycobacterial infection: Secondary | ICD-10-CM | POA: Diagnosis not present

## 2020-08-13 DIAGNOSIS — R634 Abnormal weight loss: Secondary | ICD-10-CM | POA: Diagnosis not present

## 2020-08-13 DIAGNOSIS — J479 Bronchiectasis, uncomplicated: Secondary | ICD-10-CM | POA: Diagnosis not present

## 2020-08-13 NOTE — Progress Notes (Signed)
Synopsis: Referred in 2010 for bronchiectasis by Kiara Dress, MD.  Previously patient of Dr. Normajean Medina and Dr. Lake Medina.  Subjective:   PATIENT ID: Kiara Medina GENDER: female DOB: [birth year], MRN: [id number]  Chief Complaint  Patient presents with  . Follow-up    Patient states that she was doing good Stiolto but it feels like it is closing her throat. RX sent in for [facility] but she has not picked up yet. States she has no energy, no appitete and shortness of breath with exertion.     Ms. Kiara Medina is a 74 y/o woman with a history of MDR-resistant MAC and bronchiectasis who presents for follow up. She has stopped taking Stiolto due to feeling like her throat was clsoing up. Bevespi has been prescribed as an alternative, but has not yet arrived from her pharmacy. She stopped stiolto about 6 weeks ago with worsening of her pulmonary symptoms in the interim- she has worse DOE than her baseline and has noticed increased cough and sputum production. She coughs to the point of dry-heaving at times. She continues on her 4-drug MAC regimen (clofazamine, ethambutol, rifampin, and inhaled amikacin) and hypertonic saline. She has noticed worse appetite and 10# weight loss recently. She has been under stress due to her husband having dementia and her feeling poorly. She has tried taking boost supplements again. She followed up with Dr. Megan Medina from ID on 06/27/20 for MAC-- note reviewed.   OV 04/24/20: Ms. Kiara Medina is a 74 y/o woman with a history of MAI infection and bronchiectasis who presents for follow up. At her last visit she was started on Stiolto for progressively worsening DOE. She has noticed a significant improvement in shortness of breath.  She is able to walk further than she used to be able to.  She has no wheezing, sputum production.  She has noticed that her mouth is more dry, which she manages with drinking water frequently.   OV 03/27/20: Mrs. Kiara Medina is a 74 year old woman with a history of  bronchiectasis and persistent MAI with multiple drug resistance.  She follows with Dr. Megan Medina in infectious disease-recent note from 02/21/2020 reviewed.  She is feeling worse than her last visit.  She is worse dyspnea on exertion, increased coughing and sputum production.  She notices more wheezing, especially at night.  She is short of breath with usual activity, just walking to her mailbox and back.  Even sitting down when she is gardening she notices more shortness of breath.  She has saturations at she is seen as low as 89%, but they are usually 90 to 92% and improved with rest.  She is using her hypertonic saline nebs once daily.  She continues on her current NTM regimen including inhaled amikacin.  The past few months she has lost about 2 pounds. She has less energy than normal.  No fevers or chills.  OV 12/28/19: Ms. Kiara Medina is a 74 y/o woman being seen in follow up of chronic bronchiectasis and MAC. She was diagnosed with MAC in 2015, and was originally treated with 3-drug RAE therapy 3 days per week for about a year with negative cultures. She remained off therapy until~2 years ago when she had a recurrence. She is now on 4-drug daily therapy (ethambutol, rifampin, clofazamine, inhaled liposomal amikacin) with a MDR strain of MAC. She has persistently positive cultures. She follows with Dr. Megan Medina in [place]. She has had worse SOB for about 6 months, but attributes this to being at home more and going  out less. She has more trouble walking around without getting SOB. She has more trouble breathing at the temperature extremes. She denies wheezing. She has significant sputum production, ranging from white to yellow, sometimes with steaks of blood. Her appetite is poor and she has continued to slowly lose weight over time. She has not been drinking Ensure due to disliking the taste/ texture, but she has been trying to get protein in her diet. She no longer has night sweats, denies fevers, and she has no issues  sleeping. She uses her hypertonic saline nebs once daily before using her inhaled amikacin. She uses her flutter valve PRN, mostly when she feels that she is having trouble clearing her mucus.  She is scheduled to receive her covid vaccine next week [facility]). Former smoker-quit 1987, 25-pack-year history   Past Medical History:  Diagnosis Date  . Allergic rhinitis, cause unspecified   . Bronchiectasis without acute exacerbation ([place])   . Cough   . Sinusitis      Family History  Problem Relation Age of Onset  . Emphysema Sister   . Stroke Mother   . Clotting disorder Mother      Past Surgical History:  Procedure Laterality Date  . APPENDECTOMY    . CATARACT EXTRACTION  08/2013  . CHOLECYSTECTOMY    . TUBAL LIGATION    . VESICOVAGINAL FISTULA CLOSURE W/ TAH      Social History   Socioeconomic History  . Marital status: Married    Spouse name: Not on file  . Number of children: Not on file  . Years of education: Not on file  . Highest education level: Not on file  Occupational History  . Occupation: realtor  Tobacco Use  . Smoking status: Former Smoker    Packs/day: 1.00    Years: 20.00    Pack years: 20.00    Types: Cigarettes    Quit date: 12/14/1985    Years since quitting: 34.6  . Smokeless tobacco: Never Used  Vaping Use  . Vaping Use: Never used  Substance and Sexual Activity  . Alcohol use: Yes    Alcohol/week: 1.0 standard drink    Types: 1 Glasses of wine per week    Comment: occ  . Drug use: No  . Sexual activity: Not on file  Other Topics Concern  . Not on file  Social History Narrative  . Not on file   Social Determinants of Health   Financial Resource Strain:   . Difficulty of Paying Living Expenses: Not on file  Food Insecurity:   . Worried About Charity fundraiser in the Last Year: Not on file  . Ran Out of Food in the Last Year: Not on file  Transportation Needs:   . Lack of Transportation (Medical): Not on file  . Lack of  Transportation (Non-Medical): Not on file  Physical Activity:   . Days of Exercise per Week: Not on file  . Minutes of Exercise per Session: Not on file  Stress:   . Feeling of Stress : Not on file  Social Connections:   . Frequency of Communication with Friends and Family: Not on file  . Frequency of Social Gatherings with Friends and Family: Not on file  . Attends Religious Services: Not on file  . Active Member of Clubs or Organizations: Not on file  . Attends Archivist Meetings: Not on file  . Marital Status: Not on file  Intimate Partner Violence:   . Fear of  Current or Ex-Partner: Not on file  . Emotionally Abused: Not on file  . Physically Abused: Not on file  . Sexually Abused: Not on file     No Known Allergies   Immunization History  Administered Date(s) Administered  . Influenza Split 09/13/2012, 09/06/2014  . Influenza Whole 09/09/2009, 08/14/2010, 09/14/2011  . Influenza, High Dose Seasonal PF 10/10/2015  . Influenza,inj,Quad PF,6+ Mos 09/13/2013, 09/21/2016, 08/26/2017, 08/22/2018  . Influenza-Unspecified 08/29/2019  . Moderna SARS-COVID-2 Vaccination 01/03/2020, 01/31/2020  . Pneumococcal Conjugate-13 11/24/2014  . Pneumococcal Polysaccharide-23 07/17/2009    Outpatient Medications Prior to Visit  Medication Sig Dispense Refill  . acyclovir (ZOVIRAX) 400 MG tablet Take 400 mg by mouth 2 (two) times daily.    Marland Kitchen albuterol (PROVENTIL HFA;VENTOLIN HFA) 108 (90 Base) MCG/ACT inhaler Inhale 2 puffs into the lungs every 6 (six) hours as needed for wheezing or shortness of breath. 1 Inhaler 0  . Amikacin Sulfate Liposome (ARIKAYCE) 590 MG/8.4ML SUSP Inhale the contents of one vial (520m) via Lamira device 1 time daily as directed. 8.4 mL 2  . ascorbic acid (VITAMIN C) 500 MG tablet Take 500 mg by mouth daily.    .Marland Kitchenatorvastatin (LIPITOR) 20 MG tablet Take 20 mg by mouth daily.    . B Complex Vitamins (B COMPLEX-B12 PO) Take 1 tablet by mouth daily.    .  benzonatate (TESSALON PERLES) 100 MG capsule Take 1 capsule (100 mg total) by mouth 3 (three) times daily as needed for cough. 90 capsule 5  . calcium carbonate (OSCAL) 1500 (600 Ca) MG TABS tablet Take 600 mg of elemental calcium by mouth 2 (two) times daily with a meal.    . cetirizine (ZYRTEC) 10 MG tablet every morning.    . Cholecalciferol (VITAMIN D3) 125 MCG (5000 UT) TABS Take 1 tablet by mouth.    . CLOFAZIMINE PO Take 2 tablets by mouth daily.    .Marland Kitchenethambutol (MYAMBUTOL) 400 MG tablet Take 3 tablets (1,200 mg total) by mouth daily. 270 tablet 1  . Flaxseed, Linseed, (FLAX SEED OIL) 1300 MG CAPS Take by mouth daily.    . Glucosamine-Chondroitin 1500-1200 MG/30ML LIQD Take 1 tablet by mouth daily.    . Glycopyrrolate-Formoterol (BEVESPI AEROSPHERE) 9-4.8 MCG/ACT AERO Inhale 2 puffs into the lungs in the morning and at bedtime. 32.1 g 4  . hydrocortisone (PROCTOZONE-HC) 2.5 % rectal cream Proctozone-HC 2.5 % topical cream perineal applicator    . hydrocortisone 2.5 % cream hydrocortisone 2.5 % topical cream  APPLY A THIN LAYER TO THE  AFFECTED AREA THREE TIMES DAILY AS NEEDED    . Melatonin 10 MG CAPS 10 mg nightly as needed.    .Marland Kitchenomeprazole (PRILOSEC) 40 MG capsule Take 1 capsule (40 mg total) by mouth 2 (two) times daily. 180 capsule 1  . Probiotic Product (PROBIOTIC DAILY PO) Take 1 capsule by mouth daily.    .Marland KitchenRespiratory Therapy Supplies (FLUTTER) DEVI Use as directed. 1 each 0  . rifampin (RIFADIN) 300 MG capsule Take 2 capsules (600 mg total) by mouth daily. 180 capsule 1  . sodium chloride HYPERTONIC 3 % nebulizer solution INHALE THE CONTENTS OF 1 VIAL ( 4ML ) VIA NEBULIZER TWICE DAILY 720 mL 3  . Spacer/Aero-Holding Chambers (AEROCHAMBER MV) inhaler Use as instructed 1 each 2  . TURMERIC PO Take 2,000 mg by mouth daily.     . Tiotropium Bromide-Olodaterol (STIOLTO RESPIMAT) 2.5-2.5 MCG/ACT AERS Inhale 2 puffs into the lungs daily. 12 g 3  . Tiotropium Bromide-Olodaterol (  STIOLTO  RESPIMAT) 2.5-2.5 MCG/ACT AERS Inhale 2 puffs into the lungs daily. 4 g 0   No facility-administered medications prior to visit.    Review of Systems  Constitutional: Positive for weight loss. Negative for fever.  Respiratory: Positive for cough, hemoptysis, sputum production and shortness of breath. Negative for wheezing.   Cardiovascular: Negative for chest pain.  Gastrointestinal: Negative for nausea and vomiting.     Objective:   Vitals:   08/13/20 1340  BP: 112/60  Pulse: 98  Temp: 97.8 F (36.6 C)  TempSrc: Temporal  SpO2: 94%  Weight: 124 lb (56.2 kg)  Height: _0  (1.6 m)   94% on   RA BMI Readings from Last 3 Encounters:  08/13/20 21.97 kg/m  06/27/20 21.66 kg/m  04/24/20 22.11 kg/m   Wt Readings from Last 3 Encounters:  08/13/20 124 lb (56.2 kg)  06/27/20 124 lb 3.2 oz (56.3 kg)  04/24/20 126 lb 12.8 oz (57.5 kg)    Physical Exam Vitals reviewed.  Constitutional:      General: She is not in acute distress. HENT:     Head: Normocephalic and atraumatic.  Eyes:     General: No scleral icterus. Cardiovascular:     Rate and Rhythm: Normal rate and regular rhythm.     Heart sounds: No murmur heard.   Pulmonary:     Comments: Faint rhonchi and rhales in bases. No conversational dyspnea. No significant observed coughing. Abdominal:     General: There is no distension.     Palpations: Abdomen is soft.     Tenderness: There is no abdominal tenderness.  Musculoskeletal:        General: No swelling or deformity.     Cervical back: Neck supple.  Lymphadenopathy:     Cervical: No cervical adenopathy.  Skin:    General: Skin is warm and dry.     Findings: No rash.     Comments: Appears tan  Neurological:     Mental Status: She is alert.  Psychiatric:        Mood and Affect: Mood normal.        Behavior: Behavior normal.      CBC    Component Value Date/Time   WBC 6.1 06/27/2020 1614   RBC 4.17 06/27/2020 1614   HGB 13.3 06/27/2020 1614    HCT 40.2 06/27/2020 1614   PLT 169 06/27/2020 1614   MCV 96.4 06/27/2020 1614   MCH 31.9 06/27/2020 1614   MCHC 33.1 06/27/2020 1614   RDW 13.2 06/27/2020 1614   LYMPHSABS 1,929 01/10/2019 1657   EOSABS 289 01/10/2019 1657   BASOSABS 18 01/10/2019 1657    CHEMISTRY No results for input(s): NA, K, CL, CO2, GLUCOSE, BUN, CREATININE, CALCIUM, MG, PHOS in the last 168 hours. CrCl cannot be calculated (Patient's most recent lab result is older than the maximum 21 days allowed.).  12/28/2019: IgG 954 IgM 96 IgA 289 IgE 10 IgG subclasses normal (other than increased IgG subclass 3)  Cultures: 11/23/2019-MAI 05/17/2019-MAI 03/15/2019-MAI 01/10/2019 MAI 11/07/2018-MAI 10/10/2018-MAI  08/22/2018-MAI 07/11/2018-MAI 05/31/2018-MAI MAI susceptibilities-resistant clarithromycin, linezolid.  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  Susceptible to amikacin, cefepime, clarithromycin, imipenem, linezolid, trimethoprim sulfamethoxazole. 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  Compared to 2015 CXR,  more disease in the lower lobes.  CT chest high-resolution 01/29/2014- nodules throughout, a few starting to cavitate.  Most disease in bilateral upper lobes, RML, lingula.  Lower lobes relatively preserved.  HRCT chest 04/10/2020-progressive bronchiectasis, new right upper lobe cavity, significantly more nodules of varying sizes.  Tree-in-bud opacities diffusely.  Mucus impaction in airways.   Pulmonary Functions Testing Results: PFT Results Latest Ref Rng & Units 04/18/2018  FVC-Pre L  2.08  FVC-Predicted Pre % 79  Pre FEV1/FVC % % 75  FEV1-Pre L 1.57  FEV1-Predicted Pre % 80   2019-no significant obstruction, mildly reduced FVC.     Assessment & Plan:     ICD-10-CM   1. MAI (mycobacterium avium-intracellulare) (HCC)  A31.0   2. Bronchiectasis without complication ([place])  R00.7     Dyspnea on exertion likely due to chronic bronchiectasis-improved on LAMA/LABA; worsed when back off these meds. -Start Bevespi once available from pharmacy. If she has recurrent symptoms of her throat closing, recommend trial of duonebs TID -Please avoid ICS given increased risk of infections.  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  No obvious immunodeficiencies.  Progressive on CT over the last 6 years.  No evidence of hypersensitivity pneumonitis from inhaled amikacin on CT scan. -Continue hypertonic saline nebs with flutter valve twice daily. Reinforced the importance of this regimen as her maintenance to prevent exacerbations and progression of disease. -Continue current 4 drug MAI therapy and ID follow-up.  I agree with Dr. Megan Medina that she has limited treatment options and would likely progress faster off meds. My concern is her weight loss could be related to MAC rather than the meds. -Avoid ICS and steroids -Up-to-date on Covid, pneumonia. Needs annual flu vaccine.  Weight loss- concerned this may be a MAC secondary effect vs due to stress, complicated by worse DOE -Con't MAC 4-drug therapy for now as these meds to not have an obvious precipitating effect -Discussed methods to include increased calories in her diet. Keep favorate foods available; frequent snacks if unable to eat meals. Continue Boost shakes or can try other protein powders mixed with yogurt or ice cream to increase kcal and protein intake.  RTC in 2 months.     Current Outpatient Medications:  .  acyclovir (ZOVIRAX) 400 MG tablet, Take 400 mg by mouth 2 (two) times daily., Disp: ,  Rfl:  .  albuterol (PROVENTIL HFA;VENTOLIN HFA) 108 (90 Base) MCG/ACT inhaler, Inhale 2 puffs into the lungs every 6 (six) hours as needed for wheezing or shortness of breath., Disp: 1 Inhaler, Rfl: 0 .  Amikacin Sulfate Liposome (ARIKAYCE) 590 MG/8.4ML SUSP, Inhale the contents of one vial (550m) via Lamira device 1 time daily as directed., Disp: 8.4 mL, Rfl: 2 .  ascorbic acid (VITAMIN C) 500 MG tablet, Take 500 mg by mouth daily., Disp: , Rfl:  .  atorvastatin (LIPITOR) 20 MG tablet, Take 20 mg by mouth daily., Disp: , Rfl:  .  B Complex Vitamins (B COMPLEX-B12 PO), Take 1 tablet by mouth daily., Disp: , Rfl:  .  benzonatate (TESSALON PERLES) 100 MG capsule, Take 1 capsule (100 mg total) by mouth 3 (three) times daily as needed for cough., Disp: 90 capsule, Rfl: 5 .  calcium carbonate (OSCAL) 1500 (600 Ca) MG TABS tablet, Take 600 mg of elemental calcium by mouth 2 (two) times daily with a meal., Disp: , Rfl:  .  cetirizine (ZYRTEC) 10 MG tablet, every morning., Disp: , Rfl:  .  Cholecalciferol (VITAMIN D3)  125 MCG (5000 UT) TABS, Take 1 tablet by mouth., Disp: , Rfl:  .  CLOFAZIMINE PO, Take 2 tablets by mouth daily., Disp: , Rfl:  .  ethambutol (MYAMBUTOL) 400 MG tablet, Take 3 tablets (1,200 mg total) by mouth daily., Disp: 270 tablet, Rfl: 1 .  Flaxseed, Linseed, (FLAX SEED OIL) 1300 MG CAPS, Take by mouth daily., Disp: , Rfl:  .  Glucosamine-Chondroitin 1500-1200 MG/30ML LIQD, Take 1 tablet by mouth daily., Disp: , Rfl:  .  Glycopyrrolate-Formoterol (BEVESPI AEROSPHERE) 9-4.8 MCG/ACT AERO, Inhale 2 puffs into the lungs in the morning and at bedtime., Disp: 32.1 g, Rfl: 4 .  hydrocortisone (PROCTOZONE-HC) 2.5 % rectal cream, Proctozone-HC 2.5 % topical cream perineal applicator, Disp: , Rfl:  .  hydrocortisone 2.5 % cream, hydrocortisone 2.5 % topical cream  APPLY A THIN LAYER TO THE  AFFECTED AREA THREE TIMES DAILY AS NEEDED, Disp: , Rfl:  .  Melatonin 10 MG CAPS, 10 mg nightly as needed.,  Disp: , Rfl:  .  omeprazole (PRILOSEC) 40 MG capsule, Take 1 capsule (40 mg total) by mouth 2 (two) times daily., Disp: 180 capsule, Rfl: 1 .  Probiotic Product (PROBIOTIC DAILY PO), Take 1 capsule by mouth daily., Disp: , Rfl:  .  Respiratory Therapy Supplies (FLUTTER) DEVI, Use as directed., Disp: 1 each, Rfl: 0 .  rifampin (RIFADIN) 300 MG capsule, Take 2 capsules (600 mg total) by mouth daily., Disp: 180 capsule, Rfl: 1 .  sodium chloride HYPERTONIC 3 % nebulizer solution, INHALE THE CONTENTS OF 1 VIAL ( 4ML ) VIA NEBULIZER TWICE DAILY, Disp: 720 mL, Rfl: 3 .  Spacer/Aero-Holding Chambers (AEROCHAMBER MV) inhaler, Use as instructed, Disp: 1 each, Rfl: 2 .  TURMERIC PO, Take 2,000 mg by mouth daily. , Disp: , Rfl:      Julian Hy, DO [facility] 08/13/2020 2:14 PM

## 2020-08-13 NOTE — Patient Instructions (Addendum)
Thank you for visiting Dr. Carlis Abbott at [facility]. We recommend the following:  Start Bevespi two times daily. If you are having symptoms from this medication, let us know and we can start Duo-nebs 3 times daily.  Keep all MAC medications the same.   Return in about 2 months (around 10/13/2020).    Please do your part to reduce the spread of COVID-19.

## 2020-08-16 ENCOUNTER — Other Ambulatory Visit: Payer: Self-pay | Admitting: Internal Medicine

## 2020-08-16 ENCOUNTER — Other Ambulatory Visit: Payer: Self-pay

## 2020-08-16 DIAGNOSIS — A310 Pulmonary mycobacterial infection: Secondary | ICD-10-CM

## 2020-08-16 MED ORDER — RIFAMPIN 300 MG PO CAPS: 600.0000 mg | ORAL_CAPSULE | Freq: Every day | ORAL | 1 refills | Status: DC

## 2020-08-16 MED ORDER — ETHAMBUTOL HCL 400 MG PO TABS: 1200.0000 mg | ORAL_TABLET | Freq: Every day | ORAL | 1 refills | Status: DC

## 2020-08-20 ENCOUNTER — Ambulatory Visit: Payer: Medicare HMO | Admitting: Internal Medicine

## 2020-08-20 ENCOUNTER — Encounter: Payer: Self-pay | Admitting: Internal Medicine

## 2020-08-20 ENCOUNTER — Other Ambulatory Visit: Payer: Self-pay | Admitting: Internal Medicine

## 2020-08-20 ENCOUNTER — Other Ambulatory Visit: Payer: Self-pay

## 2020-08-20 DIAGNOSIS — A310 Pulmonary mycobacterial infection: Secondary | ICD-10-CM

## 2020-08-20 NOTE — Assessment & Plan Note (Signed)
She has slowly progressive Mycobacterium avium pneumonia.  Her isolate is multidrug-resistant and there are extremely limited options to improve her therapy.  She is in agreement with continuing her current 4 drug regimen.  She will follow-up in 2 months.  She is drinking 1 boost supplement daily.  I asked her to increase that to 2 to 3 cans daily.

## 2020-08-20 NOTE — Progress Notes (Signed)
[facility]  Patient Active Problem List   Diagnosis Date Noted  . 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 BRONCHIECTASIS 05/23/2009    Priority: Medium  . Depression 06/27/2020  . Unintentional weight loss 05/17/2019  . Dysphonia 01/11/2018  . Right shoulder pain 01/21/2017  . Encounter for hepatitis C screening test for low risk patient 01/21/2017  . GERD (gastroesophageal reflux disease) 02/21/2016  . 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 Dyslipidemia 11/14/2014  . Allergic rhinitis 05/03/2009    Patient's Medications  New Prescriptions   No medications on file  Previous Medications   ACYCLOVIR (ZOVIRAX) 400 MG TABLET    Take 400 mg by mouth 2 (two) times daily.   ALBUTEROL (PROVENTIL HFA;VENTOLIN HFA) 108 (90 BASE) MCG/ACT INHALER    Inhale 2 puffs into the lungs every 6 (six) hours as needed for wheezing or shortness of breath.   AMIKACIN SULFATE LIPOSOME (ARIKAYCE) 590 MG/8.4ML SUSP    Inhale the contents of one vial (567m) via Lamira device 1 time daily as directed.   ASCORBIC ACID (VITAMIN C) 500 MG TABLET    Take 500 mg by mouth daily.   ATORVASTATIN (LIPITOR) 20 MG TABLET    Take 20 mg by mouth daily.   B COMPLEX VITAMINS (B COMPLEX-B12 PO)    Take 1 tablet by mouth daily.   BENZONATATE (TESSALON PERLES) 100 MG CAPSULE    Take 1 capsule (100 mg total) by mouth 3 (three) times daily as needed for cough.   CALCIUM CARBONATE (OSCAL) 1500 (600 CA) MG TABS TABLET    Take 600 mg of elemental calcium by mouth 2 (two) times daily with a meal.   CETIRIZINE (ZYRTEC) 10 MG TABLET    every morning.   CHOLECALCIFEROL (VITAMIN D3) 125 MCG (5000 UT) TABS    Take 1 tablet by mouth.   CLOFAZIMINE PO    Take 2 tablets by mouth daily.   ETHAMBUTOL (MYAMBUTOL) 400 MG TABLET    Take 3 tablets (1,200 mg total) by mouth daily.   FLAXSEED, LINSEED, (FLAX SEED OIL) 1300 MG CAPS    Take by mouth daily.    GLUCOSAMINE-CHONDROITIN 1500-1200 MG/30ML LIQD    Take 1 tablet by mouth daily.   GLYCOPYRROLATE-FORMOTEROL (BEVESPI AEROSPHERE) 9-4.8 MCG/ACT AERO    Inhale 2 puffs into the lungs in the morning and at bedtime.   HYDROCORTISONE (PROCTOZONE-HC) 2.5 % RECTAL CREAM    Proctozone-HC 2.5 % topical cream perineal applicator   HYDROCORTISONE 2.5 % CREAM    hydrocortisone 2.5 % topical cream  APPLY A THIN LAYER TO THE  AFFECTED AREA THREE TIMES DAILY AS NEEDED   MELATONIN 10 MG CAPS    10 mg nightly as needed.   OMEPRAZOLE (PRILOSEC) 40 MG CAPSULE    Take 1 capsule (40 mg total) by mouth 2 (two) times daily.   PROBIOTIC PRODUCT (PROBIOTIC DAILY PO)    Take 1 capsule by mouth daily.   RESPIRATORY THERAPY SUPPLIES (FLUTTER) DEVI    Use as directed.   RIFAMPIN (RIFADIN) 300 MG CAPSULE    Take 2 capsules (600 mg total) by mouth daily.   SODIUM CHLORIDE HYPERTONIC 3 % NEBULIZER SOLUTION    INHALE THE CONTENTS OF 1 VIAL ( 4ML ) VIA NEBULIZER TWICE DAILY   SPACER/AERO-HOLDING CHAMBERS (AEROCHAMBER MV) INHALER    Use as instructed   TURMERIC PO    Take 2,000 mg by mouth daily.  Modified Medications   No medications on file  Discontinued Medications   No medications on file    Subjective: Bisma is in for her routine follow-up visit.  She is now completed 30 months of therapy for relapsed, antibiotic resistant Mycobacterium avium pneumonia.  She has been receiving oral ethambutol, rifampin and clofazimine along with aerosolized amikacin. She is having less shortness of breath.  She is bringing up more white to yellow sputum.  She feels a little less short of breath.  She says she is not having any significant problems tolerating her 3 oral antibiotics for aerosolized amikacin.   She has been worried about darkening of her skin and recent weight loss. She says that she notes over the last year that she gets full very easily and does not eat as much. She had a chest CT scan on 04/10/2020 which  showed:  IMPRESSION: 1. Progressive peribronchovascular nodularity/nodular consolidation with associated bronchiectasis, findings likely due to worsening mycobacterium avium complex. Findings are suggestive of an alternative diagnosis (not UIP) per consensus guidelines: Diagnosis of Idiopathic Pulmonary Fibrosis: An Official ATS/ERS/JRS/ALAT Clinical Practice Guideline. [place], Iss 5, [phone], Aug 14 2017. 2. Cavitary nodule in the right upper lobe is likely related to MAC. Difficult to definitively exclude malignancy. Consider follow-up CT chest without contrast in 3 months, as clinically indicated. 3. Basilar predominant ill-defined centrilobular nodularity can be seen with an infectious bronchiolitis or possibly subacute hypersensitivity pneumonitis. 4. Aortic atherosclerosis (ICD10-I70.0). Coronary artery calcification.  Over the last 2 weeks she has had several episodes where she would suddenly have pain that felt like a "vice grip" wrapping around her lower chest and upper abdomen.  She recalls it many of the times it would occur after she bent over to tie her shoes or pick up a package.  She says the pain will take her breath away.  It resolved spontaneously.  Review of Systems: Review of Systems  Constitutional: Positive for malaise/fatigue and weight loss. Negative for chills, diaphoresis and fever.  HENT: Positive for hearing loss and tinnitus.        She has had no change in her hearing loss or tinnitus since starting on amikacin and her audiograms have not revealed any acute changes.  Respiratory: Positive for cough, sputum production and shortness of breath. Negative for hemoptysis and wheezing.   Cardiovascular: Negative for chest pain.  Gastrointestinal: Positive for heartburn. Negative for abdominal pain, diarrhea, nausea and vomiting.  Musculoskeletal: Positive for joint pain.  Psychiatric/Behavioral: Negative for depression.    Past Medical  History:  Diagnosis Date  . Allergic rhinitis, cause unspecified   . Bronchiectasis without acute exacerbation ([place])   . Cough   . Sinusitis     Social History   Tobacco Use  . Smoking status: Former Smoker    Packs/day: 1.00    Years: 20.00    Pack years: 20.00    Types: Cigarettes    Quit date: 12/14/1985    Years since quitting: 34.7  . Smokeless tobacco: Never Used  Vaping Use  . Vaping Use: Never used  Substance Use Topics  . Alcohol use: Yes    Alcohol/week: 1.0 standard drink    Types: 1 Glasses of wine per week    Comment: occ  . Drug use: No    Family History  Problem Relation Age of Onset  . Emphysema Sister   . Stroke Mother   . Clotting disorder Mother     No  Known Allergies  Objective: Vitals:   08/20/20 1527  BP: 126/74  Pulse: (!) 101  Temp: 97.6 F (36.4 C)  TempSrc: Oral  Weight: 122 lb (55.3 kg)   Body mass index is 21.61 kg/m.  Physical Exam Constitutional:      Comments: Her weight is down about 10 pounds in the past 8 months.    Eyes:     Conjunctiva/sclera: Conjunctivae normal.  Cardiovascular:     Rate and Rhythm: Normal rate and regular rhythm.     Heart sounds: No murmur heard.   Pulmonary:     Effort: Pulmonary effort is normal.     Breath sounds: Normal breath sounds. No wheezing, rhonchi or rales.     Comments: She has a few crackles in her bases posteriorly.   Abdominal:     Palpations: Abdomen is soft.     Tenderness: There is no abdominal tenderness.  Skin:    Findings: No rash.     Comments: She points out her "tan" which is a side effect of clofazimine.  Neurological:     General: No focal deficit present.  Psychiatric:        Mood and Affect: Mood normal.     Lab Results CMP     Component Value Date/Time   NA 139 06/27/2020 1614   K 4.2 06/27/2020 1614   CL 104 06/27/2020 1614   CO2 27 06/27/2020 1614   GLUCOSE 87 06/27/2020 1614   BUN 13 06/27/2020 1614   CREATININE 0.91 06/27/2020 1614   CALCIUM  9.0 06/27/2020 1614   PROT 5.9 (L) 06/27/2020 1614   ALBUMIN 3.8 04/23/2015 1057   AST 24 06/27/2020 1614   ALT 19 06/27/2020 1614   ALKPHOS 55 04/23/2015 1057   BILITOT 0.4 06/27/2020 1614   GFRNONAA >60 06/01/2011 1457   GFRAA >60 06/01/2011 1457   Lab Results  Component Value Date   WBC 6.1 06/27/2020   HGB 13.3 06/27/2020   HCT 40.2 06/27/2020   MCV 96.4 06/27/2020   PLT 169 06/27/2020   Sputum AFB culture 05/17/2019; + for Mycobacterium avium again  Chest x-ray 05/17/2019 IMPRESSION: Stable chronic lung disease.  No acute cardiopulmonary abnormality.    By: Genevie Ann M.D.   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  Her isolate is multidrug-resistant and there are extremely limited options to improve her therapy.  She is in agreement with continuing her current 4 drug regimen.  She will follow-up in 2 months.  She is drinking 1 boost supplement daily.  I asked her to increase that to 2 to 3 cans daily.          Michel Bickers, MD [facility] for [facility] [phone] pager   [phone] cell 08/20/2020, 3:53 PM

## 2020-08-21 ENCOUNTER — Telehealth: Payer: Self-pay | Admitting: Pharmacist

## 2020-08-21 NOTE — Telephone Encounter (Signed)
Patient picked up 2 bottles of clofazimine at Dr. Hale Bogus office visit yesterday (9/7).

## 2020-08-23 ENCOUNTER — Other Ambulatory Visit: Payer: Self-pay | Admitting: Internal Medicine

## 2020-08-23 DIAGNOSIS — A310 Pulmonary mycobacterial infection: Secondary | ICD-10-CM

## 2020-08-27 DIAGNOSIS — J471 Bronchiectasis with (acute) exacerbation: Secondary | ICD-10-CM | POA: Diagnosis not present

## 2020-09-04 ENCOUNTER — Telehealth: Payer: Self-pay | Admitting: *Deleted

## 2020-09-04 DIAGNOSIS — A310 Pulmonary mycobacterial infection: Secondary | ICD-10-CM

## 2020-09-04 MED ORDER — RIFAMPIN 300 MG PO CAPS: 600.0000 mg | ORAL_CAPSULE | Freq: Every day | ORAL | 1 refills | Status: DC

## 2020-09-04 NOTE — Telephone Encounter (Signed)
[facility] pharmacy called, requesting transfer of rifampin prescription. Refill sent electronically to [facility] in [place], [place]. Ethambutol already sent to [facility].  Patient picks up clofazimine from [facility], gets [facility] from specialty pharmacy. Landis Gandy, RN

## 2020-09-24 ENCOUNTER — Ambulatory Visit: Payer: Medicare HMO | Admitting: Critical Care Medicine

## 2020-09-24 ENCOUNTER — Encounter: Payer: Self-pay | Admitting: Critical Care Medicine

## 2020-09-24 ENCOUNTER — Other Ambulatory Visit: Payer: Self-pay

## 2020-09-24 VITALS — BP 126/70 | HR 90 | Temp 97.4°F | Ht 63.5 in | Wt 121.8 lb

## 2020-09-24 DIAGNOSIS — J479 Bronchiectasis, uncomplicated: Secondary | ICD-10-CM | POA: Diagnosis not present

## 2020-09-24 DIAGNOSIS — Z23 Encounter for immunization: Secondary | ICD-10-CM

## 2020-09-24 DIAGNOSIS — R634 Abnormal weight loss: Secondary | ICD-10-CM | POA: Diagnosis not present

## 2020-09-24 DIAGNOSIS — A310 Pulmonary mycobacterial infection: Secondary | ICD-10-CM

## 2020-09-24 NOTE — Patient Instructions (Addendum)
Thank you for visiting Dr. Carlis Abbott at [facility]. We recommend the following: No orders of the defined types were placed in this encounter.  No orders of the defined types were placed in this encounter.   No orders of the defined types were placed in this encounter.   Return in about 2 months (around 11/24/2020). with Dr. Silas Flood (30 minutes visit).    Please do your part to reduce the spread of COVID-19.

## 2020-09-24 NOTE — Progress Notes (Signed)
Synopsis: Referred in 2010 for bronchiectasis by Nicoletta Dress, MD.  Previously patient of Dr. Normajean Baxter and Dr. Lake Bells.  Subjective:   PATIENT ID: Kiara Medina GENDER: female DOB: 1946/01/05, MRN: [id number]  Chief Complaint  Patient presents with   Follow-up    Bronchitis a month ago Sunday woke up with sore throat, head congestion went to store and bought mucinex and sore throat is gone still congested, no fever, cough with cream color sputum    Kiara Medina is a 74 y/o woman with a history of MDR MAC and bronchiectasis who presents for follow up. She has been on 4 drug suppressive therapy for 3.5 years with persistently positive cultures (clofazamine, ethambutol, rifampin, and inhaled amikacin). Recent office visit with ID on 08/20/2020 - note reviewed. She has continued to have steady weight loss, about another pound since her last visit. She drinks boost 1 can per day, but struggles to drink more than this. She eats protein bars for breakfast and tries to keep snacks around given her reduced appetite.  She had recent exacerbation which was treated by her PCP with a dose of steroids and antibiotics.  Her symptoms resolved.  Over the weekend she developed a viral URI with sneezing, rhinorrhea, sore throat, but no fevers or change in sputum.  She has been taking Mucinex to help with this. She continues on Bevespi and is using her hypertonic saline twice daily with her flutter valve.   08/13/20: Kiara Medina is a 74 y/o woman with a history of MDR-resistant MAC and bronchiectasis who presents for follow up. She has stopped taking Stiolto due to feeling like her throat was clsoing up. Bevespi has been prescribed as an alternative, but has not yet arrived from her pharmacy. She stopped stiolto about 6 weeks ago with worsening of her pulmonary symptoms in the interim- she has worse DOE than her baseline and has noticed increased cough and sputum production. She coughs to the point of dry-heaving at  times. She continues on her 4-drug MAC regimen (clofazamine, ethambutol, rifampin, and inhaled amikacin) and hypertonic saline. She has noticed worse appetite and 10# weight loss recently. She has been under stress due to her husband having dementia and her feeling poorly. She has tried taking boost supplements again. She followed up with Dr. Megan Salon from ID on 06/27/20 for MAC-- note reviewed.    OV 04/24/20: Kiara Medina is a 74 y/o woman with a history of MAI infection and bronchiectasis who presents for follow up. At her last visit she was started on Stiolto for progressively worsening DOE. She has noticed a significant improvement in shortness of breath.  She is able to walk further than she used to be able to.  She has no wheezing, sputum production.  She has noticed that her mouth is more dry, which she manages with drinking water frequently.   OV 03/27/20: Kiara Medina is a 74 year old woman with a history of bronchiectasis and persistent MAI with multiple drug resistance.  She follows with Dr. Megan Salon in infectious disease-recent note from 02/21/2020 reviewed.  She is feeling worse than her last visit.  She is worse dyspnea on exertion, increased coughing and sputum production.  She notices more wheezing, especially at night.  She is short of breath with usual activity, just walking to her mailbox and back.  Even sitting down when she is gardening she notices more shortness of breath.  She has saturations at she is seen as low as 89%, but they are usually  90 to 92% and improved with rest.  She is using her hypertonic saline nebs once daily.  She continues on her current NTM regimen including inhaled amikacin.  The past few months she has lost about 2 pounds. She has less energy than normal.  No fevers or chills.  OV 12/28/19: Kiara Medina is a 74 y/o woman being seen in follow up of chronic bronchiectasis and MAC. She was diagnosed with MAC in 2015, and was originally treated with 3-drug RAE therapy 3  days per week for about a year with negative cultures. She remained off therapy until~2 years ago when she had a recurrence. She is now on 4-drug daily therapy (ethambutol, rifampin, clofazamine, inhaled liposomal amikacin) with a MDR strain of MAC. She has persistently positive cultures. She follows with Dr. Megan Salon in [place]. She has had worse SOB for about 6 months, but attributes this to being at home more and going out less. She has more trouble walking around without getting SOB. She has more trouble breathing at the temperature extremes. She denies wheezing. She has significant sputum production, ranging from white to yellow, sometimes with steaks of blood. Her appetite is poor and she has continued to slowly lose weight over time. She has not been drinking Ensure due to disliking the taste/ texture, but she has been trying to get protein in her diet. She no longer has night sweats, denies fevers, and she has no issues sleeping. She uses her hypertonic saline nebs once daily before using her inhaled amikacin. She uses her flutter valve PRN, mostly when she feels that she is having trouble clearing her mucus.  She is scheduled to receive her covid vaccine next week [facility]). Former smoker-quit 1987, 25-pack-year history   Past Medical History:  Diagnosis Date   Allergic rhinitis, cause unspecified    Bronchiectasis without acute exacerbation (HCC)    Cough    Sinusitis      Family History  Problem Relation Age of Onset   Emphysema Sister    Stroke Mother    Clotting disorder Mother      Past Surgical History:  Procedure Laterality Date   APPENDECTOMY     CATARACT EXTRACTION  08/2013   CHOLECYSTECTOMY     TUBAL LIGATION     VESICOVAGINAL FISTULA CLOSURE W/ TAH      Social History   Socioeconomic History   Marital status: Married    Spouse name: Not on file   Number of children: Not on file   Years of education: Not on file   Highest education level: Not  on file  Occupational History   Occupation: realtor  Tobacco Use   Smoking status: Former Smoker    Packs/day: 1.00    Years: 20.00    Pack years: 20.00    Types: Cigarettes    Quit date: 12/14/1985    Years since quitting: 34.8   Smokeless tobacco: Never Used  Vaping Use   Vaping Use: Never used  Substance and Sexual Activity   Alcohol use: Yes    Alcohol/week: 1.0 standard drink    Types: 1 Glasses of wine per week    Comment: occ   Drug use: No   Sexual activity: Not on file  Other Topics Concern   Not on file  Social History Narrative   Not on file   Social Determinants of Health   Financial Resource Strain:    Difficulty of Paying Living Expenses: Not on file  Food Insecurity:  Worried About Charity fundraiser in the Last Year: Not on file   [employer] of Food in the Last Year: Not on file  Transportation Needs:    Lack of Transportation (Medical): Not on file   Lack of Transportation (Non-Medical): Not on file  Physical Activity:    Days of Exercise per Week: Not on file   Minutes of Exercise per Session: Not on file  Stress:    Feeling of Stress : Not on file  Social Connections:    Frequency of Communication with Friends and Family: Not on file   Frequency of Social Gatherings with Friends and Family: Not on file   Attends Religious Services: Not on file   Active Member of Clubs or Organizations: Not on file   Attends Archivist Meetings: Not on file   Marital Status: Not on file  Intimate Partner Violence:    Fear of Current or Ex-Partner: Not on file   Emotionally Abused: Not on file   Physically Abused: Not on file   Sexually Abused: Not on file     No Known Allergies   Immunization History  Administered Date(s) Administered   Influenza Split 09/13/2012, 09/06/2014   Influenza Whole 09/09/2009, 08/14/2010, 09/14/2011   Influenza, High Dose Seasonal PF 10/10/2015   Influenza,inj,Quad PF,6+ Mos 09/13/2013,  09/21/2016, 08/26/2017, 08/22/2018   Influenza-Unspecified 08/29/2019   Moderna SARS-COVID-2 Vaccination 01/03/2020, 01/31/2020, 07/26/2020   Pneumococcal Conjugate-13 11/24/2014   Pneumococcal Polysaccharide-23 07/17/2009    Outpatient Medications Prior to Visit  Medication Sig Dispense Refill   acyclovir (ZOVIRAX) 400 MG tablet Take 400 mg by mouth 2 (two) times daily.     albuterol (PROVENTIL HFA;VENTOLIN HFA) 108 (90 Base) MCG/ACT inhaler Inhale 2 puffs into the lungs every 6 (six) hours as needed for wheezing or shortness of breath. 1 Inhaler 0   ARIKAYCE 590 MG/8.4ML SUSP Inhale the contents of one vial (536m) via Lamira device 1 time daily as directed. 8.4 mL 3   ascorbic acid (VITAMIN C) 500 MG tablet Take 500 mg by mouth daily.     atorvastatin (LIPITOR) 20 MG tablet Take 20 mg by mouth daily.     B Complex Vitamins (B COMPLEX-B12 PO) Take 1 tablet by mouth daily.     benzonatate (TESSALON PERLES) 100 MG capsule Take 1 capsule (100 mg total) by mouth 3 (three) times daily as needed for cough. 90 capsule 5   calcium carbonate (OSCAL) 1500 (600 Ca) MG TABS tablet Take 600 mg of elemental calcium by mouth 2 (two) times daily with a meal.     cetirizine (ZYRTEC) 10 MG tablet every morning.     Cholecalciferol (VITAMIN D3) 125 MCG (5000 UT) TABS Take 1 tablet by mouth.     CLOFAZIMINE PO Take 2 tablets by mouth daily.     ethambutol (MYAMBUTOL) 400 MG tablet TAKE 3 TABLETS EVERY DAY 270 tablet 1   Flaxseed, Linseed, (FLAX SEED OIL) 1300 MG CAPS Take by mouth daily.     Glycopyrrolate-Formoterol (BEVESPI AEROSPHERE) 9-4.8 MCG/ACT AERO Inhale 2 puffs into the lungs in the morning and at bedtime. 32.1 g 4   hydrocortisone (PROCTOZONE-HC) 2.5 % rectal cream Proctozone-HC 2.5 % topical cream perineal applicator     hydrocortisone 2.5 % cream hydrocortisone 2.5 % topical cream  APPLY A THIN LAYER TO THE  AFFECTED AREA THREE TIMES DAILY AS NEEDED     Melatonin 10 MG CAPS  10 mg nightly as needed.     omeprazole (PRILOSEC)  40 MG capsule Take 1 capsule (40 mg total) by mouth 2 (two) times daily. 180 capsule 1   Probiotic Product (PROBIOTIC DAILY PO) Take 1 capsule by mouth daily.     Respiratory Therapy Supplies (FLUTTER) DEVI Use as directed. 1 each 0   rifampin (RIFADIN) 300 MG capsule Take 2 capsules (600 mg total) by mouth daily. 180 capsule 1   sodium chloride HYPERTONIC 3 % nebulizer solution INHALE THE CONTENTS OF 1 VIAL ( 4ML ) VIA NEBULIZER TWICE DAILY 720 mL 3   Spacer/Aero-Holding Chambers (AEROCHAMBER MV) inhaler Use as instructed 1 each 2   TURMERIC PO Take 2,000 mg by mouth daily.      Glucosamine-Chondroitin 1500-1200 MG/30ML LIQD Take 1 tablet by mouth daily.     No facility-administered medications prior to visit.    Review of Systems  Constitutional: Positive for weight loss. Negative for fever.  Respiratory: Positive for cough, hemoptysis, sputum production and shortness of breath. Negative for wheezing.   Cardiovascular: Negative for chest pain.  Gastrointestinal: Negative for nausea and vomiting.     Objective:   Vitals:   09/24/20 1434  BP: 126/70  Pulse: 90  Temp: (!) 97.4 F (36.3 C)  TempSrc: Temporal  SpO2: 94%  Weight: 121 lb 12.8 oz (55.2 kg)  Height: 5' 3.5" (1.613 m)   94% on   RA BMI Readings from Last 3 Encounters:  09/24/20 21.24 kg/m  08/20/20 21.61 kg/m  08/13/20 21.97 kg/m   Wt Readings from Last 3 Encounters:  09/24/20 121 lb 12.8 oz (55.2 kg)  08/20/20 122 lb (55.3 kg)  08/13/20 124 lb (56.2 kg)    Physical Exam Vitals reviewed.  Constitutional:      General: She is not in acute distress.    Appearance: Normal appearance. She is not ill-appearing.  HENT:     Head: Normocephalic and atraumatic.  Eyes:     General: No scleral icterus. Cardiovascular:     Rate and Rhythm: Normal rate and regular rhythm.     Heart sounds: No murmur heard.   Pulmonary:     Comments: Breathing  comfortably on room air, no conversational dyspnea.  Minimal bibasilar rhonchi, overall improved from previous exams.  No wheezing. Abdominal:     General: There is no distension.     Palpations: Abdomen is soft.  Musculoskeletal:        General: No swelling or deformity.     Cervical back: Neck supple.  Lymphadenopathy:     Cervical: No cervical adenopathy.  Skin:    General: Skin is warm and dry.     Findings: No rash.  Neurological:     General: No focal deficit present.     Mental Status: She is alert.     Coordination: Coordination normal.  Psychiatric:        Mood and Affect: Mood normal.        Behavior: Behavior normal.      CBC    Component Value Date/Time   WBC 6.1 06/27/2020 1614   RBC 4.17 06/27/2020 1614   HGB 13.3 06/27/2020 1614   HCT 40.2 06/27/2020 1614   PLT 169 06/27/2020 1614   MCV 96.4 06/27/2020 1614   MCH 31.9 06/27/2020 1614   MCHC 33.1 06/27/2020 1614   RDW 13.2 06/27/2020 1614   LYMPHSABS 1,929 01/10/2019 1657   EOSABS 289 01/10/2019 1657   BASOSABS 18 01/10/2019 1657    CHEMISTRY No results for input(s): NA, K, CL, CO2, GLUCOSE, BUN, CREATININE,  CALCIUM, MG, PHOS in the last 168 hours. CrCl cannot be calculated (Patient's most recent lab result is older than the maximum 21 days allowed.).  12/28/2019: IgG 954 IgM 96 IgA 289 IgE 10 IgG subclasses normal (other than increased IgG subclass 3)  Cultures: 11/23/2019-MAI 05/17/2019-MAI 03/15/2019-MAI 01/10/2019 MAI 11/07/2018-MAI 10/10/2018-MAI  08/22/2018-MAI 07/11/2018-MAI 05/31/2018-MAI MAI susceptibilities-resistant clarithromycin, linezolid.  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  Susceptible to amikacin, cefepime, clarithromycin, imipenem, linezolid, trimethoprim sulfamethoxazole.  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  Compared to 2015 CXR, more disease in the lower lobes.  CT chest high-resolution 01/29/2014- nodules throughout, a few starting to cavitate.  Most disease in bilateral upper lobes, RML, lingula.  Lower lobes relatively preserved.  HRCT chest 04/10/2020-progressive bronchiectasis, new right upper lobe cavity, significantly more nodules of varying sizes.  Tree-in-bud opacities diffusely.  Mucus impaction in airways.   Pulmonary Functions Testing Results: PFT Results Latest Ref Rng & Units 04/18/2018  FVC-Pre L 2.08  FVC-Predicted Pre % 79  Pre FEV1/FVC % % 75  FEV1-Pre L 1.57  FEV1-Predicted Pre % 80   2019-no significant obstruction, mildly reduced FVC.     Assessment & Plan:     ICD-10-CM   1. MAI (mycobacterium avium-intracellulare) (HCC)  A31.0   2. BRONCHIECTASIS  J47.9   3. Weight loss  R63.4     Dyspnea on exertion likely due to chronic bronchiectasis-improved on LAMA/LABA; worsed when back off these meds. -Con't Bevespi twice daily. -Please avoid ICS given increased risk of infections. -Continue using albuterol as needed -Continue airway clearance therapy with hypertonic saline twice daily and flutter valve -If still having increased upper respiratory symptoms in a week, recommend collecting sputum culture for routine Gram stain and culture.  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  No obvious immunodeficiencies.  Progressive on CT over the last 6 years.  No evidence of hypersensitivity pneumonitis from inhaled amikacin on CT scan. -Continue hypertonic saline nebs with flutter valve twice daily for airway clearance therapy regimen. -Continue current 4 drug MAI therapy and ID follow-up.  I  agree with Dr. Megan Salon that she has limited treatment options and would likely progress faster off meds. My concern is her weight loss could be related to MAC rather than the meds. -Avoid ICS and systemic steroids -Up-to-date on Covid, pneumonia. Flu vaccine today.  Weight loss- concerned this may be a MAC secondary effect, complicated by fatigue and DOE. -Con't MAC 4-drug therapy for now as these meds to not have an obvious precipitating effect -Con't snacking, trying to eat foods that sound good at the time, pushing protein drinks and bars.   RTC in 2 months with Dr. Silas Flood.     Current Outpatient Medications:    acyclovir (ZOVIRAX) 400 MG tablet, Take 400 mg by mouth 2 (two) times daily., Disp: , Rfl:    albuterol (PROVENTIL HFA;VENTOLIN HFA) 108 (90 Base) MCG/ACT inhaler, Inhale 2 puffs into the lungs every 6 (six) hours as needed for wheezing or shortness of breath., Disp: 1 Inhaler, Rfl: 0   ARIKAYCE 590 MG/8.4ML SUSP, Inhale the contents of one vial (51m) via Lamira device 1 time daily as directed., Disp: 8.4 mL, Rfl: 3   ascorbic acid (VITAMIN C) 500 MG tablet, Take 500  mg by mouth daily., Disp: , Rfl:    atorvastatin (LIPITOR) 20 MG tablet, Take 20 mg by mouth daily., Disp: , Rfl:    B Complex Vitamins (B COMPLEX-B12 PO), Take 1 tablet by mouth daily., Disp: , Rfl:    benzonatate (TESSALON PERLES) 100 MG capsule, Take 1 capsule (100 mg total) by mouth 3 (three) times daily as needed for cough., Disp: 90 capsule, Rfl: 5   calcium carbonate (OSCAL) 1500 (600 Ca) MG TABS tablet, Take 600 mg of elemental calcium by mouth 2 (two) times daily with a meal., Disp: , Rfl:    cetirizine (ZYRTEC) 10 MG tablet, every morning., Disp: , Rfl:    Cholecalciferol (VITAMIN D3) 125 MCG (5000 UT) TABS, Take 1 tablet by mouth., Disp: , Rfl:    CLOFAZIMINE PO, Take 2 tablets by mouth daily., Disp: , Rfl:    ethambutol (MYAMBUTOL) 400 MG tablet, TAKE 3 TABLETS EVERY DAY, Disp: 270 tablet,  Rfl: 1   Flaxseed, Linseed, (FLAX SEED OIL) 1300 MG CAPS, Take by mouth daily., Disp: , Rfl:    Glycopyrrolate-Formoterol (BEVESPI AEROSPHERE) 9-4.8 MCG/ACT AERO, Inhale 2 puffs into the lungs in the morning and at bedtime., Disp: 32.1 g, Rfl: 4   hydrocortisone (PROCTOZONE-HC) 2.5 % rectal cream, Proctozone-HC 2.5 % topical cream perineal applicator, Disp: , Rfl:    hydrocortisone 2.5 % cream, hydrocortisone 2.5 % topical cream  APPLY A THIN LAYER TO THE  AFFECTED AREA THREE TIMES DAILY AS NEEDED, Disp: , Rfl:    Melatonin 10 MG CAPS, 10 mg nightly as needed., Disp: , Rfl:    omeprazole (PRILOSEC) 40 MG capsule, Take 1 capsule (40 mg total) by mouth 2 (two) times daily., Disp: 180 capsule, Rfl: 1   Probiotic Product (PROBIOTIC DAILY PO), Take 1 capsule by mouth daily., Disp: , Rfl:    Respiratory Therapy Supplies (FLUTTER) DEVI, Use as directed., Disp: 1 each, Rfl: 0   rifampin (RIFADIN) 300 MG capsule, Take 2 capsules (600 mg total) by mouth daily., Disp: 180 capsule, Rfl: 1   sodium chloride HYPERTONIC 3 % nebulizer solution, INHALE THE CONTENTS OF 1 VIAL ( 4ML ) VIA NEBULIZER TWICE DAILY, Disp: 720 mL, Rfl: 3   Spacer/Aero-Holding Chambers (AEROCHAMBER MV) inhaler, Use as instructed, Disp: 1 each, Rfl: 2   TURMERIC PO, Take 2,000 mg by mouth daily. , Disp: , Rfl:      Julian Hy, DO [facility] 09/24/2020 3:15 PM

## 2020-10-22 ENCOUNTER — Ambulatory Visit: Payer: Medicare HMO | Admitting: Internal Medicine

## 2020-10-22 ENCOUNTER — Encounter: Payer: Self-pay | Admitting: Internal Medicine

## 2020-10-22 ENCOUNTER — Other Ambulatory Visit: Payer: Self-pay

## 2020-10-22 DIAGNOSIS — A310 Pulmonary mycobacterial infection: Secondary | ICD-10-CM

## 2020-10-22 NOTE — Assessment & Plan Note (Signed)
She has multidrug-resistant Mycobacterium avium and pneumonia has been slowly progressive past few years.  She prefers to continue her current 4 drug regimen fearing that much more rapidly if she stopped.  She will follow-up here in 3 months.

## 2020-10-22 NOTE — Progress Notes (Signed)
[facility]  Patient Active Problem List   Diagnosis Date Noted  . 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 BRONCHIECTASIS 05/23/2009    Priority: Medium  . Depression 06/27/2020  . Unintentional weight loss 05/17/2019  . Dysphonia 01/11/2018  . Right shoulder pain 01/21/2017  . Encounter for hepatitis C screening test for low risk patient 01/21/2017  . GERD (gastroesophageal reflux disease) 02/21/2016  . 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 Dyslipidemia 11/14/2014  . Allergic rhinitis 05/03/2009    Patient's Medications  New Prescriptions   No medications on file  Previous Medications   ACYCLOVIR (ZOVIRAX) 400 MG TABLET    Take 400 mg by mouth 2 (two) times daily.   ALBUTEROL (PROVENTIL HFA;VENTOLIN HFA) 108 (90 BASE) MCG/ACT INHALER    Inhale 2 puffs into the lungs every 6 (six) hours as needed for wheezing or shortness of breath.   ARIKAYCE 590 MG/8.4ML SUSP    Inhale the contents of one vial (534m) via [place]ira device 1 time daily as directed.   ASCORBIC ACID (VITAMIN C) 500 MG TABLET    Take 500 mg by mouth daily.   ATORVASTATIN (LIPITOR) 20 MG TABLET    Take 20 mg by mouth daily.   B COMPLEX VITAMINS (B COMPLEX-B12 PO)    Take 1 tablet by mouth daily.   BENZONATATE (TESSALON PERLES) 100 MG CAPSULE    Take 1 capsule (100 mg total) by mouth 3 (three) times daily as needed for cough.   CALCIUM CARBONATE (OSCAL) 1500 (600 CA) MG TABS TABLET    Take 600 mg of elemental calcium by mouth 2 (two) times daily with a meal.   CETIRIZINE (ZYRTEC) 10 MG TABLET    every morning.   CHOLECALCIFEROL (VITAMIN D3) 125 MCG (5000 UT) TABS    Take 1 tablet by mouth.   CLOFAZIMINE PO    Take 2 tablets by mouth daily.   ETHAMBUTOL (MYAMBUTOL) 400 MG TABLET    TAKE 3 TABLETS EVERY DAY   FLAXSEED, LINSEED, (FLAX SEED OIL) 1300 MG CAPS    Take by mouth daily.   GLYCOPYRROLATE-FORMOTEROL (BEVESPI AEROSPHERE) 9-4.8 MCG/ACT AERO     Inhale 2 puffs into the lungs in the morning and at bedtime.   HYDROCORTISONE (PROCTOZONE-HC) 2.5 % RECTAL CREAM    Proctozone-HC 2.5 % topical cream perineal applicator   HYDROCORTISONE 2.5 % CREAM    hydrocortisone 2.5 % topical cream  APPLY A THIN LAYER TO THE  AFFECTED AREA THREE TIMES DAILY AS NEEDED   MELATONIN 10 MG CAPS    10 mg nightly as needed.   OMEPRAZOLE (PRILOSEC) 40 MG CAPSULE    Take 1 capsule (40 mg total) by mouth 2 (two) times daily.   PROBIOTIC PRODUCT (PROBIOTIC DAILY PO)    Take 1 capsule by mouth daily.   RESPIRATORY THERAPY SUPPLIES (FLUTTER) DEVI    Use as directed.   RIFAMPIN (RIFADIN) 300 MG CAPSULE    Take 2 capsules (600 mg total) by mouth daily.   SODIUM CHLORIDE HYPERTONIC 3 % NEBULIZER SOLUTION    INHALE THE CONTENTS OF 1 VIAL ( 4ML ) VIA NEBULIZER TWICE DAILY   SPACER/AERO-HOLDING CHAMBERS (AEROCHAMBER MV) INHALER    Use as instructed   TURMERIC PO    Take 2,000 mg by mouth daily.   Modified Medications   No medications on file  Discontinued Medications   No medications on file    Subjective:  Kiara Medina is in for her routine follow-up visit.  She is now completed 33 months of therapy for relapsed, antibiotic resistant Mycobacterium avium pneumonia.  She has been receiving oral ethambutol, rifampin and clofazimine along with aerosolized amikacin.  Her PCP recently treated her for bronchitis.  She received ciprofloxacin and prednisone and was followed by oxacillin.  She still has a lot of sinus congestion.  She had some hemoptysis yesterday.  She is feeling more short of breath she says she is not having any significant problems tolerating her 3 oral antibiotics for aerosolized amikacin.   She had a chest CT scan on 04/10/2020 which showed:  IMPRESSION: 1. Progressive peribronchovascular nodularity/nodular consolidation with associated bronchiectasis, findings likely due to worsening mycobacterium avium complex. Findings are suggestive of an alternative diagnosis (not  UIP) per consensus guidelines: Diagnosis of Idiopathic Pulmonary Fibrosis: An Official ATS/ERS/JRS/ALAT Clinical Practice Guideline. [place], Iss 5, [id number], Aug 14 2017. 2. Cavitary nodule in the right upper lobe is likely related to MAC. Difficult to definitively exclude malignancy. Consider follow-up CT chest without contrast in 3 months, as clinically indicated. 3. Basilar predominant ill-defined centrilobular nodularity can be seen with an infectious bronchiolitis or possibly subacute hypersensitivity pneumonitis. 4. Aortic atherosclerosis (ICD10-I70.0). Coronary artery calcification.  26 year old husband was recently diagnosed with liver cancer.  He decided not to undergo any treatment.  He remains at home very stressful for duty.  She is having a great deal of difficulty taking care of him.  He has had several falls since he was unable to get him up for the fire department.  They are working with hospice but so far he has refused to go to the inpatient hospice unit like she wants him to.  Review of Systems: Review of Systems  Constitutional: Positive for malaise/fatigue and weight loss. Negative for chills, diaphoresis and fever.  HENT: Positive for hearing loss and tinnitus.        She has had no change in her hearing loss or tinnitus since starting on amikacin and her audiograms have not revealed any acute changes.  Respiratory: Positive for cough, hemoptysis, sputum production and shortness of breath. Negative for wheezing.   Cardiovascular: Negative for chest pain.  Gastrointestinal: Positive for heartburn. Negative for abdominal pain, diarrhea, nausea and vomiting.  Musculoskeletal: Positive for joint pain.  Psychiatric/Behavioral: Negative for depression.    Past Medical History:  Diagnosis Date  . Allergic rhinitis, cause unspecified   . Bronchiectasis without acute exacerbation ([place])   . Cough   . Sinusitis     Social History   Tobacco Use  .  Smoking status: Former Smoker    Packs/day: 1.00    Years: 20.00    Pack years: 20.00    Types: Cigarettes    Quit date: 12/14/1985    Years since quitting: 34.8  . Smokeless tobacco: Never Used  Vaping Use  . Vaping Use: Never used  Substance Use Topics  . Alcohol use: Yes    Alcohol/week: 1.0 standard drink    Types: 1 Glasses of wine per week    Comment: occ  . Drug use: No    Family History  Problem Relation Age of Onset  . Emphysema Sister   . Stroke Mother   . Clotting disorder Mother     No Known Allergies  Objective: Vitals:   10/22/20 1525  BP: 105/68  Pulse: 94  Temp: 98 F (36.7 C)  Weight: 115 lb (52.2 kg)  Height:  _0  (1.6 m)   Body mass index is 20.37 kg/m.  Physical Exam Constitutional:      Comments: She remains cachectic but her weight is unchanged since her last few visits.  Eyes:     Conjunctiva/sclera: Conjunctivae normal.  Cardiovascular:     Rate and Rhythm: Normal rate and regular rhythm.     Heart sounds: No murmur heard.   Pulmonary:     Effort: Pulmonary effort is normal.     Breath sounds: Normal breath sounds. No wheezing, rhonchi or rales.     Comments: She has a few crackles in her bases posteriorly.   Abdominal:     Palpations: Abdomen is soft.     Tenderness: There is no abdominal tenderness.  Skin:    Findings: No rash.     Comments: She points out her "tan" which is a side effect of clofazimine.  Neurological:     General: No focal deficit present.  Psychiatric:        Mood and Affect: Mood normal.     Lab Results CMP     Component Value Date/Time   NA 139 06/27/2020 1614   K 4.2 06/27/2020 1614   CL 104 06/27/2020 1614   CO2 27 06/27/2020 1614   GLUCOSE 87 06/27/2020 1614   BUN 13 06/27/2020 1614   CREATININE 0.91 06/27/2020 1614   CALCIUM 9.0 06/27/2020 1614   PROT 5.9 (L) 06/27/2020 1614   ALBUMIN 3.8 04/23/2015 1057   AST 24 06/27/2020 1614   ALT 19 06/27/2020 1614   ALKPHOS 55 04/23/2015 1057    BILITOT 0.4 06/27/2020 1614   GFRNONAA >60 06/01/2011 1457   GFRAA >60 06/01/2011 1457   Lab Results  Component Value Date   WBC 6.1 06/27/2020   HGB 13.3 06/27/2020   HCT 40.2 06/27/2020   MCV 96.4 06/27/2020   PLT 169 06/27/2020   Sputum AFB culture 05/17/2019; + for Mycobacterium avium again  Chest x-ray 05/17/2019 IMPRESSION: Stable chronic lung disease.  No acute cardiopulmonary abnormality.    By: Genevie Ann M.D.   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  She prefers to continue her current 4 drug regimen fearing that much more rapidly if she stopped.  She will follow-up here in 3 months.          Michel Bickers, MD [facility] [phone] pager   [phone] cell 10/22/2020, 3:48 PM

## 2020-10-28 ENCOUNTER — Other Ambulatory Visit: Payer: Self-pay | Admitting: Critical Care Medicine

## 2020-10-29 DIAGNOSIS — J471 Bronchiectasis with (acute) exacerbation: Secondary | ICD-10-CM | POA: Diagnosis not present

## 2020-11-18 ENCOUNTER — Telehealth: Payer: Self-pay | Admitting: Pharmacist

## 2020-11-18 ENCOUNTER — Encounter: Payer: Self-pay | Admitting: Pharmacist

## 2020-11-18 NOTE — Telephone Encounter (Addendum)
2 bottles of clofazimine (~3 month supply) are located in the pharmacy office when patient needs a refill. 

## 2020-11-19 DIAGNOSIS — A310 Pulmonary mycobacterial infection: Secondary | ICD-10-CM | POA: Diagnosis not present

## 2020-11-19 DIAGNOSIS — K219 Gastro-esophageal reflux disease without esophagitis: Secondary | ICD-10-CM | POA: Diagnosis not present

## 2020-11-19 DIAGNOSIS — E785 Hyperlipidemia, unspecified: Secondary | ICD-10-CM | POA: Diagnosis not present

## 2020-11-19 DIAGNOSIS — R7301 Impaired fasting glucose: Secondary | ICD-10-CM | POA: Diagnosis not present

## 2020-11-19 DIAGNOSIS — J479 Bronchiectasis, uncomplicated: Secondary | ICD-10-CM | POA: Diagnosis not present

## 2020-11-19 DIAGNOSIS — H6123 Impacted cerumen, bilateral: Secondary | ICD-10-CM | POA: Diagnosis not present

## 2020-11-19 DIAGNOSIS — E559 Vitamin D deficiency, unspecified: Secondary | ICD-10-CM | POA: Diagnosis not present

## 2020-11-19 DIAGNOSIS — J3489 Other specified disorders of nose and nasal sinuses: Secondary | ICD-10-CM | POA: Diagnosis not present

## 2020-11-21 ENCOUNTER — Ambulatory Visit: Payer: Medicare HMO | Admitting: Pulmonary Disease

## 2020-11-21 ENCOUNTER — Other Ambulatory Visit: Payer: Self-pay

## 2020-11-21 ENCOUNTER — Encounter: Payer: Self-pay | Admitting: Pulmonary Disease

## 2020-11-21 VITALS — BP 132/68 | HR 89 | Temp 98.1°F | Ht 63.5 in | Wt 114.6 lb

## 2020-11-21 DIAGNOSIS — R0609 Other forms of dyspnea: Secondary | ICD-10-CM

## 2020-11-21 DIAGNOSIS — R0600 Dyspnea, unspecified: Secondary | ICD-10-CM

## 2020-11-21 DIAGNOSIS — J479 Bronchiectasis, uncomplicated: Secondary | ICD-10-CM | POA: Diagnosis not present

## 2020-11-21 NOTE — Progress Notes (Signed)
Synopsis: Referred in 2010 for bronchiectasis by Nicoletta Dress, MD.  Previously patient of Dr. Normajean Baxter and Dr. Lake Bells and Dr. Carlis Abbott.  Subjective:   PATIENT ID: Kiara Medina GENDER: female DOB: 1946-08-31, MRN: [id number]  Chief Complaint  Patient presents with  . MAIC    Dr. Carlis Abbott patient establishing care for bronchiectasis treatment.     Kiara Medina is a 74 y/o woman with a history of MDR MAC and bronchiectasis who presents for follow up. She has been on 4 drug suppressive therapy for 3.5 years with persistently positive cultures (clofazamine, ethambutol, rifampin, and inhaled amikacin). Recent office visit with ID on 10/22/2020 - note reviewed. Continues with weight loss, down 7 pounds since last office visit. Now supplementing with 1 boost in evening. Notes ongoing DOE. Hs good days and bad. Today is good. No increase in cough or sputum production. Notes significant decrease in activity over last 2 years. Retired 2 years ago.    09/2020 Kiara Medina is a 74 y/o woman with a history of MDR MAC and bronchiectasis who presents for follow up. She has been on 4 drug suppressive therapy for 3.5 years with persistently positive cultures (clofazamine, ethambutol, rifampin, and inhaled amikacin). Recent office visit with ID on 08/20/2020 - note reviewed. She has continued to have steady weight loss, about another pound since her last visit. She drinks boost 1 can per day, but struggles to drink more than this. She eats protein bars for breakfast and tries to keep snacks around given her reduced appetite.  She had recent exacerbation which was treated by her PCP with a dose of steroids and antibiotics.  Her symptoms resolved.  Over the weekend she developed a viral URI with sneezing, rhinorrhea, sore throat, but no fevers or change in sputum.  She has been taking Mucinex to help with this. She continues on Bevespi and is using her hypertonic saline twice daily with her flutter valve.   08/13/20: Ms.  Medina is a 74 y/o woman with a history of MDR-resistant MAC and bronchiectasis who presents for follow up. She has stopped taking Stiolto due to feeling like her throat was clsoing up. Bevespi has been prescribed as an alternative, but has not yet arrived from her pharmacy. She stopped stiolto about 6 weeks ago with worsening of her pulmonary symptoms in the interim- she has worse DOE than her baseline and has noticed increased cough and sputum production. She coughs to the point of dry-heaving at times. She continues on her 4-drug MAC regimen (clofazamine, ethambutol, rifampin, and inhaled amikacin) and hypertonic saline. She has noticed worse appetite and 10# weight loss recently. She has been under stress due to her husband having dementia and her feeling poorly. She has tried taking boost supplements again. She followed up with Dr. Megan Salon from ID on 06/27/20 for MAC-- note reviewed.    OV 04/24/20: Kiara Medina is a 74 y/o woman with a history of MAI infection and bronchiectasis who presents for follow up. At her last visit she was started on Stiolto for progressively worsening DOE. She has noticed a significant improvement in shortness of breath.  She is able to walk further than she used to be able to.  She has no wheezing, sputum production.  She has noticed that her mouth is more dry, which she manages with drinking water frequently.   OV 03/27/20: Kiara Medina is a 74 year old woman with a history of bronchiectasis and persistent MAI with multiple drug resistance.  She follows with  Dr. Megan Salon in infectious disease-recent note from 02/21/2020 reviewed.  She is feeling worse than her last visit.  She is worse dyspnea on exertion, increased coughing and sputum production.  She notices more wheezing, especially at night.  She is short of breath with usual activity, just walking to her mailbox and back.  Even sitting down when she is gardening she notices more shortness of breath.  She has saturations at  she is seen as low as 89%, but they are usually 90 to 92% and improved with rest.  She is using her hypertonic saline nebs once daily.  She continues on her current NTM regimen including inhaled amikacin.  The past few months she has lost about 2 pounds. She has less energy than normal.  No fevers or chills.  OV 12/28/19: Kiara Medina is a 74 y/o woman being seen in follow up of chronic bronchiectasis and MAC. She was diagnosed with MAC in 2015, and was originally treated with 3-drug RAE therapy 3 days per week for about a year with negative cultures. She remained off therapy until~2 years ago when she had a recurrence. She is now on 4-drug daily therapy (ethambutol, rifampin, clofazamine, inhaled liposomal amikacin) with a MDR strain of MAC. She has persistently positive cultures. She follows with Dr. Megan Salon in [place]. She has had worse SOB for about 6 months, but attributes this to being at home more and going out less. She has more trouble walking around without getting SOB. She has more trouble breathing at the temperature extremes. She denies wheezing. She has significant sputum production, ranging from white to yellow, sometimes with steaks of blood. Her appetite is poor and she has continued to slowly lose weight over time. She has not been drinking Ensure due to disliking the taste/ texture, but she has been trying to get protein in her diet. She no longer has night sweats, denies fevers, and she has no issues sleeping. She uses her hypertonic saline nebs once daily before using her inhaled amikacin. She uses her flutter valve PRN, mostly when she feels that she is having trouble clearing her mucus.  She is scheduled to receive her covid vaccine next week [facility]). Former smoker-quit 1987, 25-pack-year history   Past Medical History:  Diagnosis Date  . Allergic rhinitis, cause unspecified   . Bronchiectasis without acute exacerbation ([place])   . Cough   . Sinusitis      Family History   Problem Relation Age of Onset  . Emphysema Sister   . Stroke Mother   . Clotting disorder Mother      Past Surgical History:  Procedure Laterality Date  . APPENDECTOMY    . CATARACT EXTRACTION  08/2013  . CHOLECYSTECTOMY    . TUBAL LIGATION    . VESICOVAGINAL FISTULA CLOSURE W/ TAH      Social History   Socioeconomic History  . Marital status: Married    Spouse name: Not on file  . Number of children: Not on file  . Years of education: Not on file  . Highest education level: Not on file  Occupational History  . Occupation: realtor  Tobacco Use  . Smoking status: Former Smoker    Packs/day: 1.00    Years: 20.00    Pack years: 20.00    Types: Cigarettes    Quit date: 12/14/1985    Years since quitting: 34.9  . Smokeless tobacco: Never Used  Vaping Use  . Vaping Use: Never used  Substance and Sexual Activity  .  Alcohol use: Yes    Alcohol/week: 1.0 standard drink    Types: 1 Glasses of wine per week    Comment: occ  . Drug use: No  . Sexual activity: Not on file  Other Topics Concern  . Not on file  Social History Narrative  . Not on file   Social Determinants of Health   Financial Resource Strain: Not on file  Food Insecurity: Not on file  Transportation Needs: Not on file  Physical Activity: Not on file  Stress: Not on file  Social Connections: Not on file  Intimate Partner Violence: Not on file     No Known Allergies   Immunization History  Administered Date(s) Administered  . Fluad Quad(high Dose 65+) 09/24/2020  . Influenza Split 09/13/2012, 09/06/2014  . Influenza Whole 09/09/2009, 08/14/2010, 09/14/2011  . Influenza, High Dose Seasonal PF 10/10/2015  . Influenza,inj,Quad PF,6+ Mos 09/13/2013, 09/21/2016, 08/26/2017, 08/22/2018  . Influenza-Unspecified 08/29/2019  . Moderna SARS-COVID-2 Vaccination 01/03/2020, 01/31/2020, 08/02/2020  . Pneumococcal Conjugate-13 11/24/2014  . Pneumococcal Polysaccharide-23 07/17/2009    Outpatient Medications  Prior to Visit  Medication Sig Dispense Refill  . acyclovir (ZOVIRAX) 400 MG tablet Take 400 mg by mouth 2 (two) times daily.    Marland Kitchen albuterol (PROVENTIL HFA;VENTOLIN HFA) 108 (90 Base) MCG/ACT inhaler Inhale 2 puffs into the lungs every 6 (six) hours as needed for wheezing or shortness of breath. 1 Inhaler 0  . ARIKAYCE 590 MG/8.4ML SUSP Inhale the contents of one vial (535m) via Lamira device 1 time daily as directed. 8.4 mL 3  . ascorbic acid (VITAMIN C) 500 MG tablet Take 500 mg by mouth daily.    .Marland Kitchenatorvastatin (LIPITOR) 20 MG tablet Take 20 mg by mouth daily.    . B Complex Vitamins (B COMPLEX-B12 PO) Take 1 tablet by mouth daily.    . benzonatate (TESSALON PERLES) 100 MG capsule Take 1 capsule (100 mg total) by mouth 3 (three) times daily as needed for cough. 90 capsule 5  . calcium carbonate (OSCAL) 1500 (600 Ca) MG TABS tablet Take 600 mg of elemental calcium by mouth 2 (two) times daily with a meal.    . cetirizine (ZYRTEC) 10 MG tablet every morning.    . Cholecalciferol (VITAMIN D3) 125 MCG (5000 UT) TABS Take 1 tablet by mouth.    . CLOFAZIMINE PO Take 2 tablets by mouth daily.    .Marland Kitchenethambutol (MYAMBUTOL) 400 MG tablet TAKE 3 TABLETS EVERY DAY 270 tablet 1  . Flaxseed, Linseed, (FLAX SEED OIL) 1300 MG CAPS Take by mouth daily.    . Glycopyrrolate-Formoterol (BEVESPI AEROSPHERE) 9-4.8 MCG/ACT AERO Inhale 2 puffs into the lungs in the morning and at bedtime. 32.1 g 4  . hydrocortisone (ANUSOL-HC) 2.5 % rectal cream Proctozone-HC 2.5 % topical cream perineal applicator    . hydrocortisone 2.5 % cream hydrocortisone 2.5 % topical cream  APPLY A THIN LAYER TO THE  AFFECTED AREA THREE TIMES DAILY AS NEEDED    . Melatonin 10 MG CAPS 10 mg nightly as needed.    .Marland Kitchenomeprazole (PRILOSEC) 40 MG capsule TAKE 1 CAPSULE TWICE DAILY 180 capsule 1  . Probiotic Product (PROBIOTIC DAILY PO) Take 1 capsule by mouth daily.    .Marland KitchenRespiratory Therapy Supplies (FLUTTER) DEVI Use as directed. 1 each 0  .  rifampin (RIFADIN) 300 MG capsule Take 2 capsules (600 mg total) by mouth daily. 180 capsule 1  . sodium chloride HYPERTONIC 3 % nebulizer solution INHALE THE CONTENTS OF 1 VIAL (  4ML ) VIA NEBULIZER TWICE DAILY 720 mL 3  . Spacer/Aero-Holding Chambers (AEROCHAMBER MV) inhaler Use as instructed 1 each 2  . TURMERIC PO Take 2,000 mg by mouth daily.     Marland Kitchen zinc gluconate 50 MG tablet Take 50 mg by mouth daily.     No facility-administered medications prior to visit.    Review of Systems  No chest pain.  No hemoptysis.  No worsening sputum production or change in quality of sputum.   Objective:   Vitals:   11/21/20 1431  BP: 132/68  Pulse: 89  Temp: 98.1 F (36.7 C)  TempSrc: Temporal  SpO2: 98%  Weight: 114 lb 9.6 oz (52 kg)  Height: 5' 3.5" (1.613 m)   98% on   RA BMI Readings from Last 3 Encounters:  11/21/20 19.98 kg/m  10/22/20 20.37 kg/m  09/24/20 21.24 kg/m   Wt Readings from Last 3 Encounters:  11/21/20 114 lb 9.6 oz (52 kg)  10/22/20 115 lb (52.2 kg)  09/24/20 121 lb 12.8 oz (55.2 kg)    Physical Exam BP 132/68 (BP Location: Left Arm, Cuff Size: Normal)   Pulse 89   Temp 98.1 F (36.7 C) (Temporal)   Ht 5' 3.5" (1.613 m)   Wt 114 lb 9.6 oz (52 kg)   SpO2 98%   BMI 19.98 kg/m  General: Well-appearing in no acute distress, thin Eyes: EOMI, icterus Respiratory: Mild crackles in the bases, otherwise clear to auscultation Cardiovascular: Regular rhythm, no murmur Extremities: Warm, no edema     CBC    Component Value Date/Time   WBC 6.1 06/27/2020 1614   RBC 4.17 06/27/2020 1614   HGB 13.3 06/27/2020 1614   HCT 40.2 06/27/2020 1614   PLT 169 06/27/2020 1614   MCV 96.4 06/27/2020 1614   MCH 31.9 06/27/2020 1614   MCHC 33.1 06/27/2020 1614   RDW 13.2 06/27/2020 1614   LYMPHSABS 1,929 01/10/2019 1657   EOSABS 289 01/10/2019 1657   BASOSABS 18 01/10/2019 1657    CHEMISTRY No results for input(s): NA, K, CL, CO2, GLUCOSE, BUN, CREATININE,  CALCIUM, MG, PHOS in the last 168 hours. CrCl cannot be calculated (Patient's most recent lab result is older than the maximum 21 days allowed.).  12/28/2019: IgG 954 IgM 96 IgA 289 IgE 10 IgG subclasses normal (other than increased IgG subclass 3)  Cultures: 11/23/2019-MAI 05/17/2019-MAI 03/15/2019-MAI 01/10/2019 MAI 11/07/2018-MAI 10/10/2018-MAI  08/22/2018-MAI 07/11/2018-MAI 05/31/2018-MAI MAI susceptibilities-resistant clarithromycin, linezolid.  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  Susceptible to amikacin, cefepime, clarithromycin, imipenem, linezolid, trimethoprim sulfamethoxazole. 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  Tree-in-bud opacities diffusely.  Mucus impaction in airways.   Pulmonary Functions Testing Results: PFT Results Latest Ref Rng & Units 04/18/2018  FVC-Pre L 2.08  FVC-Predicted Pre % 79  Pre FEV1/FVC % % 75  FEV1-Pre L 1.57  FEV1-Predicted Pre % 80   2019-no significant obstruction, mildly reduced FVC.     Assessment & Plan:     ICD-10-CM   1. BRONCHIECTASIS  J47.9 Lower Respiratory Culture    AFB Culture & Smear  2. DOE (dyspnea on exertion)  R06.00 ECHOCARDIOGRAM COMPLETE    Dyspnea on exertion likely due to chronic bronchiectasis-improved on LAMA/LABA; worsed when back off these meds. Component of deconditioning. -  Con't Bevespi twice daily. -Please avoid ICS given increased risk of infections. -Continue using albuterol as needed -Continue airway clearance therapy with hypertonic saline twice daily and flutter valve --TTE to eval  cardiac causes if present --Disucssed pulmonary rehab, strongly advise if TTE unrevealing.  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  No obvious immunodeficiencies.  Progressive on CT over the last 6 years.  No evidence of hypersensitivity pneumonitis from inhaled amikacin on CT scan. -Continue hypertonic saline nebs with flutter valve twice daily for airway clearance therapy regimen. -Continue current 4 drug MAI therapy and ID follow-up.  I agree with Dr. Megan Salon that she has limited treatment options and would likely progress faster off meds. My concern is her weight loss could be related to MAC rather than the meds. -Avoid ICS and systemic steroids -Up-to-date on Covid, pneumonia. Flu vaccine today. --obtain AFB, LRCx at next visit - cups provided  Weight loss- concerned this may be a MAC secondary effect, complicated by fatigue and DOE. -Con't MAC 4-drug therapy for now as these meds to not have an obvious precipitating effect -Con't snacking, increase boost from qday to TID (breakfast, 2 at dinner)  RTC in 3 months with Dr. Silas Flood.     Current Outpatient Medications:  .  acyclovir (ZOVIRAX) 400 MG tablet, Take 400 mg by mouth 2 (two) times daily., Disp: , Rfl:  .  albuterol (PROVENTIL HFA;VENTOLIN HFA) 108 (90 Base) MCG/ACT inhaler, Inhale 2 puffs into the lungs every 6 (six) hours as needed for wheezing or shortness of breath., Disp: 1 Inhaler, Rfl: 0 .  ARIKAYCE 590 MG/8.4ML SUSP, Inhale the contents of one vial (574m) via Lamira device 1 time daily as directed., Disp: 8.4 mL, Rfl: 3 .  ascorbic acid (VITAMIN C) 500 MG tablet, Take 500 mg by mouth daily., Disp: , Rfl:  .  atorvastatin (LIPITOR) 20 MG tablet, Take 20 mg by mouth daily., Disp: , Rfl:  .  B Complex Vitamins (B COMPLEX-B12 PO), Take 1 tablet by mouth daily., Disp: , Rfl:  .  benzonatate (TESSALON PERLES) 100 MG capsule, Take 1 capsule (100 mg total) by mouth 3 (three) times daily as needed  for cough., Disp: 90 capsule, Rfl: 5 .  calcium carbonate (OSCAL) 1500 (600 Ca) MG TABS tablet, Take 600 mg of elemental calcium by mouth 2 (two) times daily with a meal., Disp: , Rfl:  .  cetirizine (ZYRTEC) 10 MG tablet, every morning., Disp: , Rfl:  .  Cholecalciferol (VITAMIN D3) 125 MCG (5000 UT) TABS, Take 1 tablet by mouth., Disp: , Rfl:  .  CLOFAZIMINE PO, Take 2 tablets by mouth daily., Disp: , Rfl:  .  ethambutol (MYAMBUTOL) 400 MG tablet, TAKE 3 TABLETS EVERY DAY, Disp: 270 tablet, Rfl: 1 .  Flaxseed, Linseed, (FLAX SEED OIL) 1300 MG CAPS, Take by mouth daily., Disp: , Rfl:  .  Glycopyrrolate-Formoterol (BEVESPI AEROSPHERE) 9-4.8 MCG/ACT AERO, Inhale 2 puffs into the lungs in the morning and at bedtime., Disp: 32.1 g, Rfl: 4 .  hydrocortisone (ANUSOL-HC) 2.5 % rectal cream, Proctozone-HC 2.5 % topical cream perineal applicator, Disp: , Rfl:  .  hydrocortisone 2.5 % cream, hydrocortisone 2.5 % topical cream  APPLY A THIN LAYER TO THE  AFFECTED AREA THREE TIMES DAILY AS NEEDED, Disp: , Rfl:  .  Melatonin 10 MG CAPS, 10 mg nightly as needed., Disp: , Rfl:  .  omeprazole (PRILOSEC) 40 MG capsule, TAKE 1 CAPSULE TWICE DAILY, Disp: 180 capsule, Rfl: 1 .  Probiotic Product (PROBIOTIC DAILY PO), Take 1 capsule by mouth daily., Disp: , Rfl:  .  Respiratory Therapy Supplies (FLUTTER) DEVI, Use as directed., Disp: 1 each, Rfl: 0 .  rifampin (RIFADIN) 300 MG capsule, Take 2 capsules (600 mg total) by mouth daily., Disp: 180 capsule, Rfl: 1 .  sodium chloride HYPERTONIC 3 % nebulizer solution, INHALE THE CONTENTS OF 1 VIAL ( 4ML ) VIA NEBULIZER TWICE DAILY, Disp: 720 mL, Rfl: 3 .  Spacer/Aero-Holding Chambers (AEROCHAMBER MV) inhaler, Use as instructed, Disp: 1 each, Rfl: 2 .  TURMERIC PO, Take 2,000 mg by mouth daily. , Disp: , Rfl:  .  zinc gluconate 50 MG tablet, Take 50 mg by mouth daily., Disp: , Rfl:

## 2020-11-21 NOTE — Patient Instructions (Addendum)
Nice to meet you!  Continue all medicines  Will get a heart ultrasound to make sure there is nothing else contributing.   Provided sputum cups - bring sample to next visit or if cough and sputum production worsens in the interim.   Return to clinic in 3 months with Dr. Silas Flood

## 2020-12-03 ENCOUNTER — Encounter (HOSPITAL_COMMUNITY): Payer: Self-pay

## 2020-12-10 ENCOUNTER — Other Ambulatory Visit (HOSPITAL_COMMUNITY): Payer: Medicare HMO

## 2020-12-10 ENCOUNTER — Telehealth: Payer: Self-pay | Admitting: Pulmonary Disease

## 2020-12-10 NOTE — Telephone Encounter (Signed)
Called and spoke with pt and she stated that her daughter tested positive for covid today.  She was around her daughter over the weekend and they have both been fully vaccinated.  I advised her to call the facility that is doing her echo for tomorrow to see if they want her to reschedule.  Pt has the number and will call to check with them,.

## 2020-12-11 ENCOUNTER — Other Ambulatory Visit (HOSPITAL_COMMUNITY): Payer: Medicare HMO

## 2020-12-11 ENCOUNTER — Telehealth: Payer: Self-pay

## 2020-12-11 NOTE — Telephone Encounter (Signed)
Spoke with patient and verbalized she will need to pick up medication on Monday. Office is not able to mail out medicine.

## 2020-12-11 NOTE — Telephone Encounter (Signed)
Thank you :)

## 2020-12-11 NOTE — Telephone Encounter (Signed)
I would prefer her just come in Monday to get it.

## 2020-12-11 NOTE — Telephone Encounter (Signed)
Patient called office today stating her daughter tested positive for COVID and is not able to come in office to pick up clofazimine. Patient only has enough medication to last her through Monday.  Would like to know if office could mail medication. Spoke with Cassie, Pharmacist who is not sure if office can mail medication, patient requesting office confirm. Would like a call today to know if she needs to come in Monday.  Lorenso Courier, [place]

## 2020-12-19 NOTE — Telephone Encounter (Signed)
Patient picked up 2 bottles (~3 month's supply) of clofazimine on 12/16/20.

## 2020-12-20 ENCOUNTER — Telehealth: Payer: Self-pay

## 2020-12-20 NOTE — Telephone Encounter (Signed)
[facility] Patient Advocate Encounter   Received notification from [facility] that prior authorization for [facility] is required.   PA submitted on 12/20/20 Key [id number] Status is pending    [facility] will continue to follow.  When Approved call [facility] @ [place], [place] Patient [facility] for Infectious Disease Phone: [phone] Fax:  [phone]

## 2020-12-20 NOTE — Telephone Encounter (Signed)
[facility] Patient Advocate Encounter  Prior Authorization for Kiara Medina has been approved.    PA# [id number] Effective dates: 12/20/20 through 12/13/21   [facility] will continue to follow.  Ileene Patrick, [facility] Patient [facility] for Infectious Disease Phone: [phone] Fax:  [phone]

## 2020-12-26 ENCOUNTER — Encounter (HOSPITAL_COMMUNITY): Payer: Self-pay

## 2020-12-30 ENCOUNTER — Other Ambulatory Visit (HOSPITAL_COMMUNITY): Payer: Medicare HMO

## 2021-01-14 ENCOUNTER — Other Ambulatory Visit: Payer: Self-pay

## 2021-01-14 ENCOUNTER — Ambulatory Visit (HOSPITAL_COMMUNITY): Payer: Medicare HMO | Attending: Internal Medicine

## 2021-01-14 DIAGNOSIS — R0609 Other forms of dyspnea: Secondary | ICD-10-CM

## 2021-01-14 DIAGNOSIS — R0600 Dyspnea, unspecified: Secondary | ICD-10-CM

## 2021-01-14 LAB — ECHOCARDIOGRAM COMPLETE
Area-P 1/2: 2.5 cm2
S' Lateral: 2.2 cm

## 2021-01-16 ENCOUNTER — Encounter: Payer: Self-pay | Admitting: *Deleted

## 2021-01-16 NOTE — Progress Notes (Signed)
Heart function looks good which is reassuring!

## 2021-01-17 ENCOUNTER — Telehealth: Payer: Self-pay | Admitting: Pulmonary Disease

## 2021-01-21 ENCOUNTER — Encounter: Payer: Self-pay | Admitting: Internal Medicine

## 2021-01-21 ENCOUNTER — Other Ambulatory Visit: Payer: Self-pay | Admitting: Internal Medicine

## 2021-01-21 ENCOUNTER — Ambulatory Visit: Payer: Medicare HMO | Admitting: Internal Medicine

## 2021-01-21 ENCOUNTER — Telehealth: Payer: Self-pay

## 2021-01-21 ENCOUNTER — Telehealth: Payer: Self-pay | Admitting: Pulmonary Disease

## 2021-01-21 ENCOUNTER — Other Ambulatory Visit: Payer: Self-pay

## 2021-01-21 DIAGNOSIS — A310 Pulmonary mycobacterial infection: Secondary | ICD-10-CM

## 2021-01-21 MED ORDER — ETHAMBUTOL HCL 400 MG PO TABS: ORAL_TABLET | ORAL | 3 refills | Status: DC

## 2021-01-21 MED ORDER — RIFAMPIN 300 MG PO CAPS: 600.0000 mg | ORAL_CAPSULE | Freq: Every day | ORAL | 3 refills | Status: DC

## 2021-01-21 NOTE — Telephone Encounter (Signed)
[facility] Patient Advocate Encounter  I gave 2 bottles of Clofazimine to the patient.   Ileene Patrick, [facility] Patient [facility] for Infectious Disease Phone: [phone] Fax:  [phone]

## 2021-01-21 NOTE — Assessment & Plan Note (Signed)
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  I suspected that her symptoms would be progressing more rapidly if she was not on her current 4 drug regimen.  She prefers to continue her antibiotics for now.  She will get lab work today and follow-up in 3 months.

## 2021-01-21 NOTE — Progress Notes (Signed)
[facility]  Patient Active Problem List   Diagnosis Date Noted  . 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 BRONCHIECTASIS 05/23/2009    Priority: Medium  . Depression 06/27/2020  . Unintentional weight loss 05/17/2019  . Dysphonia 01/11/2018  . Right shoulder pain 01/21/2017  . Encounter for hepatitis C screening test for low risk patient 01/21/2017  . GERD (gastroesophageal reflux disease) 02/21/2016  . 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 Dyslipidemia 11/14/2014  . Allergic rhinitis 05/03/2009    Patient's Medications  New Prescriptions   No medications on file  Previous Medications   ACYCLOVIR (ZOVIRAX) 400 MG TABLET    Take 400 mg by mouth 2 (two) times daily.   ALBUTEROL (PROVENTIL HFA;VENTOLIN HFA) 108 (90 BASE) MCG/ACT INHALER    Inhale 2 puffs into the lungs every 6 (six) hours as needed for wheezing or shortness of breath.   ARIKAYCE 590 MG/8.4ML SUSP    Inhale the contents of one vial (539m) via [place] device 1 time daily as directed.   ASCORBIC ACID (VITAMIN C) 500 MG TABLET    Take 500 mg by mouth daily.   ATORVASTATIN (LIPITOR) 20 MG TABLET    Take 20 mg by mouth daily.   AZELASTINE HCL 137 MCG/SPRAY SOLN       B COMPLEX VITAMINS (B COMPLEX-B12 PO)    Take 1 tablet by mouth daily.   BENZONATATE (TESSALON PERLES) 100 MG CAPSULE    Take 1 capsule (100 mg total) by mouth 3 (three) times daily as needed for cough.   CALCIUM CARBONATE (OSCAL) 1500 (600 CA) MG TABS TABLET    Take 600 mg of elemental calcium by mouth 2 (two) times daily with a meal.   CETIRIZINE (ZYRTEC) 10 MG TABLET    every morning.   CHOLECALCIFEROL (VITAMIN D3) 125 MCG (5000 UT) TABS    Take 1 tablet by mouth.   CLOFAZIMINE PO    Take 2 tablets by mouth daily.   FLAXSEED, LINSEED, (FLAX SEED OIL) 1300 MG CAPS    Take by mouth daily.   GLYCOPYRROLATE-FORMOTEROL (BEVESPI AEROSPHERE) 9-4.8 MCG/ACT AERO    Inhale 2 puffs into the  lungs in the morning and at bedtime.   HYDROCORTISONE (ANUSOL-HC) 2.5 % RECTAL CREAM    Proctozone-HC 2.5 % topical cream perineal applicator   HYDROCORTISONE 2.5 % CREAM    hydrocortisone 2.5 % topical cream  APPLY A THIN LAYER TO THE  AFFECTED AREA THREE TIMES DAILY AS NEEDED   MELATONIN 10 MG CAPS    10 mg nightly as needed.   OMEPRAZOLE (PRILOSEC) 40 MG CAPSULE    TAKE 1 CAPSULE TWICE DAILY   PROBIOTIC PRODUCT (PROBIOTIC DAILY PO)    Take 1 capsule by mouth daily.   RESPIRATORY THERAPY SUPPLIES (FLUTTER) DEVI    Use as directed.   SODIUM CHLORIDE HYPERTONIC 3 % NEBULIZER SOLUTION    INHALE THE CONTENTS OF 1 VIAL ( 4ML ) VIA NEBULIZER TWICE DAILY   SPACER/AERO-HOLDING CHAMBERS (AEROCHAMBER MV) INHALER    Use as instructed   TURMERIC PO    Take 2,000 mg by mouth daily.    ZINC GLUCONATE 50 MG TABLET    Take 50 mg by mouth daily.  Modified Medications   Modified Medication Previous Medication   ETHAMBUTOL (MYAMBUTOL) 400 MG TABLET ethambutol (MYAMBUTOL) 400 MG tablet      TAKE 3 TABLETS EVERY DAY    TAKE 3  TABLETS EVERY DAY   RIFAMPIN (RIFADIN) 300 MG CAPSULE rifampin (RIFADIN) 300 MG capsule      Take 2 capsules (600 mg total) by mouth daily.    Take 2 capsules (600 mg total) by mouth daily.  Discontinued Medications   No medications on file    Subjective: Jacquita is in for her routine follow-up visit.  She is now completed 33 months of therapy for relapsed, antibiotic resistant Mycobacterium avium pneumonia.  She has been receiving oral ethambutol, rifampin and clofazimine along with aerosolized amikacin.  Her PCP recently treated her for bronchitis.  She received ciprofloxacin and prednisone and was followed by oxacillin.  She still has a lot of sinus congestion.  She had some hemoptysis yesterday.  She is feeling more short of breath she says she is not having any significant problems tolerating her 3 oral antibiotics for aerosolized amikacin.   She had a chest CT scan on 04/10/2020 which  showed:  IMPRESSION: 1. Progressive peribronchovascular nodularity/nodular consolidation with associated bronchiectasis, findings likely due to worsening mycobacterium avium complex. Findings are suggestive of an alternative diagnosis (not UIP) per consensus guidelines: Diagnosis of Idiopathic Pulmonary Fibrosis: An Official ATS/ERS/JRS/ALAT Clinical Practice Guideline. [place], Iss 5, [id number], Aug 14 2017. 2. Cavitary nodule in the right upper lobe is likely related to MAC. Difficult to definitively exclude malignancy. Consider follow-up CT chest without contrast in 3 months, as clinically indicated. 3. Basilar predominant ill-defined centrilobular nodularity can be seen with an infectious bronchiolitis or possibly subacute hypersensitivity pneumonitis. 4. Aortic atherosclerosis (ICD10-I70.0). Coronary artery calcification.  Her husband died recently after a short bout with pancreatic cancer.  She has noted a slightly worsening shortness of breath and some intermittent, worsening coughing spells.  She had one episode of hemoptysis in January.  She notes that her appetite is not as good as normal.  Review of Systems: Review of Systems  Constitutional: Positive for malaise/fatigue and weight loss. Negative for chills, diaphoresis and fever.  HENT: Positive for hearing loss and tinnitus.        She has had no change in her hearing loss or tinnitus since starting on amikacin and her audiograms have not revealed any acute changes.  Respiratory: Positive for cough, hemoptysis, sputum production and shortness of breath. Negative for wheezing.   Cardiovascular: Negative for chest pain.  Gastrointestinal: Positive for heartburn. Negative for abdominal pain, diarrhea, nausea and vomiting.  Musculoskeletal: Positive for joint pain.  Psychiatric/Behavioral: Negative for depression.    Past Medical History:  Diagnosis Date  . Allergic rhinitis, cause unspecified   .  Bronchiectasis without acute exacerbation ([place])   . Cough   . Sinusitis     Social History   Tobacco Use  . Smoking status: Former Smoker    Packs/day: 1.00    Years: 20.00    Pack years: 20.00    Types: Cigarettes    Quit date: 12/14/1985    Years since quitting: 35.1  . Smokeless tobacco: Never Used  Vaping Use  . Vaping Use: Never used  Substance Use Topics  . Alcohol use: Yes    Alcohol/week: 1.0 standard drink    Types: 1 Glasses of wine per week    Comment: occ  . Drug use: No    Family History  Problem Relation Age of Onset  . Emphysema Sister   . Stroke Mother   . Clotting disorder Mother     No Known Allergies  Objective: Vitals:  01/21/21 1410  BP: 116/74  Pulse: 81  Temp: 97.6 F (36.4 C)  TempSrc: Oral  SpO2: 99%  Weight: 113 lb (51.3 kg)  Height: _0  (1.626 m)   Body mass index is 19.4 kg/m.  Physical Exam Constitutional:      Comments: Her weight is down 15 pounds over the past year.  Eyes:     Conjunctiva/sclera: Conjunctivae normal.  Cardiovascular:     Rate and Rhythm: Normal rate and regular rhythm.     Heart sounds: No murmur heard.   Pulmonary:     Effort: Pulmonary effort is normal.     Breath sounds: Normal breath sounds. No wheezing, rhonchi or rales.     Comments: She has a few crackles in her bases posteriorly, right greater than left.   Abdominal:     Palpations: Abdomen is soft.     Tenderness: There is no abdominal tenderness.  Skin:    Findings: No rash.  Neurological:     General: No focal deficit present.  Psychiatric:        Mood and Affect: Mood normal.     Lab Results CMP     Component Value Date/Time   NA 139 06/27/2020 1614   K 4.2 06/27/2020 1614   CL 104 06/27/2020 1614   CO2 27 06/27/2020 1614   GLUCOSE 87 06/27/2020 1614   BUN 13 06/27/2020 1614   CREATININE 0.91 06/27/2020 1614   CALCIUM 9.0 06/27/2020 1614   PROT 5.9 (L) 06/27/2020 1614   ALBUMIN 3.8 04/23/2015 1057   AST 24 06/27/2020  1614   ALT 19 06/27/2020 1614   ALKPHOS 55 04/23/2015 1057   BILITOT 0.4 06/27/2020 1614   GFRNONAA >60 06/01/2011 1457   GFRAA >60 06/01/2011 1457   Lab Results  Component Value Date   WBC 6.1 06/27/2020   HGB 13.3 06/27/2020   HCT 40.2 06/27/2020   MCV 96.4 06/27/2020   PLT 169 06/27/2020   Sputum AFB culture 05/17/2019; + for Mycobacterium avium again  Chest x-ray 05/17/2019 IMPRESSION: Stable chronic lung disease.  No acute cardiopulmonary abnormality.    By: Genevie Ann M.D.   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  I suspected that her symptoms would be progressing more rapidly if she was not on her current 4 drug regimen.  She prefers to continue her antibiotics for now.  She will get lab work today and follow-up in 3 months.      Relevant Medications   ethambutol (MYAMBUTOL) 400 MG tablet   rifampin (RIFADIN) 300 MG capsule   Other Relevant Orders   CBC   Comprehensive metabolic panel       Michel Bickers, MD [facility] for [facility] [place] [phone] pager   [phone] cell 01/21/2021, 2:42 PM

## 2021-01-21 NOTE — Telephone Encounter (Signed)
Nothing noted in message. Will close encounter.  

## 2021-01-21 NOTE — Telephone Encounter (Signed)
Called patient at preferred number. Straight to voicemail, no answer.

## 2021-01-21 NOTE — Telephone Encounter (Signed)
Dr. Silas Flood please advise. thanks

## 2021-01-22 LAB — CBC
HCT: 42.1 % (ref 35.0–45.0)
Hemoglobin: 14 g/dL (ref 11.7–15.5)
MCH: 32.6 pg (ref 27.0–33.0)
MCHC: 33.3 g/dL (ref 32.0–36.0)
MCV: 98.1 fL (ref 80.0–100.0)
MPV: 11.4 fL (ref 7.5–12.5)
Platelets: 181 10*3/uL (ref 140–400)
RBC: 4.29 10*6/uL (ref 3.80–5.10)
RDW: 13.1 % (ref 11.0–15.0)
WBC: 6.8 10*3/uL (ref 3.8–10.8)

## 2021-01-22 LAB — COMPREHENSIVE METABOLIC PANEL
AG Ratio: 1.5 (calc) (ref 1.0–2.5)
ALT: 22 U/L (ref 6–29)
AST: 23 U/L (ref 10–35)
Albumin: 3.5 g/dL — ABNORMAL LOW (ref 3.6–5.1)
Alkaline phosphatase (APISO): 73 U/L (ref 37–153)
BUN: 14 mg/dL (ref 7–25)
CO2: 30 mmol/L (ref 20–32)
Calcium: 9.1 mg/dL (ref 8.6–10.4)
Chloride: 105 mmol/L (ref 98–110)
Creat: 0.73 mg/dL (ref 0.60–0.93)
Globulin: 2.4 g/dL (calc) (ref 1.9–3.7)
Glucose, Bld: 86 mg/dL (ref 65–99)
Potassium: 4.3 mmol/L (ref 3.5–5.3)
Sodium: 141 mmol/L (ref 135–146)
Total Bilirubin: 0.4 mg/dL (ref 0.2–1.2)
Total Protein: 5.9 g/dL — ABNORMAL LOW (ref 6.1–8.1)

## 2021-01-28 ENCOUNTER — Telehealth: Payer: Self-pay

## 2021-01-28 ENCOUNTER — Other Ambulatory Visit: Payer: Self-pay | Admitting: Internal Medicine

## 2021-01-28 DIAGNOSIS — A310 Pulmonary mycobacterial infection: Secondary | ICD-10-CM

## 2021-01-28 NOTE — Telephone Encounter (Signed)
Received refill request for Arikayce. Nothing mentioned in last office visit to continue medication. Routing to provider for approval.

## 2021-01-28 NOTE — Telephone Encounter (Signed)
lmtcb for pt.  

## 2021-01-28 NOTE — Telephone Encounter (Signed)
Patient is returning phone call. Patient phone number is [phone]. Needs call to come from our office.

## 2021-01-28 NOTE — Telephone Encounter (Signed)
Pt called back and she is aware of results.  Nothing further is needed.  She has appt next month.

## 2021-01-28 NOTE — Telephone Encounter (Signed)
Lanier Clam, MD  01/16/2021 10:04 AM EST      Heart function looks good which is reassuring!   Results of pt's recent Echo are shown above.   Attempted to call pt but unable to reach. Left message for her to return call.

## 2021-01-29 ENCOUNTER — Other Ambulatory Visit: Payer: Self-pay | Admitting: Internal Medicine

## 2021-01-29 DIAGNOSIS — A310 Pulmonary mycobacterial infection: Secondary | ICD-10-CM

## 2021-01-29 MED ORDER — ARIKAYCE 590 MG/8.4ML IN SUSP: RESPIRATORY_TRACT | 28 refills | Status: DC

## 2021-01-29 NOTE — Telephone Encounter (Signed)
Please refill Arikayce.

## 2021-02-04 DIAGNOSIS — E785 Hyperlipidemia, unspecified: Secondary | ICD-10-CM | POA: Diagnosis not present

## 2021-02-04 DIAGNOSIS — Z9181 History of falling: Secondary | ICD-10-CM | POA: Diagnosis not present

## 2021-02-04 DIAGNOSIS — Z1331 Encounter for screening for depression: Secondary | ICD-10-CM | POA: Diagnosis not present

## 2021-02-04 DIAGNOSIS — Z0000 Encounter for general adult medical examination without abnormal findings: Secondary | ICD-10-CM | POA: Diagnosis not present

## 2021-02-20 ENCOUNTER — Other Ambulatory Visit: Payer: Self-pay

## 2021-02-20 ENCOUNTER — Encounter: Payer: Self-pay | Admitting: Pulmonary Disease

## 2021-02-20 ENCOUNTER — Ambulatory Visit: Payer: Medicare HMO | Admitting: Pulmonary Disease

## 2021-02-20 VITALS — BP 116/72 | HR 78 | Temp 97.9°F | Ht 64.0 in | Wt 112.0 lb

## 2021-02-20 DIAGNOSIS — J479 Bronchiectasis, uncomplicated: Secondary | ICD-10-CM

## 2021-02-20 MED ORDER — TRELEGY ELLIPTA 100-62.5-25 MCG/INH IN AEPB
1.0000 | INHALATION_SPRAY | Freq: Every day | RESPIRATORY_TRACT | 0 refills | Status: DC
Start: 2021-02-20 — End: 2021-03-17

## 2021-02-20 NOTE — Patient Instructions (Addendum)
Nice to see you again  Try using Trelegy 1 puff daily to see if the addition of the inhaled steroid helps your breathing.  You can stop the Bevespi while you are using this.  If it is not very helpful, please let me know and return to using the [facility] as prescribed.  If this change is very helpful, I can send in a new prescription.  I will send your sputum to the lab today.  Return to clinic in 3 months with Dr. Silas Flood for follow-up.

## 2021-02-20 NOTE — Progress Notes (Signed)
Synopsis: Referred in 2010 for bronchiectasis by Nicoletta Dress, MD.  Previously patient of Dr. Normajean Baxter and Dr. Lake Bells and Dr. Carlis Abbott.  Subjective:   PATIENT ID: Kiara Medina GENDER: female DOB: May 25, 1946, MRN: [id number]  Chief Complaint  Patient presents with   Follow-up    3 month f/u. States she has been stable since last visit. Brought a sputum sample today.    Kiara Medina is a 75 y/o woman with a history of MDR MAC and bronchiectasis who presents for follow up. She has been on 4 drug suppressive therapy for 3.5 years with persistently positive cultures (clofazamine, ethambutol, rifampin, and inhaled amikacin). Recent office visit with ID on 10/22/2020 - note reviewed. Continues with weight loss, down 7 pounds since last office visit. Now supplementing with 1 boost in evening. Notes ongoing DOE. Hs good days and bad.  Notes significant decrease in activity during pandemic..    02/20/2021 Kiara Medina returns to clinic for routine follow-up.  Ongoing cough is stable.  Ongoing dyspnea exertion is stable.  She would like to do more, be more active.  She feels held back by her dyspnea.  She is using Bevespi as prescribed but unsure if it is helping.  Describes a couple of episodes of what sounds like bronchospasm versus vocal cord dysfunction.  Acute onset of difficulty breathing, feels like cannot get any air in.  Once calm down some does use albuterol with relief eventually.  She has questions about other resources, treatment centers for NTM.  Told her I would reach out to my colleagues to see if any trials, etc. are available for MDR MAI.   Most recent infectious disease note reviewed.  Reviewed serial cultures, no recent bacterial cultures of note.  09/2020 Kiara Medina is a 75 y/o woman with a history of MDR MAC and bronchiectasis who presents for follow up. She has been on 4 drug suppressive therapy for 3.5 years with persistently positive cultures (clofazamine, ethambutol, rifampin, and  inhaled amikacin). Recent office visit with ID on 08/20/2020 - note reviewed. She has continued to have steady weight loss, about another pound since her last visit. She drinks boost 1 can per day, but struggles to drink more than this. She eats protein bars for breakfast and tries to keep snacks around given her reduced appetite.  She had recent exacerbation which was treated by her PCP with a dose of steroids and antibiotics.  Her symptoms resolved.  Over the weekend she developed a viral URI with sneezing, rhinorrhea, sore throat, but no fevers or change in sputum.  She has been taking Mucinex to help with this. She continues on Bevespi and is using her hypertonic saline twice daily with her flutter valve.   08/13/20: Kiara Medina is a 75 y/o woman with a history of MDR-resistant MAC and bronchiectasis who presents for follow up. She has stopped taking Stiolto due to feeling like her throat was clsoing up. Bevespi has been prescribed as an alternative, but has not yet arrived from her pharmacy. She stopped stiolto about 6 weeks ago with worsening of her pulmonary symptoms in the interim- she has worse DOE than her baseline and has noticed increased cough and sputum production. She coughs to the point of dry-heaving at times. She continues on her 4-drug MAC regimen (clofazamine, ethambutol, rifampin, and inhaled amikacin) and hypertonic saline. She has noticed worse appetite and 10# weight loss recently. She has been under stress due to her husband having dementia and her feeling poorly.  She has tried taking boost supplements again. She followed up with Dr. Megan Salon from ID on 06/27/20 for MAC-- note reviewed.    OV 04/24/20: Kiara Medina is a 75 y/o woman with a history of MAI infection and bronchiectasis who presents for follow up. At her last visit she was started on Stiolto for progressively worsening DOE. She has noticed a significant improvement in shortness of breath.  She is able to walk further than she  used to be able to.  She has no wheezing, sputum production.  She has noticed that her mouth is more dry, which she manages with drinking water frequently.   OV 03/27/20: Kiara Medina is a 75 year old woman with a history of bronchiectasis and persistent MAI with multiple drug resistance.  She follows with Dr. Megan Salon in infectious disease-recent note from 02/21/2020 reviewed.  She is feeling worse than her last visit.  She is worse dyspnea on exertion, increased coughing and sputum production.  She notices more wheezing, especially at night.  She is short of breath with usual activity, just walking to her mailbox and back.  Even sitting down when she is gardening she notices more shortness of breath.  She has saturations at she is seen as low as 89%, but they are usually 90 to 92% and improved with rest.  She is using her hypertonic saline nebs once daily.  She continues on her current NTM regimen including inhaled amikacin.  The past few months she has lost about 2 pounds. She has less energy than normal.  No fevers or chills.  OV 12/28/19: Kiara Medina is a 75 y/o woman being seen in follow up of chronic bronchiectasis and MAC. She was diagnosed with MAC in 2015, and was originally treated with 3-drug RAE therapy 3 days per week for about a year with negative cultures. She remained off therapy until~2 years ago when she had a recurrence. She is now on 4-drug daily therapy (ethambutol, rifampin, clofazamine, inhaled liposomal amikacin) with a MDR strain of MAC. She has persistently positive cultures. She follows with Dr. Megan Salon in [place]. She has had worse SOB for about 6 months, but attributes this to being at home more and going out less. She has more trouble walking around without getting SOB. She has more trouble breathing at the temperature extremes. She denies wheezing. She has significant sputum production, ranging from white to yellow, sometimes with steaks of blood. Her appetite is poor and she has  continued to slowly lose weight over time. She has not been drinking Ensure due to disliking the taste/ texture, but she has been trying to get protein in her diet. She no longer has night sweats, denies fevers, and she has no issues sleeping. She uses her hypertonic saline nebs once daily before using her inhaled amikacin. She uses her flutter valve PRN, mostly when she feels that she is having trouble clearing her mucus.  She is scheduled to receive her covid vaccine next week [facility]). Former smoker-quit 1987, 25-pack-year history   Past Medical History:  Diagnosis Date   Allergic rhinitis, cause unspecified    Bronchiectasis without acute exacerbation (HCC)    Cough    Sinusitis      Family History  Problem Relation Age of Onset   Emphysema Sister    Stroke Mother    Clotting disorder Mother      Past Surgical History:  Procedure Laterality Date   APPENDECTOMY     CATARACT EXTRACTION  08/2013   CHOLECYSTECTOMY  TUBAL LIGATION     VESICOVAGINAL FISTULA CLOSURE W/ TAH      Social History   Socioeconomic History   Marital status: Widowed    Spouse name: Not on file   Number of children: Not on file   Years of education: Not on file   Highest education level: Not on file  Occupational History   Occupation: realtor  Tobacco Use   Smoking status: Former Smoker    Packs/day: 1.00    Years: 20.00    Pack years: 20.00    Types: Cigarettes    Quit date: 12/14/1985    Years since quitting: 35.2   Smokeless tobacco: Never Used  Vaping Use   Vaping Use: Never used  Substance and Sexual Activity   Alcohol use: Yes    Alcohol/week: 1.0 standard drink    Types: 1 Glasses of wine per week    Comment: occ   Drug use: No   Sexual activity: Not on file  Other Topics Concern   Not on file  Social History Narrative   Not on file   Social Determinants of Health   Financial Resource Strain: Not on file  Food Insecurity: Not on file   Transportation Needs: Not on file  Physical Activity: Not on file  Stress: Not on file  Social Connections: Not on file  Intimate Partner Violence: Not on file     No Known Allergies   Immunization History  Administered Date(s) Administered   Fluad Quad(high Dose 65+) 09/24/2020   Influenza Split 09/13/2012, 09/06/2014   Influenza Whole 09/09/2009, 08/14/2010, 09/14/2011   Influenza, High Dose Seasonal PF 10/10/2015   Influenza,inj,Quad PF,6+ Mos 09/13/2013, 09/21/2016, 08/26/2017, 08/22/2018   Influenza-Unspecified 08/29/2019   Moderna Sars-Covid-2 Vaccination 01/03/2020, 01/31/2020, 08/02/2020   Pneumococcal Conjugate-13 11/24/2014   Pneumococcal Polysaccharide-23 07/17/2009    Outpatient Medications Prior to Visit  Medication Sig Dispense Refill   acyclovir (ZOVIRAX) 400 MG tablet Take 400 mg by mouth 2 (two) times daily.     albuterol (PROVENTIL HFA;VENTOLIN HFA) 108 (90 Base) MCG/ACT inhaler Inhale 2 puffs into the lungs every 6 (six) hours as needed for wheezing or shortness of breath. 1 Inhaler 0   Amikacin Sulfate Liposome (ARIKAYCE) 590 MG/8.4ML SUSP Inhale the contents of one vial (590 mg) via Lamira device 1 time daily as directed. 235.2 mL 28   ascorbic acid (VITAMIN C) 500 MG tablet Take 500 mg by mouth daily.     atorvastatin (LIPITOR) 20 MG tablet Take 20 mg by mouth daily.     Azelastine HCl 137 MCG/SPRAY SOLN      B Complex Vitamins (B COMPLEX-B12 PO) Take 1 tablet by mouth daily.     benzonatate (TESSALON PERLES) 100 MG capsule Take 1 capsule (100 mg total) by mouth 3 (three) times daily as needed for cough. 90 capsule 5   calcium carbonate (OSCAL) 1500 (600 Ca) MG TABS tablet Take 600 mg of elemental calcium by mouth 2 (two) times daily with a meal.     cetirizine (ZYRTEC) 10 MG tablet every morning.     Cholecalciferol (VITAMIN D3) 125 MCG (5000 UT) TABS Take 1 tablet by mouth.     CLOFAZIMINE PO Take 2 tablets by mouth daily.      ethambutol (MYAMBUTOL) 400 MG tablet TAKE 3 TABLETS EVERY DAY 270 tablet 3   Flaxseed, Linseed, (FLAX SEED OIL) 1300 MG CAPS Take by mouth daily.     Glycopyrrolate-Formoterol (BEVESPI AEROSPHERE) 9-4.8 MCG/ACT AERO Inhale 2 puffs into the  lungs in the morning and at bedtime. 32.1 g 4   hydrocortisone (ANUSOL-HC) 2.5 % rectal cream Proctozone-HC 2.5 % topical cream perineal applicator     hydrocortisone 2.5 % cream hydrocortisone 2.5 % topical cream  APPLY A THIN LAYER TO THE  AFFECTED AREA THREE TIMES DAILY AS NEEDED     Melatonin 10 MG CAPS 10 mg nightly as needed.     omeprazole (PRILOSEC) 40 MG capsule TAKE 1 CAPSULE TWICE DAILY 180 capsule 1   Probiotic Product (PROBIOTIC DAILY PO) Take 1 capsule by mouth daily.     Respiratory Therapy Supplies (FLUTTER) DEVI Use as directed. 1 each 0   rifampin (RIFADIN) 300 MG capsule Take 2 capsules (600 mg total) by mouth daily. 180 capsule 3   sodium chloride HYPERTONIC 3 % nebulizer solution INHALE THE CONTENTS OF 1 VIAL ( 4ML ) VIA NEBULIZER TWICE DAILY 720 mL 3   Spacer/Aero-Holding Chambers (AEROCHAMBER MV) inhaler Use as instructed 1 each 2   TURMERIC PO Take 2,000 mg by mouth daily.      zinc gluconate 50 MG tablet Take 50 mg by mouth daily.     No facility-administered medications prior to visit.    Review of Systems  No chest pain.  No hemoptysis.  No worsening sputum production or change in quality of sputum.   Objective:   Vitals:   02/20/21 1328  BP: 116/72  Pulse: 78  Temp: 97.9 F (36.6 C)  TempSrc: Temporal  SpO2: 98%  Weight: 112 lb (50.8 kg)  Height: _0  (1.626 m)   98% on   RA BMI Readings from Last 3 Encounters:  02/20/21 19.22 kg/m  01/21/21 19.40 kg/m  11/21/20 19.98 kg/m   Wt Readings from Last 3 Encounters:  02/20/21 112 lb (50.8 kg)  01/21/21 113 lb (51.3 kg)  11/21/20 114 lb 9.6 oz (52 kg)    Physical Exam BP 116/72    Pulse 78    Temp 97.9 F (36.6 C) (Temporal)    Ht _1  (1.626  m)    Wt 112 lb (50.8 kg)    SpO2 98% Comment: on RA   BMI 19.22 kg/m  General: Well-appearing in no acute distress, thin Eyes: EOMI, icterus Respiratory: Mild crackles in the bases, otherwise clear to auscultation Cardiovascular: Regular rhythm, no murmur Extremities: Warm, no edema     CBC    Component Value Date/Time   WBC 6.8 01/21/2021 1440   RBC 4.29 01/21/2021 1440   HGB 14.0 01/21/2021 1440   HCT 42.1 01/21/2021 1440   PLT 181 01/21/2021 1440   MCV 98.1 01/21/2021 1440   MCH 32.6 01/21/2021 1440   MCHC 33.3 01/21/2021 1440   RDW 13.1 01/21/2021 1440   LYMPHSABS 1,929 01/10/2019 1657   EOSABS 289 01/10/2019 1657   BASOSABS 18 01/10/2019 1657    CHEMISTRY No results for input(s): NA, K, CL, CO2, GLUCOSE, BUN, CREATININE, CALCIUM, MG, PHOS in the last 168 hours. CrCl cannot be calculated (Patient's most recent lab result is older than the maximum 21 days allowed.).  12/28/2019: IgG 954 IgM 96 IgA 289 IgE 10 IgG subclasses normal (other than increased IgG subclass 3)  Cultures: 3/10322 - pending 11/23/2019-MAI 05/17/2019-MAI 03/15/2019-MAI 01/10/2019 MAI 11/07/2018-MAI 10/10/2018-MAI  08/22/2018-MAI 07/11/2018-MAI 05/31/2018-MAI MAI susceptibilities-resistant clarithromycin, linezolid.  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  Susceptible to amikacin, cefepime, clarithromycin, imipenem, linezolid, trimethoprim sulfamethoxazole.  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  Tree-in-bud opacities diffusely.  Mucus impaction in airways.   Pulmonary  Functions Testing Results: PFT Results Latest Ref Rng & Units 04/18/2018  FVC-Pre L 2.08  FVC-Predicted Pre % 79  Pre FEV1/FVC % % 75  FEV1-Pre L 1.57  FEV1-Predicted Pre % 80   2019-no significant obstruction, mildly reduced FVC.     Assessment & Plan:     ICD-10-CM   1. BRONCHIECTASIS  J47.9 Respiratory or Resp and Sputum Culture    Dyspnea on exertion likely due to chronic bronchiectasis-improved on LAMA/LABA; worsed when back off these meds. Component of deconditioning.  Has seasonal allergies, describes bronchospasm, possible asthma. -Escalate Bevespi to triple therapy via Trelegy trial, if improves can provide prescription, if not to return to [place] using albuterol as needed -Continue airway clearance therapy with hypertonic saline twice daily and flutter valve --TTE to eval cardiac causes if present --Disucssed pulmonary rehab, strongly advise if TTE unrevealing.  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  No obvious immunodeficiencies.  Progressive on CT over the last 6 years.  No evidence of hypersensitivity pneumonitis from inhaled amikacin on CT scan. -Continue hypertonic saline nebs with flutter valve twice daily for airway clearance therapy regimen. -Continue current 4 drug MAI therapy and ID follow-up.  I agree with Dr. Megan Salon that she has limited treatment options and would likely progress faster off meds. My concern is her weight loss could be related to MAC rather than the meds. -Up-to-date on Covid, pneumonia. Flu vaccine today. --obtain AFB, LRCx at next visit - cups provided  Weight loss- concerned this may be a MAC secondary effect, complicated by fatigue and DOE.  Related to metabolic needs from dyspnea, cough. -Con't MAC 4-drug therapy for now as these meds to not have an obvious precipitating effect -Con't snacking, continue boost (breakfast, 2 at dinner)  RTC in 3 months with Dr. Silas Flood.     Current Outpatient  Medications:    acyclovir (ZOVIRAX) 400 MG tablet, Take 400 mg by mouth 2 (two) times daily., Disp: , Rfl:    albuterol (PROVENTIL HFA;VENTOLIN HFA) 108 (90 Base) MCG/ACT inhaler, Inhale 2 puffs into the lungs every 6 (six) hours as needed for wheezing or shortness of breath., Disp: 1 Inhaler, Rfl: 0   Amikacin Sulfate Liposome (ARIKAYCE) 590 MG/8.4ML SUSP, Inhale the contents of one vial (590 mg) via Lamira device 1 time daily as directed., Disp: 235.2 mL, Rfl: 28   ascorbic acid (VITAMIN C) 500 MG tablet, Take 500 mg by mouth daily., Disp: , Rfl:    atorvastatin (LIPITOR) 20 MG tablet, Take 20 mg by mouth daily., Disp: , Rfl:    Azelastine HCl 137 MCG/SPRAY SOLN, , Disp: , Rfl:    B Complex Vitamins (B COMPLEX-B12 PO), Take 1 tablet by mouth daily., Disp: , Rfl:    benzonatate (TESSALON PERLES) 100 MG capsule, Take 1 capsule (100 mg total) by mouth 3 (three) times daily as needed for cough., Disp: 90 capsule, Rfl: 5   calcium carbonate (OSCAL) 1500 (600 Ca) MG TABS tablet, Take 600 mg of elemental calcium by mouth 2 (two) times daily with a meal., Disp: , Rfl:    cetirizine (ZYRTEC) 10 MG tablet, every morning., Disp: , Rfl:    Cholecalciferol (VITAMIN D3) 125 MCG (5000 UT) TABS, Take 1 tablet by mouth., Disp: ,  Rfl:    CLOFAZIMINE PO, Take 2 tablets by mouth daily., Disp: , Rfl:    ethambutol (MYAMBUTOL) 400 MG tablet, TAKE 3 TABLETS EVERY DAY, Disp: 270 tablet, Rfl: 3   Flaxseed, Linseed, (FLAX SEED OIL) 1300 MG CAPS, Take by mouth daily., Disp: , Rfl:    Glycopyrrolate-Formoterol (BEVESPI AEROSPHERE) 9-4.8 MCG/ACT AERO, Inhale 2 puffs into the lungs in the morning and at bedtime., Disp: 32.1 g, Rfl: 4   hydrocortisone (ANUSOL-HC) 2.5 % rectal cream, Proctozone-HC 2.5 % topical cream perineal applicator, Disp: , Rfl:    hydrocortisone 2.5 % cream, hydrocortisone 2.5 % topical cream  APPLY A THIN LAYER TO THE  AFFECTED AREA THREE TIMES DAILY AS NEEDED, Disp: , Rfl:    Melatonin  10 MG CAPS, 10 mg nightly as needed., Disp: , Rfl:    omeprazole (PRILOSEC) 40 MG capsule, TAKE 1 CAPSULE TWICE DAILY, Disp: 180 capsule, Rfl: 1   Probiotic Product (PROBIOTIC DAILY PO), Take 1 capsule by mouth daily., Disp: , Rfl:    Respiratory Therapy Supplies (FLUTTER) DEVI, Use as directed., Disp: 1 each, Rfl: 0   rifampin (RIFADIN) 300 MG capsule, Take 2 capsules (600 mg total) by mouth daily., Disp: 180 capsule, Rfl: 3   sodium chloride HYPERTONIC 3 % nebulizer solution, INHALE THE CONTENTS OF 1 VIAL ( 4ML ) VIA NEBULIZER TWICE DAILY, Disp: 720 mL, Rfl: 3   Spacer/Aero-Holding Chambers (AEROCHAMBER MV) inhaler, Use as instructed, Disp: 1 each, Rfl: 2   TURMERIC PO, Take 2,000 mg by mouth daily. , Disp: , Rfl:    zinc gluconate 50 MG tablet, Take 50 mg by mouth daily., Disp: , Rfl:

## 2021-02-20 NOTE — Addendum Note (Signed)
Addended by: Valerie Salts on: 02/20/2021 04:37 PM   Modules accepted: Orders

## 2021-03-17 ENCOUNTER — Telehealth: Payer: Self-pay | Admitting: Pulmonary Disease

## 2021-03-17 MED ORDER — TRELEGY ELLIPTA 100-62.5-25 MCG/INH IN AEPB
1.0000 | INHALATION_SPRAY | Freq: Every day | RESPIRATORY_TRACT | 5 refills | Status: DC
Start: 2021-03-17 — End: 2022-07-23

## 2021-03-17 NOTE — Telephone Encounter (Signed)
Called and spoke with pt who states she was given samples of Trelegy at last OV. States that it has been working well for her and that she does want to have Rx for a 1 month supply with additional refills to be sent to local pharmacy instead of having Rx sent to mail order pharmacy.  Verified preferred pharmacy and sent Rx in for pt. Nothing further needed.

## 2021-03-20 LAB — AFB ID BY DNA PROBE
M avium complex: POSITIVE — AB
M tuberculosis complex: NEGATIVE

## 2021-03-20 LAB — AFB CULTURE WITH SMEAR (NOT AT ARMC)
Acid Fast Culture: POSITIVE — AB
Acid Fast Smear: NEGATIVE

## 2021-03-28 ENCOUNTER — Other Ambulatory Visit: Payer: Self-pay | Admitting: Critical Care Medicine

## 2021-04-10 ENCOUNTER — Telehealth: Payer: Self-pay | Admitting: Pulmonary Disease

## 2021-04-10 MED ORDER — AMOXICILLIN-POT CLAVULANATE 875-125 MG PO TABS
1.0000 | ORAL_TABLET | Freq: Two times a day (BID) | ORAL | 0 refills | Status: AC
Start: 2021-04-10 — End: 2021-04-24

## 2021-04-10 NOTE — Telephone Encounter (Signed)
Primary Pulmonologist: Hunsucker Last office visit and with whom: 02/20/21  Hunsucker What do we see them for (pulmonary problems): Bronchiectasis,  Last OV assessment/plan:  Assessment & Plan:     ICD-10-CM   1. BRONCHIECTASIS  J47.9 Respiratory or Resp and Sputum Culture    Dyspnea on exertion likely due to chronic bronchiectasis-improved on LAMA/LABA; worsed when back off these meds. Component of deconditioning.  Has seasonal allergies, describes bronchospasm, possible asthma. -Escalate Bevespi to triple therapy via Trelegy trial, if improves can provide prescription, if not to return to [place] using albuterol as needed -Continue airway clearance therapy with hypertonic saline twice daily and flutter valve --TTE to eval cardiac causes if present --Disucssed pulmonary rehab, strongly advise if TTE unrevealing.  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  No obvious immunodeficiencies.  Progressive on CT over the last 6 years.  No evidence of hypersensitivity pneumonitis from inhaled amikacin on CT scan. -Continue hypertonic saline nebs with flutter valve twice daily for airway clearance therapy regimen. -Continue current 4 drug MAI therapy and ID follow-up.  I agree with Dr. Megan Salon that she has limited treatment options and would likely progress faster off meds. My concern is her weight loss could be related to MAC rather than the meds. -Up-to-date on Covid, pneumonia. Flu vaccine today. --obtain AFB, LRCx at next visit - cups provided  Weight loss- concerned this may be a MAC secondary effect, complicated by fatigue and DOE.  Related to metabolic needs from dyspnea, cough. -Con't MAC 4-drug therapy for now as these meds to not have an obvious precipitating effect -Con't snacking, continue boost (breakfast, 2 at dinner)  RTC in 3 months with Dr. Silas Flood.     Current Outpatient Medications:  .  acyclovir (ZOVIRAX) 400 MG tablet, Take  400 mg by mouth 2 (two) times daily., Disp: , Rfl:  .  albuterol (PROVENTIL HFA;VENTOLIN HFA) 108 (90 Base) MCG/ACT inhaler, Inhale 2 puffs into the lungs every 6 (six) hours as needed for wheezing or shortness of breath., Disp: 1 Inhaler, Rfl: 0 .  Amikacin Sulfate Liposome (ARIKAYCE) 590 MG/8.4ML SUSP, Inhale the contents of one vial (590 mg) via Lamira device 1 time daily as directed., Disp: 235.2 mL, Rfl: 28 .  ascorbic acid (VITAMIN C) 500 MG tablet, Take 500 mg by mouth daily., Disp: , Rfl:  .  atorvastatin (LIPITOR) 20 MG tablet, Take 20 mg by mouth daily., Disp: , Rfl:  .  Azelastine HCl 137 MCG/SPRAY SOLN, , Disp: , Rfl:  .  B Complex Vitamins (B COMPLEX-B12 PO), Take 1 tablet by mouth daily., Disp: , Rfl:  .  benzonatate (TESSALON PERLES) 100 MG capsule, Take 1 capsule (100 mg total) by mouth 3 (three) times daily as needed for cough., Disp: 90 capsule, Rfl: 5 .  calcium carbonate (OSCAL) 1500 (600 Ca) MG TABS tablet, Take 600 mg of elemental calcium by mouth 2 (two) times daily with a meal., Disp: , Rfl:  .  cetirizine (ZYRTEC) 10 MG tablet, every morning., Disp: , Rfl:  .  Cholecalciferol (VITAMIN D3) 125 MCG (5000 UT) TABS, Take 1 tablet by mouth., Disp: , Rfl:  .  CLOFAZIMINE PO, Take 2 tablets by mouth daily., Disp: , Rfl:  .  ethambutol (MYAMBUTOL) 400 MG tablet, TAKE 3 TABLETS EVERY DAY, Disp: 270 tablet, Rfl: 3 .  Flaxseed, Linseed, (FLAX SEED OIL) 1300 MG CAPS, Take by mouth daily., Disp: , Rfl:  .  Glycopyrrolate-Formoterol (BEVESPI AEROSPHERE) 9-4.8 MCG/ACT AERO, Inhale 2 puffs  into the lungs in the morning and at bedtime., Disp: 32.1 g, Rfl: 4 .  hydrocortisone (ANUSOL-HC) 2.5 % rectal cream, Proctozone-HC 2.5 % topical cream perineal applicator, Disp: , Rfl:  .  hydrocortisone 2.5 % cream, hydrocortisone 2.5 % topical cream  APPLY A THIN LAYER TO THE  AFFECTED AREA THREE TIMES DAILY AS NEEDED, Disp: , Rfl:  .  Melatonin 10 MG CAPS, 10 mg nightly as needed., Disp: , Rfl:  .   omeprazole (PRILOSEC) 40 MG capsule, TAKE 1 CAPSULE TWICE DAILY, Disp: 180 capsule, Rfl: 1 .  Probiotic Product (PROBIOTIC DAILY PO), Take 1 capsule by mouth daily., Disp: , Rfl:  .  Respiratory Therapy Supplies (FLUTTER) DEVI, Use as directed., Disp: 1 each, Rfl: 0 .  rifampin (RIFADIN) 300 MG capsule, Take 2 capsules (600 mg total) by mouth daily., Disp: 180 capsule, Rfl: 3 .  sodium chloride HYPERTONIC 3 % nebulizer solution, INHALE THE CONTENTS OF 1 VIAL ( 4ML ) VIA NEBULIZER TWICE DAILY, Disp: 720 mL, Rfl: 3 .  Spacer/Aero-Holding Chambers (AEROCHAMBER MV) inhaler, Use as instructed, Disp: 1 each, Rfl: 2 .  TURMERIC PO, Take 2,000 mg by mouth daily. , Disp: , Rfl:  .  zinc gluconate 50 MG tablet, Take 50 mg by mouth daily., Disp: , Rfl:           Patient Instructions by Lanier Clam, MD at 02/20/2021 1:30 PM  Author: Lanier Clam, MD Author Type: Physician Filed: 02/20/2021 1:49 PM  Note Status: Addendum Mickle Mallory: Cosign Not Required Encounter Date: 02/20/2021  Editor: Lanier Clam, MD (Physician)      Prior Versions: 1. Hunsucker, Bonna Gains, MD (Physician) at 02/20/2021 1:49 PM - Signed    Nice to see you again  Try using Trelegy 1 puff daily to see if the addition of the inhaled steroid helps your breathing.  You can stop the Bevespi while you are using this.  If it is not very helpful, please let me know and return to using the [facility] as prescribed.  If this change is very helpful, I can send in a new prescription.  I will send your sputum to the lab today.  Return to clinic in 3 months with Dr. Silas Flood for follow-up.        Instructions     Return in about 3 months (around 05/23/2021).  Nice to see you again  Try using Trelegy 1 puff daily to see if the addition of the inhaled steroid helps your breathing.  You can stop the Bevespi while you are using this.  If it is not very helpful, please let me know and return to using the  [facility] as prescribed.  If this change is very helpful, I can send in a new prescription.  I will send your sputum to the lab today.  Return to clinic in 3 months with Dr. Silas Flood for follow-up.        Reason for call: Increased sob this week since Monday whenever she exerts herself.  Coughing all the time, sometimes it is yellow, sometimes clear, sometimes dry and sometimes bright red blood, sometimes lasts 5 minutes.  Had nausea/dry heaves this morning, then it went away.  Denies fever, chills, body aches.  Down to 111 lbs, can't get weight back on.  She has no appetite, cannot eat much.  Drinks a lot of water.  She would like to know more about pulmonary rehab.  Sleeps well during the night.  She is tired when  she wakes up, does not have the energy she thinks she should have.  Uses flutter valve some, but not everyday.  Used the Trelegy, helped with SOB with samples, she got out in her flower bed and had not been able to do that in a while.  Started prescription and now does not seem to be helping.  Using hypertonic nebulizer solution one time daily.  She is only using cough drops for the cough.  She says [employer] cannot get the 3% hypertonic nebulizer solution because it is on back order.  I let her know that we can get it from [place] through [place].  She will call and see how much it will cost her because she is in the catastrophic level with her insurance.  Advised I would get her information to Dr. Silas Flood to get recommendations and then we would contact her again.  Dr. Silas Flood, please advise.  Thank you.  (examples of things to ask: : When did symptoms start? Fever? Cough? Productive? Color to sputum? More sputum than usual? Wheezing? Have you needed increased oxygen? Are you taking your respiratory medications? What over the counter measures have you tried?)  No Known Allergies  Immunization History  Administered Date(s) Administered  . Fluad Quad(high Dose 65+)  09/24/2020  . Influenza Split 09/13/2012, 09/06/2014  . Influenza Whole 09/09/2009, 08/14/2010, 09/14/2011  . Influenza, High Dose Seasonal PF 10/10/2015  . Influenza,inj,Quad PF,6+ Mos 09/13/2013, 09/21/2016, 08/26/2017, 08/22/2018  . Influenza-Unspecified 08/29/2019  . Moderna Sars-Covid-2 Vaccination 01/03/2020, 01/31/2020, 08/02/2020  . Pneumococcal Conjugate-13 11/24/2014  . Pneumococcal Polysaccharide-23 07/17/2009

## 2021-04-10 NOTE — Telephone Encounter (Signed)
Augmentin BID x 14 days  for bronchiectasis exacerbation (OP flora on prior cultures) - please ask her to increase saline nebs and flutter valve to 4 times a day

## 2021-04-11 NOTE — Telephone Encounter (Signed)
Spoke with the pt and notified of recs per Dr Silas Flood and she verbalized understanding. The rx had already been sent.

## 2021-04-11 NOTE — Telephone Encounter (Signed)
LMTCB and forwarding back to triage to f/u again today

## 2021-04-16 ENCOUNTER — Telehealth: Payer: Self-pay | Admitting: Pulmonary Disease

## 2021-04-16 MED ORDER — SODIUM CHLORIDE 3 % IN NEBU: INHALATION_SOLUTION | RESPIRATORY_TRACT | 3 refills | Status: DC

## 2021-04-16 NOTE — Telephone Encounter (Signed)
Spoke with patient who states that [facility] sent a form over to our office on 04/11/21 for sodium chloride nebulizer medication and has not heard anything back. Advised patient that I can send order over to [facility] mail order pharmacy for her. LOV 02/20/21. RX has been sent. Nothing further needed at this time.

## 2021-04-16 NOTE — Telephone Encounter (Signed)
LMTCB

## 2021-04-22 ENCOUNTER — Ambulatory Visit: Payer: Medicare HMO | Admitting: Internal Medicine

## 2021-04-22 ENCOUNTER — Other Ambulatory Visit: Payer: Self-pay

## 2021-04-22 DIAGNOSIS — A310 Pulmonary mycobacterial infection: Secondary | ICD-10-CM

## 2021-04-22 DIAGNOSIS — R634 Abnormal weight loss: Secondary | ICD-10-CM

## 2021-04-22 DIAGNOSIS — J479 Bronchiectasis, uncomplicated: Secondary | ICD-10-CM | POA: Diagnosis not present

## 2021-04-22 NOTE — Assessment & Plan Note (Signed)
She continues to lose weight.  I encouraged her to eat frequent (hourly) snacks between her meals.

## 2021-04-22 NOTE — Assessment & Plan Note (Signed)
She will complete her 2-week course of amoxicillin clavulanate.

## 2021-04-22 NOTE — Progress Notes (Signed)
[place] for Infectious Disease  Patient Active Problem List   Diagnosis Date Noted  . 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 BRONCHIECTASIS 05/23/2009    Priority: Medium  . Depression 06/27/2020  . Unintentional weight loss 05/17/2019  . Dysphonia 01/11/2018  . Right shoulder pain 01/21/2017  . Encounter for hepatitis C screening test for low risk patient 01/21/2017  . GERD (gastroesophageal reflux disease) 02/21/2016  . 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 Dyslipidemia 11/14/2014  . Allergic rhinitis 05/03/2009    Patient's Medications  New Prescriptions   No medications on file  Previous Medications   ACYCLOVIR (ZOVIRAX) 400 MG TABLET    Take 400 mg by mouth 2 (two) times daily.   ALBUTEROL (PROVENTIL HFA;VENTOLIN HFA) 108 (90 BASE) MCG/ACT INHALER    Inhale 2 puffs into the lungs every 6 (six) hours as needed for wheezing or shortness of breath.   AMIKACIN SULFATE LIPOSOME (ARIKAYCE) 590 MG/8.4ML SUSP    Inhale the contents of one vial (590 mg) via Lamira device 1 time daily as directed.   AMOXICILLIN-CLAVULANATE (AUGMENTIN) 875-125 MG TABLET    Take 1 tablet by mouth 2 (two) times daily for 14 days.   ASCORBIC ACID (VITAMIN C) 500 MG TABLET    Take 500 mg by mouth daily.   ATORVASTATIN (LIPITOR) 20 MG TABLET    Take 20 mg by mouth daily.   AZELASTINE HCL 137 MCG/SPRAY SOLN       B COMPLEX VITAMINS (B COMPLEX-B12 PO)    Take 1 tablet by mouth daily.   BENZONATATE (TESSALON PERLES) 100 MG CAPSULE    Take 1 capsule (100 mg total) by mouth 3 (three) times daily as needed for cough.   CALCIUM CARBONATE (OSCAL) 1500 (600 CA) MG TABS TABLET    Take 600 mg of elemental calcium by mouth 2 (two) times daily with a meal.   CETIRIZINE (ZYRTEC) 10 MG TABLET    every morning.   CHOLECALCIFEROL (VITAMIN D3) 125 MCG (5000 UT) TABS    Take 1 tablet by mouth.   CLOFAZIMINE PO    Take 2 tablets by mouth daily.   ETHAMBUTOL (MYAMBUTOL) 400  MG TABLET    TAKE 3 TABLETS EVERY DAY   FLAXSEED, LINSEED, (FLAX SEED OIL) 1300 MG CAPS    Take by mouth daily.   FLUTICASONE-UMECLIDIN-VILANT (TRELEGY ELLIPTA) 100-62.5-25 MCG/INH AEPB    Inhale 1 puff into the lungs daily.   HYDROCORTISONE (ANUSOL-HC) 2.5 % RECTAL CREAM    Proctozone-HC 2.5 % topical cream perineal applicator   HYDROCORTISONE 2.5 % CREAM    hydrocortisone 2.5 % topical cream  APPLY A THIN LAYER TO THE  AFFECTED AREA THREE TIMES DAILY AS NEEDED   MELATONIN 10 MG CAPS    10 mg nightly as needed.   OMEPRAZOLE (PRILOSEC) 40 MG CAPSULE    TAKE 1 CAPSULE TWICE DAILY   PROBIOTIC PRODUCT (PROBIOTIC DAILY PO)    Take 1 capsule by mouth daily.   RESPIRATORY THERAPY SUPPLIES (FLUTTER) DEVI    Use as directed.   RIFAMPIN (RIFADIN) 300 MG CAPSULE    Take 2 capsules (600 mg total) by mouth daily.   SODIUM CHLORIDE HYPERTONIC 3 % NEBULIZER SOLUTION    INHALE THE CONTENTS OF 1 VIAL ( 4ML ) VIA NEBULIZER TWICE DAILY   SPACER/AERO-HOLDING CHAMBERS (AEROCHAMBER MV) INHALER    Use as instructed   TURMERIC PO    Take 2,000 mg by mouth  daily.    ZINC GLUCONATE 50 MG TABLET    Take 50 mg by mouth daily.  Modified Medications   No medications on file  Discontinued Medications   No medications on file    Subjective: Kiara Medina is in for her routine follow-up appointment.  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  She was not treated at that time.  She tested positive again in March 2015.  She started on azithromycin, ethambutol and rifampin in December 2015 and completed the course of therapy in November 2016.  She relapsed in June 2018 and started on azithromycin, ethambutol and rifampin again.  She saw Dr. Brigitte Pulse at [facility] in November 2018.  Aerosolized amikacin was added in January 2019  because of evolving resistance.  In September 2019 azithromycin was changed to clofazimine and she continued ethambutol, rifampin and aerosolized amikacin.  She has  tolerated her current antibiotics fairly well.  She was recently started on amoxicillin clavulanate by her pulmonologist because of an exacerbation of her cough.  She feels like it has helped a little bit.  She did have one episode of hemoptysis last night it was only a small amount.  Recently, she has had some discomfort in her right upper back.  She has been taking Aleve twice daily with some relief.  Is also had some recent night sweats but does not feel like she has had any fever.  She has not had any chills.  She has not noted any change in her chronic dyspnea on exertion.  She continues to lose weight.  She says that her appetite is good but she has early satiety and does not eat as much with her meals.  She is trying to snack in between meals.  Review of Systems: Review of Systems  Constitutional: Positive for diaphoresis, malaise/fatigue and weight loss. Negative for chills and fever.  Respiratory: Positive for cough, hemoptysis, sputum production and shortness of breath.   Cardiovascular: Negative for chest pain.  Gastrointestinal: Negative for abdominal pain, diarrhea, nausea and vomiting.  Musculoskeletal: Positive for back pain.  Skin: Negative for rash.    Past Medical History:  Diagnosis Date  . Allergic rhinitis, cause unspecified   . Bronchiectasis without acute exacerbation ([place])   . Cough   . Sinusitis     Social History   Tobacco Use  . Smoking status: Former Smoker    Packs/day: 1.00    Years: 20.00    Pack years: 20.00    Types: Cigarettes    Quit date: 12/14/1985    Years since quitting: 35.3  . Smokeless tobacco: Never Used  Vaping Use  . Vaping Use: Never used  Substance Use Topics  . Alcohol use: Yes    Alcohol/week: 1.0 standard drink    Types: 1 Glasses of wine per week    Comment: occ  . Drug use: No    Family History  Problem Relation Age of Onset  . Emphysema Sister   . Stroke Mother   . Clotting disorder Mother     No Known  Allergies  Objective: Vitals:   04/22/21 1604  Pulse: 99  Temp: 97.6 F (36.4 C)  TempSrc: Oral  SpO2: 96%  Weight: 112 lb (50.8 kg)   Body mass index is 19.22 kg/m.  Physical Exam Constitutional:      Comments: She is in good spirits.  Her weight is down 10 pounds since October.  Cardiovascular:     Rate and  Rhythm: Normal rate and regular rhythm.     Heart sounds: No murmur heard.   Pulmonary:     Effort: Pulmonary effort is normal.     Breath sounds: Rales present. No wheezing.  Abdominal:     Palpations: Abdomen is soft.     Tenderness: There is no abdominal tenderness.  Psychiatric:        Mood and Affect: Mood normal.         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  I suspect that her current antibiotic regimen may be preventing rapid progression of her infection but that is difficult to say without taking a drug holiday.  She prefers to continue her current regimen for now.  I will check blood work today and see her back in 3 months.      Relevant Orders   CBC   Comprehensive metabolic panel     Medium   BRONCHIECTASIS    She will complete her 2-week course of amoxicillin clavulanate.        Unprioritized   Unintentional weight loss    She continues to lose weight.  I encouraged her to eat frequent (hourly) snacks between her meals.          Michel Bickers, MD [facility] [phone] pager   [phone] cell 04/22/2021, 4:27 PM

## 2021-04-22 NOTE — Assessment & Plan Note (Signed)
She has relapsed, drug-resistant Mycobacterium avium pneumonia.  I suspect that her current antibiotic regimen may be preventing rapid progression of her infection but that is difficult to say without taking a drug holiday.  She prefers to continue her current regimen for now.  I will check blood work today and see her back in 3 months.

## 2021-04-23 LAB — COMPREHENSIVE METABOLIC PANEL
AG Ratio: 1.2 (calc) (ref 1.0–2.5)
ALT: 16 U/L (ref 6–29)
AST: 24 U/L (ref 10–35)
Albumin: 3.5 g/dL — ABNORMAL LOW (ref 3.6–5.1)
Alkaline phosphatase (APISO): 77 U/L (ref 37–153)
BUN: 14 mg/dL (ref 7–25)
CO2: 28 mmol/L (ref 20–32)
Calcium: 9.2 mg/dL (ref 8.6–10.4)
Chloride: 102 mmol/L (ref 98–110)
Creat: 0.87 mg/dL (ref 0.60–0.93)
Globulin: 3 g/dL (calc) (ref 1.9–3.7)
Glucose, Bld: 81 mg/dL (ref 65–99)
Potassium: 4.5 mmol/L (ref 3.5–5.3)
Sodium: 138 mmol/L (ref 135–146)
Total Bilirubin: 0.4 mg/dL (ref 0.2–1.2)
Total Protein: 6.5 g/dL (ref 6.1–8.1)

## 2021-04-23 LAB — CBC
HCT: 40.6 % (ref 35.0–45.0)
Hemoglobin: 13.4 g/dL (ref 11.7–15.5)
MCH: 31.2 pg (ref 27.0–33.0)
MCHC: 33 g/dL (ref 32.0–36.0)
MCV: 94.4 fL (ref 80.0–100.0)
MPV: 10.4 fL (ref 7.5–12.5)
Platelets: 245 10*3/uL (ref 140–400)
RBC: 4.3 10*6/uL (ref 3.80–5.10)
RDW: 13.3 % (ref 11.0–15.0)
WBC: 6.6 10*3/uL (ref 3.8–10.8)

## 2021-04-30 DIAGNOSIS — Z961 Presence of intraocular lens: Secondary | ICD-10-CM | POA: Diagnosis not present

## 2021-05-20 DIAGNOSIS — E559 Vitamin D deficiency, unspecified: Secondary | ICD-10-CM | POA: Diagnosis not present

## 2021-05-20 DIAGNOSIS — Z1231 Encounter for screening mammogram for malignant neoplasm of breast: Secondary | ICD-10-CM | POA: Diagnosis not present

## 2021-05-20 DIAGNOSIS — E785 Hyperlipidemia, unspecified: Secondary | ICD-10-CM | POA: Diagnosis not present

## 2021-05-20 DIAGNOSIS — R7301 Impaired fasting glucose: Secondary | ICD-10-CM | POA: Diagnosis not present

## 2021-05-20 DIAGNOSIS — J479 Bronchiectasis, uncomplicated: Secondary | ICD-10-CM | POA: Diagnosis not present

## 2021-05-20 DIAGNOSIS — K219 Gastro-esophageal reflux disease without esophagitis: Secondary | ICD-10-CM | POA: Diagnosis not present

## 2021-05-20 DIAGNOSIS — A310 Pulmonary mycobacterial infection: Secondary | ICD-10-CM | POA: Diagnosis not present

## 2021-05-26 ENCOUNTER — Other Ambulatory Visit: Payer: Self-pay

## 2021-05-26 ENCOUNTER — Encounter: Payer: Self-pay | Admitting: Pulmonary Disease

## 2021-05-26 ENCOUNTER — Ambulatory Visit: Payer: Medicare HMO | Admitting: Pulmonary Disease

## 2021-05-26 VITALS — BP 120/64 | HR 88 | Temp 98.4°F | Ht 64.0 in | Wt 108.0 lb

## 2021-05-26 DIAGNOSIS — J479 Bronchiectasis, uncomplicated: Secondary | ICD-10-CM

## 2021-05-26 DIAGNOSIS — R0600 Dyspnea, unspecified: Secondary | ICD-10-CM

## 2021-05-26 DIAGNOSIS — R0609 Other forms of dyspnea: Secondary | ICD-10-CM

## 2021-05-26 DIAGNOSIS — A310 Pulmonary mycobacterial infection: Secondary | ICD-10-CM

## 2021-05-26 NOTE — Patient Instructions (Signed)
Nice to see you again  Please call me if the cough seems to worsen.  We will try a different antibiotic other than Augmentin next time.

## 2021-05-26 NOTE — Progress Notes (Signed)
Synopsis: Referred in 2010 for bronchiectasis by Nicoletta Dress, MD.  Previously patient of Dr. Normajean Baxter and Dr. Lake Bells and Dr. Carlis Abbott.  Subjective:   PATIENT ID: Kiara Medina GENDER: female DOB: 25-Nov-1946, MRN: [id number]  Chief Complaint  Patient presents with   Follow-up    3 mo f/u for bronchiectasis. States she still has a cough and SOB.    Ms. Pecha is a 75 y/o woman with a history of MDR MAC and bronchiectasis who presents for follow up. She has been on 4 drug suppressive therapy for 3.5 years with persistently positive cultures (clofazamine, ethambutol, rifampin, and inhaled amikacin). Recent office visit with ID on 04/22/2021 - note reviewed. Continues with weight loss, down 4 pounds since last office visit.   Early 04/2021 subscription for Augmentin given ongoing cough.  Seem to help a bit.  However cough seems to be worsening over time.  She admits that some days she coughs big problem but other days she hacks a lot.  Continues with airway clearance.  Has dyspnea on exertion.  Tried Trelegy at last visit.  Seem to help for few weeks.  Now she is not so sure.  Had discussed pulmonary rehab in the past.  She is interested in pursuing this at this time.  Notes her sister treated for COPD in [facility].   Past Medical History:  Diagnosis Date   Allergic rhinitis, cause unspecified    Bronchiectasis without acute exacerbation (HCC)    Cough    Sinusitis      Family History  Problem Relation Age of Onset   Emphysema Sister    Stroke Mother    Clotting disorder Mother      Past Surgical History:  Procedure Laterality Date   APPENDECTOMY     CATARACT EXTRACTION  08/2013   CHOLECYSTECTOMY     TUBAL LIGATION     VESICOVAGINAL FISTULA CLOSURE W/ TAH      Social History   Socioeconomic History   Marital status: Widowed    Spouse name: Not on file   Number of children: Not on file   Years of education: Not on file   Highest education level: Not on file  Occupational  History   Occupation: realtor  Tobacco Use   Smoking status: Former    Packs/day: 1.00    Years: 20.00    Pack years: 20.00    Types: Cigarettes    Quit date: 12/14/1985    Years since quitting: 35.4   Smokeless tobacco: Never  Vaping Use   Vaping Use: Never used  Substance and Sexual Activity   Alcohol use: Yes    Alcohol/week: 1.0 standard drink    Types: 1 Glasses of wine per week    Comment: occ   Drug use: No   Sexual activity: Not on file  Other Topics Concern   Not on file  Social History Narrative   Not on file   Social Determinants of Health   Financial Resource Strain: Not on file  Food Insecurity: Not on file  Transportation Needs: Not on file  Physical Activity: Not on file  Stress: Not on file  Social Connections: Not on file  Intimate Partner Violence: Not on file     No Known Allergies   Immunization History  Administered Date(s) Administered   Fluad Quad(high Dose 65+) 09/24/2020   Influenza Split 09/13/2012, 09/06/2014   Influenza Whole 09/09/2009, 08/14/2010, 09/14/2011   Influenza, High Dose Seasonal PF 10/10/2015   Influenza,inj,Quad PF,6+  Mos 09/13/2013, 09/21/2016, 08/26/2017, 08/22/2018   Influenza-Unspecified 08/29/2019   Moderna Sars-Covid-2 Vaccination 01/03/2020, 01/31/2020, 08/02/2020, 05/06/2021   Pneumococcal Conjugate-13 11/24/2014   Pneumococcal Polysaccharide-23 07/17/2009    Outpatient Medications Prior to Visit  Medication Sig Dispense Refill   acyclovir (ZOVIRAX) 400 MG tablet Take 400 mg by mouth 2 (two) times daily.     Amikacin Sulfate Liposome (ARIKAYCE) 590 MG/8.4ML SUSP Inhale the contents of one vial (590 mg) via Lamira device 1 time daily as directed. 235.2 mL 28   ascorbic acid (VITAMIN C) 500 MG tablet Take 500 mg by mouth daily.     atorvastatin (LIPITOR) 20 MG tablet Take 20 mg by mouth daily.     Azelastine HCl 137 MCG/SPRAY SOLN      B Complex Vitamins (B COMPLEX-B12 PO) Take 1 tablet by mouth daily.      benzonatate (TESSALON PERLES) 100 MG capsule Take 1 capsule (100 mg total) by mouth 3 (three) times daily as needed for cough. 90 capsule 5   calcium carbonate (OSCAL) 1500 (600 Ca) MG TABS tablet Take 600 mg of elemental calcium by mouth 2 (two) times daily with a meal.     cetirizine (ZYRTEC) 10 MG tablet every morning.     Cholecalciferol (VITAMIN D3) 125 MCG (5000 UT) TABS Take 1 tablet by mouth.     CLOFAZIMINE PO Take 2 tablets by mouth daily.     ethambutol (MYAMBUTOL) 400 MG tablet TAKE 3 TABLETS EVERY DAY 270 tablet 3   Flaxseed, Linseed, (FLAX SEED OIL) 1300 MG CAPS Take by mouth daily.     Fluticasone-Umeclidin-Vilant (TRELEGY ELLIPTA) 100-62.5-25 MCG/INH AEPB Inhale 1 puff into the lungs daily. 60 each 5   Melatonin 10 MG CAPS 10 mg nightly as needed.     omeprazole (PRILOSEC) 40 MG capsule TAKE 1 CAPSULE TWICE DAILY 180 capsule 1   Probiotic Product (PROBIOTIC DAILY PO) Take 1 capsule by mouth daily.     Respiratory Therapy Supplies (FLUTTER) DEVI Use as directed. 1 each 0   rifampin (RIFADIN) 300 MG capsule Take 2 capsules (600 mg total) by mouth daily. 180 capsule 3   sodium chloride HYPERTONIC 3 % nebulizer solution INHALE THE CONTENTS OF 1 VIAL ( 4ML ) VIA NEBULIZER TWICE DAILY 720 mL 3   Spacer/Aero-Holding Chambers (AEROCHAMBER MV) inhaler Use as instructed 1 each 2   TURMERIC PO Take 2,000 mg by mouth daily.      zinc gluconate 50 MG tablet Take 50 mg by mouth daily.     albuterol (PROVENTIL HFA;VENTOLIN HFA) 108 (90 Base) MCG/ACT inhaler Inhale 2 puffs into the lungs every 6 (six) hours as needed for wheezing or shortness of breath. 1 Inhaler 0   hydrocortisone (ANUSOL-HC) 2.5 % rectal cream Proctozone-HC 2.5 % topical cream perineal applicator     hydrocortisone 2.5 % cream hydrocortisone 2.5 % topical cream  APPLY A THIN LAYER TO THE  AFFECTED AREA THREE TIMES DAILY AS NEEDED     No facility-administered medications prior to visit.    Review of Systems  No chest pain.   No hemoptysis.  No worsening sputum production or change in quality of sputum.   Objective:   Vitals:   05/26/21 1350  BP: 120/64  Pulse: 88  Temp: 98.4 F (36.9 C)  TempSrc: Temporal  SpO2: 97%  Weight: 108 lb (49 kg)  Height: _0  (1.626 m)   97% on   RA BMI Readings from Last 3 Encounters:  05/26/21 18.54 kg/m  04/22/21  19.22 kg/m  02/20/21 19.22 kg/m   Wt Readings from Last 3 Encounters:  05/26/21 108 lb (49 kg)  04/22/21 112 lb (50.8 kg)  02/20/21 112 lb (50.8 kg)    Physical Exam BP 120/64   Pulse 88   Temp 98.4 F (36.9 C) (Temporal)   Ht _0  (1.626 m)   Wt 108 lb (49 kg)   SpO2 97% Comment: on RA  BMI 18.54 kg/m  General: Well-appearing in no acute distress, thin Eyes: EOMI, icterus Respiratory: Mild crackles in the bases, otherwise clear to auscultation Cardiovascular: Regular rhythm, no murmur Extremities: Warm, no edema     CBC    Component Value Date/Time   WBC 6.6 04/22/2021 1623   RBC 4.30 04/22/2021 1623   HGB 13.4 04/22/2021 1623   HCT 40.6 04/22/2021 1623   PLT 245 04/22/2021 1623   MCV 94.4 04/22/2021 1623   MCH 31.2 04/22/2021 1623   MCHC 33.0 04/22/2021 1623   RDW 13.3 04/22/2021 1623   LYMPHSABS 1,929 01/10/2019 1657   EOSABS 289 01/10/2019 1657   BASOSABS 18 01/10/2019 1657    CHEMISTRY No results for input(s): NA, K, CL, CO2, GLUCOSE, BUN, CREATININE, CALCIUM, MG, PHOS in the last 168 hours. CrCl cannot be calculated (Patient's most recent lab result is older than the maximum 21 days allowed.).  12/28/2019: IgG 954 IgM 96 IgA 289 IgE 10 IgG subclasses normal (other than increased IgG subclass 3)  Cultures: 3/10322 - pending 11/23/2019-MAI 05/17/2019-MAI 03/15/2019-MAI 01/10/2019 MAI 11/07/2018-MAI 10/10/2018-MAI  08/22/2018-MAI 07/11/2018-MAI 05/31/2018-MAI MAI susceptibilities-resistant clarithromycin, linezolid.  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  Susceptible to amikacin, cefepime, clarithromycin, imipenem, linezolid, trimethoprim sulfamethoxazole. 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  Tree-in-bud opacities diffusely.  Mucus impaction in airways.   Pulmonary Functions Testing Results: PFT Results Latest Ref Rng & Units 04/18/2018  FVC-Pre L 2.08  FVC-Predicted Pre % 79  Pre FEV1/FVC % % 75  FEV1-Pre L 1.57  FEV1-Predicted Pre % 80   2019-no significant obstruction, mildly reduced FVC.     Assessment & Plan:     ICD-10-CM   1. BRONCHIECTASIS  J47.9     2. 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 DOE (dyspnea on exertion)  R06.00 Pulmonary rehab therapeutic exercise      Dyspnea on exertion likely due to chronic bronchiectasis-improved on LAMA/LABA; worsed when back off these meds. Component of deconditioning.  Has seasonal allergies, describes bronchospasm, possible asthma. 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  No obvious immunodeficiencies.  Progressive on CT over the last 6 years.  No evidence of hypersensitivity pneumonitis from inhaled amikacin on CT scan. -Continue hypertonic saline nebs with flutter valve twice daily for airway clearance therapy regimen. -Continue current 4 drug MAI therapy and ID  follow-up.  I agree with Dr. Megan Salon that she has limited treatment options and would likely progress faster off meds.  -Up-to-date on Covid, pneumonia. Flu vaccine today.  Weight loss- concerned this may be a MAC secondary effect,  complicated by fatigue and DOE.  Related to metabolic needs from dyspnea, cough.  Poor appetite. -Con't MAC 4-drug therapy for now as these meds to not have an obvious precipitating effect -Con't snacking, continue boost (breakfast, 2 at dinner), may benefit from nutrition services evaluation  RTC in 3 months with Dr. Silas Flood.     Current Outpatient Medications:    acyclovir (ZOVIRAX) 400 MG tablet, Take 400 mg by mouth 2 (two) times daily., Disp: , Rfl:    Amikacin Sulfate Liposome (ARIKAYCE) 590 MG/8.4ML SUSP, Inhale the contents of one vial (590 mg) via Lamira device 1 time daily as directed., Disp: 235.2 mL, Rfl: 28   ascorbic acid (VITAMIN C) 500 MG tablet, Take 500 mg by mouth daily., Disp: , Rfl:    atorvastatin (LIPITOR) 20 MG tablet, Take 20 mg by mouth daily., Disp: , Rfl:    Azelastine HCl 137 MCG/SPRAY SOLN, , Disp: , Rfl:    B Complex Vitamins (B COMPLEX-B12 PO), Take 1 tablet by mouth daily., Disp: , Rfl:    benzonatate (TESSALON PERLES) 100 MG capsule, Take 1 capsule (100 mg total) by mouth 3 (three) times daily as needed for cough., Disp: 90 capsule, Rfl: 5   calcium carbonate (OSCAL) 1500 (600 Ca) MG TABS tablet, Take 600 mg of elemental calcium by mouth 2 (two) times daily with a meal., Disp: , Rfl:    cetirizine (ZYRTEC) 10 MG tablet, every morning., Disp: , Rfl:    Cholecalciferol (VITAMIN D3) 125 MCG (5000 UT) TABS, Take 1 tablet by mouth., Disp: , Rfl:    CLOFAZIMINE PO, Take 2 tablets by mouth daily., Disp: , Rfl:    ethambutol (MYAMBUTOL) 400 MG tablet, TAKE 3 TABLETS EVERY DAY, Disp: 270 tablet, Rfl: 3   Flaxseed, Linseed, (FLAX SEED OIL) 1300 MG CAPS, Take by mouth daily., Disp: , Rfl:    Fluticasone-Umeclidin-Vilant (TRELEGY ELLIPTA)  100-62.5-25 MCG/INH AEPB, Inhale 1 puff into the lungs daily., Disp: 60 each, Rfl: 5   Melatonin 10 MG CAPS, 10 mg nightly as needed., Disp: , Rfl:    omeprazole (PRILOSEC) 40 MG capsule, TAKE 1 CAPSULE TWICE DAILY, Disp: 180 capsule, Rfl: 1   Probiotic Product (PROBIOTIC DAILY PO), Take 1 capsule by mouth daily., Disp: , Rfl:    Respiratory Therapy Supplies (FLUTTER) DEVI, Use as directed., Disp: 1 each, Rfl: 0   rifampin (RIFADIN) 300 MG capsule, Take 2 capsules (600 mg total) by mouth daily., Disp: 180 capsule, Rfl: 3   sodium chloride HYPERTONIC 3 % nebulizer solution, INHALE THE CONTENTS OF 1 VIAL ( 4ML ) VIA NEBULIZER TWICE DAILY, Disp: 720 mL, Rfl: 3   Spacer/Aero-Holding Chambers (AEROCHAMBER MV) inhaler, Use as instructed, Disp: 1 each, Rfl: 2   TURMERIC PO, Take 2,000 mg by mouth daily. , Disp: , Rfl:    zinc gluconate 50 MG tablet, Take 50 mg by mouth daily., Disp: , Rfl:

## 2021-05-26 NOTE — Addendum Note (Signed)
Addended by: Valerie Salts on: 05/26/2021 02:58 PM   Modules accepted: Orders

## 2021-05-29 ENCOUNTER — Telehealth: Payer: Self-pay | Admitting: Pulmonary Disease

## 2021-05-29 NOTE — Telephone Encounter (Signed)
Spoke with the pt  She states that she spoke with [facility] about her pulmonary rehab referral to [place] and that they need authorization for this  Her appt is 06/10/21 with rehab Please advise PCC's thanks

## 2021-05-30 NOTE — Telephone Encounter (Signed)
Entered prior auth into [employer] portal.  Waiting for decision.

## 2021-06-02 NOTE — Telephone Encounter (Signed)
Per [employer] - No PA Req for G0239-Ref# [id number],  [id number]-Ref# [id number],  [id number]-Ref# [id number]

## 2021-06-03 DIAGNOSIS — M898X1 Other specified disorders of bone, shoulder: Secondary | ICD-10-CM | POA: Diagnosis not present

## 2021-06-03 DIAGNOSIS — R1031 Right lower quadrant pain: Secondary | ICD-10-CM | POA: Diagnosis not present

## 2021-06-03 DIAGNOSIS — J471 Bronchiectasis with (acute) exacerbation: Secondary | ICD-10-CM | POA: Diagnosis not present

## 2021-06-10 DIAGNOSIS — J479 Bronchiectasis, uncomplicated: Secondary | ICD-10-CM | POA: Diagnosis not present

## 2021-06-11 DIAGNOSIS — J479 Bronchiectasis, uncomplicated: Secondary | ICD-10-CM | POA: Diagnosis not present

## 2021-06-13 DIAGNOSIS — R918 Other nonspecific abnormal finding of lung field: Secondary | ICD-10-CM | POA: Diagnosis not present

## 2021-06-13 DIAGNOSIS — R1031 Right lower quadrant pain: Secondary | ICD-10-CM | POA: Diagnosis not present

## 2021-06-13 DIAGNOSIS — J479 Bronchiectasis, uncomplicated: Secondary | ICD-10-CM | POA: Diagnosis not present

## 2021-06-13 DIAGNOSIS — K449 Diaphragmatic hernia without obstruction or gangrene: Secondary | ICD-10-CM | POA: Diagnosis not present

## 2021-06-13 DIAGNOSIS — R1030 Lower abdominal pain, unspecified: Secondary | ICD-10-CM | POA: Diagnosis not present

## 2021-06-13 DIAGNOSIS — Z9049 Acquired absence of other specified parts of digestive tract: Secondary | ICD-10-CM | POA: Diagnosis not present

## 2021-06-13 DIAGNOSIS — Z90710 Acquired absence of both cervix and uterus: Secondary | ICD-10-CM | POA: Diagnosis not present

## 2021-06-17 ENCOUNTER — Telehealth: Payer: Self-pay | Admitting: Pharmacist

## 2021-06-17 DIAGNOSIS — J479 Bronchiectasis, uncomplicated: Secondary | ICD-10-CM | POA: Diagnosis not present

## 2021-06-17 DIAGNOSIS — A310 Pulmonary mycobacterial infection: Secondary | ICD-10-CM | POA: Diagnosis not present

## 2021-06-17 NOTE — Telephone Encounter (Signed)
Patient will pick up refill of clofazimine on Thursday, July 7th.

## 2021-06-18 DIAGNOSIS — A310 Pulmonary mycobacterial infection: Secondary | ICD-10-CM | POA: Diagnosis not present

## 2021-06-18 DIAGNOSIS — J479 Bronchiectasis, uncomplicated: Secondary | ICD-10-CM | POA: Diagnosis not present

## 2021-06-20 DIAGNOSIS — A310 Pulmonary mycobacterial infection: Secondary | ICD-10-CM | POA: Diagnosis not present

## 2021-06-20 DIAGNOSIS — J479 Bronchiectasis, uncomplicated: Secondary | ICD-10-CM | POA: Diagnosis not present

## 2021-06-23 DIAGNOSIS — A310 Pulmonary mycobacterial infection: Secondary | ICD-10-CM | POA: Diagnosis not present

## 2021-06-23 DIAGNOSIS — J479 Bronchiectasis, uncomplicated: Secondary | ICD-10-CM | POA: Diagnosis not present

## 2021-06-25 DIAGNOSIS — A310 Pulmonary mycobacterial infection: Secondary | ICD-10-CM | POA: Diagnosis not present

## 2021-06-25 DIAGNOSIS — J479 Bronchiectasis, uncomplicated: Secondary | ICD-10-CM | POA: Diagnosis not present

## 2021-06-27 DIAGNOSIS — A310 Pulmonary mycobacterial infection: Secondary | ICD-10-CM | POA: Diagnosis not present

## 2021-06-27 DIAGNOSIS — J479 Bronchiectasis, uncomplicated: Secondary | ICD-10-CM | POA: Diagnosis not present

## 2021-06-30 DIAGNOSIS — A310 Pulmonary mycobacterial infection: Secondary | ICD-10-CM | POA: Diagnosis not present

## 2021-06-30 DIAGNOSIS — J479 Bronchiectasis, uncomplicated: Secondary | ICD-10-CM | POA: Diagnosis not present

## 2021-07-02 DIAGNOSIS — J479 Bronchiectasis, uncomplicated: Secondary | ICD-10-CM | POA: Diagnosis not present

## 2021-07-02 DIAGNOSIS — A310 Pulmonary mycobacterial infection: Secondary | ICD-10-CM | POA: Diagnosis not present

## 2021-07-07 DIAGNOSIS — A310 Pulmonary mycobacterial infection: Secondary | ICD-10-CM | POA: Diagnosis not present

## 2021-07-07 DIAGNOSIS — J479 Bronchiectasis, uncomplicated: Secondary | ICD-10-CM | POA: Diagnosis not present

## 2021-07-11 DIAGNOSIS — J471 Bronchiectasis with (acute) exacerbation: Secondary | ICD-10-CM | POA: Diagnosis not present

## 2021-07-16 DIAGNOSIS — J471 Bronchiectasis with (acute) exacerbation: Secondary | ICD-10-CM | POA: Diagnosis not present

## 2021-07-16 DIAGNOSIS — A310 Pulmonary mycobacterial infection: Secondary | ICD-10-CM | POA: Diagnosis not present

## 2021-07-16 DIAGNOSIS — J479 Bronchiectasis, uncomplicated: Secondary | ICD-10-CM | POA: Diagnosis not present

## 2021-07-22 ENCOUNTER — Ambulatory Visit: Payer: Medicare HMO | Admitting: Internal Medicine

## 2021-07-23 DIAGNOSIS — A310 Pulmonary mycobacterial infection: Secondary | ICD-10-CM | POA: Diagnosis not present

## 2021-07-23 DIAGNOSIS — J479 Bronchiectasis, uncomplicated: Secondary | ICD-10-CM | POA: Diagnosis not present

## 2021-07-23 DIAGNOSIS — J471 Bronchiectasis with (acute) exacerbation: Secondary | ICD-10-CM | POA: Diagnosis not present

## 2021-07-28 DIAGNOSIS — J471 Bronchiectasis with (acute) exacerbation: Secondary | ICD-10-CM | POA: Diagnosis not present

## 2021-07-28 DIAGNOSIS — J479 Bronchiectasis, uncomplicated: Secondary | ICD-10-CM | POA: Diagnosis not present

## 2021-07-28 DIAGNOSIS — A310 Pulmonary mycobacterial infection: Secondary | ICD-10-CM | POA: Diagnosis not present

## 2021-07-30 DIAGNOSIS — J471 Bronchiectasis with (acute) exacerbation: Secondary | ICD-10-CM | POA: Diagnosis not present

## 2021-07-30 DIAGNOSIS — J479 Bronchiectasis, uncomplicated: Secondary | ICD-10-CM | POA: Diagnosis not present

## 2021-07-30 DIAGNOSIS — A310 Pulmonary mycobacterial infection: Secondary | ICD-10-CM | POA: Diagnosis not present

## 2021-08-01 DIAGNOSIS — J471 Bronchiectasis with (acute) exacerbation: Secondary | ICD-10-CM | POA: Diagnosis not present

## 2021-08-01 DIAGNOSIS — J479 Bronchiectasis, uncomplicated: Secondary | ICD-10-CM | POA: Diagnosis not present

## 2021-08-01 DIAGNOSIS — A310 Pulmonary mycobacterial infection: Secondary | ICD-10-CM | POA: Diagnosis not present

## 2021-08-04 DIAGNOSIS — A310 Pulmonary mycobacterial infection: Secondary | ICD-10-CM | POA: Diagnosis not present

## 2021-08-04 DIAGNOSIS — J471 Bronchiectasis with (acute) exacerbation: Secondary | ICD-10-CM | POA: Diagnosis not present

## 2021-08-04 DIAGNOSIS — J479 Bronchiectasis, uncomplicated: Secondary | ICD-10-CM | POA: Diagnosis not present

## 2021-08-06 DIAGNOSIS — J471 Bronchiectasis with (acute) exacerbation: Secondary | ICD-10-CM | POA: Diagnosis not present

## 2021-08-06 DIAGNOSIS — A310 Pulmonary mycobacterial infection: Secondary | ICD-10-CM | POA: Diagnosis not present

## 2021-08-06 DIAGNOSIS — J479 Bronchiectasis, uncomplicated: Secondary | ICD-10-CM | POA: Diagnosis not present

## 2021-08-07 ENCOUNTER — Ambulatory Visit: Payer: Medicare HMO | Admitting: Pulmonary Disease

## 2021-08-07 ENCOUNTER — Encounter: Payer: Self-pay | Admitting: Pulmonary Disease

## 2021-08-07 ENCOUNTER — Other Ambulatory Visit: Payer: Self-pay

## 2021-08-07 VITALS — BP 114/68 | HR 97 | Ht 64.0 in | Wt 103.8 lb

## 2021-08-07 DIAGNOSIS — J479 Bronchiectasis, uncomplicated: Secondary | ICD-10-CM | POA: Diagnosis not present

## 2021-08-07 DIAGNOSIS — Z1231 Encounter for screening mammogram for malignant neoplasm of breast: Secondary | ICD-10-CM | POA: Diagnosis not present

## 2021-08-07 DIAGNOSIS — R634 Abnormal weight loss: Secondary | ICD-10-CM

## 2021-08-07 MED ORDER — AMOXICILLIN-POT CLAVULANATE 875-125 MG PO TABS: 1.0000 | ORAL_TABLET | Freq: Two times a day (BID) | ORAL | 0 refills | Status: AC

## 2021-08-07 NOTE — Patient Instructions (Signed)
Nice to see you again.  I am sorry you are not feeling well.  Use Augmentin 1 pill twice a day for 14 days.  I recommend you increase her hypertonic saline nebulizer to twice a day for the next 14 days while on Augmentin.  Let see if this helps the cough.  Continue the pulmonary rehab.  I am happy to hear that he seems to have helped some.  Return to clinic in 3 months or sooner as needed with Dr. Silas Flood.

## 2021-08-08 DIAGNOSIS — J471 Bronchiectasis with (acute) exacerbation: Secondary | ICD-10-CM | POA: Diagnosis not present

## 2021-08-08 DIAGNOSIS — A310 Pulmonary mycobacterial infection: Secondary | ICD-10-CM | POA: Diagnosis not present

## 2021-08-08 DIAGNOSIS — J479 Bronchiectasis, uncomplicated: Secondary | ICD-10-CM | POA: Diagnosis not present

## 2021-08-11 DIAGNOSIS — J479 Bronchiectasis, uncomplicated: Secondary | ICD-10-CM | POA: Diagnosis not present

## 2021-08-11 DIAGNOSIS — J471 Bronchiectasis with (acute) exacerbation: Secondary | ICD-10-CM | POA: Diagnosis not present

## 2021-08-11 DIAGNOSIS — A310 Pulmonary mycobacterial infection: Secondary | ICD-10-CM | POA: Diagnosis not present

## 2021-08-11 NOTE — Progress Notes (Signed)
Synopsis: Referred in 2010 for bronchiectasis by Nicoletta Dress, MD.  Previously patient of Dr. Normajean Baxter and Dr. Lake Bells and Dr. Carlis Abbott.  Subjective:   PATIENT ID: Kiara Medina GENDER: female DOB: [birth year], MRN: [id number]  Chief Complaint  Patient presents with   Follow-up    2 mo f/u. States she has been losing weight due to lack of appetite. Cough has returned, coughing non-stop during the day. Semi-productive cough with thick clear phlegm.    Kiara Medina is a 75 y.o. woman with a history of MDR MAC and bronchiectasis who presents for follow up. She has been on 4 drug suppressive therapy for 3.5 years with persistently positive cultures (clofazamine, ethambutol, rifampin, and inhaled amikacin).  Continues with weight loss.   Unfortunate, she has had slow but seems like steady worsening of her cough and overall weight.  She has ongoing productive cough.  Brings up phlegm.  She is continuing her 4 drug MAC therapy.  She feels rundown.  No energy.  Past Medical History:  Diagnosis Date   Allergic rhinitis, cause unspecified    Bronchiectasis without acute exacerbation (HCC)    Cough    Sinusitis      Family History  Problem Relation Age of Onset   Emphysema Sister    Stroke Mother    Clotting disorder Mother      Past Surgical History:  Procedure Laterality Date   APPENDECTOMY     CATARACT EXTRACTION  08/2013   CHOLECYSTECTOMY     TUBAL LIGATION     VESICOVAGINAL FISTULA CLOSURE W/ TAH      Social History   Socioeconomic History   Marital status: Widowed    Spouse name: Not on file   Number of children: Not on file   Years of education: Not on file   Highest education level: Not on file  Occupational History   Occupation: realtor  Tobacco Use   Smoking status: Former    Packs/day: 1.00    Years: 20.00    Pack years: 20.00    Types: Cigarettes    Quit date: 12/14/1985    Years since quitting: 35.6   Smokeless tobacco: Never  Vaping Use   Vaping Use: Never  used  Substance and Sexual Activity   Alcohol use: Yes    Alcohol/week: 1.0 standard drink    Types: 1 Glasses of wine per week    Comment: occ   Drug use: No   Sexual activity: Not on file  Other Topics Concern   Not on file  Social History Narrative   Not on file   Social Determinants of Health   Financial Resource Strain: Not on file  Food Insecurity: Not on file  Transportation Needs: Not on file  Physical Activity: Not on file  Stress: Not on file  Social Connections: Not on file  Intimate Partner Violence: Not on file     No Known Allergies   Immunization History  Administered Date(s) Administered   Fluad Quad(high Dose 65+) 09/24/2020   Influenza Split 09/13/2012, 09/06/2014   Influenza Whole 09/09/2009, 08/14/2010, 09/14/2011   Influenza, High Dose Seasonal PF 10/10/2015   Influenza,inj,Quad PF,6+ Mos 09/13/2013, 09/21/2016, 08/26/2017, 08/22/2018   Influenza-Unspecified 08/29/2019   Moderna Sars-Covid-2 Vaccination 01/03/2020, 01/31/2020, 08/02/2020, 05/06/2021   Pneumococcal Conjugate-13 11/24/2014   Pneumococcal Polysaccharide-23 07/17/2009    Outpatient Medications Prior to Visit  Medication Sig Dispense Refill   acyclovir (ZOVIRAX) 400 MG tablet Take 400 mg by mouth 2 (two) times daily.  Amikacin Sulfate Liposome (ARIKAYCE) 590 MG/8.4ML SUSP Inhale the contents of one vial (590 mg) via Lamira device 1 time daily as directed. 235.2 mL 28   ascorbic acid (VITAMIN C) 500 MG tablet Take 500 mg by mouth daily.     atorvastatin (LIPITOR) 20 MG tablet Take 20 mg by mouth daily.     Azelastine HCl 137 MCG/SPRAY SOLN      B Complex Vitamins (B COMPLEX-B12 PO) Take 1 tablet by mouth daily.     benzonatate (TESSALON PERLES) 100 MG capsule Take 1 capsule (100 mg total) by mouth 3 (three) times daily as needed for cough. 90 capsule 5   calcium carbonate (OSCAL) 1500 (600 Ca) MG TABS tablet Take 600 mg of elemental calcium by mouth 2 (two) times daily with a meal.      cetirizine (ZYRTEC) 10 MG tablet every morning.     Cholecalciferol (VITAMIN D3) 125 MCG (5000 UT) TABS Take 1 tablet by mouth.     CLOFAZIMINE PO Take 2 tablets by mouth daily.     ethambutol (MYAMBUTOL) 400 MG tablet TAKE 3 TABLETS EVERY DAY 270 tablet 3   Flaxseed, Linseed, (FLAX SEED OIL) 1300 MG CAPS Take by mouth daily.     Fluticasone-Umeclidin-Vilant (TRELEGY ELLIPTA) 100-62.5-25 MCG/INH AEPB Inhale 1 puff into the lungs daily. 60 each 5   Melatonin 10 MG CAPS 10 mg nightly as needed.     omeprazole (PRILOSEC) 40 MG capsule TAKE 1 CAPSULE TWICE DAILY 180 capsule 1   Probiotic Product (PROBIOTIC DAILY PO) Take 1 capsule by mouth daily.     Respiratory Therapy Supplies (FLUTTER) DEVI Use as directed. 1 each 0   rifampin (RIFADIN) 300 MG capsule Take 2 capsules (600 mg total) by mouth daily. 180 capsule 3   sodium chloride HYPERTONIC 3 % nebulizer solution INHALE THE CONTENTS OF 1 VIAL ( 4ML ) VIA NEBULIZER TWICE DAILY 720 mL 3   Spacer/Aero-Holding Chambers (AEROCHAMBER MV) inhaler Use as instructed 1 each 2   TURMERIC PO Take 2,000 mg by mouth daily.      zinc gluconate 50 MG tablet Take 50 mg by mouth daily.     No facility-administered medications prior to visit.    Review of Systems  No chest pain.  No hemoptysis.  No worsening sputum production or change in quality of sputum.   Objective:   Vitals:   08/07/21 1630  BP: 114/68  Pulse: 97  SpO2: 99%  Weight: 103 lb 12.8 oz (47.1 kg)  Height: _0  (1.626 m)   99% on   RA BMI Readings from Last 3 Encounters:  08/07/21 17.82 kg/m  05/26/21 18.54 kg/m  04/22/21 19.22 kg/m   Wt Readings from Last 3 Encounters:  08/07/21 103 lb 12.8 oz (47.1 kg)  05/26/21 108 lb (49 kg)  04/22/21 112 lb (50.8 kg)    Physical Exam BP 114/68   Pulse 97   Ht _1  (1.626 m)   Wt 103 lb 12.8 oz (47.1 kg)   SpO2 99% Comment: on RA  BMI 17.82 kg/m  General: Well-appearing in no acute distress, thin Eyes: EOMI,  icterus Respiratory: Mild crackles in the bases, otherwise clear to auscultation Cardiovascular: Regular rhythm, no murmur Extremities: Warm, no edema     CBC    Component Value Date/Time   WBC 6.6 04/22/2021 1623   RBC 4.30 04/22/2021 1623   HGB 13.4 04/22/2021 1623   HCT 40.6 04/22/2021 1623   PLT 245 04/22/2021 1623  MCV 94.4 04/22/2021 1623   MCH 31.2 04/22/2021 1623   MCHC 33.0 04/22/2021 1623   RDW 13.3 04/22/2021 1623   LYMPHSABS 1,929 01/10/2019 1657   EOSABS 289 01/10/2019 1657   BASOSABS 18 01/10/2019 1657    CHEMISTRY No results for input(s): NA, K, CL, CO2, GLUCOSE, BUN, CREATININE, CALCIUM, MG, PHOS in the last 168 hours. CrCl cannot be calculated (Patient's most recent lab result is older than the maximum 21 days allowed.).  12/28/2019: IgG 954 IgM 96 IgA 289 IgE 10 IgG subclasses normal (other than increased IgG subclass 3)  Cultures: 3/10322 - pending 11/23/2019-MAI 05/17/2019-MAI 03/15/2019-MAI 01/10/2019 MAI 11/07/2018-MAI 10/10/2018-MAI  08/22/2018-MAI 07/11/2018-MAI 05/31/2018-MAI MAI susceptibilities-resistant clarithromycin, linezolid.  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  Susceptible to amikacin, cefepime, clarithromycin, imipenem, linezolid, trimethoprim sulfamethoxazole. 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  Tree-in-bud opacities diffusely.  Mucus impaction in airways.   Pulmonary Functions Testing Results: PFT Results Latest Ref Rng & Units 04/18/2018  FVC-Pre L 2.08  FVC-Predicted Pre % 79  Pre FEV1/FVC % % 75   FEV1-Pre L 1.57  FEV1-Predicted Pre % 80   2019-no significant obstruction, mildly reduced FVC.     Assessment & Plan:   No diagnosis found.   Dyspnea on exertion likely due to chronic bronchiectasis-improved on LAMA/LABA; worsed when back off these meds. Component of deconditioning.  Has seasonal allergies, describes bronchospasm, possible asthma. 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  No obvious immunodeficiencies.  Progressive on CT over the last 6 years.  No evidence of hypersensitivity pneumonitis from inhaled amikacin on CT scan.  With ongoing worsening cough and poor appetite.  Concerning for exacerbation. - Increase hypertonic saline to twice a day during exacerbation and continued twice daily usage (daily currently) Augmentin x14 days prescribed today -Continue current 4 drug MAI therapy and ID follow-up.  I agree with Dr. Megan Salon that she has limited treatment options and would likely progress faster off meds.  -Up-to-date on Covid, pneumonia.   Weight loss- concerned this may be a MAC secondary effect, complicated by fatigue and DOE.  Related to metabolic needs from dyspnea, cough.  Poor appetite.  This is ongoing. -Con't MAC 4-drug therapy for now as these meds to not have an obvious precipitating effect   RTC in 3 months with Dr. Silas Flood.     Current Outpatient Medications:    acyclovir (ZOVIRAX) 400 MG tablet, Take 400 mg by mouth 2 (two) times daily., Disp: , Rfl:    Amikacin Sulfate Liposome (ARIKAYCE) 590 MG/8.4ML SUSP, Inhale the contents of one vial (590 mg) via Lamira device 1 time daily as directed., Disp: 235.2 mL, Rfl: 28   amoxicillin-clavulanate (AUGMENTIN) 875-125 MG tablet, Take 1 tablet by mouth 2 (two) times daily  for 14 days., Disp: 28 tablet, Rfl: 0   ascorbic acid (VITAMIN C) 500 MG tablet, Take 500 mg by mouth daily., Disp: , Rfl:    atorvastatin (LIPITOR) 20 MG tablet, Take 20 mg by mouth daily., Disp: , Rfl:    Azelastine HCl 137 MCG/SPRAY SOLN, , Disp: , Rfl:  B Complex Vitamins (B COMPLEX-B12 PO), Take 1 tablet by mouth daily., Disp: , Rfl:    benzonatate (TESSALON PERLES) 100 MG capsule, Take 1 capsule (100 mg total) by mouth 3 (three) times daily as needed for cough., Disp: 90 capsule, Rfl: 5   calcium carbonate (OSCAL) 1500 (600 Ca) MG TABS tablet, Take 600 mg of elemental calcium by mouth 2 (two) times daily with a meal., Disp: , Rfl:    cetirizine (ZYRTEC) 10 MG tablet, every morning., Disp: , Rfl:    Cholecalciferol (VITAMIN D3) 125 MCG (5000 UT) TABS, Take 1 tablet by mouth., Disp: , Rfl:    CLOFAZIMINE PO, Take 2 tablets by mouth daily., Disp: , Rfl:    ethambutol (MYAMBUTOL) 400 MG tablet, TAKE 3 TABLETS EVERY DAY, Disp: 270 tablet, Rfl: 3   Flaxseed, Linseed, (FLAX SEED OIL) 1300 MG CAPS, Take by mouth daily., Disp: , Rfl:    Fluticasone-Umeclidin-Vilant (TRELEGY ELLIPTA) 100-62.5-25 MCG/INH AEPB, Inhale 1 puff into the lungs daily., Disp: 60 each, Rfl: 5   Melatonin 10 MG CAPS, 10 mg nightly as needed., Disp: , Rfl:    omeprazole (PRILOSEC) 40 MG capsule, TAKE 1 CAPSULE TWICE DAILY, Disp: 180 capsule, Rfl: 1   Probiotic Product (PROBIOTIC DAILY PO), Take 1 capsule by mouth daily., Disp: , Rfl:    Respiratory Therapy Supplies (FLUTTER) DEVI, Use as directed., Disp: 1 each, Rfl: 0   rifampin (RIFADIN) 300 MG capsule, Take 2 capsules (600 mg total) by mouth daily., Disp: 180 capsule, Rfl: 3   sodium chloride HYPERTONIC 3 % nebulizer solution, INHALE THE CONTENTS OF 1 VIAL ( 4ML ) VIA NEBULIZER TWICE DAILY, Disp: 720 mL, Rfl: 3   Spacer/Aero-Holding Chambers (AEROCHAMBER MV) inhaler, Use as instructed, Disp: 1 each, Rfl: 2   TURMERIC PO, Take 2,000 mg by mouth daily. , Disp: , Rfl:     zinc gluconate 50 MG tablet, Take 50 mg by mouth daily., Disp: , Rfl:

## 2021-08-12 ENCOUNTER — Other Ambulatory Visit (HOSPITAL_COMMUNITY): Payer: Self-pay

## 2021-08-12 ENCOUNTER — Encounter: Payer: Self-pay | Admitting: Internal Medicine

## 2021-08-12 ENCOUNTER — Other Ambulatory Visit: Payer: Self-pay

## 2021-08-12 ENCOUNTER — Ambulatory Visit: Payer: Medicare HMO | Admitting: Internal Medicine

## 2021-08-12 DIAGNOSIS — R634 Abnormal weight loss: Secondary | ICD-10-CM

## 2021-08-12 DIAGNOSIS — A310 Pulmonary mycobacterial infection: Secondary | ICD-10-CM

## 2021-08-12 DIAGNOSIS — F329 Major depressive disorder, single episode, unspecified: Secondary | ICD-10-CM

## 2021-08-12 MED ORDER — DRONABINOL 2.5 MG PO CAPS: 2.5000 mg | ORAL_CAPSULE | Freq: Two times a day (BID) | ORAL | 5 refills | Status: DC

## 2021-08-12 NOTE — Progress Notes (Signed)
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  Previous Medications   ACYCLOVIR (ZOVIRAX) 400 MG TABLET    Take 400 mg by mouth 2 (two) times daily.   AMIKACIN SULFATE LIPOSOME (ARIKAYCE) 590 MG/8.4ML SUSP    Inhale the contents of one vial (590 mg) via Lamira device 1 time daily as directed.   AMOXICILLIN-CLAVULANATE (AUGMENTIN) 875-125 MG TABLET    Take 1 tablet by mouth 2 (two) times daily for 14 days.   ASCORBIC ACID (VITAMIN C) 500 MG TABLET    Take 500 mg by mouth daily.   ATORVASTATIN (LIPITOR) 20 MG TABLET    Take 20 mg by mouth daily.   AZELASTINE HCL 137 MCG/SPRAY SOLN       B COMPLEX VITAMINS (B COMPLEX-B12 PO)    Take 1 tablet by mouth daily.   BENZONATATE (TESSALON PERLES) 100 MG CAPSULE    Take 1 capsule (100 mg total) by mouth 3 (three) times daily as needed for cough.   CALCIUM CARBONATE (OSCAL) 1500 (600 CA) MG TABS TABLET    Take 600 mg of elemental calcium by mouth 2 (two) times daily with a meal.   CETIRIZINE (ZYRTEC) 10 MG TABLET    every morning.   CHOLECALCIFEROL (VITAMIN D3) 125 MCG (5000 UT) TABS    Take 1 tablet by mouth.   CLOFAZIMINE PO    Take 2 tablets by mouth daily.   ETHAMBUTOL (MYAMBUTOL) 400 MG TABLET    TAKE 3 TABLETS EVERY DAY   FLAXSEED, LINSEED, (FLAX SEED  OIL) 1300 MG CAPS    Take by mouth daily.   FLUTICASONE-UMECLIDIN-VILANT (TRELEGY ELLIPTA) 100-62.5-25 MCG/INH AEPB    Inhale 1 puff into the lungs daily.   MELATONIN 10 MG CAPS    10 mg nightly as needed.   OMEPRAZOLE (PRILOSEC) 40 MG CAPSULE    TAKE 1 CAPSULE TWICE DAILY   PROBIOTIC PRODUCT (PROBIOTIC DAILY PO)    Take 1 capsule by mouth daily.   RESPIRATORY THERAPY SUPPLIES (FLUTTER) DEVI    Use as directed.   RIFAMPIN (RIFADIN) 300 MG CAPSULE    Take 2 capsules (600 mg total) by mouth daily.   SODIUM CHLORIDE HYPERTONIC 3 % NEBULIZER SOLUTION    INHALE THE CONTENTS OF 1 VIAL ( 4ML ) VIA NEBULIZER TWICE DAILY   SPACER/AERO-HOLDING CHAMBERS (AEROCHAMBER MV) INHALER    Use as instructed   TURMERIC PO    Take 2,000 mg by mouth daily.    ZINC GLUCONATE 50 MG TABLET    Take 50 mg by mouth daily.  Modified Medications   No medications on file  Discontinued Medications   No medications on file    Subjective: Kiara Medina is in for her routine follow-up appointment.  She has relapsed Mycobacterium avium pneumonia.  It was first cultured in February 2012.  She was not treated at that time.  She tested positive again in March 2015.  She started on azithromycin, ethambutol and rifampin in December 2015 and completed the course of therapy in November 2016.  She relapsed in June 2018 and started on azithromycin, ethambutol and rifampin again.  She saw Dr. Brigitte Pulse at [facility] in November 2018.  Aerosolized amikacin was added in January 2019  because of evolving resistance.  In September 2019 azithromycin was changed to clofazimine and she continued ethambutol, rifampin and aerosolized amikacin.  She has tolerated her current antibiotics fairly well.  She was recently started on amoxicillin clavulanate by her pulmonologist because of an exacerbation of her cough.  She feels like it has helped a little bit.    She has been doing pulmonary rehab and feels like that is helping some.  Unfortunately, she still has  very poor appetite and intermittent nausea and she continues to lose weight.  Review of Systems: Review of Systems  Constitutional:  Positive for diaphoresis, malaise/fatigue and weight loss. Negative for chills and fever.  Respiratory:  Positive for cough, sputum production and shortness of breath. Negative for hemoptysis.   Cardiovascular:  Negative for chest pain.  Gastrointestinal:  Positive for nausea. Negative for abdominal pain, diarrhea, heartburn and vomiting.  Skin:  Negative for rash.   Past Medical History:  Diagnosis Date   Allergic rhinitis, cause unspecified    Bronchiectasis without acute exacerbation (HCC)    Cough    Sinusitis     Social History   Tobacco Use   Smoking status: Former    Packs/day: 1.00    Years: 20.00    Pack years: 20.00    Types: Cigarettes    Quit date: 12/14/1985    Years since quitting: 35.6   Smokeless tobacco: Never  Vaping Use   Vaping Use: Never used  Substance Use Topics   Alcohol use: Not Currently    Alcohol/week: 1.0 standard drink    Types: 1 Glasses of wine per week    Comment: occ   Drug use: No    Family History  Problem Relation Age of Onset   Emphysema Sister    Stroke Mother    Clotting disorder Mother     No Known Allergies  Objective: Vitals:   08/12/21 1347  BP: 128/77  Pulse: (!) 107  Temp: (!) 97.5 F (36.4 C)  TempSrc: Oral  SpO2: 96%  Weight: 103 lb (46.7 kg)  Height: '5\' 3"'$  (1.6 m)   Body mass index is 18.25 kg/m.  Physical Exam Constitutional:      Comments: She is in good spirits.  Her weight is down 21 pounds in the past year.  Cardiovascular:     Rate and Rhythm: Normal rate and regular rhythm.     Heart sounds: No murmur heard. Pulmonary:     Effort: Pulmonary effort is normal.     Breath sounds: No wheezing, rhonchi or rales.     Comments: Deep breathing induces coughing. Abdominal:     Palpations: Abdomen is soft.     Tenderness: There is no abdominal tenderness.   Psychiatric:        Mood and Affect: Mood normal.        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  She will follow-up in 3 months.      Unintentional weight loss    Antonieta says that she spoke with her daughter about her poor appetite and weight loss.  Her daughter suggested that she get some marijuana Gummies but Dione has no idea how she would do that.  I am going to give her a trial of oral Marinol to see if that helps with her nausea and poor appetite.      Relevant Medications   dronabinol (MARINOL) 2.5 MG capsule     Unprioritized   Depression    She has mild, chronic depression worsened by her slow progression and weight loss.        Michel Bickers, MD [facility] [phone] pager   [phone] cell 08/12/2021, 2:17 PM

## 2021-08-12 NOTE — Assessment & Plan Note (Signed)
She has mild, chronic depression worsened by her slow progression and weight loss.

## 2021-08-12 NOTE — Assessment & Plan Note (Signed)
Although she remains culture positive and has slowly progressive Mycobacterium avium immune she prefers to continue her current 4 antibiotic regimen.  She will follow-up in 3 months.

## 2021-08-12 NOTE — Assessment & Plan Note (Signed)
Kiara Medina says that she spoke with her daughter about her poor appetite and weight loss.  Her daughter suggested that she get some marijuana Gummies but Kiara Medina has no idea how she would do that.  I am going to give her a trial of oral Marinol to see if that helps with her nausea and poor appetite.

## 2021-08-13 DIAGNOSIS — J471 Bronchiectasis with (acute) exacerbation: Secondary | ICD-10-CM | POA: Diagnosis not present

## 2021-08-13 DIAGNOSIS — J479 Bronchiectasis, uncomplicated: Secondary | ICD-10-CM | POA: Diagnosis not present

## 2021-08-13 DIAGNOSIS — A310 Pulmonary mycobacterial infection: Secondary | ICD-10-CM | POA: Diagnosis not present

## 2021-08-15 DIAGNOSIS — A319 Mycobacterial infection, unspecified: Secondary | ICD-10-CM | POA: Diagnosis not present

## 2021-08-15 DIAGNOSIS — J471 Bronchiectasis with (acute) exacerbation: Secondary | ICD-10-CM | POA: Diagnosis not present

## 2021-08-19 DIAGNOSIS — A319 Mycobacterial infection, unspecified: Secondary | ICD-10-CM | POA: Diagnosis not present

## 2021-08-19 DIAGNOSIS — J471 Bronchiectasis with (acute) exacerbation: Secondary | ICD-10-CM | POA: Diagnosis not present

## 2021-08-20 DIAGNOSIS — A319 Mycobacterial infection, unspecified: Secondary | ICD-10-CM | POA: Diagnosis not present

## 2021-08-20 DIAGNOSIS — J471 Bronchiectasis with (acute) exacerbation: Secondary | ICD-10-CM | POA: Diagnosis not present

## 2021-08-22 DIAGNOSIS — J471 Bronchiectasis with (acute) exacerbation: Secondary | ICD-10-CM | POA: Diagnosis not present

## 2021-08-22 DIAGNOSIS — A319 Mycobacterial infection, unspecified: Secondary | ICD-10-CM | POA: Diagnosis not present

## 2021-08-25 DIAGNOSIS — J471 Bronchiectasis with (acute) exacerbation: Secondary | ICD-10-CM | POA: Diagnosis not present

## 2021-08-25 DIAGNOSIS — A319 Mycobacterial infection, unspecified: Secondary | ICD-10-CM | POA: Diagnosis not present

## 2021-08-27 ENCOUNTER — Telehealth: Payer: Self-pay | Admitting: Pulmonary Disease

## 2021-08-27 DIAGNOSIS — R0609 Other forms of dyspnea: Secondary | ICD-10-CM

## 2021-08-27 DIAGNOSIS — R0600 Dyspnea, unspecified: Secondary | ICD-10-CM

## 2021-08-27 DIAGNOSIS — J471 Bronchiectasis with (acute) exacerbation: Secondary | ICD-10-CM | POA: Diagnosis not present

## 2021-08-27 DIAGNOSIS — J479 Bronchiectasis, uncomplicated: Secondary | ICD-10-CM

## 2021-08-27 DIAGNOSIS — R062 Wheezing: Secondary | ICD-10-CM

## 2021-08-27 DIAGNOSIS — A319 Mycobacterial infection, unspecified: Secondary | ICD-10-CM | POA: Diagnosis not present

## 2021-08-27 NOTE — Telephone Encounter (Signed)
[place] pt is currently doing pulmonary rehab in [place] and would like to continue taking this as she has seen an improvement.  Ok to send in an order to continue this?  Thanks

## 2021-08-28 ENCOUNTER — Telehealth: Payer: Self-pay | Admitting: Pulmonary Disease

## 2021-08-28 NOTE — Telephone Encounter (Signed)
Yes please send order to continue pulmonary rehab.

## 2021-08-28 NOTE — Telephone Encounter (Signed)
LM making Aissatou aware new order has been placed just waiting the physician to sign off.   Nothing further needed at this time.

## 2021-08-29 DIAGNOSIS — A319 Mycobacterial infection, unspecified: Secondary | ICD-10-CM | POA: Diagnosis not present

## 2021-08-29 DIAGNOSIS — J471 Bronchiectasis with (acute) exacerbation: Secondary | ICD-10-CM | POA: Diagnosis not present

## 2021-09-01 ENCOUNTER — Telehealth: Payer: Self-pay

## 2021-09-01 DIAGNOSIS — J471 Bronchiectasis with (acute) exacerbation: Secondary | ICD-10-CM | POA: Diagnosis not present

## 2021-09-01 DIAGNOSIS — A319 Mycobacterial infection, unspecified: Secondary | ICD-10-CM | POA: Diagnosis not present

## 2021-09-01 NOTE — Telephone Encounter (Addendum)
[facility] Patient Advocate Encounter  I have called [employer] and they have stated Arikayce is covered until 12/13/21 which after 12/13/21 I will need to request another PA for the following year.  Ileene Patrick, [facility] Patient [facility] Phone: [phone] Fax:  [phone]

## 2021-09-10 DIAGNOSIS — M79604 Pain in right leg: Secondary | ICD-10-CM | POA: Diagnosis not present

## 2021-09-11 ENCOUNTER — Other Ambulatory Visit: Payer: Self-pay | Admitting: Pulmonary Disease

## 2021-09-15 DIAGNOSIS — M5416 Radiculopathy, lumbar region: Secondary | ICD-10-CM | POA: Diagnosis not present

## 2021-09-16 DIAGNOSIS — M5117 Intervertebral disc disorders with radiculopathy, lumbosacral region: Secondary | ICD-10-CM | POA: Diagnosis not present

## 2021-09-16 DIAGNOSIS — M5116 Intervertebral disc disorders with radiculopathy, lumbar region: Secondary | ICD-10-CM | POA: Diagnosis not present

## 2021-09-16 DIAGNOSIS — M5416 Radiculopathy, lumbar region: Secondary | ICD-10-CM | POA: Diagnosis not present

## 2021-09-16 DIAGNOSIS — M48061 Spinal stenosis, lumbar region without neurogenic claudication: Secondary | ICD-10-CM | POA: Diagnosis not present

## 2021-09-18 ENCOUNTER — Other Ambulatory Visit: Payer: Self-pay

## 2021-09-18 ENCOUNTER — Inpatient Hospital Stay (HOSPITAL_BASED_OUTPATIENT_CLINIC_OR_DEPARTMENT_OTHER)
Admission: EM | Admit: 2021-09-18 | Discharge: 2021-09-28 | DRG: 388 | Disposition: A | Discharge status: Home / Self Care | Payer: Medicare HMO | Attending: Internal Medicine | Admitting: Internal Medicine

## 2021-09-18 ENCOUNTER — Emergency Department (HOSPITAL_BASED_OUTPATIENT_CLINIC_OR_DEPARTMENT_OTHER): Payer: Medicare HMO

## 2021-09-18 ENCOUNTER — Encounter (HOSPITAL_BASED_OUTPATIENT_CLINIC_OR_DEPARTMENT_OTHER): Payer: Self-pay | Admitting: Emergency Medicine

## 2021-09-18 DIAGNOSIS — K56609 Unspecified intestinal obstruction, unspecified as to partial versus complete obstruction: Secondary | ICD-10-CM | POA: Diagnosis present

## 2021-09-18 DIAGNOSIS — Z90710 Acquired absence of both cervix and uterus: Secondary | ICD-10-CM

## 2021-09-18 DIAGNOSIS — Z79899 Other long term (current) drug therapy: Secondary | ICD-10-CM

## 2021-09-18 DIAGNOSIS — Z792 Long term (current) use of antibiotics: Secondary | ICD-10-CM | POA: Diagnosis not present

## 2021-09-18 DIAGNOSIS — Z9851 Tubal ligation status: Secondary | ICD-10-CM

## 2021-09-18 DIAGNOSIS — J479 Bronchiectasis, uncomplicated: Secondary | ICD-10-CM | POA: Diagnosis present

## 2021-09-18 DIAGNOSIS — E785 Hyperlipidemia, unspecified: Secondary | ICD-10-CM | POA: Diagnosis present

## 2021-09-18 DIAGNOSIS — Z5320 Procedure and treatment not carried out because of patient's decision for unspecified reasons: Secondary | ICD-10-CM | POA: Diagnosis present

## 2021-09-18 DIAGNOSIS — K567 Ileus, unspecified: Secondary | ICD-10-CM | POA: Diagnosis not present

## 2021-09-18 DIAGNOSIS — F329 Major depressive disorder, single episode, unspecified: Secondary | ICD-10-CM | POA: Diagnosis not present

## 2021-09-18 DIAGNOSIS — K5669 Other partial intestinal obstruction: Secondary | ICD-10-CM | POA: Diagnosis not present

## 2021-09-18 DIAGNOSIS — F419 Anxiety disorder, unspecified: Secondary | ICD-10-CM | POA: Diagnosis present

## 2021-09-18 DIAGNOSIS — Z681 Body mass index (BMI) 19 or less, adult: Secondary | ICD-10-CM

## 2021-09-18 DIAGNOSIS — D72829 Elevated white blood cell count, unspecified: Secondary | ICD-10-CM | POA: Diagnosis not present

## 2021-09-18 DIAGNOSIS — K5651 Intestinal adhesions [bands], with partial obstruction: Principal | ICD-10-CM | POA: Diagnosis present

## 2021-09-18 DIAGNOSIS — E871 Hypo-osmolality and hyponatremia: Secondary | ICD-10-CM | POA: Diagnosis not present

## 2021-09-18 DIAGNOSIS — M545 Low back pain, unspecified: Secondary | ICD-10-CM | POA: Diagnosis not present

## 2021-09-18 DIAGNOSIS — Z4682 Encounter for fitting and adjustment of non-vascular catheter: Secondary | ICD-10-CM | POA: Diagnosis not present

## 2021-09-18 DIAGNOSIS — Z8619 Personal history of other infectious and parasitic diseases: Secondary | ICD-10-CM | POA: Diagnosis not present

## 2021-09-18 DIAGNOSIS — K6389 Other specified diseases of intestine: Secondary | ICD-10-CM | POA: Diagnosis not present

## 2021-09-18 DIAGNOSIS — E43 Unspecified severe protein-calorie malnutrition: Secondary | ICD-10-CM | POA: Diagnosis present

## 2021-09-18 DIAGNOSIS — Z0189 Encounter for other specified special examinations: Secondary | ICD-10-CM

## 2021-09-18 DIAGNOSIS — Z4659 Encounter for fitting and adjustment of other gastrointestinal appliance and device: Secondary | ICD-10-CM

## 2021-09-18 DIAGNOSIS — A310 Pulmonary mycobacterial infection: Secondary | ICD-10-CM | POA: Diagnosis not present

## 2021-09-18 DIAGNOSIS — K219 Gastro-esophageal reflux disease without esophagitis: Secondary | ICD-10-CM | POA: Diagnosis not present

## 2021-09-18 DIAGNOSIS — M5410 Radiculopathy, site unspecified: Secondary | ICD-10-CM | POA: Diagnosis present

## 2021-09-18 DIAGNOSIS — E861 Hypovolemia: Secondary | ICD-10-CM | POA: Diagnosis present

## 2021-09-18 DIAGNOSIS — Z9049 Acquired absence of other specified parts of digestive tract: Secondary | ICD-10-CM | POA: Diagnosis not present

## 2021-09-18 DIAGNOSIS — Z79890 Hormone replacement therapy (postmenopausal): Secondary | ICD-10-CM | POA: Diagnosis not present

## 2021-09-18 DIAGNOSIS — E781 Pure hyperglyceridemia: Secondary | ICD-10-CM | POA: Diagnosis not present

## 2021-09-18 DIAGNOSIS — K5939 Other megacolon: Secondary | ICD-10-CM | POA: Diagnosis not present

## 2021-09-18 DIAGNOSIS — Z87891 Personal history of nicotine dependence: Secondary | ICD-10-CM

## 2021-09-18 DIAGNOSIS — R110 Nausea: Secondary | ICD-10-CM | POA: Diagnosis not present

## 2021-09-18 DIAGNOSIS — Z8709 Personal history of other diseases of the respiratory system: Secondary | ICD-10-CM | POA: Diagnosis not present

## 2021-09-18 DIAGNOSIS — R109 Unspecified abdominal pain: Secondary | ICD-10-CM | POA: Diagnosis not present

## 2021-09-18 DIAGNOSIS — Z20822 Contact with and (suspected) exposure to covid-19: Secondary | ICD-10-CM | POA: Diagnosis not present

## 2021-09-18 DIAGNOSIS — R1110 Vomiting, unspecified: Secondary | ICD-10-CM | POA: Diagnosis not present

## 2021-09-18 DIAGNOSIS — F32A Depression, unspecified: Secondary | ICD-10-CM | POA: Diagnosis present

## 2021-09-18 LAB — CBC
HCT: 42.4 % (ref 36.0–46.0)
Hemoglobin: 14.3 g/dL (ref 12.0–15.0)
MCH: 30.7 pg (ref 26.0–34.0)
MCHC: 33.7 g/dL (ref 30.0–36.0)
MCV: 91 fL (ref 80.0–100.0)
Platelets: 376 10*3/uL (ref 150–400)
RBC: 4.66 MIL/uL (ref 3.87–5.11)
RDW: 15.3 % (ref 11.5–15.5)
WBC: 12.4 10*3/uL — ABNORMAL HIGH (ref 4.0–10.5)
nRBC: 0 % (ref 0.0–0.2)

## 2021-09-18 LAB — URINALYSIS, ROUTINE W REFLEX MICROSCOPIC
Bilirubin Urine: NEGATIVE
Glucose, UA: NEGATIVE mg/dL
Hgb urine dipstick: NEGATIVE
Ketones, ur: NEGATIVE mg/dL
Leukocytes,Ua: NEGATIVE
Nitrite: NEGATIVE
Protein, ur: NEGATIVE mg/dL
Specific Gravity, Urine: 1.005 (ref 1.005–1.030)
pH: 6 (ref 5.0–8.0)

## 2021-09-18 LAB — COMPREHENSIVE METABOLIC PANEL
ALT: 20 U/L (ref 0–44)
AST: 25 U/L (ref 15–41)
Albumin: 3.8 g/dL (ref 3.5–5.0)
Alkaline Phosphatase: 97 U/L (ref 38–126)
Anion gap: 10 (ref 5–15)
BUN: 12 mg/dL (ref 8–23)
CO2: 27 mmol/L (ref 22–32)
Calcium: 9.5 mg/dL (ref 8.9–10.3)
Chloride: 93 mmol/L — ABNORMAL LOW (ref 98–111)
Creatinine, Ser: 0.64 mg/dL (ref 0.44–1.00)
GFR, Estimated: >60 mL/min (ref >60)
Glucose, Bld: 120 mg/dL — ABNORMAL HIGH (ref 70–99)
Potassium: 3.5 mmol/L (ref 3.5–5.1)
Sodium: 130 mmol/L — ABNORMAL LOW (ref 135–145)
Total Bilirubin: 1.3 mg/dL — ABNORMAL HIGH (ref 0.3–1.2)
Total Protein: 7.3 g/dL (ref 6.5–8.1)

## 2021-09-18 LAB — RESP PANEL BY RT-PCR (FLU A&B, COVID) ARPGX2
Influenza A by PCR: NEGATIVE
Influenza B by PCR: NEGATIVE
SARS Coronavirus 2 by RT PCR: NEGATIVE

## 2021-09-18 LAB — LIPASE, BLOOD: Lipase: 30 U/L (ref 11–51)

## 2021-09-18 MED ORDER — HYDROMORPHONE HCL 1 MG/ML IJ SOLN
0.5000 mg | INTRAMUSCULAR | Status: DC | PRN
  Administered 2021-09-18 – 2021-09-24 (×21): 1 mg via INTRAVENOUS
  Filled 2021-09-18 (×22): qty 1

## 2021-09-18 MED ORDER — MORPHINE SULFATE (PF) 2 MG/ML IV SOLN
2.0000 mg | Freq: Once | INTRAVENOUS | Status: AC
Start: 2021-09-18 — End: 2021-09-18
  Administered 2021-09-18: 2 mg via INTRAVENOUS
  Filled 2021-09-18: qty 1

## 2021-09-18 MED ORDER — MORPHINE SULFATE (PF) 4 MG/ML IV SOLN
4.0000 mg | INTRAVENOUS | Status: DC | PRN
  Administered 2021-09-18 – 2021-09-26 (×2): 4 mg via INTRAVENOUS
  Filled 2021-09-18 (×3): qty 1

## 2021-09-18 MED ORDER — BENZOCAINE 20 % MT AERO
INHALATION_SPRAY | Freq: Once | OROMUCOSAL | Status: AC
  Administered 2021-09-18: 1 via OROMUCOSAL
  Filled 2021-09-18: qty 57

## 2021-09-18 MED ORDER — IOHEXOL 300 MG/ML  SOLN
100.0000 mL | Freq: Once | INTRAMUSCULAR | Status: AC | PRN
  Administered 2021-09-18: 100 mL via INTRAVENOUS

## 2021-09-18 MED ORDER — SODIUM CHLORIDE 0.9 % IV BOLUS: 1000.0000 mL | Freq: Once | INTRAVENOUS | Status: DC

## 2021-09-18 MED ORDER — ONDANSETRON HCL 4 MG/2ML IJ SOLN
4.0000 mg | Freq: Four times a day (QID) | INTRAMUSCULAR | Status: DC | PRN
  Administered 2021-09-18 – 2021-09-25 (×13): 4 mg via INTRAVENOUS
  Filled 2021-09-18 (×13): qty 2

## 2021-09-18 MED ORDER — ONDANSETRON HCL 4 MG PO TABS: 4.0000 mg | ORAL_TABLET | Freq: Four times a day (QID) | ORAL | Status: DC | PRN

## 2021-09-18 MED ORDER — MORPHINE SULFATE (PF) 4 MG/ML IV SOLN
4.0000 mg | Freq: Once | INTRAVENOUS | Status: AC
Start: 2021-09-18 — End: 2021-09-18
  Administered 2021-09-18: 4 mg via INTRAVENOUS
  Filled 2021-09-18: qty 1

## 2021-09-18 MED ORDER — ENOXAPARIN SODIUM 40 MG/0.4ML IJ SOSY
40.0000 mg | PREFILLED_SYRINGE | INTRAMUSCULAR | Status: DC
  Administered 2021-09-18 – 2021-09-27 (×10): 40 mg via SUBCUTANEOUS
  Filled 2021-09-18 (×10): qty 0.4

## 2021-09-18 MED ORDER — DEXTROSE IN LACTATED RINGERS 5 % IV SOLN
INTRAVENOUS | Status: DC
  Administered 2021-09-21: 1000 mL via INTRAVENOUS

## 2021-09-18 MED ORDER — SODIUM CHLORIDE 0.9 % IV SOLN: INTRAVENOUS | Status: DC | PRN

## 2021-09-18 MED ORDER — SODIUM CHLORIDE 0.9 % IV BOLUS
1000.0000 mL | Freq: Once | INTRAVENOUS | Status: AC
  Administered 2021-09-18: 1000 mL via INTRAVENOUS

## 2021-09-18 MED ORDER — ONDANSETRON HCL 4 MG/2ML IJ SOLN
4.0000 mg | Freq: Once | INTRAMUSCULAR | Status: AC
  Administered 2021-09-18: 4 mg via INTRAVENOUS
  Filled 2021-09-18: qty 2

## 2021-09-18 MED ORDER — SODIUM CHLORIDE 0.9 % IV SOLN: Freq: Once | INTRAVENOUS | Status: AC

## 2021-09-18 NOTE — Telephone Encounter (Signed)
Noted  

## 2021-09-18 NOTE — ED Triage Notes (Signed)
Pt sent here by PCP for abdominal pain with constipation, N & V.  No BM x 2 days.  Pain radiates to back.

## 2021-09-18 NOTE — ED Notes (Signed)
Transferred by [facility].

## 2021-09-18 NOTE — ED Provider Notes (Signed)
Gen abd pain, n,v. Getting CT scan. Hx of MAC and on chronic abx. 2 days w/ no BM. Usually goes multiple times a day. Physical Exam  BP (!) 144/71 (BP Location: Left Arm)   Pulse 84   Temp 97.9 F (36.6 C) (Oral)   Resp 18   Ht _0  (1.6 m)   Wt 47.6 kg   SpO2 98%   BMI 18.60 kg/m   Physical Exam Constitutional:      General: She is not in acute distress.    Appearance: She is not ill-appearing, toxic-appearing or diaphoretic.  Pulmonary:     Effort: Pulmonary effort is normal.     Breath sounds: Normal breath sounds.  Abdominal:     Tenderness: There is generalized abdominal tenderness.  Neurological:     Mental Status: She is alert.  Psychiatric:        Mood and Affect: Mood normal.        Behavior: Behavior normal.    ED Course/Procedures     Procedures  MDM  CT scan shows small bowel obstruction.  Patient was informed of results and was agreeable to NG tube placement and admission to hospital.  While awaiting transport, additional morphine and Zofran given as needed.  Patient was transported in stable condition.       Godfrey Pick, MD 09/19/21 [phone]

## 2021-09-18 NOTE — ED Notes (Signed)
Report called to Maudie Mercury, Therapist, sports at [facility]

## 2021-09-18 NOTE — Progress Notes (Signed)
Yes. I need to know if that medication sent to pharmacy for storage can be administered for patient. She is in pain from her back and methylprednisolone is what was prescribed for her for that reason. She is at [facility] on [address] floor room 1612. I have reached out to hospitalist and the hospitalist told me to contact attending.The admission nurse has done her admission and the pharmacy tech has reviewed her medications. Her room phone number is [phone]. The hospitalist I reached out to with the same concerns in Kiara Screws, NP  Patient has home medications that she takes nightly rifampin 300 mg capsule, and Clofazimine 50 mg capsule for MAC lung disease. She also had ventolin HFA inhaler PRN, all has been sent to pharmacy for storage. Not sure if you wanted to order those medications for patient. She also had a prescription sent to [facility] after results of an MRI of her discs in her back. She stated that she received a text for methylprednisolone and wanted to know if this will be administered during her admission. Thank you   These messages were sent via secure chat. Was directed to contact attending in which shows Dr. Loleta Books. Admitting doctor Opyd has been notified as well after properly notified that he should have been contacted initially.

## 2021-09-18 NOTE — ED Notes (Signed)
Pt. Calm, resting quietly, no epistaxis.

## 2021-09-18 NOTE — ED Notes (Signed)
Attempted NGT placement L nare. Pt unable to tolerate due to discomfort. Small amount of clotted blood from L nare post placement attempt.

## 2021-09-18 NOTE — H&P (Signed)
History and Physical   Kiara Medina LNL:[id number] DOB: [birth year] DOA: 09/18/2021  Referring MD/NP/PA: Godfrey Pick, MD  PCP: Nicoletta Dress, MD   Outpatient Specialists: Dr. Michel Bickers infectious disease  Patient coming from: Home  Chief Complaint: Abdominal pain nausea vomiting  HPI: Kiara Medina is a 75 y.o. female with medical history significant of patient is 75 year old with history of Mycobacterium avium complex pneumonia, chronic antibiotic suppressive therapy, status postcholecystectomy, status post appendectomy, status post tubal ligation, history of allergy and bronchiectasis who presented to the ER with abdominal pain, decreased bowel movements, burping a lot.  Patient's last bowel movement is at least 2 days ago.  She has been having significant nausea vomiting and she is unable to keep anything down.  The vomiting is clear nonbilious.  Patient was seen in the ER and evaluated.  She appears to have small bowel obstruction.  She was sent over to the hospital from [facility] for admission.  Patient with multiple abdominal surgeries but no prior small bowel obstructions..  ED Course: Temperature 97.9 blood pressure 150/76, pulse 107 respirate of 26 oxygen sat 96% on room air.  Sodium is 130, potassium 3.5 chloride 93, CO2 27 glucose 120.  BUN 12 creatinine 0.64.  LFTs within normal.  White count 12.4 the rest of the CBC within normal.  Urinalysis is negative. Influenza and COVID-19 negative.  CT abdomen pelvis shows distal small bowel obstruction with transition point in the terminal ileum.  This is highly suspicious for adhesions.  Surgery consulted and patient admitted to the medical service for management Review of Systems: As per HPI otherwise 10 point review of systems negative.    Past Medical History:  Diagnosis Date   Allergic rhinitis, cause unspecified    Bronchiectasis without acute exacerbation (HCC)    Cough    Sinusitis     Past Surgical History:   Procedure Laterality Date   APPENDECTOMY     CATARACT EXTRACTION  08/2013   CHOLECYSTECTOMY     TUBAL LIGATION     VESICOVAGINAL FISTULA CLOSURE W/ TAH       reports that she quit smoking about 35 years ago. Her smoking use included cigarettes. She has a 20.00 pack-year smoking history. She has never used smokeless tobacco. She reports that she does not currently use alcohol after a past usage of about 1.0 standard drink per week. She reports that she does not use drugs.  No Known Allergies  Family History  Problem Relation Age of Onset   Emphysema Sister    Stroke Mother    Clotting disorder Mother      Prior to Admission medications   Medication Sig Start Date End Date Taking? Authorizing Provider  acetaminophen (TYLENOL) 500 MG tablet Take 1,000 mg by mouth every 6 (six) hours as needed for moderate pain.   Yes [provider]  acyclovir (ZOVIRAX) 400 MG tablet Take 400 mg by mouth 2 (two) times daily.   Yes [provider]  albuterol (VENTOLIN HFA) 108 (90 Base) MCG/ACT inhaler Inhale 2 puffs into the lungs every 6 (six) hours as needed for wheezing or shortness of breath.   Yes [provider]  Amikacin Sulfate Liposome (ARIKAYCE) 590 MG/8.4ML SUSP Inhale the contents of one vial (590 mg) via Lamira device 1 time daily as directed. Patient taking differently: Inhale 590 mg into the lungs daily. Inhale the contents of one vial (590 mg) via Lamira device 1 time daily as directed. 01/29/21  Yes Michel Bickers, MD  ascorbic acid (VITAMIN C) 500 MG tablet Take 500 mg by mouth daily.   Yes [provider]  atorvastatin (LIPITOR) 20 MG tablet Take 20 mg by mouth daily.   Yes [provider]  B Complex Vitamins (B COMPLEX-B12 PO) Take 1 tablet by mouth daily.   Yes [provider]  benzonatate (TESSALON PERLES) 100 MG capsule Take 1 capsule (100 mg total) by mouth 3 (three) times daily as needed for cough. 03/01/20  Yes Michel Bickers,  MD  calcium carbonate (OSCAL) 1500 (600 Ca) MG TABS tablet Take 600 mg of elemental calcium by mouth 2 (two) times daily with a meal.   Yes [provider]  cetirizine (ZYRTEC) 10 MG tablet Take 10 mg by mouth daily.   Yes [provider]  Cholecalciferol (VITAMIN D3) 125 MCG (5000 UT) TABS Take 1 tablet by mouth daily.   Yes [provider]  CLOFAZIMINE PO Take 100 mg by mouth daily.   Yes [provider]  diazepam (VALIUM) 10 MG tablet Take 10 mg by mouth every 6 (six) hours as needed for anxiety (For MRI).   Yes [provider]  dronabinol (MARINOL) 2.5 MG capsule Take 1 capsule (2.5 mg total) by mouth 2 (two) times daily before a meal. 08/12/21  Yes Michel Bickers, MD  ethambutol (MYAMBUTOL) 400 MG tablet TAKE 3 TABLETS EVERY DAY Patient taking differently: Take 1,200 mg by mouth daily. 01/21/21  Yes Michel Bickers, MD  Flaxseed, Linseed, (FLAX SEED OIL) 1300 MG CAPS Take 1 tablet by mouth daily.   Yes [provider]  Fluticasone-Umeclidin-Vilant (TRELEGY ELLIPTA) 100-62.5-25 MCG/INH AEPB Inhale 1 puff into the lungs daily. 03/17/21  Yes Hunsucker, Bonna Gains, MD  Melatonin 10 MG CAPS Take 10 mg by mouth at bedtime.   Yes [provider]  omeprazole (PRILOSEC) 40 MG capsule TAKE 1 CAPSULE TWICE DAILY Patient taking differently: Take 40 mg by mouth daily. 09/11/21  Yes Hunsucker, Bonna Gains, MD  Probiotic Product (PROBIOTIC DAILY PO) Take 1 capsule by mouth daily.   Yes [provider]  Propylene Glycol (SYSTANE BALANCE OP) Place 1 drop into the left eye daily as needed (dry eyes).   Yes [provider]  rifampin (RIFADIN) 300 MG capsule Take 2 capsules (600 mg total) by mouth daily. 01/21/21  Yes Michel Bickers, MD  sodium chloride HYPERTONIC 3 % nebulizer solution INHALE THE CONTENTS OF 1 VIAL ( 4ML ) VIA NEBULIZER TWICE DAILY Patient taking differently: Take 4 mLs by nebulization daily. 04/16/21  Yes Hunsucker, Bonna Gains,  MD  TURMERIC PO Take 2,000 mg by mouth daily.    Yes [provider]  zinc gluconate 50 MG tablet Take 50 mg by mouth daily.   Yes [provider]  Respiratory Therapy Supplies (FLUTTER) DEVI Use as directed. 04/19/18   Parrett, Fonnie Mu, NP  Spacer/Aero-Holding Chambers (AEROCHAMBER MV) inhaler Use as instructed 03/24/18   Juanito Doom, MD    Physical Exam: Vitals:   09/18/21 1610 09/18/21 1722 09/18/21 1830 09/18/21 2055  BP: 137/76 (!) 144/78 (!) 150/76 (!) 144/96  Pulse: 86 88 82 85  Resp: _0 Temp:    97.8 F (36.6 C)  TempSrc:    Oral  SpO2: 97% 97% 97% 96%  Weight:      Height:          Constitutional: Acutely ill looking, cachectic, no distress v Vitals:   09/18/21 1610 09/18/21 1722  09/18/21 1830 09/18/21 2055  BP: 137/76 (!) 144/78 (!) 150/76 (!) 144/96  Pulse: 86 88 82 85  Resp: _0 Temp:    97.8 F (36.6 C)  TempSrc:    Oral  SpO2: 97% 97% 97% 96%  Weight:      Height:       Eyes: PERRL, lids and conjunctivae normal ENMT: Mucous membranes are dry. Posterior pharynx clear of any exudate or lesions.Normal dentition.  Neck: normal, supple, no masses, no thyromegaly Respiratory: clear to auscultation bilaterally, no wheezing, no crackles. Normal respiratory effort. No accessory muscle use.  Cardiovascular: Regular rate and rhythm, no murmurs / rubs / gallops. No extremity edema. 2+ pedal pulses. No carotid bruits.  Abdomen: Diffuse tenderness, not distended, no masses palpated. No hepatosplenomegaly. Bowel sounds positive.  Musculoskeletal: no clubbing / cyanosis. No joint deformity upper and lower extremities. Good ROM, no contractures. Normal muscle tone.  Skin: no rashes, lesions, ulcers. No induration Neurologic: CN 2-12 grossly intact. Sensation intact, DTR normal. Strength 5/5 in all 4.  Psychiatric: Normal judgment and insight. Alert and oriented x 3.  Anxious mood.     Labs on Admission: I have personally reviewed  following labs and imaging studies  CBC: Recent Labs  Lab 09/18/21 1236  WBC 12.4*  HGB 14.3  HCT 42.4  MCV 91.0  PLT 335   Basic Metabolic Panel: Recent Labs  Lab 09/18/21 1236  NA 130*  K 3.5  CL 93*  CO2 27  GLUCOSE 120*  BUN 12  CREATININE 0.64  CALCIUM 9.5   GFR: Estimated Creatinine Clearance: 44.3 mL/min (by C-G formula based on SCr of 0.64 mg/dL). Liver Function Tests: Recent Labs  Lab 09/18/21 1236  AST 25  ALT 20  ALKPHOS 97  BILITOT 1.3*  PROT 7.3  ALBUMIN 3.8   Recent Labs  Lab 09/18/21 1236  LIPASE 30   No results for input(s): AMMONIA in the last 168 hours. Coagulation Profile: No results for input(s): INR, PROTIME in the last 168 hours. Cardiac Enzymes: No results for input(s): CKTOTAL, CKMB, CKMBINDEX, TROPONINI in the last 168 hours. BNP (last 3 results) No results for input(s): PROBNP in the last 8760 hours. HbA1C: No results for input(s): HGBA1C in the last 72 hours. CBG: No results for input(s): GLUCAP in the last 168 hours. Lipid Profile: No results for input(s): CHOL, HDL, LDLCALC, TRIG, CHOLHDL, LDLDIRECT in the last 72 hours. Thyroid Function Tests: No results for input(s): TSH, T4TOTAL, FREET4, T3FREE, THYROIDAB in the last 72 hours. Anemia Panel: No results for input(s): VITAMINB12, FOLATE, FERRITIN, TIBC, IRON, RETICCTPCT in the last 72 hours. Urine analysis:    Component Value Date/Time   COLORURINE ORANGE (A) 09/18/2021 1237   APPEARANCEUR CLEAR 09/18/2021 1237   LABSPEC 1.005 09/18/2021 1237   PHURINE 6.0 09/18/2021 1237   GLUCOSEU NEGATIVE 09/18/2021 1237   HGBUR NEGATIVE 09/18/2021 1237   BILIRUBINUR NEGATIVE 09/18/2021 1237   KETONESUR NEGATIVE 09/18/2021 1237   PROTEINUR NEGATIVE 09/18/2021 1237   NITRITE NEGATIVE 09/18/2021 1237   LEUKOCYTESUR NEGATIVE 09/18/2021 1237   Sepsis Labs: _1 (procalcitonin:4,lacticidven:4) ) Recent Results (from the past 240 hour(s))  Resp Panel by RT-PCR (Flu A&B,  Covid) Nasopharyngeal Swab     Status: None   Collection Time: 09/18/21  4:38 PM   Specimen: Nasopharyngeal Swab; Nasopharyngeal(NP) swabs in vial transport medium  Result Value Ref Range Status   SARS Coronavirus 2 by RT PCR NEGATIVE NEGATIVE Final    Comment: (NOTE) SARS-CoV-2 target  nucleic acids are NOT DETECTED.  The SARS-CoV-2 RNA is generally detectable in upper respiratory specimens during the acute phase of infection. The lowest concentration of SARS-CoV-2 viral copies this assay can detect is 138 copies/mL. A negative result does not preclude SARS-Cov-2 infection and should not be used as the sole basis for treatment or other patient management decisions. A negative result may occur with  improper specimen collection/handling, submission of specimen other than nasopharyngeal swab, presence of viral mutation(s) within the areas targeted by this assay, and inadequate number of viral copies(<138 copies/mL). A negative result must be combined with clinical observations, patient history, and epidemiological information. The expected result is Negative.  Fact Sheet for Patients:  EntrepreneurPulse.com.au  Fact Sheet for Healthcare Providers:  IncredibleEmployment.be  This test is no t yet approved or cleared by the [place] FDA and  has been authorized for detection and/or diagnosis of SARS-CoV-2 by FDA under an Emergency Use Authorization (EUA). This EUA will remain  in effect (meaning this test can be used) for the duration of the COVID-19 declaration under Section 564(b)(1) of the Act, 21 U.S.C.section 360bbb-3(b)(1), unless the authorization is terminated  or revoked sooner.       Influenza A by PCR NEGATIVE NEGATIVE Final   Influenza B by PCR NEGATIVE NEGATIVE Final    Comment: (NOTE) The Xpert Xpress SARS-CoV-2/FLU/RSV plus assay is intended as an aid in the diagnosis of influenza from Nasopharyngeal swab specimens and should  not be used as a sole basis for treatment. Nasal washings and aspirates are unacceptable for Xpert Xpress SARS-CoV-2/FLU/RSV testing.  Fact Sheet for Patients: EntrepreneurPulse.com.au  Fact Sheet for Healthcare Providers: IncredibleEmployment.be  This test is not yet approved or cleared by the [place] FDA and has been authorized for detection and/or diagnosis of SARS-CoV-2 by FDA under an Emergency Use Authorization (EUA). This EUA will remain in effect (meaning this test can be used) for the duration of the COVID-19 declaration under Section 564(b)(1) of the Act, 21 U.S.C. section 360bbb-3(b)(1), unless the authorization is terminated or revoked.  Performed at [facility], [address] [place], [place] [address]      Radiological Exams on Admission: CT Abdomen Pelvis W Contrast  Result Date: 09/18/2021 CLINICAL DATA:  Acute generalized abdominal pain. Vomiting. Constipation. Decreased bowel sounds. Prior surgical history of appendectomy and cholecystectomy. EXAM: CT ABDOMEN AND PELVIS WITH CONTRAST TECHNIQUE: Multidetector CT imaging of the abdomen and pelvis was performed using the standard protocol following bolus administration of intravenous contrast. CONTRAST:  148m OMNIPAQUE IOHEXOL 300 MG/ML  SOLN COMPARISON:  06/13/2021 from [facility] Lower Chest: Bronchiectasis, peribronchial thickening, and numerous ill-defined nodular opacities are again seen in both lung bases. A few nodules show central cavitation, and previously seen dominant nodule in the posterior right lower lobe is decreased in size. These findings are consistent with atypical infectious or inflammatory etiology. Hepatobiliary: No hepatic masses identified. Prior cholecystectomy. No evidence of biliary obstruction. Pancreas:  No mass or inflammatory changes. Spleen: Within normal limits in size and appearance. Adrenals/Urinary Tract: No masses  identified. No evidence of ureteral calculi or hydronephrosis. Unremarkable unopacified urinary bladder. Stomach/Bowel: Moderate dilatation of mid and distal small bowel is seen with transition point at the terminal ileum. A stricture is seen involving the terminal ileum, best seen on image 32/5 and 50/6, with mild adjacent mesenteric soft tissue stranding. This is suspicious for adhesion. Mild ascites noted in the pelvis. Vascular/Lymphatic: No pathologically enlarged lymph nodes. No acute vascular  findings. Aortic atherosclerotic calcification noted. Reproductive: Prior hysterectomy noted. Adnexal regions are unremarkable in appearance. Other:  None. Musculoskeletal:  No suspicious bone lesions identified. IMPRESSION: Distal small bowel obstruction, with transition point in the terminal ileum, highly suspicious for adhesion. Mild ascites. Chronic bronchiectasis, peribronchial thickening, and waxing and waning nodular opacities in both lung bases, consistent with atypical infectious or inflammatory etiology. Aortic Atherosclerosis (ICD10-I70.0). Electronically Signed   By: Marlaine Hind M.D.   On: 09/18/2021 15:52      Assessment/Plan Principal Problem:   SBO (small bowel obstruction) (HCC) Active Problems:   BRONCHIECTASIS   Mycobacterium avium-intracellulare complex (HCC)   Dyslipidemia   GERD (gastroesophageal reflux disease)   Depression     #1 small bowel obstruction: Appears to be mechanical in nature due to adhesions.  Patient will be admitted for conservative measures.  Attempted NG tube failed.  We will use small size.  Patient to continue with IV fluids, surgical consult emphasis on conservative management.  Pain control among other things.  #2 history of bronchiectasis: No respiratory symptoms at this point.  Continue to monitor.  #3 Mycobacterium avium intracellulare complex: Continue with ID treatment once oral intake resumes.  #4 hyperlipidemia: Continue with statin  #5 GERD:  Continue with PPIs  #6 depression with anxiety: Continue home regimen once patient is stable.   DVT prophylaxis: Lovenox Code Status: Full code Family Communication: No family at bedside Disposition Plan: Home Consults called: General surgery Admission status: Inpatient  Severity of Illness: The appropriate patient status for this patient is INPATIENT. Inpatient status is judged to be reasonable and necessary in order to provide the required intensity of service to ensure the patient's safety. The patient's presenting symptoms, physical exam findings, and initial radiographic and laboratory data in the context of their chronic comorbidities is felt to place them at high risk for further clinical deterioration. Furthermore, it is not anticipated that the patient will be medically stable for discharge from the hospital within 2 midnights of admission. The following factors support the patient status of inpatient.   " The patient's presenting symptoms include nausea with vomiting abdominal pain. " The worrisome physical exam findings include abdominal tenderness. " The initial radiographic and laboratory data are worrisome because of small bowel obstruction. " The chronic co-morbidities include MAC infection.   * I certify that at the point of admission it is my clinical judgment that the patient will require inpatient hospital care spanning beyond 2 midnights from the point of admission due to high intensity of service, high risk for further deterioration and high frequency of surveillance required.Barbette Merino MD [facility] Pager [phone]  If 7PM-7AM, please contact night-coverage [email or web address] Password [email or web address]  09/18/2021, 9:10 PM

## 2021-09-18 NOTE — ED Notes (Signed)
Report given to [facility]. 

## 2021-09-18 NOTE — ED Notes (Signed)
Patient transported to CT 

## 2021-09-18 NOTE — ED Provider Notes (Addendum)
[facility] EMERGENCY DEPARTMENT Provider Note   CSN: [id number] Arrival date & time: 09/18/21  1204     History Chief Complaint  Patient presents with   Abdominal Pain    Kiara Medina is a 75 y.o. female.  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  Patient states she typically has multiple bowel movements a day.  Her last bowel movement was 2 days ago.  She now feels like she is bloated and uncomfortable and is unable to keep anything down.  Denies any active nausea but has multiple episodes of nonbloody emesis.  No history of obstruction in the past.  Denies any fever but endorses chills.  No genitourinary symptoms.  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     Family History  Problem Relation Age of Onset   Emphysema Sister    Stroke Mother    Clotting disorder Mother     Social History   Tobacco Use   Smoking status: Former     Packs/day: 1.00    Years: 20.00    Pack years: 20.00    Types: Cigarettes    Quit date: 12/14/1985    Years since quitting: 35.7   Smokeless tobacco: Never  Vaping Use   Vaping Use: Never used  Substance Use Topics   Alcohol use: Not Currently    Alcohol/week: 1.0 standard drink    Types: 1 Glasses of wine per week    Comment: occ   Drug use: No    Home Medications Prior to Admission medications   Medication Sig Start Date End Date Taking? Authorizing Provider  acyclovir (ZOVIRAX) 400 MG tablet Take 400 mg by mouth 2 (two) times daily.    [provider]  Amikacin Sulfate Liposome (ARIKAYCE) 590 MG/8.4ML SUSP Inhale the contents of one vial (590 mg) via Lamira device 1 time daily as directed. 01/29/21   Michel Bickers, MD  ascorbic acid (VITAMIN C) 500 MG tablet Take 500 mg by mouth daily.    [provider]  atorvastatin (LIPITOR) 20 MG tablet Take 20 mg by mouth daily.    [provider]  Azelastine HCl 137 MCG/SPRAY SOLN  11/19/20   [provider]  B Complex Vitamins (B COMPLEX-B12 PO) Take 1 tablet by mouth daily.    [provider]  benzonatate (TESSALON PERLES) 100 MG capsule Take 1 capsule (100 mg total) by mouth 3 (three) times daily as needed for cough. 03/01/20  Michel Bickers, MD  calcium carbonate (OSCAL) 1500 (600 Ca) MG TABS tablet Take 600 mg of elemental calcium by mouth 2 (two) times daily with a meal.    [provider]  cetirizine (ZYRTEC) 10 MG tablet every morning.    [provider]  Cholecalciferol (VITAMIN D3) 125 MCG (5000 UT) TABS Take 1 tablet by mouth.    [provider]  CLOFAZIMINE PO Take 2 tablets by mouth daily.    [provider]  dronabinol (MARINOL) 2.5 MG capsule Take 1 capsule (2.5 mg total) by mouth 2 (two) times daily before a meal. 08/12/21   Michel Bickers, MD  ethambutol (MYAMBUTOL) 400 MG tablet TAKE 3 TABLETS EVERY DAY 01/21/21   Michel Bickers, MD  Flaxseed,  Linseed, (FLAX SEED OIL) 1300 MG CAPS Take by mouth daily.    [provider]  Fluticasone-Umeclidin-Vilant (TRELEGY ELLIPTA) 100-62.5-25 MCG/INH AEPB Inhale 1 puff into the lungs daily. 03/17/21   Hunsucker, Bonna Gains, MD  Melatonin 10 MG CAPS 10 mg nightly as needed.    [provider]  omeprazole (PRILOSEC) 40 MG capsule TAKE 1 CAPSULE TWICE DAILY 09/11/21   Hunsucker, Bonna Gains, MD  Probiotic Product (PROBIOTIC DAILY PO) Take 1 capsule by mouth daily.    [provider]  Respiratory Therapy Supplies (FLUTTER) DEVI Use as directed. 04/19/18   Parrett, Fonnie Mu, NP  rifampin (RIFADIN) 300 MG capsule Take 2 capsules (600 mg total) by mouth daily. 01/21/21   Michel Bickers, MD  sodium chloride HYPERTONIC 3 % nebulizer solution INHALE THE CONTENTS OF 1 VIAL ( 4ML ) VIA NEBULIZER TWICE DAILY 04/16/21   Hunsucker, Bonna Gains, MD  Spacer/Aero-Holding Chambers (AEROCHAMBER MV) inhaler Use as instructed 03/24/18   Juanito Doom, MD  TURMERIC PO Take 2,000 mg by mouth daily.     [provider]  zinc gluconate 50 MG tablet Take 50 mg by mouth daily.    [provider]    Allergies    Patient has no known allergies.  Review of Systems   Review of Systems  Constitutional:  Positive for appetite change and chills. Negative for fever.  HENT:  Negative for congestion.   Eyes:  Negative for visual disturbance.  Respiratory:  Negative for shortness of breath.   Cardiovascular:  Negative for chest pain.  Gastrointestinal:  Positive for abdominal distention, abdominal pain, constipation, nausea and vomiting. Negative for blood in stool, diarrhea and rectal pain.  Genitourinary:  Negative for dysuria.  Skin:  Negative for rash.  Neurological:  Negative for headaches.   Physical Exam Updated Vital Signs BP (!) 144/78 (BP Location: Right Arm)   Pulse 87   Temp 97.9 F (36.6 C) (Oral)   Resp 16   Ht 5\' 3"  (1.6 m)   Wt 47.6 kg   SpO2 98%   BMI 18.60 kg/m    Physical Exam Vitals and nursing note reviewed.  Constitutional:      Appearance: Normal appearance. She is not toxic-appearing.  HENT:     Head: Normocephalic.     Mouth/Throat:     Mouth: Mucous membranes are moist.  Cardiovascular:     Rate and Rhythm: Normal rate.  Pulmonary:     Effort: Pulmonary effort is normal. No respiratory distress.  Abdominal:     General: Bowel sounds are decreased.     Palpations: Abdomen is soft.     Tenderness: There is generalized abdominal tenderness. There is no guarding or rebound.  Skin:  General: Skin is warm.  Neurological:     Mental Status: She is alert and oriented to person, place, and time. Mental status is at baseline.  Psychiatric:        Mood and Affect: Mood normal.    ED Results / Procedures / Treatments   Labs (all labs ordered are listed, but only abnormal results are displayed) Labs Reviewed  COMPREHENSIVE METABOLIC PANEL - Abnormal; Notable for the following components:      Result Value   Sodium 130 (*)    Chloride 93 (*)    Glucose, Bld 120 (*)    Total Bilirubin 1.3 (*)    All other components within normal limits  CBC - Abnormal; Notable for the following components:   WBC 12.4 (*)    All other components within normal limits  LIPASE, BLOOD  URINALYSIS, ROUTINE W REFLEX MICROSCOPIC    EKG None  Radiology No results found.  Procedures Procedures   Medications Ordered in ED Medications  0.9 %  sodium chloride infusion (has no administration in time range)  morphine 4 MG/ML injection 4 mg (4 mg Intravenous Given 09/18/21 1401)  sodium chloride 0.9 % bolus 1,000 mL (1,000 mLs Intravenous New Bag/Given 09/18/21 1405)  ondansetron (ZOFRAN) injection 4 mg (4 mg Intravenous Given 09/18/21 1410)    ED Course  I have reviewed the triage vital signs and the nursing notes.  Pertinent labs & imaging results that were available during my care of the patient were reviewed by me and considered in my medical  decision making (see chart for details).    MDM Rules/Calculators/A&P                           75 year old female presents emergency department concern for abdominal pain, bloating, constipation and nausea/vomiting.  Has not had a bowel movement the past 2 days.  Was tachycardic on arrival.  Abdomen is soft for me but generally tender to palpation without any guarding.  History of multiple previous abdominal surgeries without obstruction.  Blood work shows mild hyponatremia and leukocytosis. Plan for CT of A/P and symptomatic treatment. Patient signed out pending CT.  Final Clinical Impression(s) / ED Diagnoses Final diagnoses:  None    Rx / DC Orders ED Discharge Orders     None        Lorelle Gibbs, DO 09/18/21 1452    Lorelle Gibbs, DO 09/18/21 1456

## 2021-09-19 ENCOUNTER — Inpatient Hospital Stay (HOSPITAL_COMMUNITY): Payer: Medicare HMO

## 2021-09-19 DIAGNOSIS — A310 Pulmonary mycobacterial infection: Secondary | ICD-10-CM

## 2021-09-19 DIAGNOSIS — K219 Gastro-esophageal reflux disease without esophagitis: Secondary | ICD-10-CM

## 2021-09-19 DIAGNOSIS — F329 Major depressive disorder, single episode, unspecified: Secondary | ICD-10-CM | POA: Diagnosis not present

## 2021-09-19 DIAGNOSIS — E43 Unspecified severe protein-calorie malnutrition: Secondary | ICD-10-CM | POA: Insufficient documentation

## 2021-09-19 DIAGNOSIS — J479 Bronchiectasis, uncomplicated: Secondary | ICD-10-CM | POA: Diagnosis not present

## 2021-09-19 DIAGNOSIS — K56609 Unspecified intestinal obstruction, unspecified as to partial versus complete obstruction: Secondary | ICD-10-CM | POA: Diagnosis not present

## 2021-09-19 LAB — CBC
HCT: 36.1 % (ref 36.0–46.0)
Hemoglobin: 12.2 g/dL (ref 12.0–15.0)
MCH: 31 pg (ref 26.0–34.0)
MCHC: 33.8 g/dL (ref 30.0–36.0)
MCV: 91.6 fL (ref 80.0–100.0)
Platelets: 297 10*3/uL (ref 150–400)
RBC: 3.94 MIL/uL (ref 3.87–5.11)
RDW: 15.6 % — ABNORMAL HIGH (ref 11.5–15.5)
WBC: 7.8 10*3/uL (ref 4.0–10.5)
nRBC: 0 % (ref 0.0–0.2)

## 2021-09-19 LAB — COMPREHENSIVE METABOLIC PANEL
ALT: 19 U/L (ref 0–44)
AST: 22 U/L (ref 15–41)
Albumin: 2.7 g/dL — ABNORMAL LOW (ref 3.5–5.0)
Alkaline Phosphatase: 77 U/L (ref 38–126)
Anion gap: 4 — ABNORMAL LOW (ref 5–15)
BUN: 8 mg/dL (ref 8–23)
CO2: 25 mmol/L (ref 22–32)
Calcium: 8.9 mg/dL (ref 8.9–10.3)
Chloride: 107 mmol/L (ref 98–111)
Creatinine, Ser: 0.53 mg/dL (ref 0.44–1.00)
GFR, Estimated: >60 mL/min (ref >60)
Glucose, Bld: 118 mg/dL — ABNORMAL HIGH (ref 70–99)
Potassium: 3.9 mmol/L (ref 3.5–5.1)
Sodium: 136 mmol/L (ref 135–145)
Total Bilirubin: 0.5 mg/dL (ref 0.3–1.2)
Total Protein: 6.3 g/dL — ABNORMAL LOW (ref 6.5–8.1)

## 2021-09-19 MED ORDER — BISACODYL 10 MG RE SUPP
10.0000 mg | Freq: Once | RECTAL | Status: DC
  Filled 2021-09-19: qty 1

## 2021-09-19 MED ORDER — RIFAMPIN 300 MG PO CAPS
600.0000 mg | ORAL_CAPSULE | Freq: Every day | ORAL | Status: DC
  Administered 2021-09-19 – 2021-09-28 (×6): 600 mg via ORAL
  Filled 2021-09-19 (×11): qty 2

## 2021-09-19 MED ORDER — ACYCLOVIR 400 MG PO TABS
400.0000 mg | ORAL_TABLET | Freq: Two times a day (BID) | ORAL | Status: DC
  Administered 2021-09-19 – 2021-09-28 (×11): 400 mg via ORAL
  Filled 2021-09-19 (×16): qty 1

## 2021-09-19 MED ORDER — ETHAMBUTOL HCL 400 MG PO TABS
1200.0000 mg | ORAL_TABLET | Freq: Every day | ORAL | Status: DC
  Administered 2021-09-19 – 2021-09-28 (×5): 1200 mg via ORAL
  Filled 2021-09-19 (×10): qty 3

## 2021-09-19 MED ORDER — DRONABINOL 2.5 MG PO CAPS
2.5000 mg | ORAL_CAPSULE | Freq: Two times a day (BID) | ORAL | Status: DC
  Administered 2021-09-19 – 2021-09-24 (×2): 2.5 mg via ORAL
  Filled 2021-09-19 (×8): qty 1

## 2021-09-19 MED ORDER — ALBUTEROL SULFATE HFA 108 (90 BASE) MCG/ACT IN AERS
2.0000 | INHALATION_SPRAY | Freq: Four times a day (QID) | RESPIRATORY_TRACT | Status: DC | PRN
  Filled 2021-09-19: qty 6.7

## 2021-09-19 MED ORDER — VITAMIN D3 25 MCG (1000 UNIT) PO TABS
5000.0000 [IU] | ORAL_TABLET | Freq: Every day | ORAL | Status: DC
  Administered 2021-09-20 – 2021-09-28 (×4): 5000 [IU] via ORAL
  Filled 2021-09-19 (×10): qty 5

## 2021-09-19 MED ORDER — RISAQUAD PO CAPS
ORAL_CAPSULE | Freq: Every day | ORAL | Status: DC
Start: 2021-09-19 — End: 2021-09-29
  Administered 2021-09-19 – 2021-09-28 (×6): 1 via ORAL
  Filled 2021-09-19 (×10): qty 1

## 2021-09-19 MED ORDER — POLYETHYLENE GLYCOL 3350 17 G PO PACK
17.0000 g | PACK | Freq: Once | ORAL | Status: AC
  Administered 2021-09-19: 17 g via ORAL
  Filled 2021-09-19: qty 1

## 2021-09-19 MED ORDER — FLUTICASONE-UMECLIDIN-VILANT 100-62.5-25 MCG/INH IN AEPB: 1.0000 | INHALATION_SPRAY | Freq: Every day | RESPIRATORY_TRACT | Status: DC

## 2021-09-19 MED ORDER — UMECLIDINIUM BROMIDE 62.5 MCG/INH IN AEPB
1.0000 | INHALATION_SPRAY | Freq: Every day | RESPIRATORY_TRACT | Status: DC
  Administered 2021-09-19 – 2021-09-28 (×7): 1 via RESPIRATORY_TRACT
  Filled 2021-09-19 (×2): qty 7

## 2021-09-19 MED ORDER — ATORVASTATIN CALCIUM 20 MG PO TABS
20.0000 mg | ORAL_TABLET | Freq: Every day | ORAL | Status: DC
  Administered 2021-09-19 – 2021-09-28 (×6): 20 mg via ORAL
  Filled 2021-09-19 (×10): qty 1

## 2021-09-19 MED ORDER — BENZONATATE 100 MG PO CAPS: 100.0000 mg | ORAL_CAPSULE | Freq: Three times a day (TID) | ORAL | Status: DC | PRN

## 2021-09-19 MED ORDER — ADULT MULTIVITAMIN W/MINERALS CH
1.0000 | ORAL_TABLET | Freq: Every day | ORAL | Status: DC
  Administered 2021-09-19 – 2021-09-25 (×4): 1 via ORAL
  Filled 2021-09-19 (×9): qty 1

## 2021-09-19 MED ORDER — BOOST PLUS PO LIQD
237.0000 mL | Freq: Three times a day (TID) | ORAL | Status: DC
  Administered 2021-09-19 – 2021-09-26 (×9): 237 mL via ORAL
  Filled 2021-09-19 (×29): qty 237

## 2021-09-19 MED ORDER — LORATADINE 10 MG PO TABS
10.0000 mg | ORAL_TABLET | Freq: Every day | ORAL | Status: DC
Start: 2021-09-19 — End: 2021-09-29
  Administered 2021-09-19 – 2021-09-26 (×4): 10 mg via ORAL
  Filled 2021-09-19 (×9): qty 1

## 2021-09-19 MED ORDER — CALCIUM CARBONATE ANTACID 500 MG PO CHEW
1.0000 | CHEWABLE_TABLET | Freq: Every day | ORAL | Status: DC
  Administered 2021-09-19 – 2021-09-28 (×7): 200 mg via ORAL
  Filled 2021-09-19 (×10): qty 1

## 2021-09-19 MED ORDER — KETOROLAC TROMETHAMINE 15 MG/ML IJ SOLN
15.0000 mg | Freq: Four times a day (QID) | INTRAMUSCULAR | Status: AC | PRN
  Administered 2021-09-19: 15 mg via INTRAVENOUS
  Filled 2021-09-19: qty 1

## 2021-09-19 MED ORDER — FLUTICASONE FUROATE-VILANTEROL 100-25 MCG/INH IN AEPB
1.0000 | INHALATION_SPRAY | Freq: Every day | RESPIRATORY_TRACT | Status: DC
  Administered 2021-09-19 – 2021-09-28 (×7): 1 via RESPIRATORY_TRACT
  Filled 2021-09-19: qty 28

## 2021-09-19 MED ORDER — DIAZEPAM 5 MG PO TABS: 10.0000 mg | ORAL_TABLET | Freq: Four times a day (QID) | ORAL | Status: DC | PRN

## 2021-09-19 MED ORDER — PROSOURCE PLUS PO LIQD
30.0000 mL | Freq: Two times a day (BID) | ORAL | Status: DC
  Administered 2021-09-19 – 2021-09-26 (×6): 30 mL via ORAL
  Filled 2021-09-19 (×7): qty 30

## 2021-09-19 MED ORDER — BOOST / RESOURCE BREEZE PO LIQD CUSTOM
1.0000 | Freq: Three times a day (TID) | ORAL | Status: DC
  Administered 2021-09-19 – 2021-09-28 (×14): 1 via ORAL

## 2021-09-19 MED ORDER — AMIKACIN SULFATE LIPOSOME 590 MG/8.4ML IN SUSP
590.0000 mg | Freq: Every day | RESPIRATORY_TRACT | Status: DC
  Administered 2021-09-28: 590 mg via RESPIRATORY_TRACT

## 2021-09-19 MED ORDER — PANTOPRAZOLE SODIUM 40 MG PO TBEC
40.0000 mg | DELAYED_RELEASE_TABLET | Freq: Every day | ORAL | Status: DC
  Administered 2021-09-19 – 2021-09-28 (×7): 40 mg via ORAL
  Filled 2021-09-19 (×9): qty 1

## 2021-09-19 MED ORDER — KETOROLAC TROMETHAMINE 15 MG/ML IJ SOLN: 15.0000 mg | Freq: Four times a day (QID) | INTRAMUSCULAR | Status: DC | PRN

## 2021-09-19 MED ORDER — MELATONIN 5 MG PO TABS
10.0000 mg | ORAL_TABLET | Freq: Every day | ORAL | Status: DC
  Administered 2021-09-20 – 2021-09-27 (×4): 10 mg via ORAL
  Filled 2021-09-19 (×6): qty 2

## 2021-09-19 MED ORDER — CLOFAZIMINE POWD
100.0000 mg | Freq: Every day | Status: DC
  Administered 2021-09-20 – 2021-09-24 (×2): 100 mg via ORAL

## 2021-09-19 NOTE — Progress Notes (Addendum)
PROGRESS NOTE    Kiara Medina  BDZ:[id number]  DOB: 1946-10-14  PCP: Nicoletta Dress, MD Admit date:09/18/2021 Chief compliant:  75 year old with history of Mycobacterium avium complex pneumonia, bronchiectasis,chronic antibiotic suppressive therapy, status postcholecystectom  presented to the ER with abdominal pain, decreased bowel movements, burping a lot.  Patient's last bowel movement is at least 2 days ago.  She has been having significant nausea vomiting and she is unable to keep anything down ED Course: Afebrile,  Sodium is 130, potassium 3.5 chloride 93, CO2 27 glucose 120.  BUN 12 creatinine 0.64.  LFTs within normal.  White count 12.4. CT abdomen pelvis shows distal small bowel obstruction with transition point in the terminal ileum.  This is highly suspicious for adhesions.  Surgery consulted and patient admitted to the medical service with supportive management, NPO/IVF/analgesics prn.Oral meds held. Attempted NG tube in ED and multiple times on the floor but failed.     Subjective:   Patient seen and examined.  She is concerned about not able to take her p.o. meds for MAC.  She would like to avoid further NG/smallbore tube placement attempts given intolerance/epistaxis.  She denies being able to pass gas, did not have any BM since past Tuesday.  She denies any nausea but reporting headache.  Objective: Vitals:   09/18/21 1830 09/18/21 2055 09/19/21 0053 09/19/21 0453  BP: (!) 150/76 (!) 144/96 (!) 144/86 (!) 142/82  Pulse: 82 85 84 81  Resp: _0 Temp:  97.8 F (36.6 C) 97.6 F (36.4 C) 97.9 F (36.6 C)  TempSrc:  Oral Oral Oral  SpO2: 97% 96% 93% 96%  Weight:      Height:        Intake/Output Summary (Last 24 hours) at 09/19/2021 0800 Last data filed at 09/19/2021 0316 Gross per 24 hour  Intake 1597.64 ml  Output --  Net 1597.64 ml   Filed Weights   09/18/21 1218  Weight: 47.6 kg    Physical Examination:  General: Thin built, no acute distress  noted Head ENT: Atraumatic normocephalic, PERRLA, neck supple Heart: S1-S2 heard, regular rate and rhythm, no murmurs.  No leg edema noted Lungs: Equal air entry bilaterally, no rhonchi or rales on exam, no accessory muscle use Abdomen: Very mildly distended, bowel sounds heard but scant, nontender. No organomegaly.  No CVA tenderness Extremities: No pedal edema.  No cyanosis or clubbing. Neurological: Awake alert oriented x3, no focal weakness or numbness, strength and sensations to crude touch intact Skin: No wounds or rashes.   Data Reviewed: I have personally reviewed following labs and imaging studies  CBC: Recent Labs  Lab 09/18/21 1236 09/19/21 0536  WBC 12.4* 7.8  HGB 14.3 12.2  HCT 42.4 36.1  MCV 91.0 91.6  PLT 376 341   Basic Metabolic Panel: Recent Labs  Lab 09/18/21 1236 09/19/21 0536  NA 130* 136  K 3.5 3.9  CL 93* 107  CO2 27 25  GLUCOSE 120* 118*  BUN 12 8  CREATININE 0.64 0.53  CALCIUM 9.5 8.9   GFR: Estimated Creatinine Clearance: 44.3 mL/min (by C-G formula based on SCr of 0.53 mg/dL). Liver Function Tests: Recent Labs  Lab 09/18/21 1236 09/19/21 0536  AST 25 22  ALT 20 19  ALKPHOS 97 77  BILITOT 1.3* 0.5  PROT 7.3 6.3*  ALBUMIN 3.8 2.7*   Recent Labs  Lab 09/18/21 1236  LIPASE 30   No results for input(s): AMMONIA in the last 168 hours. Coagulation  Profile: No results for input(s): INR, PROTIME in the last 168 hours. Cardiac Enzymes: No results for input(s): CKTOTAL, CKMB, CKMBINDEX, TROPONINI in the last 168 hours. BNP (last 3 results) No results for input(s): PROBNP in the last 8760 hours. HbA1C: No results for input(s): HGBA1C in the last 72 hours. CBG: No results for input(s): GLUCAP in the last 168 hours. Lipid Profile: No results for input(s): CHOL, HDL, LDLCALC, TRIG, CHOLHDL, LDLDIRECT in the last 72 hours. Thyroid Function Tests: No results for input(s): TSH, T4TOTAL, FREET4, T3FREE, THYROIDAB in the last 72  hours. Anemia Panel: No results for input(s): VITAMINB12, FOLATE, FERRITIN, TIBC, IRON, RETICCTPCT in the last 72 hours. Sepsis Labs: No results for input(s): PROCALCITON, LATICACIDVEN in the last 168 hours.  Recent Results (from the past 240 hour(s))  Resp Panel by RT-PCR (Flu A&B, Covid) Nasopharyngeal Swab     Status: None   Collection Time: 09/18/21  4:38 PM   Specimen: Nasopharyngeal Swab; Nasopharyngeal(NP) swabs in vial transport medium  Result Value Ref Range Status   SARS Coronavirus 2 by RT PCR NEGATIVE NEGATIVE Final    Comment: (NOTE) SARS-CoV-2 target nucleic acids are NOT DETECTED.  The SARS-CoV-2 RNA is generally detectable in upper respiratory specimens during the acute phase of infection. The lowest concentration of SARS-CoV-2 viral copies this assay can detect is 138 copies/mL. A negative result does not preclude SARS-Cov-2 infection and should not be used as the sole basis for treatment or other patient management decisions. A negative result may occur with  improper specimen collection/handling, submission of specimen other than nasopharyngeal swab, presence of viral mutation(s) within the areas targeted by this assay, and inadequate number of viral copies(<138 copies/mL). A negative result must be combined with clinical observations, patient history, and epidemiological information. The expected result is Negative.  Fact Sheet for Patients:  EntrepreneurPulse.com.au  Fact Sheet for Healthcare Providers:  IncredibleEmployment.be  This test is no t yet approved or cleared by the [place] FDA and  has been authorized for detection and/or diagnosis of SARS-CoV-2 by FDA under an Emergency Use Authorization (EUA). This EUA will remain  in effect (meaning this test can be used) for the duration of the COVID-19 declaration under Section 564(b)(1) of the Act, 21 U.S.C.section 360bbb-3(b)(1), unless the authorization is  terminated  or revoked sooner.       Influenza A by PCR NEGATIVE NEGATIVE Final   Influenza B by PCR NEGATIVE NEGATIVE Final    Comment: (NOTE) The Xpert Xpress SARS-CoV-2/FLU/RSV plus assay is intended as an aid in the diagnosis of influenza from Nasopharyngeal swab specimens and should not be used as a sole basis for treatment. Nasal washings and aspirates are unacceptable for Xpert Xpress SARS-CoV-2/FLU/RSV testing.  Fact Sheet for Patients: EntrepreneurPulse.com.au  Fact Sheet for Healthcare Providers: IncredibleEmployment.be  This test is not yet approved or cleared by the [place] FDA and has been authorized for detection and/or diagnosis of SARS-CoV-2 by FDA under an Emergency Use Authorization (EUA). This EUA will remain in effect (meaning this test can be used) for the duration of the COVID-19 declaration under Section 564(b)(1) of the Act, 21 U.S.C. section 360bbb-3(b)(1), unless the authorization is terminated or revoked.  Performed at [facility], [address] [place], [place] [address]       Radiology Studies: CT Abdomen Pelvis W Contrast  Result Date: 09/18/2021 CLINICAL DATA:  Acute generalized abdominal pain. Vomiting. Constipation. Decreased bowel sounds. Prior surgical history of appendectomy and cholecystectomy. EXAM: CT  ABDOMEN AND PELVIS WITH CONTRAST TECHNIQUE: Multidetector CT imaging of the abdomen and pelvis was performed using the standard protocol following bolus administration of intravenous contrast. CONTRAST:  111m OMNIPAQUE IOHEXOL 300 MG/ML  [facility]  [facility] Lower Chest: Bronchiectasis, peribronchial thickening, and numerous ill-defined nodular opacities are again seen in both lung bases. A few nodules show central cavitation, and previously seen dominant nodule in the posterior right lower lobe is decreased in size. These findings are consistent  with atypical infectious or inflammatory etiology. Hepatobiliary: No hepatic masses identified. Prior cholecystectomy. No evidence of biliary obstruction. Pancreas:  No mass or inflammatory changes. Spleen: Within normal limits in size and appearance. Adrenals/Urinary Tract: No masses identified. No evidence of ureteral calculi or hydronephrosis. Unremarkable unopacified urinary bladder. Stomach/Bowel: Moderate dilatation of mid and distal small bowel is seen with transition point at the terminal ileum. A stricture is seen involving the terminal ileum, best seen on image 32/5 and 50/6, with mild adjacent mesenteric soft tissue stranding. This is suspicious for adhesion. Mild ascites noted in the pelvis. Vascular/Lymphatic: No pathologically enlarged lymph nodes. No acute vascular findings. Aortic atherosclerotic calcification noted. Reproductive: Prior hysterectomy noted. Adnexal regions are unremarkable in appearance. Other:  None. Musculoskeletal:  No suspicious bone lesions identified. IMPRESSION: Distal small bowel obstruction, with transition point in the terminal ileum, highly suspicious for adhesion. Mild ascites. Chronic bronchiectasis, peribronchial thickening, and waxing and waning nodular opacities in both lung bases, consistent with atypical infectious or inflammatory etiology. Aortic Atherosclerosis (ICD10-I70.0). Electronically Signed   By: JMarlaine HindM.D.   On: 09/18/2021 15:52      Scheduled Meds:  enoxaparin (LOVENOX) injection  40 mg Subcutaneous Q24H   Continuous Infusions:  dextrose 5% lactated ringers 125 mL/hr at 09/19/21 03500    Assessment/Plan:  1.SBO: Patient not nauseous and reports improvement in abdominal distention this morning.  Continue supportive management.  Advised patient that we can hold off on NG tube for now but would avoid p.o. meds until cleared by general surgery.  Repeat KUB ordered for follow-up.  Hopefully will show improvement and can take clear liquid  diet later in the day if okay per surgery.  2. H/o MAI, bronchiectasis: Resume meds when able to take PO. Nebs prn  3. Leukocytosis, hyponatremia: In the setting of problem #1 and nausea/vomiting. Hypovolemic hyponatremia.  Now resolved with IV hydration.  4. Hyperlipidemia: statins  5. Depression/anxiety: resume home meds when able to take po  6.  GERD: PPI  7.  Headache: Will utilize IV Toradol as needed until able to take p.o.  Would like to avoid NSAIDs if possible.  DVT prophylaxis: Lovenox Code Status: Full code Family / Patient Communication: Discussed with patient and answered all questions to her satisfaction Disposition Plan:   Status is: Inpatient  Remains inpatient appropriate because:Inpatient level of care appropriate due to severity of illness, inability to take po, IV fluids  Dispo: The patient is from: Home              Anticipated d/c is to: Home              Patient currently is not medically stable to d/c.   Difficult to place patient No Time spent: 25 mins     >50% time spent in discussions with care team and coordination of care.  Addendum 12:37 pm--repeat abdominal x-ray this morning looks good without evidence of bowel obstruction.  Will start clear liquid diet and resume home meds.  General surgery follow-up pending.  Guilford Shi, MD [facility] Pager in [place]  If 7PM-7AM, please contact night-coverage [email or web address] 09/19/2021, 8:00 AM

## 2021-09-19 NOTE — Progress Notes (Signed)
Failed attempt of NG tube at [facility] noted and reported before patient arrived to [facility]. Contacted materials, ED, and [facility] on call and there aren't any available. Will inform oncoming nurse and physician is aware of the failed attempts per note.

## 2021-09-19 NOTE — Progress Notes (Signed)
Initial Nutrition Assessment  DOCUMENTATION CODES:  Severe malnutrition in context of chronic illness  INTERVENTION:  Continue to advance diet as medically able and as tolerated.  Add Boost Breeze po TID, each supplement provides 250 kcal and 9 grams of protein.  Add 30 ml ProSource Plus po BID, each supplement provides 100 kcal and 15 grams of protein.    Add MVI with minerals daily.  Once advanced to full liquids, add Boost Plus po TID, each supplement provides 360 kcal and 14 grams of protein.  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  GOAL:  Patient will meet greater than or equal to 90% of their needs  MONITOR:  Diet advancement, PO intake, Supplement acceptance, Labs, Weight trends, I & O's  REASON FOR ASSESSMENT:  Malnutrition Screening Tool    ASSESSMENT:  75 yo female with a PMH of Mycobacterium avium complex pneumonia, bronchiectasis, chronic antibiotic suppressive therapy, status postcholecystectom presented to the ER with significant nausea vomiting and she is unable to keep anything down. Admitted with SBO.  Pt feeling much better today. Advanced to clear liquids this afternoon.  Spoke with pt this morning. Pt reports an appetite and wanting to eat again. She reports drinking Boost at home (specifically the [employer] as it is not too sweet). She reports that she picks on foods and tries to eat, but has had a harder time with that lately.  Pt endorses a 21 lb weight loss over the past year. Per Epic, pt has lost ~16.5 lbs (13.5%) in the past year, however this is not significant for the time frame, but still concerning.  Recommend adding Boost Breeze TID, ProSource Plus BID, and MVI with minerals daily.  Medications: reviewed; Vitamin D, Marinol BID, Protonix, D5 in LR @ 125 ml/hr, Zofran PRN (given once today)  Labs: reviewed; Glucose 118 (H)  NUTRITION -  FOCUSED PHYSICAL EXAM: Flowsheet Row Most Recent Value  Orbital Region Moderate depletion  Upper Arm Region Severe depletion  Thoracic and Lumbar Region Mild depletion  Buccal Region Moderate depletion  Temple Region Moderate depletion  Clavicle Bone Region Severe depletion  Clavicle and Acromion Bone Region Severe depletion  Scapular Bone Region Severe depletion  Dorsal Hand Moderate depletion  Patellar Region Severe depletion  Anterior Thigh Region Moderate depletion  Posterior Calf Region Severe depletion  Edema (RD Assessment) None  Hair Reviewed  Eyes Reviewed  Mouth Reviewed  Skin Reviewed  Nails Reviewed   Diet Order:   Diet Order             Diet clear liquid Room service appropriate? Yes; Fluid consistency: Thin  Diet effective now                  EDUCATION NEEDS:  Education needs have been addressed  Skin:  Skin Assessment: Reviewed RN Assessment  Last BM:  PTA/unknown  Height:  Ht Readings from Last 1 Encounters:  09/18/21 _0  (1.6 m)   Weight:  Wt Readings from Last 1 Encounters:  09/18/21 47.6 kg   BMI:  Body mass index is 18.6 kg/m.  Estimated Nutritional Needs:  Kcal:  1800-2000 Protein:  65-80 grams Fluid:  >1.8 L  Derrel Nip, RD, LDN (she/her/hers) Registered Dietitian I After-Hours/Weekend Pager # in [place]

## 2021-09-20 ENCOUNTER — Inpatient Hospital Stay (HOSPITAL_COMMUNITY): Payer: Medicare HMO

## 2021-09-20 DIAGNOSIS — K56609 Unspecified intestinal obstruction, unspecified as to partial versus complete obstruction: Secondary | ICD-10-CM | POA: Diagnosis not present

## 2021-09-20 MED ORDER — LIDOCAINE VISCOUS HCL 2 % MT SOLN
15.0000 mL | Freq: Once | OROMUCOSAL | Status: AC
  Administered 2021-09-20: 15 mL via OROMUCOSAL
  Filled 2021-09-20 (×2): qty 15

## 2021-09-20 MED ORDER — HYDROXYZINE HCL 25 MG PO TABS
25.0000 mg | ORAL_TABLET | Freq: Three times a day (TID) | ORAL | Status: DC | PRN
  Administered 2021-09-20 – 2021-09-25 (×3): 25 mg via ORAL
  Filled 2021-09-20 (×5): qty 1

## 2021-09-20 MED ORDER — PHENOL 1.4 % MT LIQD
1.0000 | OROMUCOSAL | Status: DC | PRN
  Administered 2021-09-21: 1 via OROMUCOSAL
  Filled 2021-09-20 (×2): qty 177

## 2021-09-20 MED ORDER — DOCUSATE SODIUM 100 MG PO CAPS
100.0000 mg | ORAL_CAPSULE | Freq: Two times a day (BID) | ORAL | Status: DC
  Administered 2021-09-20 – 2021-09-28 (×7): 100 mg via ORAL
  Filled 2021-09-20 (×14): qty 1

## 2021-09-20 NOTE — Progress Notes (Signed)
PROGRESS NOTE  Kiara Medina PNT:[id number] DOB: Oct 04, 1946 DOA: 09/18/2021 PCP: Nicoletta Dress, MD  HPI/Recap of past 27 hours: 75 year old female, with past medical history significant for Mycobacterium AVM complex pneumonia, chronic antibiotic suppressive therapy, status postcholecystectomy, bronchiectasis, who presented to the emergency department for evaluation of abdominal pain decreased bowel movement and a lot of gas with burping.  Her last bowel movement was 2 days prior to presentation she has been having significant nausea and vomiting and was unable to keep anything down.  The vomitus was said to be clear and nonbilious.  She has had multiple abdominal surgeries but has never had any small bowel obstruction.  She was transferred from bed points for further evaluation  Subjective: September 20, 2021 Patient seen and examined at bedside.  Patient was said to have been anxious and she refused NG tube placement because it was too big.  She does not have any nausea or vomiting currently.  She stated she has not passed gas or had has not had any bowel movement.  Also complained that the abdominal pain and distention is causing her shortness of breath Patient is on chronic suppressive therapy with medication for her MAC but says she has not been able to do that because of the sterilization process that is required and her cough has increased.  She is currently on ice chips only.  She was not able to tolerate her feeding.  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 Will attempt to output NG tube with a smaller size and lidocaine  2.  History of MAC and bronchiectasis Will resume medication once she is able to take orally  3.  Leukocytosis, hyponatremia likely from #1 and nausea she has not had any vomiting lately  4.  Hyperlipidemia Continue statin once taking orally  5.  Anxiety patient was said to have been on Xanax but there is nothing on her record Am starting her on hydroxyzine  6.  GERD continue PPI  Code Status: Full  Severity of Illness: The appropriate patient status for this patient is INPATIENT. Inpatient status is judged to be reasonable and necessary in order to provide the required intensity of service to ensure the patient's safety. The patient's presenting symptoms, physical exam findings, and initial radiographic and laboratory data in the context of their chronic comorbidities is felt to place them at high risk for further clinical deterioration. Furthermore, it is not anticipated that the patient will be medically stable for discharge from the hospital within 2 midnights of admission. The following factors support the patient status of inpatient.   " Small bowel obstruction * I certify that at the point of admission it is my clinical judgment that the patient will require inpatient hospital care spanning beyond 2 midnights from the point of admission due to high intensity of service, high risk for further deterioration and high frequency of surveillance required.*   Family Communication: Daughter at bedside  Disposition Plan:   Status is: Inpatient   Dispo: The patient is from: Home  Anticipated d/c is to:               Anticipated d/c date is:               Patient currently not medically stable for discharge  Consultants: None  Procedures: None  Antimicrobials: None  DVT prophylaxis: Lovenox   Objective: Vitals:   09/19/21 2235 09/20/21 0647 09/20/21 0752 09/20/21  0756  BP: 136/74 117/73    Pulse: 82 81    Resp: 17 17    Temp: 98.3 F (36.8 C) 98.1 F (36.7 C)    TempSrc: Oral Oral    SpO2: 93% 94% 94% 94%  Weight:      Height:        Intake/Output Summary (Last 24 hours) at 09/20/2021 0916 Last data filed at 09/19/2021 1300 Gross per 24 hour  Intake 440 ml  Output --  Net 440 ml   Filed Weights   09/18/21 1218  Weight: 47.6 kg   Body mass index is 18.6 kg/m.  Exam:  General: 75 y.o. year-old female well developed well nourished in no acute distress.  Alert and oriented x3. Cardiovascular: Regular rate and rhythm with no rubs or gallops.  No thyromegaly or JVD noted.   Respiratory: Clear to auscultation with no wheezes or rales. Good inspiratory effort. Abdomen: Soft nontender nondistended with normal bowel sounds x4 quadrants. Musculoskeletal: No lower extremity edema. 2/4 pulses in all 4 extremities. Skin: No ulcerative lesions noted or rashes, Psychiatry: Mood is appropriate for condition and setting Neurology:    Data Reviewed: CBC: Recent Labs  Lab 09/18/21 1236 09/19/21 0536  WBC 12.4* 7.8  HGB 14.3 12.2  HCT 42.4 36.1  MCV 91.0 91.6  PLT 376 094   Basic Metabolic Panel: Recent Labs  Lab 09/18/21 1236 09/19/21 0536  NA 130* 136  K 3.5 3.9  CL 93* 107  CO2 27 25  GLUCOSE 120* 118*  BUN 12 8  CREATININE 0.64 0.53  CALCIUM 9.5 8.9   GFR: Estimated Creatinine Clearance: 44.3 mL/min (by C-G formula based on SCr of 0.53 mg/dL). Liver Function Tests: Recent Labs  Lab 09/18/21 1236 09/19/21 0536  AST 25 22  ALT 20 19  ALKPHOS 97 77  BILITOT 1.3* 0.5  PROT 7.3 6.3*  ALBUMIN 3.8 2.7*   Recent Labs  Lab 09/18/21 1236  LIPASE 30   No results for input(s): AMMONIA in the last 168 hours. Coagulation Profile: No results for input(s): INR, PROTIME in the last 168 hours. Cardiac Enzymes: No results for input(s): CKTOTAL, CKMB, CKMBINDEX, TROPONINI in the last 168 hours. BNP (last 3 results) No  results for input(s): PROBNP in the last 8760 hours. HbA1C: No results for input(s): HGBA1C in the last 72 hours. CBG: No results for input(s): GLUCAP in the last 168 hours. Lipid Profile: No results for input(s): CHOL, HDL, LDLCALC, TRIG, CHOLHDL, LDLDIRECT in the last 72 hours. Thyroid Function Tests: No results for input(s): TSH, T4TOTAL, FREET4, T3FREE, THYROIDAB in the last 72 hours. Anemia Panel: No results for input(s): VITAMINB12, FOLATE, FERRITIN, TIBC, IRON, RETICCTPCT in the last 72 hours. Urine analysis:    Component Value Date/Time   COLORURINE ORANGE (A) 09/18/2021 1237   APPEARANCEUR CLEAR 09/18/2021 1237   LABSPEC 1.005 09/18/2021 1237   PHURINE 6.0 09/18/2021 [place] 09/18/2021 1237   HGBUR NEGATIVE 09/18/2021 1237   [place] 09/18/2021 [place] 09/18/2021 [place] 09/18/2021 1237  NITRITE NEGATIVE 09/18/2021 [place] 09/18/2021 1237   Sepsis Labs: _0 (procalcitonin:4,lacticidven:4)  ) Recent Results (from the past 240 hour(s))  Resp Panel by RT-PCR (Flu A&B, Covid) Nasopharyngeal Swab     Status: None   Collection Time: 09/18/21  4:38 PM   Specimen: Nasopharyngeal Swab; Nasopharyngeal(NP) swabs in vial transport medium  Result Value Ref Range Status   SARS Coronavirus 2 by RT PCR NEGATIVE NEGATIVE Final    Comment: (NOTE) SARS-CoV-2 target nucleic acids are NOT DETECTED.  The SARS-CoV-2 RNA is generally detectable in upper respiratory specimens during the acute phase of infection. The lowest concentration of SARS-CoV-2 viral copies this assay can detect is 138 copies/mL. A negative result does not preclude SARS-Cov-2 infection and should not be used as the sole basis for treatment or other patient management decisions. A negative result may occur with  improper specimen collection/handling, submission of specimen other than nasopharyngeal swab, presence of viral  mutation(s) within the areas targeted by this assay, and inadequate number of viral copies(<138 copies/mL). A negative result must be combined with clinical observations, patient history, and epidemiological information. The expected result is Negative.  Fact Sheet for Patients:  EntrepreneurPulse.com.au  Fact Sheet for Healthcare Providers:  IncredibleEmployment.be  This test is no t yet approved or cleared by the [place] FDA and  has been authorized for detection and/or diagnosis of SARS-CoV-2 by FDA under an Emergency Use Authorization (EUA). This EUA will remain  in effect (meaning this test can be used) for the duration of the COVID-19 declaration under Section 564(b)(1) of the Act, 21 U.S.C.section 360bbb-3(b)(1), unless the authorization is terminated  or revoked sooner.       Influenza A by PCR NEGATIVE NEGATIVE Final   Influenza B by PCR NEGATIVE NEGATIVE Final    Comment: (NOTE) The Xpert Xpress SARS-CoV-2/FLU/RSV plus assay is intended as an aid in the diagnosis of influenza from Nasopharyngeal swab specimens and should not be used as a sole basis for treatment. Nasal washings and aspirates are unacceptable for Xpert Xpress SARS-CoV-2/FLU/RSV testing.  Fact Sheet for Patients: EntrepreneurPulse.com.au  Fact Sheet for Healthcare Providers: IncredibleEmployment.be  This test is not yet approved or cleared by the [place] FDA and has been authorized for detection and/or diagnosis of SARS-CoV-2 by FDA under an Emergency Use Authorization (EUA). This EUA will remain in effect (meaning this test can be used) for the duration of the COVID-19 declaration under Section 564(b)(1) of the Act, 21 U.S.C. section 360bbb-3(b)(1), unless the authorization is terminated or revoked.  Performed at [facility], [address] [place], [place] [address]       Studies: DG Abd  Portable 1V  Result Date: 09/19/2021 CLINICAL DATA:  Abdominal pain. EXAM: PORTABLE ABDOMEN - 1 VIEW COMPARISON:  None. FINDINGS: The bowel gas pattern is normal. Mild amount of stool seen throughout the colon. No radio-opaque calculi or other significant radiographic abnormality are seen. IMPRESSION: No evidence of bowel obstruction or ileus. Electronically Signed   By: Marijo Conception M.D.   On: 09/19/2021 12:15    Scheduled Meds:  (feeding supplement) PROSource Plus  30 mL Oral BID BM   acidophilus   Oral Daily   acyclovir  400 mg Oral BID   Amikacin Sulfate Liposome  590 mg Inhalation Daily   atorvastatin  20 mg Oral Daily   bisacodyl  10 mg Rectal Once   calcium carbonate  1 tablet Oral Daily   cholecalciferol  5,000 Units  Oral Daily   Clofazimine  100 mg Oral Q supper   dronabinol  2.5 mg Oral BID AC   enoxaparin (LOVENOX) injection  40 mg Subcutaneous Q24H   ethambutol  1,200 mg Oral Daily   feeding supplement  1 Container Oral TID BM   umeclidinium bromide  1 puff Inhalation Daily   And   fluticasone furoate-vilanterol  1 puff Inhalation Daily   lactose free nutrition  237 mL Oral TID WC   loratadine  10 mg Oral Daily   melatonin  10 mg Oral QHS   multivitamin with minerals  1 tablet Oral Daily   pantoprazole  40 mg Oral Daily   rifampin  600 mg Oral Daily    Continuous Infusions:  dextrose 5% lactated ringers 125 mL/hr at 09/19/21 0824     LOS: 2 days     Cristal Deer, MD [facility]  To reach me or the doctor on call, go to: [email or web address] Password [email or web address]  09/20/2021, 9:16 AM

## 2021-09-20 NOTE — Plan of Care (Signed)
  Problem: Education: Goal: Knowledge of General Education information will improve Description: Including pain rating scale, medication(s)/side effects and non-pharmacologic comfort measures Outcome: Progressing   Problem: Health Behavior/Discharge Planning: Goal: Ability to manage health-related needs will improve Outcome: Progressing   Problem: Clinical Measurements: Goal: Ability to maintain clinical measurements within normal limits will improve Outcome: Progressing Goal: Will remain free from infection Outcome: Progressing Goal: Diagnostic test results will improve Outcome: Progressing Goal: Respiratory complications will improve Outcome: Progressing Goal: Cardiovascular complication will be avoided Outcome: Progressing   Problem: Activity: Goal: Risk for activity intolerance will decrease Outcome: Progressing   Problem: Nutrition: Goal: Adequate nutrition will be maintained Outcome: Progressing   Problem: Coping: Goal: Level of anxiety will decrease Outcome: Progressing   Problem: Elimination: Goal: Will not experience complications related to bowel motility Outcome: Progressing Goal: Will not experience complications related to urinary retention Outcome: Progressing   Problem: Pain Managment: Goal: General experience of comfort will improve Outcome: Progressing   Problem: Safety: Goal: Ability to remain free from injury will improve Outcome: Progressing   Problem: Skin Integrity: Goal: Risk for impaired skin integrity will decrease Outcome: Progressing   Problem: Education: Goal: Ability to state activities that reduce stress will improve Outcome: Progressing   Problem: Coping: Goal: Ability to identify and develop effective coping behavior will improve Outcome: Progressing   Problem: Self-Concept: Goal: Ability to identify factors that promote anxiety will improve Outcome: Progressing Goal: Level of anxiety will decrease Outcome:  Progressing Goal: Ability to modify response to factors that promote anxiety will improve Outcome: Progressing   Problem: Nutrition Goal: Patient maintains adequate hydration Outcome: Progressing

## 2021-09-21 DIAGNOSIS — K56609 Unspecified intestinal obstruction, unspecified as to partial versus complete obstruction: Secondary | ICD-10-CM | POA: Diagnosis not present

## 2021-09-21 LAB — BASIC METABOLIC PANEL
Anion gap: 8 (ref 5–15)
BUN: 7 mg/dL — ABNORMAL LOW (ref 8–23)
CO2: 26 mmol/L (ref 22–32)
Calcium: 8.8 mg/dL — ABNORMAL LOW (ref 8.9–10.3)
Chloride: 99 mmol/L (ref 98–111)
Creatinine, Ser: 0.68 mg/dL (ref 0.44–1.00)
GFR, Estimated: >60 mL/min (ref >60)
Glucose, Bld: 128 mg/dL — ABNORMAL HIGH (ref 70–99)
Potassium: 3.6 mmol/L (ref 3.5–5.1)
Sodium: 133 mmol/L — ABNORMAL LOW (ref 135–145)

## 2021-09-21 LAB — CBC
HCT: 36.9 % (ref 36.0–46.0)
Hemoglobin: 12.5 g/dL (ref 12.0–15.0)
MCH: 30.5 pg (ref 26.0–34.0)
MCHC: 33.9 g/dL (ref 30.0–36.0)
MCV: 90 fL (ref 80.0–100.0)
Platelets: 241 10*3/uL (ref 150–400)
RBC: 4.1 MIL/uL (ref 3.87–5.11)
RDW: 15.2 % (ref 11.5–15.5)
WBC: 9.8 10*3/uL (ref 4.0–10.5)
nRBC: 0 % (ref 0.0–0.2)

## 2021-09-21 NOTE — Progress Notes (Signed)
PROGRESS NOTE  Kiara Medina GGY:[id number] DOB: 28-May-1946 DOA: 09/18/2021 PCP: Nicoletta Dress, MD  HPI/Recap of past 27 hours: 75 year old female, with past medical history significant for Mycobacterium AVM complex pneumonia, chronic antibiotic suppressive therapy, status postcholecystectomy, bronchiectasis, who presented to the emergency department for evaluation of abdominal pain decreased bowel movement and a lot of gas with burping.  Her last bowel movement was 2 days prior to presentation she has been having significant nausea and vomiting and was unable to keep anything down.  The vomitus was said to be clear and nonbilious.  She has had multiple abdominal surgeries but has never had any small bowel obstruction.  She was transferred from bed points for further evaluation  Subjective: September 20, 2021 Patient seen and examined at bedside.  Patient was said to have been anxious and she refused NG tube placement because it was too big.  She does not have any nausea or vomiting currently.  She stated she has not passed gas or had has not had any bowel movement.  Also complained that the abdominal pain and distention is causing her shortness of breath Patient is on chronic suppressive therapy with medication for her MAC but says she has not been able to do that because of the sterilization process that is required and her cough has increased.  She is currently on ice chips only.  She was not able to tolerate her feeding.  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 Will attempt to output NG tube with a smaller size and lidocaine  2.  History of MAC and bronchiectasis Will resume medication once she is able to take orally  3.  Leukocytosis, hyponatremia likely from #1 and nausea she has not had any vomiting lately  4.  Hyperlipidemia Continue statin once taking orally  5.  Anxiety patient was said to have been on Xanax but there is nothing on her record Am starting her on hydroxyzine  6.  GERD continue PPI  Code Status: Full  Severity of Illness: The appropriate patient status for this patient is INPATIENT. Inpatient status is judged to be reasonable and necessary in order to provide the required intensity of service to ensure the patient's safety. The patient's presenting symptoms, physical exam findings, and initial radiographic and laboratory data in the context of their chronic comorbidities is felt to place them at high risk for further clinical deterioration. Furthermore, it is not anticipated that the patient will be medically stable for discharge from the hospital within 2 midnights of admission. The following factors support the patient status of inpatient.   " Small bowel obstruction * I certify that at the point of admission it is my clinical judgment that the patient will require inpatient hospital care spanning beyond 2 midnights from the point of admission due to high intensity of service, high  risk for further deterioration and high frequency of surveillance required.*   Family Communication: Daughter Heather at bedside  Disposition Plan:   Status is: Inpatient   Dispo: The patient is from: Home              Anticipated d/c is to:               Anticipated d/c date is:               Patient currently  not medically stable for discharge  Consultants: None  Procedures: None  Antimicrobials: None  DVT prophylaxis: Lovenox   Objective: Vitals:   09/20/21 0756 09/20/21 1332 09/20/21 2017 09/21/21 0449  BP:  121/71 (!) 146/72 (!) 141/72  Pulse:  80 83 91  Resp:  _0 Temp:  98.1 F (36.7 C) 98.3 F (36.8 C) 98.8 F (37.1 C)  TempSrc:  Oral Oral Oral  SpO2: 94% 94%  92%  Weight:      Height:        Intake/Output Summary (Last 24 hours) at 09/21/2021 1122 Last data filed at 09/21/2021 0217 Gross per 24 hour  Intake --  Output 1050 ml  Net -1050 ml    Filed Weights   09/18/21 1218  Weight: 47.6 kg   Body mass index is 18.6 kg/m.  Exam:  General: 75 y.o. year-old female well developed well nourished in no acute distress.  Alert and oriented x3. Cardiovascular: Regular rate and rhythm with no rubs or gallops.  No thyromegaly or JVD noted.   Respiratory: Clear to auscultation with no wheezes or rales. Good inspiratory effort. Abdomen: Soft nontender nondistended with normal bowel sounds x4 quadrants. Musculoskeletal: No lower extremity edema. 2/4 pulses in all 4 extremities. Skin: No ulcerative lesions noted or rashes, Psychiatry: Mood is appropriate for condition and setting Neurology:    Data Reviewed: CBC: Recent Labs  Lab 09/18/21 1236 09/19/21 0536 09/21/21 0554  WBC 12.4* 7.8 9.8  HGB 14.3 12.2 12.5  HCT 42.4 36.1 36.9  MCV 91.0 91.6 90.0  PLT 376 297 371    Basic Metabolic Panel: Recent Labs  Lab 09/18/21 1236 09/19/21 0536 09/21/21 0554  NA 130* 136 133*  K 3.5 3.9 3.6  CL 93* 107 99  CO2 _1 GLUCOSE 120* 118* 128*  BUN 12 8 7*  CREATININE 0.64 0.53 0.68  CALCIUM 9.5 8.9 8.8*    GFR: Estimated Creatinine Clearance: 44.3 mL/min (by C-G formula based on SCr of 0.68 mg/dL). Liver Function Tests: Recent Labs  Lab 09/18/21 1236 09/19/21 0536  AST 25 22  ALT 20 19  ALKPHOS 97 77  BILITOT 1.3* 0.5  PROT 7.3 6.3*   ALBUMIN 3.8 2.7*    Recent Labs  Lab 09/18/21 1236  LIPASE 30    No results for input(s): AMMONIA in the last 168 hours. Coagulation Profile: No results for input(s): INR, PROTIME in the last 168 hours. Cardiac Enzymes: No results for input(s): CKTOTAL, CKMB, CKMBINDEX, TROPONINI in the last 168 hours. BNP (last 3 results) No results for input(s): PROBNP in the last 8760 hours. HbA1C: No results for input(s): HGBA1C in the last 72 hours. CBG: No results for input(s): GLUCAP in the last 168 hours. Lipid Profile: No results for input(s): CHOL, HDL, LDLCALC, TRIG, CHOLHDL, LDLDIRECT in the last 72 hours. Thyroid Function Tests: No results for input(s): TSH, T4TOTAL, FREET4, T3FREE, THYROIDAB in the last 72 hours. Anemia Panel: No results for input(s): VITAMINB12, FOLATE,  FERRITIN, TIBC, IRON, RETICCTPCT in the last 72 hours. Urine analysis:    Component Value Date/Time   COLORURINE ORANGE (A) 09/18/2021 1237   APPEARANCEUR CLEAR 09/18/2021 1237   LABSPEC 1.005 09/18/2021 1237   PHURINE 6.0 09/18/2021 1237   GLUCOSEU NEGATIVE 09/18/2021 1237   HGBUR NEGATIVE 09/18/2021 1237   BILIRUBINUR NEGATIVE 09/18/2021 1237   KETONESUR NEGATIVE 09/18/2021 1237   PROTEINUR NEGATIVE 09/18/2021 1237   NITRITE NEGATIVE 09/18/2021 1237   LEUKOCYTESUR NEGATIVE 09/18/2021 1237   Sepsis Labs: _0 (procalcitonin:4,lacticidven:4)  ) Recent Results (from the past 240 hour(s))  Resp Panel by RT-PCR (Flu A&B, Covid) Nasopharyngeal Swab     Status: None   Collection Time: 09/18/21  4:38 PM   Specimen: Nasopharyngeal Swab; Nasopharyngeal(NP) swabs in vial transport medium  Result Value Ref Range Status   SARS Coronavirus 2 by RT PCR NEGATIVE NEGATIVE Final    Comment: (NOTE) SARS-CoV-2 target nucleic acids are NOT DETECTED.  The SARS-CoV-2 RNA is generally detectable in upper respiratory specimens during the acute phase of infection. The lowest concentration of SARS-CoV-2 viral  copies this assay can detect is 138 copies/mL. A negative result does not preclude SARS-Cov-2 infection and should not be used as the sole basis for treatment or other patient management decisions. A negative result may occur with  improper specimen collection/handling, submission of specimen other than nasopharyngeal swab, presence of viral mutation(s) within the areas targeted by this assay, and inadequate number of viral copies(<138 copies/mL). A negative result must be combined with clinical observations, patient history, and epidemiological information. The expected result is Negative.  Fact Sheet for Patients:  EntrepreneurPulse.com.au  Fact Sheet for Healthcare Providers:  IncredibleEmployment.be  This test is no t yet approved or cleared by the [place] FDA and  has been authorized for detection and/or diagnosis of SARS-CoV-2 by FDA under an Emergency Use Authorization (EUA). This EUA will remain  in effect (meaning this test can be used) for the duration of the COVID-19 declaration under Section 564(b)(1) of the Act, 21 U.S.C.section 360bbb-3(b)(1), unless the authorization is terminated  or revoked sooner.       Influenza A by PCR NEGATIVE NEGATIVE Final   Influenza B by PCR NEGATIVE NEGATIVE Final    Comment: (NOTE) The Xpert Xpress SARS-CoV-2/FLU/RSV plus assay is intended as an aid in the diagnosis of influenza from Nasopharyngeal swab specimens and should not be used as a sole basis for treatment. Nasal washings and aspirates are unacceptable for Xpert Xpress SARS-CoV-2/FLU/RSV testing.  Fact Sheet for Patients: EntrepreneurPulse.com.au  Fact Sheet for Healthcare Providers: IncredibleEmployment.be  This test is not yet approved or cleared by the [place] FDA and has been authorized for detection and/or diagnosis of SARS-CoV-2 by FDA under an Emergency Use Authorization (EUA). This  EUA will remain in effect (meaning this test can be used) for the duration of the COVID-19 declaration under Section 564(b)(1) of the Act, 21 U.S.C. section 360bbb-3(b)(1), unless the authorization is terminated or revoked.  Performed at [facility], [address] [place], [place] [address]       Studies: DG Abd 1 View  Result Date: 09/20/2021 CLINICAL DATA:  NG tube placement. EXAM: ABDOMEN - 1 VIEW COMPARISON:  September 20, 2021 FINDINGS: Enteric catheter with tip overlying the expected location of gastric antrum. Persistent mild small bowel dilation. IMPRESSION: Negative. Electronically Signed   By: Fidela Salisbury M.D.   On: 09/20/2021 19:58   DG Abd 1 View  Result Date: 09/20/2021 CLINICAL  DATA:  Small-bowel obstruction, abdominal pain, nausea EXAM: ABDOMEN - 1 VIEW COMPARISON:  09/19/2021 FINDINGS: Supine frontal view of the abdomen and pelvis demonstrates distended gas-filled loops of small bowel within the central abdomen, measuring to 4.0 cm in diameter. Minimal gas and stool throughout the colon. No masses or abnormal calcifications. Chronic bibasilar airspace disease identified at the lung bases. IMPRESSION: 1. Findings consistent with small-bowel obstruction, with increasing gaseous distention of the small bowel. 2. Persistent bibasilar airspace disease compatible with known chronic atypical infection. Electronically Signed   By: Randa Ngo M.D.   On: 09/20/2021 15:26    Scheduled Meds:  (feeding supplement) PROSource Plus  30 mL Oral BID BM   acidophilus   Oral Daily   acyclovir  400 mg Oral BID   Amikacin Sulfate Liposome  590 mg Inhalation Daily   atorvastatin  20 mg Oral Daily   bisacodyl  10 mg Rectal Once   calcium carbonate  1 tablet Oral Daily   cholecalciferol  5,000 Units Oral Daily   Clofazimine  100 mg Oral Q supper   docusate sodium  100 mg Oral BID   dronabinol  2.5 mg Oral BID AC   enoxaparin (LOVENOX) injection  40 mg Subcutaneous Q24H    ethambutol  1,200 mg Oral Daily   feeding supplement  1 Container Oral TID BM   umeclidinium bromide  1 puff Inhalation Daily   And   fluticasone furoate-vilanterol  1 puff Inhalation Daily   lactose free nutrition  237 mL Oral TID WC   loratadine  10 mg Oral Daily   melatonin  10 mg Oral QHS   multivitamin with minerals  1 tablet Oral Daily   pantoprazole  40 mg Oral Daily   rifampin  600 mg Oral Daily    Continuous Infusions:  dextrose 5% lactated ringers 1,000 mL (09/21/21 1112)     LOS: 3 days     Cristal Deer, MD [facility]  To reach me or the doctor on call, go to: [email or web address] Password [email or web address]  09/21/2021, 11:22 AM

## 2021-09-22 DIAGNOSIS — K56609 Unspecified intestinal obstruction, unspecified as to partial versus complete obstruction: Secondary | ICD-10-CM | POA: Diagnosis not present

## 2021-09-22 LAB — BASIC METABOLIC PANEL
Anion gap: 5 (ref 5–15)
BUN: <5 mg/dL — ABNORMAL LOW (ref 8–23)
CO2: 28 mmol/L (ref 22–32)
Calcium: 8.4 mg/dL — ABNORMAL LOW (ref 8.9–10.3)
Chloride: 100 mmol/L (ref 98–111)
Creatinine, Ser: 0.55 mg/dL (ref 0.44–1.00)
GFR, Estimated: >60 mL/min (ref >60)
Glucose, Bld: 113 mg/dL — ABNORMAL HIGH (ref 70–99)
Potassium: 3.3 mmol/L — ABNORMAL LOW (ref 3.5–5.1)
Sodium: 133 mmol/L — ABNORMAL LOW (ref 135–145)

## 2021-09-22 MED ORDER — POTASSIUM CHLORIDE 10 MEQ/100ML IV SOLN
10.0000 meq | INTRAVENOUS | Status: AC
  Administered 2021-09-22 (×2): 10 meq via INTRAVENOUS
  Filled 2021-09-22: qty 100

## 2021-09-22 NOTE — Care Management Important Message (Signed)
Important Message  Patient Details IM Letter given to the Patient. Name: Kiara Medina MRN: [id number] Date of Birth: [birth year]   Medicare Important Message Given:  Yes     Kerin Salen 09/22/2021, 9:21 AM

## 2021-09-22 NOTE — Progress Notes (Signed)
PROGRESS NOTE  Kiara Medina DGU:[id number] DOB: [birth year] DOA: 09/18/2021 PCP: Nicoletta Dress, MD  HPI/Recap of past 67 hours: 75 year old female, with past medical history significant for Mycobacterium AVM complex pneumonia, chronic antibiotic suppressive therapy, status postcholecystectomy, bronchiectasis, who presented to the emergency department for evaluation of abdominal pain decreased bowel movement and a lot of gas with burping.  Her last bowel movement was 2 days prior to presentation she has been having significant nausea and vomiting and was unable to keep anything down.  The vomitus was said to be clear and nonbilious.  She has had multiple abdominal surgeries but has never had any small bowel obstruction.  She was transferred from bed points for further evaluation  Subjective: September 20, 2021 Patient seen and examined at bedside.  Patient was said to have been anxious and she refused NG tube placement because it was too big.  She does not have any nausea or vomiting currently.  She stated she has not passed gas or had has not had any bowel movement.  Also complained that the abdominal pain and distention is causing her shortness of breath Patient is on chronic suppressive therapy with medication for her MAC but says she has not been able to do that because of the sterilization process that is required and her cough has increased.  She is currently on ice chips only.  She was not able to tolerate her feeding.  She has also been able to get up and she says she walked with her daughter to the hallway  September 21, 2021: Patient seen and examined at bedside With finally able to put in an NG tube last night and is draining copiously Patient stated she feels a little better decreased shortness of breath  September 22, 2021: Patient seen and examined at bedside she still has an NG tube which is draining she stated she is having quite a bit of relief and was wondering when the NG tube can  be removed.  Currently the NG tube has 700 mL of drainage in it  Assessment/Plan: Principal Problem:   SBO (small bowel obstruction) ([facility]) Active Problems:   BRONCHIECTASIS   Mycobacterium avium-intracellulare complex ([facility])   Dyslipidemia   GERD (gastroesophageal reflux disease)   Depression   Protein-calorie malnutrition, severe #1 small bowel obstruction Patient has not passed BM but she is passing a little bit of gas Repeat KUB showed worsening of the distention. Will attempt to output NG tube with a smaller size and lidocaine  2.  History of MAC and bronchiectasis Will resume medication once she is able to take orally  3.  Leukocytosis, hyponatremia likely from #1 and nausea she has not had any vomiting lately  4.  Hyperlipidemia Continue statin once taking orally  5.  Anxiety patient was said to have been on Xanax but there is nothing on her record Am starting her on hydroxyzine  6.  GERD continue PPI  Code Status: Full  Severity of Illness: The appropriate patient status for this patient is INPATIENT. Inpatient status is judged to be reasonable and necessary in order to provide the required intensity of service to ensure the patient's safety. The patient's presenting symptoms, physical exam findings, and initial radiographic and laboratory data in the context of their chronic comorbidities is felt to place them at high risk for further clinical deterioration. Furthermore, it is not anticipated that the patient will be medically stable for discharge from the hospital within 2 midnights of admission. The following  factors support the patient status of inpatient.   " Small bowel obstruction * I certify that at the point of admission it is my clinical judgment that the patient will require inpatient hospital care spanning beyond 2 midnights from the point of admission due to high intensity of service, high risk for further deterioration and high frequency of surveillance  required.*   Family Communication: Daughter Heather at bedside  Disposition Plan:   Status is: Inpatient   Dispo: The patient is from: Home              Anticipated d/c is to:               Anticipated d/c date is:               Patient currently not medically stable for discharge  Consultants: None  Procedures: None  Antimicrobials: None  DVT prophylaxis: Lovenox   Objective: Vitals:   09/21/21 0449 09/21/21 1455 09/21/21 2138 09/22/21 0614  BP: (!) 141/72 (!) 144/78 (!) 148/74 138/70  Pulse: 91 87 87 86  Resp: _0 Temp: 98.8 F (37.1 C) 98.2 F (36.8 C) 98.7 F (37.1 C) 98.8 F (37.1 C)  TempSrc: Oral Oral Oral Oral  SpO2: 92% 96% 91% 90%  Weight:      Height:        Intake/Output Summary (Last 24 hours) at 09/22/2021 1253 Last data filed at 09/22/2021 0552 Gross per 24 hour  Intake 8606.91 ml  Output 750 ml  Net 7856.91 ml    Filed Weights   09/18/21 1218  Weight: 47.6 kg   Body mass index is 18.6 kg/m.  Exam:  General: 75 y.o. year-old female well developed well nourished in no acute distress.  Alert and oriented x3. Cardiovascular: Regular rate and rhythm with no rubs or gallops.  No thyromegaly or JVD noted.   Respiratory: Clear to auscultation with no wheezes or rales. Good inspiratory effort. Abdomen: Soft nontender nondistended with normal bowel sounds x4 quadrants. Musculoskeletal: No lower extremity edema. 2/4 pulses in all 4 extremities. Skin: No ulcerative lesions noted or rashes, Psychiatry: Mood is appropriate for condition and setting Neurology:    Data Reviewed: CBC: Recent Labs  Lab 09/18/21 1236 09/19/21 0536 09/21/21 0554  WBC 12.4* 7.8 9.8  HGB 14.3 12.2 12.5  HCT 42.4 36.1 36.9  MCV 91.0 91.6 90.0  PLT 376 297 709    Basic Metabolic Panel: Recent Labs  Lab 09/18/21 1236 09/19/21 0536 09/21/21 0554 09/22/21 0537  NA 130* 136 133* 133*  K 3.5 3.9 3.6 3.3*  CL 93* 107 99 100  CO2 _1 GLUCOSE 120* 118* 128* 113*  BUN 12 8 7* <5*  CREATININE 0.64 0.53 0.68 0.55  CALCIUM 9.5 8.9 8.8* 8.4*    GFR: Estimated Creatinine Clearance: 44.3 mL/min (by C-G formula based on SCr of 0.55 mg/dL). Liver Function Tests: Recent Labs  Lab 09/18/21 1236 09/19/21 0536  AST 25 22  ALT 20 19  ALKPHOS 97 77  BILITOT 1.3* 0.5  PROT 7.3 6.3*  ALBUMIN 3.8 2.7*    Recent Labs  Lab 09/18/21 1236  LIPASE 30    No results for input(s): AMMONIA in the last 168 hours. Coagulation Profile: No results for input(s): INR, PROTIME in the last 168 hours. Cardiac Enzymes: No results for input(s): CKTOTAL, CKMB, CKMBINDEX, TROPONINI in the last 168 hours. BNP (last 3 results) No results for input(s): PROBNP in the last  8760 hours. HbA1C: No results for input(s): HGBA1C in the last 72 hours. CBG: No results for input(s): GLUCAP in the last 168 hours. Lipid Profile: No results for input(s): CHOL, HDL, LDLCALC, TRIG, CHOLHDL, LDLDIRECT in the last 72 hours. Thyroid Function Tests: No results for input(s): TSH, T4TOTAL, FREET4, T3FREE, THYROIDAB in the last 72 hours. Anemia Panel: No results for input(s): VITAMINB12, FOLATE, FERRITIN, TIBC, IRON, RETICCTPCT in the last 72 hours. Urine analysis:    Component Value Date/Time   COLORURINE ORANGE (A) 09/18/2021 1237   APPEARANCEUR CLEAR 09/18/2021 1237   LABSPEC 1.005 09/18/2021 1237   PHURINE 6.0 09/18/2021 1237   GLUCOSEU NEGATIVE 09/18/2021 1237   HGBUR NEGATIVE 09/18/2021 1237   BILIRUBINUR NEGATIVE 09/18/2021 1237   KETONESUR NEGATIVE 09/18/2021 1237   PROTEINUR NEGATIVE 09/18/2021 1237   NITRITE NEGATIVE 09/18/2021 1237   LEUKOCYTESUR NEGATIVE 09/18/2021 1237   Sepsis Labs: _0 (procalcitonin:4,lacticidven:4)  ) Recent Results (from the past 240 hour(s))  Resp Panel by RT-PCR (Flu A&B, Covid) Nasopharyngeal Swab     Status: None   Collection Time: 09/18/21  4:38 PM   Specimen: Nasopharyngeal Swab; Nasopharyngeal(NP)  swabs in vial transport medium  Result Value Ref Range Status   SARS Coronavirus 2 by RT PCR NEGATIVE NEGATIVE Final    Comment: (NOTE) SARS-CoV-2 target nucleic acids are NOT DETECTED.  The SARS-CoV-2 RNA is generally detectable in upper respiratory specimens during the acute phase of infection. The lowest concentration of SARS-CoV-2 viral copies this assay can detect is 138 copies/mL. A negative result does not preclude SARS-Cov-2 infection and should not be used as the sole basis for treatment or other patient management decisions. A negative result may occur with  improper specimen collection/handling, submission of specimen other than nasopharyngeal swab, presence of viral mutation(s) within the areas targeted by this assay, and inadequate number of viral copies(<138 copies/mL). A negative result must be combined with clinical observations, patient history, and epidemiological information. The expected result is Negative.  Fact Sheet for Patients:  EntrepreneurPulse.com.au  Fact Sheet for Healthcare Providers:  IncredibleEmployment.be  This test is no t yet approved or cleared by the [place] FDA and  has been authorized for detection and/or diagnosis of SARS-CoV-2 by FDA under an Emergency Use Authorization (EUA). This EUA will remain  in effect (meaning this test can be used) for the duration of the COVID-19 declaration under Section 564(b)(1) of the Act, 21 U.S.C.section 360bbb-3(b)(1), unless the authorization is terminated  or revoked sooner.       Influenza A by PCR NEGATIVE NEGATIVE Final   Influenza B by PCR NEGATIVE NEGATIVE Final    Comment: (NOTE) The Xpert Xpress SARS-CoV-2/FLU/RSV plus assay is intended as an aid in the diagnosis of influenza from Nasopharyngeal swab specimens and should not be used as a sole basis for treatment. Nasal washings and aspirates are unacceptable for Xpert Xpress  SARS-CoV-2/FLU/RSV testing.  Fact Sheet for Patients: EntrepreneurPulse.com.au  Fact Sheet for Healthcare Providers: IncredibleEmployment.be  This test is not yet approved or cleared by the [place] FDA and has been authorized for detection and/or diagnosis of SARS-CoV-2 by FDA under an Emergency Use Authorization (EUA). This EUA will remain in effect (meaning this test can be used) for the duration of the COVID-19 declaration under Section 564(b)(1) of the Act, 21 U.S.C. section 360bbb-3(b)(1), unless the authorization is terminated or revoked.  Performed at [facility], [address] [place], [place] [address]       Studies: No results  found.  Scheduled Meds:  (feeding supplement) PROSource Plus  30 mL Oral BID BM   acidophilus   Oral Daily   acyclovir  400 mg Oral BID   Amikacin Sulfate Liposome  590 mg Inhalation Daily   atorvastatin  20 mg Oral Daily   bisacodyl  10 mg Rectal Once   calcium carbonate  1 tablet Oral Daily   cholecalciferol  5,000 Units Oral Daily   Clofazimine  100 mg Oral Q supper   docusate sodium  100 mg Oral BID   dronabinol  2.5 mg Oral BID AC   enoxaparin (LOVENOX) injection  40 mg Subcutaneous Q24H   ethambutol  1,200 mg Oral Daily   feeding supplement  1 Container Oral TID BM   umeclidinium bromide  1 puff Inhalation Daily   And   fluticasone furoate-vilanterol  1 puff Inhalation Daily   lactose free nutrition  237 mL Oral TID WC   loratadine  10 mg Oral Daily   melatonin  10 mg Oral QHS   multivitamin with minerals  1 tablet Oral Daily   pantoprazole  40 mg Oral Daily   rifampin  600 mg Oral Daily    Continuous Infusions:  dextrose 5% lactated ringers 125 mL/hr at 09/22/21 0535   potassium chloride       LOS: 4 days     Cristal Deer, MD [facility]  To reach me or the doctor on call, go to: [email or web address] Password [email or web address]  09/22/2021, 12:53 PM

## 2021-09-23 ENCOUNTER — Inpatient Hospital Stay (HOSPITAL_COMMUNITY): Payer: Medicare HMO

## 2021-09-23 DIAGNOSIS — K56609 Unspecified intestinal obstruction, unspecified as to partial versus complete obstruction: Secondary | ICD-10-CM | POA: Diagnosis not present

## 2021-09-23 LAB — BASIC METABOLIC PANEL
Anion gap: 6 (ref 5–15)
BUN: <5 mg/dL — ABNORMAL LOW (ref 8–23)
CO2: 27 mmol/L (ref 22–32)
Calcium: 8 mg/dL — ABNORMAL LOW (ref 8.9–10.3)
Chloride: 100 mmol/L (ref 98–111)
Creatinine, Ser: 0.52 mg/dL (ref 0.44–1.00)
GFR, Estimated: >60 mL/min (ref >60)
Glucose, Bld: 121 mg/dL — ABNORMAL HIGH (ref 70–99)
Potassium: 3.5 mmol/L (ref 3.5–5.1)
Sodium: 133 mmol/L — ABNORMAL LOW (ref 135–145)

## 2021-09-23 LAB — MAGNESIUM: Magnesium: 1.5 mg/dL — ABNORMAL LOW (ref 1.7–2.4)

## 2021-09-23 MED ORDER — POLYETHYLENE GLYCOL 3350 17 G PO PACK
17.0000 g | PACK | Freq: Once | ORAL | Status: AC
  Administered 2021-09-23: 17 g via ORAL

## 2021-09-23 MED ORDER — MAGNESIUM SULFATE 2 GM/50ML IV SOLN
2.0000 g | Freq: Once | INTRAVENOUS | Status: AC
  Administered 2021-09-23: 2 g via INTRAVENOUS
  Filled 2021-09-23: qty 50

## 2021-09-23 NOTE — Progress Notes (Addendum)
PROGRESS NOTE  Kiara Medina RKY:[id number] DOB: 1946-08-01 DOA: 09/18/2021 PCP: Nicoletta Dress, MD  HPI/Recap of past 37 hours: 75 year old female, with past medical history significant for Mycobacterium AVM complex pneumonia, chronic antibiotic suppressive therapy, status postcholecystectomy, bronchiectasis, who presented to the emergency department for evaluation of abdominal pain decreased bowel movement and a lot of gas with burping.  Her last bowel movement was 2 days prior to presentation she has been having significant nausea and vomiting and was unable to keep anything down.  The vomitus was said to be clear and nonbilious.  She has had multiple abdominal surgeries but has never had any small bowel obstruction.  She was transferred from bed points for further evaluation  Subjective: September 20, 2021 Patient seen and examined at bedside.  Patient was said to have been anxious and she refused NG tube placement because it was too big.  She does not have any nausea or vomiting currently.  She stated she has not passed gas or had has not had any bowel movement.  Also complained that the abdominal pain and distention is causing her shortness of breath Patient is on chronic suppressive therapy with medication for her MAC but says she has not been able to do that because of the sterilization process that is required and her cough has increased.  She is currently on ice chips only.  She was not able to tolerate her feeding.  She has also been able to get up and she says she walked with her daughter to the hallway  September 21, 2021: Patient seen and examined at bedside With finally able to put in an NG tube last night and is draining copiously Patient stated she feels a little better decreased shortness of breath  September 22, 2021: Patient seen and examined at bedside she still has an NG tube which is draining she stated she is having quite a bit of relief and was wondering when the NG tube can  be removed.  Currently the NG tube has 700 mL of drainage in it  September 23, 2021: Patient seen and examined at bedside daughter Kiara Medina is at bedside.  NG tube is still draining.  Currently has 500 mils from 8 PM last night but it is slowing down. She also stated she made small bowel movement.  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 Will attempt to put NG tube with a smaller size and lidocaine  The NG tube was successfully placed and is putting out quite a lot is currently slowing down. Repeat x-ray of the abdomen shows nonobstructive gas pattern but still occasional gas-filled small bowel  we will reevaluate tomorrow to see if it will be removed tomorrow September 24, 2021. Will order for you to be clamped  in the morning around 7 AM  2.  History of MAC and bronchiectasis Will resume medication once she is able to take orally  3.  Leukocytosis, hyponatremia likely from #1 and nausea she has not had any vomiting lately  4.  Hyperlipidemia Continue statin  once taking orally  5.  Anxiety patient was said to have been on Xanax but there is nothing on her record Am starting her on hydroxyzine  6.  GERD continue PPI  Code Status: Full  Severity of Illness: The appropriate patient status for this patient is INPATIENT. Inpatient status is judged to be reasonable and necessary in order to provide the required intensity of service to ensure the patient's safety. The patient's presenting symptoms, physical exam  findings, and initial radiographic and laboratory data in the context of their chronic comorbidities is felt to place them at high risk for further clinical deterioration. Furthermore, it is not anticipated that the patient will be medically stable for discharge from the hospital within 2 midnights of admission. The following factors support the patient status of inpatient.   " Small bowel obstruction * I certify that at the point of admission it is my clinical judgment that the patient will require inpatient hospital care spanning beyond 2 midnights from the point of admission due to high intensity of service, high risk for further deterioration and high frequency of surveillance required.*   Family Communication: Daughter Heather at bedside  Disposition Plan:   Status is: Inpatient   Dispo: The patient is from: Home              Anticipated d/c is to:               Anticipated d/c date is:               Patient currently not medically stable for discharge  Consultants: None  Procedures: None  Antimicrobials: None  DVT prophylaxis: Lovenox   Objective: Vitals:   09/22/21 0614 09/22/21 1351 09/22/21 2035 09/23/21 0519  BP: 138/70 (!) 142/85 (!) 145/87 137/79  Pulse: 86 87 83 73  Resp: _0 Temp: 98.8 F (37.1 C) 98.7 F (37.1 C) 98.4 F (36.9 C) 98.6 F (37 C)  TempSrc: Oral Oral Oral Oral  SpO2: 90% (!) 89% 92% 90%  Weight:      Height:        Intake/Output Summary (Last 24 hours) at 09/23/2021 1140 Last data filed at 09/23/2021 0840 Gross per 24 hour  Intake 1495.45 ml  Output 1500 ml  Net -4.55 ml    Filed Weights   09/18/21 1218  Weight: 47.6 kg   Body mass index is 18.6 kg/m.  Exam:  General: 74 y.o. year-old female well developed well nourished in no acute distress.  Alert and oriented x3. Cardiovascular: Regular rate and rhythm with no rubs or gallops.  No thyromegaly or JVD noted.   Respiratory: Clear to auscultation with no wheezes or  rales. Good inspiratory effort. Abdomen: Soft nontender nondistended with normal bowel sounds x4 quadrants. Musculoskeletal: No lower extremity edema. 2/4 pulses in all 4 extremities. Skin: No ulcerative lesions noted or rashes, Psychiatry: Mood is appropriate for condition and setting Neurology:    Data Reviewed: CBC: Recent Labs  Lab 09/18/21 1236 09/19/21 0536 09/21/21 0554  WBC 12.4* 7.8 9.8  HGB 14.3 12.2 12.5  HCT 42.4 36.1 36.9  MCV 91.0 91.6 90.0  PLT 376 297 481    Basic Metabolic Panel: Recent Labs  Lab 09/18/21 1236 09/19/21 0536 09/21/21 0554 09/22/21 0537 09/23/21 0453  NA 130* 136 133* 133* 133*  K 3.5 3.9 3.6 3.3* 3.5  CL 93* 107 99 100 100  CO2 _1 GLUCOSE 120* 118* 128* 113* 121*  BUN 12 8 7* <5* <5*  CREATININE 0.64 0.53 0.68 0.55 0.52  CALCIUM 9.5 8.9 8.8* 8.4* 8.0*  MG  --   --   --   --  1.5*    GFR: Estimated Creatinine Clearance: 44.3 mL/min (by C-G formula based on SCr of 0.52 mg/dL). Liver Function Tests: Recent Labs  Lab 09/18/21 1236 09/19/21 0536  AST 25 22  ALT 20 19  ALKPHOS 97 77  BILITOT 1.3* 0.5  PROT 7.3 6.3*  ALBUMIN 3.8 2.7*    Recent Labs  Lab 09/18/21 1236  LIPASE 30    No results for input(s): AMMONIA in the last 168 hours. Coagulation Profile: No results for input(s): INR, PROTIME in the last 168 hours. Cardiac Enzymes: No results for input(s): CKTOTAL, CKMB, CKMBINDEX, TROPONINI in the last 168 hours. BNP (last 3 results) No results for input(s): PROBNP in the last 8760 hours. HbA1C: No results for input(s): HGBA1C in the last 72 hours. CBG: No results for input(s): GLUCAP in the last 168 hours. Lipid Profile: No results for input(s): CHOL, HDL, LDLCALC, TRIG, CHOLHDL, LDLDIRECT in the last 72 hours. Thyroid Function Tests: No results for input(s): TSH, T4TOTAL, FREET4, T3FREE, THYROIDAB in the last 72 hours. Anemia Panel: No results for input(s): VITAMINB12, FOLATE, FERRITIN, TIBC,  IRON, RETICCTPCT in the last 72 hours. Urine analysis:    Component Value Date/Time   COLORURINE ORANGE (A) 09/18/2021 1237   APPEARANCEUR CLEAR 09/18/2021 1237   LABSPEC 1.005 09/18/2021 1237   PHURINE 6.0 09/18/2021 1237   GLUCOSEU NEGATIVE 09/18/2021 1237   HGBUR NEGATIVE 09/18/2021 1237   BILIRUBINUR NEGATIVE 09/18/2021 1237   KETONESUR NEGATIVE 09/18/2021 1237   PROTEINUR NEGATIVE 09/18/2021 1237   NITRITE NEGATIVE 09/18/2021 1237   LEUKOCYTESUR NEGATIVE 09/18/2021 1237   Sepsis Labs: _0 (procalcitonin:4,lacticidven:4)  ) Recent Results (from the past 240 hour(s))  Resp Panel by RT-PCR (Flu A&B, Covid) Nasopharyngeal Swab     Status: None   Collection Time: 09/18/21  4:38 PM   Specimen: Nasopharyngeal Swab; Nasopharyngeal(NP) swabs in vial transport medium  Result Value Ref Range Status   SARS Coronavirus 2 by RT PCR NEGATIVE NEGATIVE Final    Comment: (NOTE) SARS-CoV-2 target nucleic acids are NOT DETECTED.  The SARS-CoV-2 RNA is generally detectable in upper respiratory specimens during the acute phase of infection. The lowest concentration of SARS-CoV-2 viral copies this assay can detect is 138 copies/mL. A negative result does not preclude SARS-Cov-2 infection and should not be used as the sole basis for treatment or other patient management decisions. A negative result may occur with  improper specimen collection/handling, submission of specimen other than nasopharyngeal swab, presence of viral mutation(s) within the areas targeted by this assay, and inadequate number of viral copies(<138 copies/mL). A negative result must be combined with clinical observations, patient history, and epidemiological information. The expected result is Negative.  Fact Sheet for Patients:  EntrepreneurPulse.com.au  Fact Sheet for Healthcare Providers:  IncredibleEmployment.be  This test is no t yet approved or cleared by the [place] FDA and  has been authorized for detection and/or diagnosis of SARS-CoV-2 by FDA under an Emergency Use Authorization (EUA). This EUA will remain  in effect (meaning this test can be used) for the duration of the COVID-19 declaration under Section 564(b)(1) of the Act, 21 U.S.C.section 360bbb-3(b)(1), unless the authorization is terminated  or revoked sooner.       Influenza A by PCR NEGATIVE NEGATIVE Final   Influenza B by PCR NEGATIVE NEGATIVE  Final    Comment: (NOTE) The Xpert Xpress SARS-CoV-2/FLU/RSV plus assay is intended as an aid in the diagnosis of influenza from Nasopharyngeal swab specimens and should not be used as a sole basis for treatment. Nasal washings and aspirates are unacceptable for Xpert Xpress SARS-CoV-2/FLU/RSV testing.  Fact Sheet for Patients: EntrepreneurPulse.com.au  Fact Sheet for Healthcare Providers: IncredibleEmployment.be  This test is not yet approved or cleared by the [place] FDA and has been authorized for detection and/or diagnosis of SARS-CoV-2 by FDA under an Emergency Use Authorization (EUA). This EUA will remain in effect (meaning this test can be used) for the duration of the COVID-19 declaration under Section 564(b)(1) of the Act, 21 U.S.C. section 360bbb-3(b)(1), unless the authorization is terminated or revoked.  Performed at [facility], [address] [place], [place] [address]       Studies: DG Abd 1 View  Result Date: 09/23/2021 CLINICAL DATA:  Ileus EXAM: ABDOMEN - 1 VIEW COMPARISON:  09/20/2021 FINDINGS: Esophagogastric tube is looped in the stomach, tip and side port below the diaphragm, tip in the gastric fundus. Nonobstructive pattern of bowel gas, with occasional gas-filled, although nondistended loops of small bowel in the central abdomen. No free air on supine radiograph. IMPRESSION: 1. Esophagogastric tube is looped in the stomach, tip and side port below  the diaphragm, tip in the gastric fundus. 2. Nonobstructive pattern of bowel gas, with occasional gas-filled, although nondistended loops of small bowel in the central abdomen. Electronically Signed   By: Delanna Ahmadi M.D.   On: 09/23/2021 08:35    Scheduled Meds:  (feeding supplement) PROSource Plus  30 mL Oral BID BM   acidophilus   Oral Daily   acyclovir  400 mg Oral BID   Amikacin Sulfate Liposome  590 mg Inhalation Daily   atorvastatin  20 mg Oral Daily   bisacodyl  10 mg Rectal Once   calcium carbonate  1 tablet Oral Daily   cholecalciferol  5,000 Units Oral Daily   Clofazimine  100 mg Oral Q supper   docusate sodium  100 mg Oral BID   dronabinol  2.5 mg Oral BID AC   enoxaparin (LOVENOX) injection  40 mg Subcutaneous Q24H   ethambutol  1,200 mg Oral Daily   feeding supplement  1 Container Oral TID BM   umeclidinium bromide  1 puff Inhalation Daily   And   fluticasone furoate-vilanterol  1 puff Inhalation Daily   lactose free nutrition  237 mL Oral TID WC   loratadine  10 mg Oral Daily   melatonin  10 mg Oral QHS   multivitamin with minerals  1 tablet Oral Daily   pantoprazole  40 mg Oral Daily   rifampin  600 mg Oral Daily    Continuous Infusions:  dextrose 5% lactated ringers 125 mL/hr at 09/23/21 0332     LOS: 5 days     Cristal Deer, MD [facility]  To reach me or the doctor on call, go to: [email or web address] Password [email or web address]  09/23/2021, 11:40 AM

## 2021-09-24 DIAGNOSIS — E785 Hyperlipidemia, unspecified: Secondary | ICD-10-CM | POA: Diagnosis not present

## 2021-09-24 DIAGNOSIS — A310 Pulmonary mycobacterial infection: Secondary | ICD-10-CM | POA: Diagnosis not present

## 2021-09-24 DIAGNOSIS — J479 Bronchiectasis, uncomplicated: Secondary | ICD-10-CM | POA: Diagnosis not present

## 2021-09-24 DIAGNOSIS — K56609 Unspecified intestinal obstruction, unspecified as to partial versus complete obstruction: Secondary | ICD-10-CM | POA: Diagnosis not present

## 2021-09-24 LAB — CBC WITH DIFFERENTIAL/PLATELET
Abs Immature Granulocytes: 0.02 10*3/uL (ref 0.00–0.07)
Basophils Absolute: 0 10*3/uL (ref 0.0–0.1)
Basophils Relative: 0 %
Eosinophils Absolute: 0.2 10*3/uL (ref 0.0–0.5)
Eosinophils Relative: 3 %
HCT: 34.2 % — ABNORMAL LOW (ref 36.0–46.0)
Hemoglobin: 11.5 g/dL — ABNORMAL LOW (ref 12.0–15.0)
Immature Granulocytes: 0 %
Lymphocytes Relative: 24 %
Lymphs Abs: 1.3 10*3/uL (ref 0.7–4.0)
MCH: 30.8 pg (ref 26.0–34.0)
MCHC: 33.6 g/dL (ref 30.0–36.0)
MCV: 91.7 fL (ref 80.0–100.0)
Monocytes Absolute: 0.5 10*3/uL (ref 0.1–1.0)
Monocytes Relative: 9 %
Neutro Abs: 3.5 10*3/uL (ref 1.7–7.7)
Neutrophils Relative %: 64 %
Platelets: 220 10*3/uL (ref 150–400)
RBC: 3.73 MIL/uL — ABNORMAL LOW (ref 3.87–5.11)
RDW: 15 % (ref 11.5–15.5)
WBC: 5.5 10*3/uL (ref 4.0–10.5)
nRBC: 0 % (ref 0.0–0.2)

## 2021-09-24 LAB — BASIC METABOLIC PANEL
Anion gap: 6 (ref 5–15)
BUN: <5 mg/dL — ABNORMAL LOW (ref 8–23)
CO2: 26 mmol/L (ref 22–32)
Calcium: 8 mg/dL — ABNORMAL LOW (ref 8.9–10.3)
Chloride: 102 mmol/L (ref 98–111)
Creatinine, Ser: 0.53 mg/dL (ref 0.44–1.00)
GFR, Estimated: >60 mL/min (ref >60)
Glucose, Bld: 119 mg/dL — ABNORMAL HIGH (ref 70–99)
Potassium: 3.5 mmol/L (ref 3.5–5.1)
Sodium: 134 mmol/L — ABNORMAL LOW (ref 135–145)

## 2021-09-24 LAB — MAGNESIUM: Magnesium: 2 mg/dL (ref 1.7–2.4)

## 2021-09-24 MED ORDER — KETOROLAC TROMETHAMINE 15 MG/ML IJ SOLN
15.0000 mg | Freq: Four times a day (QID) | INTRAMUSCULAR | Status: DC | PRN
  Administered 2021-09-24 – 2021-09-27 (×8): 15 mg via INTRAVENOUS
  Filled 2021-09-24 (×9): qty 1

## 2021-09-24 MED ORDER — BISACODYL 10 MG RE SUPP
10.0000 mg | Freq: Every day | RECTAL | Status: DC | PRN
  Administered 2021-09-24: 10 mg via RECTAL
  Filled 2021-09-24: qty 1

## 2021-09-24 NOTE — Progress Notes (Signed)
PROGRESS NOTE  Kiara Medina FGH:[id number] DOB: 09/10/1946 DOA: 09/18/2021 PCP: Nicoletta Dress, MD  HPI/Recap of past 64 hours: 75 year old female, with past medical history significant for Mycobacterium AVM complex pneumonia, chronic antibiotic suppressive therapy, status postcholecystectomy, bronchiectasis, who presented to the emergency department for evaluation of abdominal pain decreased bowel movement and a lot of gas with burping.  Her last bowel movement was 2 days prior to presentation she has been having significant nausea and vomiting and was unable to keep anything down.  The vomitus was said to be clear and nonbilious.  She has had multiple abdominal surgeries but has never had any small bowel obstruction.  She was transferred from bed points for further evaluation  Subjective: She is overall feeling better today and is requesting to have NG tube removed.  Her NG tube was clamped yesterday and she has not had any nausea or vomiting.  Tolerating clear liquids.  She did have bowel movements yesterday, none today.  She is ambulating in the halls.  No abdominal pain.    Assessment/Plan: Principal Problem:   SBO (small bowel obstruction) (HCC) Active Problems:   BRONCHIECTASIS   Mycobacterium avium-intracellulare complex (HCC)   Dyslipidemia   GERD (gastroesophageal reflux disease)   Depression   Protein-calorie malnutrition, severe #1 small bowel obstruction Patient treated conservatively with bowel rest and had NG tube decompression -Reports having a bowel movement yesterday -She has not had any nausea or vomiting -NG tube clamped since yesterday and she is tolerating clear liquids -Denies any abdominal pain -We will discontinue NG tube and start on full liquids -Continue to advance diet as tolerated  2.  History of MAC and bronchiectasis Will resume medication once she is able to take orally  3.  Leukocytosis, hyponatremia likely from #1 and nausea she has not had any  vomiting lately -Improved  4.  Hyperlipidemia Continue statin once taking orally  5.  Anxiety.   Patient was said to have been on Xanax but there is nothing on her record She has been started on hydroxyzine  6.  GERD continue PPI  Code Status: Full  Severity of Illness: The appropriate patient status for this patient is INPATIENT. Inpatient status is judged to be reasonable and necessary in order to provide the required intensity of service to ensure the patient's safety. The patient's presenting symptoms, physical exam findings, and initial radiographic and laboratory data in the context of their chronic comorbidities is felt to place them at high risk for further clinical deterioration. Furthermore, it is not anticipated that the patient will be medically stable for discharge from the hospital within 2 midnights of admission. The following factors support the patient status of inpatient.   " Small bowel obstruction * I certify that at the point of admission it is my clinical judgment that the patient will require inpatient hospital care spanning beyond 2 midnights from the point of admission due to high intensity of service, high risk for further deterioration and high frequency of surveillance required.*   Family Communication: Daughter Heather at bedside  Disposition Plan:   Status is: Inpatient   Dispo: The patient is from: Home              Anticipated d/c is to: Home              Anticipated d/c date is:               Patient currently not medically stable for discharge  Consultants: None  Procedures: None  Antimicrobials: None  DVT prophylaxis: Lovenox   Objective: Vitals:   09/24/21 0442 09/24/21 0808 09/24/21 1504 09/24/21 2041  BP: 133/70  (!) 161/84 (!) 154/69  Pulse: 76  79 77  Resp: _0 Temp: 98.5 F (36.9 C)  98 F (36.7 C) 98.6 F (37 C)  TempSrc: Oral  Oral Oral  SpO2: 95% 95% 94% 95%  Weight:      Height:        Intake/Output Summary  (Last 24 hours) at 09/24/2021 2042 Last data filed at 09/24/2021 1420 Gross per 24 hour  Intake 2134.29 ml  Output 600 ml  Net 1534.29 ml   Filed Weights   09/18/21 1218  Weight: 47.6 kg   Body mass index is 18.6 kg/m.  Exam:  General: 75 y.o. year-old female well developed well nourished in no acute distress.  Alert and oriented x3. Cardiovascular: Regular rate and rhythm with no rubs or gallops.  No thyromegaly or JVD noted.   Respiratory: Clear to auscultation with no wheezes or rales. Good inspiratory effort. Abdomen: Soft nontender nondistended with normal bowel sounds x4 quadrants. Musculoskeletal: No lower extremity edema. 2/4 pulses in all 4 extremities. Skin: No ulcerative lesions noted or rashes, Psychiatry: Mood is appropriate for condition and setting Neurology:    Data Reviewed: CBC: Recent Labs  Lab 09/18/21 1236 09/19/21 0536 09/21/21 0554 09/24/21 0519  WBC 12.4* 7.8 9.8 5.5  NEUTROABS  --   --   --  3.5  HGB 14.3 12.2 12.5 11.5*  HCT 42.4 36.1 36.9 34.2*  MCV 91.0 91.6 90.0 91.7  PLT 376 297 241 086   Basic Metabolic Panel: Recent Labs  Lab 09/19/21 0536 09/21/21 0554 09/22/21 0537 09/23/21 0453 09/24/21 0519  NA 136 133* 133* 133* 134*  K 3.9 3.6 3.3* 3.5 3.5  CL 107 99 100 100 102  CO2 _1 GLUCOSE 118* 128* 113* 121* 119*  BUN 8 7* <5* <5* <5*  CREATININE 0.53 0.68 0.55 0.52 0.53  CALCIUM 8.9 8.8* 8.4* 8.0* 8.0*  MG  --   --   --  1.5* 2.0   GFR: Estimated Creatinine Clearance: 44.3 mL/min (by C-G formula based on SCr of 0.53 mg/dL). Liver Function Tests: Recent Labs  Lab 09/18/21 1236 09/19/21 0536  AST 25 22  ALT 20 19  ALKPHOS 97 77  BILITOT 1.3* 0.5  PROT 7.3 6.3*  ALBUMIN 3.8 2.7*   Recent Labs  Lab 09/18/21 1236  LIPASE 30   No results for input(s): AMMONIA in the last 168 hours. Coagulation Profile: No results for input(s): INR, PROTIME in the last 168 hours. Cardiac Enzymes: No results for  input(s): CKTOTAL, CKMB, CKMBINDEX, TROPONINI in the last 168 hours. BNP (last 3 results) No results for input(s): PROBNP in the last 8760 hours. HbA1C: No results for input(s): HGBA1C in the last 72 hours. CBG: No results for input(s): GLUCAP in the last 168 hours. Lipid Profile: No results for input(s): CHOL, HDL, LDLCALC, TRIG, CHOLHDL, LDLDIRECT in the last 72 hours. Thyroid Function Tests: No results for input(s): TSH, T4TOTAL, FREET4, T3FREE, THYROIDAB in the last 72 hours. Anemia Panel: No results for input(s): VITAMINB12, FOLATE, FERRITIN, TIBC, IRON, RETICCTPCT in the last 72 hours. Urine analysis:    Component Value Date/Time   COLORURINE ORANGE (A) 09/18/2021 1237   APPEARANCEUR CLEAR 09/18/2021 1237   LABSPEC 1.005 09/18/2021 1237   PHURINE 6.0 09/18/2021 1237   GLUCOSEU NEGATIVE  09/18/2021 [place] 09/18/2021 [place] 09/18/2021 1237   [place] 09/18/2021 1237   PROTEINUR NEGATIVE 09/18/2021 1237   NITRITE NEGATIVE 09/18/2021 1237   LEUKOCYTESUR NEGATIVE 09/18/2021 1237   Sepsis Labs: _0 (procalcitonin:4,lacticidven:4)  ) Recent Results (from the past 240 hour(s))  Resp Panel by RT-PCR (Flu A&B, Covid) Nasopharyngeal Swab     Status: None   Collection Time: 09/18/21  4:38 PM   Specimen: Nasopharyngeal Swab; Nasopharyngeal(NP) swabs in vial transport medium  Result Value Ref Range Status   SARS Coronavirus 2 by RT PCR NEGATIVE NEGATIVE Final    Comment: (NOTE) SARS-CoV-2 target nucleic acids are NOT DETECTED.  The SARS-CoV-2 RNA is generally detectable in upper respiratory specimens during the acute phase of infection. The lowest concentration of SARS-CoV-2 viral copies this assay can detect is 138 copies/mL. A negative result does not preclude SARS-Cov-2 infection and should not be used as the sole basis for treatment or other patient management decisions. A negative result may occur with  improper specimen  collection/handling, submission of specimen other than nasopharyngeal swab, presence of viral mutation(s) within the areas targeted by this assay, and inadequate number of viral copies(<138 copies/mL). A negative result must be combined with clinical observations, patient history, and epidemiological information. The expected result is Negative.  Fact Sheet for Patients:  EntrepreneurPulse.com.au  Fact Sheet for Healthcare Providers:  IncredibleEmployment.be  This test is no t yet approved or cleared by the [place] FDA and  has been authorized for detection and/or diagnosis of SARS-CoV-2 by FDA under an Emergency Use Authorization (EUA). This EUA will remain  in effect (meaning this test can be used) for the duration of the COVID-19 declaration under Section 564(b)(1) of the Act, 21 U.S.C.section 360bbb-3(b)(1), unless the authorization is terminated  or revoked sooner.       Influenza A by PCR NEGATIVE NEGATIVE Final   Influenza B by PCR NEGATIVE NEGATIVE Final    Comment: (NOTE) The Xpert Xpress SARS-CoV-2/FLU/RSV plus assay is intended as an aid in the diagnosis of influenza from Nasopharyngeal swab specimens and should not be used as a sole basis for treatment. Nasal washings and aspirates are unacceptable for Xpert Xpress SARS-CoV-2/FLU/RSV testing.  Fact Sheet for Patients: EntrepreneurPulse.com.au  Fact Sheet for Healthcare Providers: IncredibleEmployment.be  This test is not yet approved or cleared by the [place] FDA and has been authorized for detection and/or diagnosis of SARS-CoV-2 by FDA under an Emergency Use Authorization (EUA). This EUA will remain in effect (meaning this test can be used) for the duration of the COVID-19 declaration under Section 564(b)(1) of the Act, 21 U.S.C. section 360bbb-3(b)(1), unless the authorization is terminated or revoked.  Performed at [facility], [address] [place], [place] [address]       Studies: No results found.  Scheduled Meds:  (feeding supplement) PROSource Plus  30 mL Oral BID BM   acidophilus   Oral Daily   acyclovir  400 mg Oral BID   Amikacin Sulfate Liposome  590 mg Inhalation Daily   atorvastatin  20 mg Oral Daily   calcium carbonate  1 tablet Oral Daily   cholecalciferol  5,000 Units Oral Daily   Clofazimine  100 mg Oral Q supper   docusate sodium  100 mg Oral BID   dronabinol  2.5 mg Oral BID AC   enoxaparin (LOVENOX) injection  40 mg Subcutaneous Q24H   ethambutol  1,200 mg Oral Daily  feeding supplement  1 Container Oral TID BM   umeclidinium bromide  1 puff Inhalation Daily   And   fluticasone furoate-vilanterol  1 puff Inhalation Daily   lactose free nutrition  237 mL Oral TID WC   loratadine  10 mg Oral Daily   melatonin  10 mg Oral QHS   multivitamin with minerals  1 tablet Oral Daily   pantoprazole  40 mg Oral Daily   rifampin  600 mg Oral Daily    Continuous Infusions:  dextrose 5% lactated ringers 125 mL/hr at 09/24/21 1257     LOS: 6 days     Kathie Dike, MD [facility]  Password [facility]  09/24/2021, 8:42 PM

## 2021-09-25 DIAGNOSIS — E785 Hyperlipidemia, unspecified: Secondary | ICD-10-CM | POA: Diagnosis not present

## 2021-09-25 DIAGNOSIS — A310 Pulmonary mycobacterial infection: Secondary | ICD-10-CM | POA: Diagnosis not present

## 2021-09-25 DIAGNOSIS — K56609 Unspecified intestinal obstruction, unspecified as to partial versus complete obstruction: Secondary | ICD-10-CM | POA: Diagnosis not present

## 2021-09-25 DIAGNOSIS — J479 Bronchiectasis, uncomplicated: Secondary | ICD-10-CM | POA: Diagnosis not present

## 2021-09-25 LAB — BASIC METABOLIC PANEL
Anion gap: 6 (ref 5–15)
BUN: <5 mg/dL — ABNORMAL LOW (ref 8–23)
CO2: 26 mmol/L (ref 22–32)
Calcium: 8.5 mg/dL — ABNORMAL LOW (ref 8.9–10.3)
Chloride: 104 mmol/L (ref 98–111)
Creatinine, Ser: 0.53 mg/dL (ref 0.44–1.00)
GFR, Estimated: >60 mL/min (ref >60)
Glucose, Bld: 125 mg/dL — ABNORMAL HIGH (ref 70–99)
Potassium: 3.9 mmol/L (ref 3.5–5.1)
Sodium: 136 mmol/L (ref 135–145)

## 2021-09-25 MED ORDER — SIMETHICONE 80 MG PO CHEW
160.0000 mg | CHEWABLE_TABLET | Freq: Four times a day (QID) | ORAL | Status: DC | PRN
  Administered 2021-09-25 – 2021-09-26 (×4): 160 mg via ORAL
  Filled 2021-09-25 (×4): qty 2

## 2021-09-25 MED ORDER — DIATRIZOATE MEGLUMINE & SODIUM 66-10 % PO SOLN
90.0000 mL | Freq: Once | ORAL | Status: AC
  Administered 2021-09-25: 90 mL via ORAL
  Filled 2021-09-25: qty 90

## 2021-09-25 MED ORDER — METHYLPREDNISOLONE SODIUM SUCC 40 MG IJ SOLR
40.0000 mg | Freq: Every day | INTRAMUSCULAR | Status: DC
  Administered 2021-09-25 – 2021-09-28 (×4): 40 mg via INTRAVENOUS
  Filled 2021-09-25 (×4): qty 1

## 2021-09-25 NOTE — Progress Notes (Signed)
PROGRESS NOTE  Kiara Medina:[id number] DOB: Jul 18, 1946 DOA: 09/18/2021 PCP: Nicoletta Dress, MD  HPI/Recap of past 89 hours: 75 year old female, with past medical history significant for Mycobacterium AVM complex pneumonia, chronic antibiotic suppressive therapy, status postcholecystectomy, bronchiectasis, who presented to the emergency department for evaluation of abdominal pain decreased bowel movement and a lot of gas with burping.  Her last bowel movement was 2 days prior to presentation she has been having significant nausea and vomiting and was unable to keep anything down.  The vomitus was said to be clear and nonbilious.  She has had multiple abdominal surgeries but has never had any small bowel obstruction.  She was transferred from [facility] for further evaluation  Subjective: She was feeling better this morning and tolerating liquids.  She is having bowel movements this morning.  Diet has been advanced to soft diet.  Assessment/Plan: Principal Problem:   SBO (small bowel obstruction) (HCC) Active Problems:   BRONCHIECTASIS   Mycobacterium avium-intracellulare complex (HCC)   Dyslipidemia   GERD (gastroesophageal reflux disease)   Depression   Protein-calorie malnutrition, severe #1 small bowel obstruction Patient treated conservatively with bowel rest and had NG tube decompression -NG tube discontinued on 10/12 after she was having bowel movement tolerating clear liquids.  Diet was advanced to full liquids and she appeared to be tolerating this as well -On 10/13, started on soft diet and she had recurrent abdominal pain with vomiting -Diet changed back to clear liquids and will check small bowel protocol -If signs of persistent obstruction, would need to consult general surgery  2.  History of MAC and bronchiectasis Resumed on oral medications  3.  Leukocytosis, hyponatremia likely from #1 and nausea she has not had any vomiting lately -Improved  4.   Hyperlipidemia Continue statin once taking orally  5.  Anxiety.   Patient was said to have been on Xanax but there is nothing on her record She has been started on hydroxyzine  6.  GERD continue PPI  Code Status: Full  Severity of Illness: The appropriate patient status for this patient is INPATIENT. Inpatient status is judged to be reasonable and necessary in order to provide the required intensity of service to ensure the patient's safety. The patient's presenting symptoms, physical exam findings, and initial radiographic and laboratory data in the context of their chronic comorbidities is felt to place them at high risk for further clinical deterioration. Furthermore, it is not anticipated that the patient will be medically stable for discharge from the hospital within 2 midnights of admission. The following factors support the patient status of inpatient.   " Small bowel obstruction * I certify that at the point of admission it is my clinical judgment that the patient will require inpatient hospital care spanning beyond 2 midnights from the point of admission due to high intensity of service, high risk for further deterioration and high frequency of surveillance required.*   Family Communication: No family present today  Disposition Plan:   Status is: Inpatient   Dispo: The patient is from: Home              Anticipated d/c is to: Home              Anticipated d/c date is:               Patient currently not medically stable for discharge  Consultants: None  Procedures: None  Antimicrobials: None  DVT prophylaxis: Lovenox   Objective: Vitals:  09/24/21 2041 09/25/21 0446 09/25/21 0758 09/25/21 1345  BP: (!) 154/69 (!) 150/77  (!) 143/68  Pulse: 77 78  71  Resp: _0 Temp: 98.6 F (37 C) 98.1 F (36.7 C)  97.7 F (36.5 C)  TempSrc: Oral Oral  Oral  SpO2: 95% 96% 97% 99%  Weight:      Height:        Intake/Output Summary (Last 24 hours) at 09/25/2021  2008 Last data filed at 09/25/2021 1826 Gross per 24 hour  Intake 240 ml  Output --  Net 240 ml   Filed Weights   09/18/21 1218  Weight: 47.6 kg   Body mass index is 18.6 kg/m.  Exam:  General exam: Alert, awake, oriented x 3 Respiratory system: Clear to auscultation. Respiratory effort normal. Cardiovascular system:RRR. No murmurs, rubs, gallops. Gastrointestinal system: Abdomen is nondistended, soft and nontender. No organomegaly or masses felt. Normal bowel sounds heard. Central nervous system: Alert and oriented. No focal neurological deficits. Extremities: No C/C/E, +pedal pulses Skin: No rashes, lesions or ulcers Psychiatry: Judgement and insight appear normal. Mood & affect appropriate.      Data Reviewed: CBC: Recent Labs  Lab 09/19/21 0536 09/21/21 0554 09/24/21 0519  WBC 7.8 9.8 5.5  NEUTROABS  --   --  3.5  HGB 12.2 12.5 11.5*  HCT 36.1 36.9 34.2*  MCV 91.6 90.0 91.7  PLT 297 241 235   Basic Metabolic Panel: Recent Labs  Lab 09/21/21 0554 09/22/21 0537 09/23/21 0453 09/24/21 0519 09/25/21 0453  NA 133* 133* 133* 134* 136  K 3.6 3.3* 3.5 3.5 3.9  CL 99 100 100 102 104  CO2 _1 GLUCOSE 128* 113* 121* 119* 125*  BUN 7* <5* <5* <5* <5*  CREATININE 0.68 0.55 0.52 0.53 0.53  CALCIUM 8.8* 8.4* 8.0* 8.0* 8.5*  MG  --   --  1.5* 2.0  --    GFR: Estimated Creatinine Clearance: 44.3 mL/min (by C-G formula based on SCr of 0.53 mg/dL). Liver Function Tests: Recent Labs  Lab 09/19/21 0536  AST 22  ALT 19  ALKPHOS 77  BILITOT 0.5  PROT 6.3*  ALBUMIN 2.7*   No results for input(s): LIPASE, AMYLASE in the last 168 hours.  No results for input(s): AMMONIA in the last 168 hours. Coagulation Profile: No results for input(s): INR, PROTIME in the last 168 hours. Cardiac Enzymes: No results for input(s): CKTOTAL, CKMB, CKMBINDEX, TROPONINI in the last 168 hours. BNP (last 3 results) No results for input(s): PROBNP in the last 8760  hours. HbA1C: No results for input(s): HGBA1C in the last 72 hours. CBG: No results for input(s): GLUCAP in the last 168 hours. Lipid Profile: No results for input(s): CHOL, HDL, LDLCALC, TRIG, CHOLHDL, LDLDIRECT in the last 72 hours. Thyroid Function Tests: No results for input(s): TSH, T4TOTAL, FREET4, T3FREE, THYROIDAB in the last 72 hours. Anemia Panel: No results for input(s): VITAMINB12, FOLATE, FERRITIN, TIBC, IRON, RETICCTPCT in the last 72 hours. Urine analysis:    Component Value Date/Time   COLORURINE ORANGE (A) 09/18/2021 1237   APPEARANCEUR CLEAR 09/18/2021 1237   LABSPEC 1.005 09/18/2021 1237   PHURINE 6.0 09/18/2021 1237   GLUCOSEU NEGATIVE 09/18/2021 1237   HGBUR NEGATIVE 09/18/2021 1237   [place] 09/18/2021 1237   [place] 09/18/2021 1237   PROTEINUR NEGATIVE 09/18/2021 1237   NITRITE NEGATIVE 09/18/2021 1237   LEUKOCYTESUR NEGATIVE 09/18/2021 1237   Sepsis Labs: _2 (procalcitonin:4,lacticidven:4)  ) Recent  Results (from the past 240 hour(s))  Resp Panel by RT-PCR (Flu A&B, Covid) Nasopharyngeal Swab     Status: None   Collection Time: 09/18/21  4:38 PM   Specimen: Nasopharyngeal Swab; Nasopharyngeal(NP) swabs in vial transport medium  Result Value Ref Range Status   SARS Coronavirus 2 by RT PCR NEGATIVE NEGATIVE Final    Comment: (NOTE) SARS-CoV-2 target nucleic acids are NOT DETECTED.  The SARS-CoV-2 RNA is generally detectable in upper respiratory specimens during the acute phase of infection. The lowest concentration of SARS-CoV-2 viral copies this assay can detect is 138 copies/mL. A negative result does not preclude SARS-Cov-2 infection and should not be used as the sole basis for treatment or other patient management decisions. A negative result may occur with  improper specimen collection/handling, submission of specimen other than nasopharyngeal swab, presence of viral mutation(s) within the areas targeted by this  assay, and inadequate number of viral copies(<138 copies/mL). A negative result must be combined with clinical observations, patient history, and epidemiological information. The expected result is Negative.  Fact Sheet for Patients:  EntrepreneurPulse.com.au  Fact Sheet for Healthcare Providers:  IncredibleEmployment.be  This test is no t yet approved or cleared by the [place] FDA and  has been authorized for detection and/or diagnosis of SARS-CoV-2 by FDA under an Emergency Use Authorization (EUA). This EUA will remain  in effect (meaning this test can be used) for the duration of the COVID-19 declaration under Section 564(b)(1) of the Act, 21 U.S.C.section 360bbb-3(b)(1), unless the authorization is terminated  or revoked sooner.       Influenza A by PCR NEGATIVE NEGATIVE Final   Influenza B by PCR NEGATIVE NEGATIVE Final    Comment: (NOTE) The Xpert Xpress SARS-CoV-2/FLU/RSV plus assay is intended as an aid in the diagnosis of influenza from Nasopharyngeal swab specimens and should not be used as a sole basis for treatment. Nasal washings and aspirates are unacceptable for Xpert Xpress SARS-CoV-2/FLU/RSV testing.  Fact Sheet for Patients: EntrepreneurPulse.com.au  Fact Sheet for Healthcare Providers: IncredibleEmployment.be  This test is not yet approved or cleared by the [place] FDA and has been authorized for detection and/or diagnosis of SARS-CoV-2 by FDA under an Emergency Use Authorization (EUA). This EUA will remain in effect (meaning this test can be used) for the duration of the COVID-19 declaration under Section 564(b)(1) of the Act, 21 U.S.C. section 360bbb-3(b)(1), unless the authorization is terminated or revoked.  Performed at [facility], [address] [place], [place] [address]       Studies: No results found.  Scheduled Meds:  (feeding supplement)  PROSource Plus  30 mL Oral BID BM   acidophilus   Oral Daily   acyclovir  400 mg Oral BID   Amikacin Sulfate Liposome  590 mg Inhalation Daily   atorvastatin  20 mg Oral Daily   calcium carbonate  1 tablet Oral Daily   cholecalciferol  5,000 Units Oral Daily   Clofazimine  100 mg Oral Q supper   docusate sodium  100 mg Oral BID   dronabinol  2.5 mg Oral BID AC   enoxaparin (LOVENOX) injection  40 mg Subcutaneous Q24H   ethambutol  1,200 mg Oral Daily   feeding supplement  1 Container Oral TID BM   umeclidinium bromide  1 puff Inhalation Daily   And   fluticasone furoate-vilanterol  1 puff Inhalation Daily   lactose free nutrition  237 mL Oral TID WC   loratadine  10 mg  Oral Daily   melatonin  10 mg Oral QHS   methylPREDNISolone (SOLU-MEDROL) injection  40 mg Intravenous Daily   multivitamin with minerals  1 tablet Oral Daily   pantoprazole  40 mg Oral Daily   rifampin  600 mg Oral Daily    Continuous Infusions:     LOS: 7 days     Kathie Dike, MD [facility]    09/25/2021, 8:08 PM

## 2021-09-26 ENCOUNTER — Inpatient Hospital Stay (HOSPITAL_COMMUNITY): Payer: Medicare HMO

## 2021-09-26 DIAGNOSIS — E785 Hyperlipidemia, unspecified: Secondary | ICD-10-CM | POA: Diagnosis not present

## 2021-09-26 DIAGNOSIS — K56609 Unspecified intestinal obstruction, unspecified as to partial versus complete obstruction: Secondary | ICD-10-CM | POA: Diagnosis not present

## 2021-09-26 DIAGNOSIS — A310 Pulmonary mycobacterial infection: Secondary | ICD-10-CM | POA: Diagnosis not present

## 2021-09-26 DIAGNOSIS — J479 Bronchiectasis, uncomplicated: Secondary | ICD-10-CM | POA: Diagnosis not present

## 2021-09-26 LAB — BASIC METABOLIC PANEL
Anion gap: 9 (ref 5–15)
BUN: <5 mg/dL — ABNORMAL LOW (ref 8–23)
CO2: 25 mmol/L (ref 22–32)
Calcium: 8.5 mg/dL — ABNORMAL LOW (ref 8.9–10.3)
Chloride: 100 mmol/L (ref 98–111)
Creatinine, Ser: 0.55 mg/dL (ref 0.44–1.00)
GFR, Estimated: >60 mL/min (ref >60)
Glucose, Bld: 108 mg/dL — ABNORMAL HIGH (ref 70–99)
Potassium: 3.7 mmol/L (ref 3.5–5.1)
Sodium: 134 mmol/L — ABNORMAL LOW (ref 135–145)

## 2021-09-26 LAB — CBC
HCT: 37.7 % (ref 36.0–46.0)
Hemoglobin: 12.7 g/dL (ref 12.0–15.0)
MCH: 30.8 pg (ref 26.0–34.0)
MCHC: 33.7 g/dL (ref 30.0–36.0)
MCV: 91.3 fL (ref 80.0–100.0)
Platelets: 227 10*3/uL (ref 150–400)
RBC: 4.13 MIL/uL (ref 3.87–5.11)
RDW: 14.7 % (ref 11.5–15.5)
WBC: 6.8 10*3/uL (ref 4.0–10.5)
nRBC: 0 % (ref 0.0–0.2)

## 2021-09-26 MED ORDER — SODIUM CHLORIDE 0.9 % IV SOLN: INTRAVENOUS | Status: DC

## 2021-09-26 MED ORDER — SODIUM CHLORIDE 0.9 % IV SOLN
INTRAVENOUS | Status: DC
  Filled 2021-09-26 (×5): qty 1000

## 2021-09-26 NOTE — Care Management Important Message (Signed)
Important Message  Patient Details IM Letter given to the Patient. Name: KIANI WURTZEL MRN: [id number] Date of Birth: 29-Jan-1946   Medicare Important Message Given:  Yes     Kerin Salen 09/26/2021, 10:44 AM

## 2021-09-26 NOTE — Consult Note (Signed)
[facility] Surgery ConsultNote  SILVA AAMODT [birth year]  [id number].    Requesting MD: Kathie Dike Chief Complaint/Reason for Consult: SBO  HPI:  Kiara Medina is a 75yo female PMH Mycobacterium AVM complex pneumonia on chronic antibiotic suppressive therapy who was admitted to [facility] on 09/18/21 for a small bowel obstruction. She has been managed conservatively and general surgery is being called for consult today. She had an NG tube in place from 09/21/21 through 09/23/21. Bowel function returned and NG tube was removed and diet advanced as tolerated. Yesterday when she started on a soft diet her symptoms returned. She complains of diffuse abdominal distension and pain. She gets full very quickly with PO intake, even with liquids. She has had minimal to eat/ drink in 2 weeks. She is still passing some flatus and having loose stools. She was given oral gastrograffin yesterday and 8 hour delayed film showed no contrast entering the colon. General surgery asked to see.  Abdominal surgical history: appendectomy, cholecystectomy, tubal ligation  Review of Systems  Gastrointestinal:  Positive for abdominal pain, constipation, diarrhea, nausea and vomiting.   All systems reviewed and otherwise negative except for as above  Family History  Problem Relation Age of Onset   Emphysema Sister    Stroke Mother    Clotting disorder Mother     Past Medical History:  Diagnosis Date   Allergic rhinitis, cause unspecified    Bronchiectasis without acute exacerbation (HCC)    Cough    Sinusitis     Past Surgical History:  Procedure Laterality Date   APPENDECTOMY     CATARACT EXTRACTION  08/2013   CHOLECYSTECTOMY     TUBAL LIGATION     VESICOVAGINAL FISTULA CLOSURE W/ TAH      Social History:  reports that she quit smoking about 35 years ago. Her smoking use included cigarettes. She has a 20.00 pack-year smoking history. She has never used smokeless tobacco. She reports that she does not  currently use alcohol after a past usage of about 1.0 standard drink per week. She reports that she does not use drugs.  Allergies: No Known Allergies  Medications Prior to Admission  Medication Sig Dispense Refill   acetaminophen (TYLENOL) 500 MG tablet Take 1,000 mg by mouth every 6 (six) hours as needed for moderate pain.     acyclovir (ZOVIRAX) 400 MG tablet Take 400 mg by mouth 2 (two) times daily.     albuterol (VENTOLIN HFA) 108 (90 Base) MCG/ACT inhaler Inhale 2 puffs into the lungs every 6 (six) hours as needed for wheezing or shortness of breath.     Amikacin Sulfate Liposome (ARIKAYCE) 590 MG/8.4ML SUSP Inhale the contents of one vial (590 mg) via Lamira device 1 time daily as directed. (Patient taking differently: Inhale 590 mg into the lungs daily. Inhale the contents of one vial (590 mg) via Lamira device 1 time daily as directed.) 235.2 mL 28   ascorbic acid (VITAMIN C) 500 MG tablet Take 500 mg by mouth daily.     atorvastatin (LIPITOR) 20 MG tablet Take 20 mg by mouth daily.     B Complex Vitamins (B COMPLEX-B12 PO) Take 1 tablet by mouth daily.     benzonatate (TESSALON PERLES) 100 MG capsule Take 1 capsule (100 mg total) by mouth 3 (three) times daily as needed for cough. 90 capsule 5   calcium carbonate (OSCAL) 1500 (600 Ca) MG TABS tablet Take 600 mg of elemental calcium by mouth 2 (two) times daily with  a meal.     cetirizine (ZYRTEC) 10 MG tablet Take 10 mg by mouth daily.     Cholecalciferol (VITAMIN D3) 125 MCG (5000 UT) TABS Take 1 tablet by mouth daily.     CLOFAZIMINE PO Take 100 mg by mouth daily.     diazepam (VALIUM) 10 MG tablet Take 10 mg by mouth every 6 (six) hours as needed for anxiety (For MRI).     dronabinol (MARINOL) 2.5 MG capsule Take 1 capsule (2.5 mg total) by mouth 2 (two) times daily before a meal. 60 capsule 5   ethambutol (MYAMBUTOL) 400 MG tablet TAKE 3 TABLETS EVERY DAY (Patient taking differently: Take 1,200 mg by mouth daily.) 270 tablet 3    Flaxseed, Linseed, (FLAX SEED OIL) 1300 MG CAPS Take 1 tablet by mouth daily.     Fluticasone-Umeclidin-Vilant (TRELEGY ELLIPTA) 100-62.5-25 MCG/INH AEPB Inhale 1 puff into the lungs daily. 60 each 5   Melatonin 10 MG CAPS Take 10 mg by mouth at bedtime.     omeprazole (PRILOSEC) 40 MG capsule TAKE 1 CAPSULE TWICE DAILY (Patient taking differently: Take 40 mg by mouth daily.) 180 capsule 1   Probiotic Product (PROBIOTIC DAILY PO) Take 1 capsule by mouth daily.     Propylene Glycol (SYSTANE BALANCE OP) Place 1 drop into the left eye daily as needed (dry eyes).     rifampin (RIFADIN) 300 MG capsule Take 2 capsules (600 mg total) by mouth daily. 180 capsule 3   sodium chloride HYPERTONIC 3 % nebulizer solution INHALE THE CONTENTS OF 1 VIAL ( 4ML ) VIA NEBULIZER TWICE DAILY (Patient taking differently: Take 4 mLs by nebulization daily.) 720 mL 3   TURMERIC PO Take 2,000 mg by mouth daily.      zinc gluconate 50 MG tablet Take 50 mg by mouth daily.     Respiratory Therapy Supplies (FLUTTER) DEVI Use as directed. 1 each 0   Spacer/Aero-Holding Chambers (AEROCHAMBER MV) inhaler Use as instructed 1 each 2    Prior to Admission medications   Medication Sig Start Date End Date Taking? Authorizing Provider  acetaminophen (TYLENOL) 500 MG tablet Take 1,000 mg by mouth every 6 (six) hours as needed for moderate pain.   Yes [provider]  acyclovir (ZOVIRAX) 400 MG tablet Take 400 mg by mouth 2 (two) times daily.   Yes [provider]  albuterol (VENTOLIN HFA) 108 (90 Base) MCG/ACT inhaler Inhale 2 puffs into the lungs every 6 (six) hours as needed for wheezing or shortness of breath.   Yes [provider]  Amikacin Sulfate Liposome (ARIKAYCE) 590 MG/8.4ML SUSP Inhale the contents of one vial (590 mg) via Lamira device 1 time daily as directed. Patient taking differently: Inhale 590 mg into the lungs daily. Inhale the contents of one vial (590 mg) via Lamira device 1 time daily as  directed. 01/29/21  Yes Michel Bickers, MD  ascorbic acid (VITAMIN C) 500 MG tablet Take 500 mg by mouth daily.   Yes [provider]  atorvastatin (LIPITOR) 20 MG tablet Take 20 mg by mouth daily.   Yes [provider]  B Complex Vitamins (B COMPLEX-B12 PO) Take 1 tablet by mouth daily.   Yes [provider]  benzonatate (TESSALON PERLES) 100 MG capsule Take 1 capsule (100 mg total) by mouth 3 (three) times daily as needed for cough. 03/01/20  Yes Michel Bickers, MD  calcium carbonate (OSCAL) 1500 (600 Ca) MG TABS tablet Take 600 mg of elemental calcium by mouth  2 (two) times daily with a meal.   Yes [provider]  cetirizine (ZYRTEC) 10 MG tablet Take 10 mg by mouth daily.   Yes [provider]  Cholecalciferol (VITAMIN D3) 125 MCG (5000 UT) TABS Take 1 tablet by mouth daily.   Yes [provider]  CLOFAZIMINE PO Take 100 mg by mouth daily.   Yes [provider]  diazepam (VALIUM) 10 MG tablet Take 10 mg by mouth every 6 (six) hours as needed for anxiety (For MRI).   Yes [provider]  dronabinol (MARINOL) 2.5 MG capsule Take 1 capsule (2.5 mg total) by mouth 2 (two) times daily before a meal. 08/12/21  Yes Michel Bickers, MD  ethambutol (MYAMBUTOL) 400 MG tablet TAKE 3 TABLETS EVERY DAY Patient taking differently: Take 1,200 mg by mouth daily. 01/21/21  Yes Michel Bickers, MD  Flaxseed, Linseed, (FLAX SEED OIL) 1300 MG CAPS Take 1 tablet by mouth daily.   Yes [provider]  Fluticasone-Umeclidin-Vilant (TRELEGY ELLIPTA) 100-62.5-25 MCG/INH AEPB Inhale 1 puff into the lungs daily. 03/17/21  Yes Hunsucker, Bonna Gains, MD  Melatonin 10 MG CAPS Take 10 mg by mouth at bedtime.   Yes [provider]  omeprazole (PRILOSEC) 40 MG capsule TAKE 1 CAPSULE TWICE DAILY Patient taking differently: Take 40 mg by mouth daily. 09/11/21  Yes Hunsucker, Bonna Gains, MD  Probiotic Product (PROBIOTIC DAILY PO) Take 1 capsule by  mouth daily.   Yes [provider]  Propylene Glycol (SYSTANE BALANCE OP) Place 1 drop into the left eye daily as needed (dry eyes).   Yes [provider]  rifampin (RIFADIN) 300 MG capsule Take 2 capsules (600 mg total) by mouth daily. 01/21/21  Yes Michel Bickers, MD  sodium chloride HYPERTONIC 3 % nebulizer solution INHALE THE CONTENTS OF 1 VIAL ( 4ML ) VIA NEBULIZER TWICE DAILY Patient taking differently: Take 4 mLs by nebulization daily. 04/16/21  Yes Hunsucker, Bonna Gains, MD  TURMERIC PO Take 2,000 mg by mouth daily.    Yes [provider]  zinc gluconate 50 MG tablet Take 50 mg by mouth daily.   Yes [provider]  Respiratory Therapy Supplies (FLUTTER) DEVI Use as directed. 04/19/18   Parrett, Fonnie Mu, NP  Spacer/Aero-Holding Chambers (AEROCHAMBER MV) inhaler Use as instructed 03/24/18   Juanito Doom, MD    Blood pressure (!) 143/81, pulse 82, temperature 98 F (36.7 C), temperature source Oral, resp. rate 17, height 5\' 3"  (1.6 m), weight 47.6 kg, SpO2 97 %. Physical Exam: General: pleasant, WD/WN female who is laying in bed in NAD HEENT: head is normocephalic, atraumatic.  Sclera are noninjected.  Pupils equal and round.  Ears and nose without any masses or lesions.  Mouth is pink and moist. Dentition fair Heart: regular, rate, and rhythm.  Normal s1,s2. No obvious murmurs, gallops, or rubs noted.  Palpable pedal pulses bilaterally  Lungs: CTAB, no wheezes, rhonchi, or rales noted.  Respiratory effort nonlabored Abd: soft, mild distension, mild diffuse TTP without rebound or guarding, no masses, hernias, or organomegaly MS: no BUE/BLE edema, calves soft and nontender Skin: warm and dry with no masses, lesions, or rashes Psych: A&Ox4 with an appropriate affect Neuro: cranial nerves grossly intact, equal strength in BUE/BLE bilaterally, normal speech, thought process intact  Results for orders placed or performed during the hospital encounter of  09/18/21 (from the past 48 hour(s))  Basic metabolic panel     Status: Abnormal   Collection Time: 09/25/21  4:53  AM  Result Value Ref Range   Sodium 136 135 - 145 mmol/L   Potassium 3.9 3.5 - 5.1 mmol/L   Chloride 104 98 - 111 mmol/L   CO2 26 22 - 32 mmol/L   Glucose, Bld 125 (H) 70 - 99 mg/dL    Comment: Glucose reference range applies only to samples taken after fasting for at least 8 hours.   BUN <5 (L) 8 - 23 mg/dL   Creatinine, Ser 0.53 0.44 - 1.00 mg/dL   Calcium 8.5 (L) 8.9 - 10.3 mg/dL   GFR, Estimated >60 >60 mL/min    Comment: (NOTE) Calculated using the CKD-EPI Creatinine Equation (2021)    Anion gap 6 5 - 15    Comment: Performed at [facility], [place] [address], [place], [place] [address]  CBC     Status: None   Collection Time: 09/26/21  5:13 AM  Result Value Ref Range   WBC 6.8 4.0 - 10.5 K/uL   RBC 4.13 3.87 - 5.11 MIL/uL   Hemoglobin 12.7 12.0 - 15.0 g/dL   HCT 37.7 36.0 - 46.0 %   MCV 91.3 80.0 - 100.0 fL   MCH 30.8 26.0 - 34.0 pg   MCHC 33.7 30.0 - 36.0 g/dL   RDW 14.7 11.5 - 15.5 %   Platelets 227 150 - 400 K/uL   nRBC 0.0 0.0 - 0.2 %    Comment: Performed at [facility], [place] [address], [place], [place] [address]  Basic metabolic panel     Status: Abnormal   Collection Time: 09/26/21  5:13 AM  Result Value Ref Range   Sodium 134 (L) 135 - 145 mmol/L   Potassium 3.7 3.5 - 5.1 mmol/L   Chloride 100 98 - 111 mmol/L   CO2 25 22 - 32 mmol/L   Glucose, Bld 108 (H) 70 - 99 mg/dL    Comment: Glucose reference range applies only to samples taken after fasting for at least 8 hours.   BUN <5 (L) 8 - 23 mg/dL   Creatinine, Ser 0.55 0.44 - 1.00 mg/dL   Calcium 8.5 (L) 8.9 - 10.3 mg/dL   GFR, Estimated >60 >60 mL/min    Comment: (NOTE) Calculated using the CKD-EPI Creatinine Equation (2021)    Anion gap 9 5 - 15    Comment: Performed at [facility], [place] [address] [place], [place] [address]   DG Abd  Portable 1V-Small Bowel Obstruction Protocol-initial, 8 hr delay  Result Date: 09/26/2021 CLINICAL DATA:  Small-bowel obstruction EXAM: PORTABLE ABDOMEN - 1 VIEW COMPARISON:  09/23/2021 FINDINGS: Administered oral contrast opacifies multiple dilated loops of proximal and mid small bowel as well as the decompressed stomach. There is a paucity of gas within the colon, together suggesting a persistent small bowel obstruction. The previously noted nasogastric tube is been removed. Cholecystectomy clips are seen in the right upper quadrant. No organomegaly. IMPRESSION: Contrast opacifies multiple dilated loops of small bowel without visualization of the colon suggesting a distal small bowel obstruction. Electronically Signed   By: Fidela Salisbury M.D.   On: 09/26/2021 03:24      Assessment/Plan Partial SBO - Patient with persistent partial SBO 8 days after admission. She was started on the small bowel obstruction protocol yesterday and given oral gastrograffin. The 8 hour delayed film today shows contrast only into the small bowel, none in the colon. Will order a repeat film for tomorrow morning. Keep NPO. Will start some IV fluids.  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

## 2021-09-26 NOTE — Progress Notes (Signed)
Nutrition Follow-up  DOCUMENTATION CODES:   Severe malnutrition in context of chronic illness  INTERVENTION:  Continue to advance diet as medically able and as tolerated.  -If diet is unable to be advanced, recommend initiation of TPN given malnutrition. Pt cannot meet needs with CLD.   -Boost Breeze po TID, each supplement provides 250 kcal and 9 grams of protein.   -30 ml ProSource Plus po BID, each supplement provides 100 kcal and 15 grams of protein.     -MVI with minerals daily.   Will leave order for Boost Plus in case diet is advanced over the weekend.  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  Ongoing.  GOAL:   Patient will meet greater than or equal to 90% of their needs  Not meeting.  MONITOR:   Diet advancement, PO intake, Supplement acceptance, Labs, Weight trends, I & O's  ASSESSMENT:   75 yo female with a PMH of Mycobacterium avium complex pneumonia, bronchiectasis, chronic antibiotic suppressive therapy, status postcholecystectom presented to the ER with significant nausea vomiting and she is unable to keep anything down. Admitted with SBO.  10/6: admitted 10/12: NGT d/c, diet advanced CLD ->FLD 10/13: soft diet   Patient on clear liquids now as she was having vomiting while on soft diet yesterday. Only accepting 1 Boost Breeze daily and most Prosource supplements. Pt unable to meet needs on clear liquids even with supplements, will need TPN if diet is unable to be advanced.  Admission weight: 105 lbs. No new weight for admission.  I/Os: +12.2L since admit  Medications: Calcium carbonate, Vitamin D, Colace, Marinol  Labs reviewed:  Low Na  Diet Order:   Diet Order             Diet clear liquid Room service appropriate? Yes; Fluid consistency: Thin  Diet effective now                   EDUCATION NEEDS:   Education needs have been  addressed  Skin:  Skin Assessment: Reviewed RN Assessment  Last BM:  10/14  Height:   Ht Readings from Last 1 Encounters:  09/18/21 _0  (1.6 m)    Weight:   Wt Readings from Last 1 Encounters:  09/18/21 47.6 kg    BMI:  Body mass index is 18.6 kg/m.  Estimated Nutritional Needs:   Kcal:  1800-2000  Protein:  65-80 grams  Fluid:  >1.8 L   Clayton Bibles, MS, RD, LDN Inpatient Clinical Dietitian Contact information available via Amion

## 2021-09-26 NOTE — Progress Notes (Signed)
PROGRESS NOTE  Kiara Medina AJO:[id number] DOB: 1946/03/31 DOA: 09/18/2021 PCP: Nicoletta Dress, MD  HPI/Recap of past 12 hours: 75 year old female, with past medical history significant for Mycobacterium AVM complex pneumonia, chronic antibiotic suppressive therapy, status postcholecystectomy, bronchiectasis, who presented to the emergency department for evaluation of abdominal pain decreased bowel movement and a lot of gas with burping.  Her last bowel movement was 2 days prior to presentation she has been having significant nausea and vomiting and was unable to keep anything down.  The vomitus was said to be clear and nonbilious.  She has had multiple abdominal surgeries but has never had any small bowel obstruction.  She was transferred from [facility] for further evaluation  Subjective: Did not have any further vomiting after lunch yesterday, but has abdominal discomfort and distention after po intake since then. She is having bowel movements.  Assessment/Plan: Principal Problem:   SBO (small bowel obstruction) (HCC) Active Problems:   BRONCHIECTASIS   Mycobacterium avium-intracellulare complex (HCC)   Dyslipidemia   GERD (gastroesophageal reflux disease)   Depression   Protein-calorie malnutrition, severe #1 small bowel obstruction Patient treated conservatively with bowel rest and had NG tube decompression -NG tube discontinued on 10/12 after she was having bowel movement tolerating clear liquids.  Diet was advanced to full liquids and she appeared to be tolerating this as well -On 10/13, started on soft diet and she had recurrent abdominal pain with vomiting after eating lunch. She had soft foods for dinner and had some abdominal discomfort/distention but no vomiting -gastrograffin study performed overnight that did not show passage of contrast into colon indicating possible distal SBO -she is having bowel movements, but also did have some abdominal pressure and discomfort this  morning after having liquids for breakfast -will request formal general surgery input  2.  History of MAC and bronchiectasis Resumed on chronic medications  3.  Leukocytosis, hyponatremia likely from #1 and nausea she has not had any vomiting lately -Improved  4.  Hyperlipidemia Continue statin once taking orally  5.  Anxiety.   Patient was said to have been on Xanax but there is nothing on her record She has been started on hydroxyzine  6.  GERD continue PPI  Code Status: Full  Severity of Illness: The appropriate patient status for this patient is INPATIENT. Inpatient status is judged to be reasonable and necessary in order to provide the required intensity of service to ensure the patient's safety. The patient's presenting symptoms, physical exam findings, and initial radiographic and laboratory data in the context of their chronic comorbidities is felt to place them at high risk for further clinical deterioration. Furthermore, it is not anticipated that the patient will be medically stable for discharge from the hospital within 2 midnights of admission. The following factors support the patient status of inpatient.   " Small bowel obstruction * I certify that at the point of admission it is my clinical judgment that the patient will require inpatient hospital care spanning beyond 2 midnights from the point of admission due to high intensity of service, high risk for further deterioration and high frequency of surveillance required.*   Family Communication: updated daughter at the bedside  Disposition Plan:   Status is: Inpatient   Dispo: The patient is from: Home              Anticipated d/c is to: Home              Anticipated d/c date is:  Patient currently not medically stable for discharge  Consultants: None  Procedures: None  Antimicrobials: None  DVT prophylaxis: Lovenox   Objective: Vitals:   09/25/21 1345 09/25/21 2016 09/26/21 0509 09/26/21  0740  BP: (!) 143/68 (!) 153/71 139/68   Pulse: 71 75 71   Resp: _0 Temp: 97.7 F (36.5 C) 97.8 F (36.6 C) 98.1 F (36.7 C)   TempSrc: Oral Oral Oral   SpO2: 99% 97% 97% 97%  Weight:      Height:        Intake/Output Summary (Last 24 hours) at 09/26/2021 1248 Last data filed at 09/25/2021 1826 Gross per 24 hour  Intake 240 ml  Output --  Net 240 ml   Filed Weights   09/18/21 1218  Weight: 47.6 kg   Body mass index is 18.6 kg/m.  Exam:  General exam: Alert, awake, oriented x 3 Respiratory system: Clear to auscultation. Respiratory effort normal. Cardiovascular system:RRR. No murmurs, rubs, gallops. Gastrointestinal system: Abdomen is nondistended, soft and nontender. No organomegaly or masses felt. Normal bowel sounds heard. Central nervous system: Alert and oriented. No focal neurological deficits. Extremities: No C/C/E, +pedal pulses Skin: No rashes, lesions or ulcers Psychiatry: Judgement and insight appear normal. Mood & affect appropriate.      Data Reviewed: CBC: Recent Labs  Lab 09/21/21 0554 09/24/21 0519 09/26/21 0513  WBC 9.8 5.5 6.8  NEUTROABS  --  3.5  --   HGB 12.5 11.5* 12.7  HCT 36.9 34.2* 37.7  MCV 90.0 91.7 91.3  PLT 241 220 875   Basic Metabolic Panel: Recent Labs  Lab 09/22/21 0537 09/23/21 0453 09/24/21 0519 09/25/21 0453 09/26/21 0513  NA 133* 133* 134* 136 134*  K 3.3* 3.5 3.5 3.9 3.7  CL 100 100 102 104 100  CO2 _1 GLUCOSE 113* 121* 119* 125* 108*  BUN <5* <5* <5* <5* <5*  CREATININE 0.55 0.52 0.53 0.53 0.55  CALCIUM 8.4* 8.0* 8.0* 8.5* 8.5*  MG  --  1.5* 2.0  --   --    GFR: Estimated Creatinine Clearance: 44.3 mL/min (by C-G formula based on SCr of 0.55 mg/dL). Liver Function Tests: No results for input(s): AST, ALT, ALKPHOS, BILITOT, PROT, ALBUMIN in the last 168 hours.  No results for input(s): LIPASE, AMYLASE in the last 168 hours.  No results for input(s): AMMONIA in the last 168  hours. Coagulation Profile: No results for input(s): INR, PROTIME in the last 168 hours. Cardiac Enzymes: No results for input(s): CKTOTAL, CKMB, CKMBINDEX, TROPONINI in the last 168 hours. BNP (last 3 results) No results for input(s): PROBNP in the last 8760 hours. HbA1C: No results for input(s): HGBA1C in the last 72 hours. CBG: No results for input(s): GLUCAP in the last 168 hours. Lipid Profile: No results for input(s): CHOL, HDL, LDLCALC, TRIG, CHOLHDL, LDLDIRECT in the last 72 hours. Thyroid Function Tests: No results for input(s): TSH, T4TOTAL, FREET4, T3FREE, THYROIDAB in the last 72 hours. Anemia Panel: No results for input(s): VITAMINB12, FOLATE, FERRITIN, TIBC, IRON, RETICCTPCT in the last 72 hours. Urine analysis:    Component Value Date/Time   COLORURINE ORANGE (A) 09/18/2021 1237   APPEARANCEUR CLEAR 09/18/2021 1237   LABSPEC 1.005 09/18/2021 1237   PHURINE 6.0 09/18/2021 [place] 09/18/2021 1237   HGBUR NEGATIVE 09/18/2021 1237   [place] 09/18/2021 [place] 09/18/2021 1237   PROTEINUR NEGATIVE 09/18/2021 1237   NITRITE NEGATIVE 09/18/2021  [place] 09/18/2021 1237   Sepsis Labs: _0 (procalcitonin:4,lacticidven:4)  ) Recent Results (from the past 240 hour(s))  Resp Panel by RT-PCR (Flu A&B, Covid) Nasopharyngeal Swab     Status: None   Collection Time: 09/18/21  4:38 PM   Specimen: Nasopharyngeal Swab; Nasopharyngeal(NP) swabs in vial transport medium  Result Value Ref Range Status   SARS Coronavirus 2 by RT PCR NEGATIVE NEGATIVE Final    Comment: (NOTE) SARS-CoV-2 target nucleic acids are NOT DETECTED.  The SARS-CoV-2 RNA is generally detectable in upper respiratory specimens during the acute phase of infection. The lowest concentration of SARS-CoV-2 viral copies this assay can detect is 138 copies/mL. A negative result does not preclude SARS-Cov-2 infection and should not be used  as the sole basis for treatment or other patient management decisions. A negative result may occur with  improper specimen collection/handling, submission of specimen other than nasopharyngeal swab, presence of viral mutation(s) within the areas targeted by this assay, and inadequate number of viral copies(<138 copies/mL). A negative result must be combined with clinical observations, patient history, and epidemiological information. The expected result is Negative.  Fact Sheet for Patients:  EntrepreneurPulse.com.au  Fact Sheet for Healthcare Providers:  IncredibleEmployment.be  This test is no t yet approved or cleared by the [place] FDA and  has been authorized for detection and/or diagnosis of SARS-CoV-2 by FDA under an Emergency Use Authorization (EUA). This EUA will remain  in effect (meaning this test can be used) for the duration of the COVID-19 declaration under Section 564(b)(1) of the Act, 21 U.S.C.section 360bbb-3(b)(1), unless the authorization is terminated  or revoked sooner.       Influenza A by PCR NEGATIVE NEGATIVE Final   Influenza B by PCR NEGATIVE NEGATIVE Final    Comment: (NOTE) The Xpert Xpress SARS-CoV-2/FLU/RSV plus assay is intended as an aid in the diagnosis of influenza from Nasopharyngeal swab specimens and should not be used as a sole basis for treatment. Nasal washings and aspirates are unacceptable for Xpert Xpress SARS-CoV-2/FLU/RSV testing.  Fact Sheet for Patients: EntrepreneurPulse.com.au  Fact Sheet for Healthcare Providers: IncredibleEmployment.be  This test is not yet approved or cleared by the [place] FDA and has been authorized for detection and/or diagnosis of SARS-CoV-2 by FDA under an Emergency Use Authorization (EUA). This EUA will remain in effect (meaning this test can be used) for the duration of the COVID-19 declaration under Section 564(b)(1)  of the Act, 21 U.S.C. section 360bbb-3(b)(1), unless the authorization is terminated or revoked.  Performed at [facility], [address] [place], [place] [address]       Studies: DG Abd Portable 1V-Small Bowel Obstruction Protocol-initial, 8 hr delay  Result Date: 09/26/2021 CLINICAL DATA:  Small-bowel obstruction EXAM: PORTABLE ABDOMEN - 1 VIEW COMPARISON:  09/23/2021 FINDINGS: Administered oral contrast opacifies multiple dilated loops of proximal and mid small bowel as well as the decompressed stomach. There is a paucity of gas within the colon, together suggesting a persistent small bowel obstruction. The previously noted nasogastric tube is been removed. Cholecystectomy clips are seen in the right upper quadrant. No organomegaly. IMPRESSION: Contrast opacifies multiple dilated loops of small bowel without visualization of the colon suggesting a distal small bowel obstruction. Electronically Signed   By: Fidela Salisbury M.D.   On: 09/26/2021 03:24    Scheduled Meds:  (feeding supplement) PROSource Plus  30 mL Oral BID BM   acidophilus   Oral Daily   acyclovir  400  mg Oral BID   Amikacin Sulfate Liposome  590 mg Inhalation Daily   atorvastatin  20 mg Oral Daily   calcium carbonate  1 tablet Oral Daily   cholecalciferol  5,000 Units Oral Daily   Clofazimine  100 mg Oral Q supper   docusate sodium  100 mg Oral BID   dronabinol  2.5 mg Oral BID AC   enoxaparin (LOVENOX) injection  40 mg Subcutaneous Q24H   ethambutol  1,200 mg Oral Daily   feeding supplement  1 Container Oral TID BM   umeclidinium bromide  1 puff Inhalation Daily   And   fluticasone furoate-vilanterol  1 puff Inhalation Daily   lactose free nutrition  237 mL Oral TID WC   loratadine  10 mg Oral Daily   melatonin  10 mg Oral QHS   methylPREDNISolone (SOLU-MEDROL) injection  40 mg Intravenous Daily   multivitamin with minerals  1 tablet Oral Daily   pantoprazole  40 mg Oral Daily   rifampin  600 mg  Oral Daily    Continuous Infusions:     LOS: 8 days     Kathie Dike, MD [facility]    09/26/2021, 12:48 PM

## 2021-09-27 ENCOUNTER — Inpatient Hospital Stay (HOSPITAL_COMMUNITY): Payer: Medicare HMO

## 2021-09-27 DIAGNOSIS — K56609 Unspecified intestinal obstruction, unspecified as to partial versus complete obstruction: Secondary | ICD-10-CM | POA: Diagnosis not present

## 2021-09-27 DIAGNOSIS — A310 Pulmonary mycobacterial infection: Secondary | ICD-10-CM | POA: Diagnosis not present

## 2021-09-27 DIAGNOSIS — E785 Hyperlipidemia, unspecified: Secondary | ICD-10-CM | POA: Diagnosis not present

## 2021-09-27 DIAGNOSIS — J479 Bronchiectasis, uncomplicated: Secondary | ICD-10-CM | POA: Diagnosis not present

## 2021-09-27 LAB — PREALBUMIN: Prealbumin: 11.1 mg/dL — ABNORMAL LOW (ref 18–38)

## 2021-09-27 LAB — BASIC METABOLIC PANEL
Anion gap: 8 (ref 5–15)
BUN: 7 mg/dL — ABNORMAL LOW (ref 8–23)
CO2: 25 mmol/L (ref 22–32)
Calcium: 7.9 mg/dL — ABNORMAL LOW (ref 8.9–10.3)
Chloride: 104 mmol/L (ref 98–111)
Creatinine, Ser: 0.54 mg/dL (ref 0.44–1.00)
GFR, Estimated: >60 mL/min (ref >60)
Glucose, Bld: 87 mg/dL (ref 70–99)
Potassium: 3.2 mmol/L — ABNORMAL LOW (ref 3.5–5.1)
Sodium: 137 mmol/L (ref 135–145)

## 2021-09-27 LAB — CBC
HCT: 35.5 % — ABNORMAL LOW (ref 36.0–46.0)
Hemoglobin: 11.9 g/dL — ABNORMAL LOW (ref 12.0–15.0)
MCH: 30.7 pg (ref 26.0–34.0)
MCHC: 33.5 g/dL (ref 30.0–36.0)
MCV: 91.5 fL (ref 80.0–100.0)
Platelets: 229 10*3/uL (ref 150–400)
RBC: 3.88 MIL/uL (ref 3.87–5.11)
RDW: 15 % (ref 11.5–15.5)
WBC: 7.1 10*3/uL (ref 4.0–10.5)
nRBC: 0 % (ref 0.0–0.2)

## 2021-09-27 LAB — MAGNESIUM: Magnesium: 1.6 mg/dL — ABNORMAL LOW (ref 1.7–2.4)

## 2021-09-27 MED ORDER — MAGNESIUM SULFATE 4 GM/100ML IV SOLN
4.0000 g | Freq: Once | INTRAVENOUS | Status: AC
  Administered 2021-09-27: 4 g via INTRAVENOUS
  Filled 2021-09-27: qty 100

## 2021-09-27 MED ORDER — POTASSIUM CHLORIDE CRYS ER 20 MEQ PO TBCR
40.0000 meq | EXTENDED_RELEASE_TABLET | ORAL | Status: AC
  Administered 2021-09-27 (×2): 40 meq via ORAL
  Filled 2021-09-27 (×2): qty 2

## 2021-09-27 MED ORDER — SODIUM CHLORIDE 3 % IN NEBU
4.0000 mL | INHALATION_SOLUTION | Freq: Every day | RESPIRATORY_TRACT | Status: DC
  Administered 2021-09-28: 4 mL via RESPIRATORY_TRACT
  Filled 2021-09-27 (×2): qty 4

## 2021-09-27 NOTE — Progress Notes (Signed)
PROGRESS NOTE  Kiara Medina DGU:[id number] DOB: 02-13-1946 DOA: 09/18/2021 PCP: Nicoletta Dress, MD  HPI/Recap of past 63 hours: 75 year old female, with past medical history significant for Mycobacterium AVM complex pneumonia, chronic antibiotic suppressive therapy, status postcholecystectomy, bronchiectasis, who presented to the emergency department for evaluation of abdominal pain decreased bowel movement and a lot of gas with burping.  Her last bowel movement was 2 days prior to presentation she has been having significant nausea and vomiting and was unable to keep anything down.  The vomitus was said to be clear and nonbilious.  She has had multiple abdominal surgeries but has never had any small bowel obstruction.  She was transferred from [facility] for further evaluation  Subjective: Overall she is feeling better.  No abdominal pain or vomiting.  Tolerating clear liquids.  Assessment/Plan: Principal Problem:   SBO (small bowel obstruction) (HCC) Active Problems:   BRONCHIECTASIS   Mycobacterium avium-intracellulare complex (HCC)   Dyslipidemia   GERD (gastroesophageal reflux disease)   Depression   Protein-calorie malnutrition, severe #1 small bowel obstruction Patient treated conservatively with bowel rest and had NG tube decompression -NG tube discontinued on 10/12 after she was having bowel movement tolerating clear liquids.  Diet was advanced to full liquids and she appeared to be tolerating this as well -On 10/13, started on soft diet and she had recurrent abdominal pain with vomiting after eating lunch. She had soft foods for dinner and had some abdominal discomfort/distention but no vomiting -gastrograffin study performed did not show passage of contrast into colon indicating possible distal SBO -She was seen by general surgery who recommended repeat abdominal x-ray for 24 hours.  Repeat study indicated passage of contrast into the colon indicating resolution of  SBO -Clinically, the patient feels better and has been started on clear liquids -Since she is tolerating liquids for breakfast and lunch, will transition to full liquids for dinner  2.  History of MAC and bronchiectasis Resumed on chronic medications  3.  Leukocytosis, hyponatremia likely from #1 and nausea she has not had any vomiting lately -Improved  4.  Hyperlipidemia Continue statin once taking orally  5.  Anxiety.   Patient was said to have been on Xanax but there is nothing on her record She has been started on hydroxyzine  6.  GERD continue PPI  7.  Low back pain secondary to radiculopathy -Started on Solu-Medrol with improvement  Code Status: Full  Severity of Illness: The appropriate patient status for this patient is INPATIENT. Inpatient status is judged to be reasonable and necessary in order to provide the required intensity of service to ensure the patient's safety. The patient's presenting symptoms, physical exam findings, and initial radiographic and laboratory data in the context of their chronic comorbidities is felt to place them at high risk for further clinical deterioration. Furthermore, it is not anticipated that the patient will be medically stable for discharge from the hospital within 2 midnights of admission. The following factors support the patient status of inpatient.   " Small bowel obstruction * I certify that at the point of admission it is my clinical judgment that the patient will require inpatient hospital care spanning beyond 2 midnights from the point of admission due to high intensity of service, high risk for further deterioration and high frequency of surveillance required.*   Family Communication: updated sister at the bedside  Disposition Plan:   Status is: Inpatient   Dispo: The patient is from: Home  Anticipated d/c is to: Home              Anticipated d/c date is:               Patient currently not medically stable for  discharge  Consultants: General surgery  Procedures: None  Antimicrobials: On chronic antibiotics for MAC  DVT prophylaxis: Lovenox   Objective: Vitals:   09/26/21 1330 09/26/21 2055 09/27/21 0431 09/27/21 1342  BP: (!) 143/81 (!) 141/71 123/65 135/71  Pulse: 82 78 77 86  Resp: _0 Temp: 98 F (36.7 C) 97.7 F (36.5 C) 97.7 F (36.5 C) 98.1 F (36.7 C)  TempSrc: Oral Oral Oral Oral  SpO2: 97% 98% 98% 99%  Weight:      Height:        Intake/Output Summary (Last 24 hours) at 09/27/2021 1943 Last data filed at 09/27/2021 1849 Gross per 24 hour  Intake 1080 ml  Output --  Net 1080 ml   Filed Weights   09/18/21 1218  Weight: 47.6 kg   Body mass index is 18.6 kg/m.  Exam:  General exam: Alert, awake, oriented x 3 Respiratory system: Clear to auscultation. Respiratory effort normal. Cardiovascular system:RRR. No murmurs, rubs, gallops. Gastrointestinal system: Abdomen is nondistended, soft and nontender. No organomegaly or masses felt. Normal bowel sounds heard. Central nervous system: Alert and oriented. No focal neurological deficits. Extremities: No C/C/E, +pedal pulses Skin: No rashes, lesions or ulcers Psychiatry: Judgement and insight appear normal. Mood & affect appropriate.    Data Reviewed: CBC: Recent Labs  Lab 09/21/21 0554 09/24/21 0519 09/26/21 0513 09/27/21 0539  WBC 9.8 5.5 6.8 7.1  NEUTROABS  --  3.5  --   --   HGB 12.5 11.5* 12.7 11.9*  HCT 36.9 34.2* 37.7 35.5*  MCV 90.0 91.7 91.3 91.5  PLT 241 220 227 397   Basic Metabolic Panel: Recent Labs  Lab 09/23/21 0453 09/24/21 0519 09/25/21 0453 09/26/21 0513 09/27/21 0539  NA 133* 134* 136 134* 137  K 3.5 3.5 3.9 3.7 3.2*  CL 100 102 104 100 104  CO2 _1 GLUCOSE 121* 119* 125* 108* 87  BUN <5* <5* <5* <5* 7*  CREATININE 0.52 0.53 0.53 0.55 0.54  CALCIUM 8.0* 8.0* 8.5* 8.5* 7.9*  MG 1.5* 2.0  --   --  1.6*   GFR: Estimated Creatinine Clearance: 44.3  mL/min (by C-G formula based on SCr of 0.54 mg/dL). Liver Function Tests: No results for input(s): AST, ALT, ALKPHOS, BILITOT, PROT, ALBUMIN in the last 168 hours.  No results for input(s): LIPASE, AMYLASE in the last 168 hours.  No results for input(s): AMMONIA in the last 168 hours. Coagulation Profile: No results for input(s): INR, PROTIME in the last 168 hours. Cardiac Enzymes: No results for input(s): CKTOTAL, CKMB, CKMBINDEX, TROPONINI in the last 168 hours. BNP (last 3 results) No results for input(s): PROBNP in the last 8760 hours. HbA1C: No results for input(s): HGBA1C in the last 72 hours. CBG: No results for input(s): GLUCAP in the last 168 hours. Lipid Profile: No results for input(s): CHOL, HDL, LDLCALC, TRIG, CHOLHDL, LDLDIRECT in the last 72 hours. Thyroid Function Tests: No results for input(s): TSH, T4TOTAL, FREET4, T3FREE, THYROIDAB in the last 72 hours. Anemia Panel: No results for input(s): VITAMINB12, FOLATE, FERRITIN, TIBC, IRON, RETICCTPCT in the last 72 hours. Urine analysis:    Component Value Date/Time   COLORURINE ORANGE (A) 09/18/2021 1237   APPEARANCEUR  CLEAR 09/18/2021 1237   LABSPEC 1.005 09/18/2021 1237   PHURINE 6.0 09/18/2021 [place] 09/18/2021 1237   HGBUR NEGATIVE 09/18/2021 [place] 09/18/2021 1237   [place] 09/18/2021 1237   PROTEINUR NEGATIVE 09/18/2021 1237   NITRITE NEGATIVE 09/18/2021 1237   LEUKOCYTESUR NEGATIVE 09/18/2021 1237   Sepsis Labs: _0 (procalcitonin:4,lacticidven:4)  ) Recent Results (from the past 240 hour(s))  Resp Panel by RT-PCR (Flu A&B, Covid) Nasopharyngeal Swab     Status: None   Collection Time: 09/18/21  4:38 PM   Specimen: Nasopharyngeal Swab; Nasopharyngeal(NP) swabs in vial transport medium  Result Value Ref Range Status   SARS Coronavirus 2 by RT PCR NEGATIVE NEGATIVE Final    Comment: (NOTE) SARS-CoV-2 target nucleic acids are NOT DETECTED.  The  SARS-CoV-2 RNA is generally detectable in upper respiratory specimens during the acute phase of infection. The lowest concentration of SARS-CoV-2 viral copies this assay can detect is 138 copies/mL. A negative result does not preclude SARS-Cov-2 infection and should not be used as the sole basis for treatment or other patient management decisions. A negative result may occur with  improper specimen collection/handling, submission of specimen other than nasopharyngeal swab, presence of viral mutation(s) within the areas targeted by this assay, and inadequate number of viral copies(<138 copies/mL). A negative result must be combined with clinical observations, patient history, and epidemiological information. The expected result is Negative.  Fact Sheet for Patients:  EntrepreneurPulse.com.au  Fact Sheet for Healthcare Providers:  IncredibleEmployment.be  This test is no t yet approved or cleared by the [place] FDA and  has been authorized for detection and/or diagnosis of SARS-CoV-2 by FDA under an Emergency Use Authorization (EUA). This EUA will remain  in effect (meaning this test can be used) for the duration of the COVID-19 declaration under Section 564(b)(1) of the Act, 21 U.S.C.section 360bbb-3(b)(1), unless the authorization is terminated  or revoked sooner.       Influenza A by PCR NEGATIVE NEGATIVE Final   Influenza B by PCR NEGATIVE NEGATIVE Final    Comment: (NOTE) The Xpert Xpress SARS-CoV-2/FLU/RSV plus assay is intended as an aid in the diagnosis of influenza from Nasopharyngeal swab specimens and should not be used as a sole basis for treatment. Nasal washings and aspirates are unacceptable for Xpert Xpress SARS-CoV-2/FLU/RSV testing.  Fact Sheet for Patients: EntrepreneurPulse.com.au  Fact Sheet for Healthcare Providers: IncredibleEmployment.be  This test is not yet approved or  cleared by the [place] FDA and has been authorized for detection and/or diagnosis of SARS-CoV-2 by FDA under an Emergency Use Authorization (EUA). This EUA will remain in effect (meaning this test can be used) for the duration of the COVID-19 declaration under Section 564(b)(1) of the Act, 21 U.S.C. section 360bbb-3(b)(1), unless the authorization is terminated or revoked.  Performed at [facility], [address] [place], [place] [address]       Studies: DG Abd Portable 1V  Result Date: 09/27/2021 CLINICAL DATA:  Follow-up of small-bowel obstruction EXAM: PORTABLE ABDOMEN - 1 VIEW COMPARISON:  One day prior FINDINGS: Two supine views. Contrast has passed into the colon, which is normal in caliber. Gas and contrast filled small bowel loops including at up to 3.8 cm, improved. Cholecystectomy. No gross free intraperitoneal air. IMPRESSION: Improved to resolved small-bowel obstruction. Electronically Signed   By: Abigail Miyamoto M.D.   On: 09/27/2021 08:21    Scheduled Meds:  (feeding supplement) PROSource Plus  30 mL Oral  BID BM   acidophilus   Oral Daily   acyclovir  400 mg Oral BID   Amikacin Sulfate Liposome  590 mg Inhalation Daily   atorvastatin  20 mg Oral Daily   calcium carbonate  1 tablet Oral Daily   cholecalciferol  5,000 Units Oral Daily   Clofazimine  100 mg Oral Q supper   docusate sodium  100 mg Oral BID   dronabinol  2.5 mg Oral BID AC   enoxaparin (LOVENOX) injection  40 mg Subcutaneous Q24H   ethambutol  1,200 mg Oral Daily   feeding supplement  1 Container Oral TID BM   umeclidinium bromide  1 puff Inhalation Daily   And   fluticasone furoate-vilanterol  1 puff Inhalation Daily   lactose free nutrition  237 mL Oral TID WC   loratadine  10 mg Oral Daily   melatonin  10 mg Oral QHS   methylPREDNISolone (SOLU-MEDROL) injection  40 mg Intravenous Daily   multivitamin with minerals  1 tablet Oral Daily   pantoprazole  40 mg Oral Daily   rifampin   600 mg Oral Daily   [START ON 09/28/2021] sodium chloride HYPERTONIC  4 mL Nebulization Daily    Continuous Infusions:  0.9 % sodium chloride with kcl 75 mL/hr at 09/27/21 0913      LOS: 9 days     Kathie Dike, MD [facility]    09/27/2021, 7:43 PM

## 2021-09-27 NOTE — Progress Notes (Signed)
   Subjective/Chief Complaint: Still with some cramping pain but having multiple loose BM's   Objective: Vital signs in last 24 hours: Temp:  [97.7 F (36.5 C)-98 F (36.7 C)] 97.7 F (36.5 C) (10/15 0431) Pulse Rate:  [77-82] 77 (10/15 0431) Resp:  [14-17] 14 (10/15 0431) BP: (123-143)/(65-81) 123/65 (10/15 0431) SpO2:  [97 %-98 %] 98 % (10/15 0431) Last BM Date: 09/26/21  Intake/Output from previous day: 10/14 0701 - 10/15 0700 In: 134.4 [P.O.:120; I.V.:14.4] Out: -  Intake/Output this shift: No intake/output data recorded.  Exam: Awake and alert Abdomen soft, minimally full, minimally tender  Lab Results:  Recent Labs    09/26/21 0513 09/27/21 0539  WBC 6.8 7.1  HGB 12.7 11.9*  HCT 37.7 35.5*  PLT 227 229   BMET Recent Labs    09/26/21 0513 09/27/21 0539  NA 134* 137  K 3.7 3.2*  CL 100 104  CO2 25 25  GLUCOSE 108* 87  BUN <5* 7*  CREATININE 0.55 0.54  CALCIUM 8.5* 7.9*   PT/INR No results for input(s): LABPROT, INR in the last 72 hours. ABG No results for input(s): PHART, HCO3 in the last 72 hours.  Invalid input(s): PCO2, PO2  Studies/Results: DG Abd Portable 1V-Small Bowel Obstruction Protocol-initial, 8 hr delay  Result Date: 09/26/2021 CLINICAL DATA:  Small-bowel obstruction EXAM: PORTABLE ABDOMEN - 1 VIEW COMPARISON:  09/23/2021 FINDINGS: Administered oral contrast opacifies multiple dilated loops of proximal and mid small bowel as well as the decompressed stomach. There is a paucity of gas within the colon, together suggesting a persistent small bowel obstruction. The previously noted nasogastric tube is been removed. Cholecystectomy clips are seen in the right upper quadrant. No organomegaly. IMPRESSION: Contrast opacifies multiple dilated loops of small bowel without visualization of the colon suggesting a distal small bowel obstruction. Electronically Signed   By: Fidela Salisbury M.D.   On: 09/26/2021 03:24     Anti-infectives: Anti-infectives (From admission, onward)    Start     Dose/Rate Route Frequency Ordered Stop   09/19/21 1330  acyclovir (ZOVIRAX) tablet 400 mg        400 mg Oral 2 times daily 09/19/21 1241     09/19/21 1330  Amikacin Sulfate Liposome SUSP 590 mg        590 mg Inhalation Daily 09/19/21 1241     09/19/21 1330  ethambutol (MYAMBUTOL) tablet 1,200 mg       Note to Pharmacy: TAKE 3 TABLETS EVERY DAY     1,200 mg Oral Daily 09/19/21 1241     09/19/21 1330  rifampin (RIFADIN) capsule 600 mg        600 mg Oral Daily 09/19/21 1241         Assessment/Plan: Partial SBO  Xray this morning shows contrast now throughout the colon without a complete obstruction. Will start clear liquid diet again    Kiara Medina 09/27/2021

## 2021-09-28 DIAGNOSIS — M545 Low back pain, unspecified: Secondary | ICD-10-CM

## 2021-09-28 DIAGNOSIS — F419 Anxiety disorder, unspecified: Secondary | ICD-10-CM

## 2021-09-28 DIAGNOSIS — D72829 Elevated white blood cell count, unspecified: Secondary | ICD-10-CM

## 2021-09-28 DIAGNOSIS — K56609 Unspecified intestinal obstruction, unspecified as to partial versus complete obstruction: Secondary | ICD-10-CM | POA: Diagnosis not present

## 2021-09-28 DIAGNOSIS — Z0189 Encounter for other specified special examinations: Secondary | ICD-10-CM | POA: Diagnosis not present

## 2021-09-28 DIAGNOSIS — Z8619 Personal history of other infectious and parasitic diseases: Secondary | ICD-10-CM

## 2021-09-28 DIAGNOSIS — Z8709 Personal history of other diseases of the respiratory system: Secondary | ICD-10-CM

## 2021-09-28 DIAGNOSIS — E785 Hyperlipidemia, unspecified: Secondary | ICD-10-CM | POA: Diagnosis not present

## 2021-09-28 DIAGNOSIS — A310 Pulmonary mycobacterial infection: Secondary | ICD-10-CM | POA: Diagnosis not present

## 2021-09-28 DIAGNOSIS — M5410 Radiculopathy, site unspecified: Secondary | ICD-10-CM

## 2021-09-28 DIAGNOSIS — R110 Nausea: Secondary | ICD-10-CM

## 2021-09-28 DIAGNOSIS — E781 Pure hyperglyceridemia: Secondary | ICD-10-CM

## 2021-09-28 MED ORDER — OMEPRAZOLE 40 MG PO CPDR: 40.0000 mg | DELAYED_RELEASE_CAPSULE | Freq: Every day | ORAL | Status: DC

## 2021-09-28 MED ORDER — POLYETHYLENE GLYCOL 3350 17 G PO PACK: 17.0000 g | PACK | Freq: Every day | ORAL | 0 refills | Status: DC | PRN

## 2021-09-28 MED ORDER — DOCUSATE SODIUM 100 MG PO CAPS: 100.0000 mg | ORAL_CAPSULE | Freq: Two times a day (BID) | ORAL | 0 refills | Status: DC

## 2021-09-28 NOTE — Discharge Summary (Signed)
Physician Discharge Summary  Kiara Medina NFA:[id number] DOB: 05-03-1946 DOA: 09/18/2021  PCP: Nicoletta Dress, MD  Admit date: 09/18/2021 Discharge date: 09/28/2021  Admitted From: Home Disposition: Home  Recommendations for Outpatient Follow-up:  Follow up with PCP in 1-2 weeks Please obtain BMP/CBC in one week Follow-up with general surgery as needed  Home Health: Equipment/Devices:  Discharge Condition: Stable CODE STATUS: Full code Diet recommendation: Soft diet  Brief/Interim Summary: 75 year old female, with past medical history significant for Mycobacterium AVM complex pneumonia, chronic antibiotic suppressive therapy, status postcholecystectomy, bronchiectasis, who presented to the emergency department for evaluation of abdominal pain decreased bowel movement and a lot of gas with burping.  Her last bowel movement was 2 days prior to presentation she has been having significant nausea and vomiting and was unable to keep anything down.  The vomitus was said to be clear and nonbilious.  She has had multiple abdominal surgeries but has never had any small bowel obstruction.  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  Diet was advanced to full liquids and she appeared to be tolerating this as well -On 10/13, started on soft diet and she had recurrent abdominal pain with vomiting after eating lunch. She had soft foods for dinner and had some abdominal discomfort/distention but no  vomiting -gastrograffin study performed did not show passage of contrast into colon indicating possible distal SBO -She was seen by general surgery who recommended repeat abdominal x-ray in 24 hours.  Repeat study indicated passage of contrast into the colon indicating resolution of SBO -Her diet was advanced from clear liquids to soft foods and she tolerated this very well -She was cleared for discharge by general surgery  2.  History of MAC and bronchiectasis Resumed on chronic medications  3.  Leukocytosis, hyponatremia likely from #1 and nausea she has not had any vomiting lately -Improved  4.  Hyperlipidemia Continue statin once taking orally  5.  Anxiety.   Patient was said to have been on Xanax but there is nothing on her record She has been started on hydroxyzine  6.  GERD continue PPI   7.  Low back pain secondary to radiculopathy -Started on Solu-Medrol with improvement -She is seen by an orthopedic doctor in [place] and will continue on prednisone course at home  Discharge Instructions  Discharge Instructions     Diet general   Complete by: As directed    Increase activity slowly   Complete by: As directed       Allergies as of 09/28/2021   No Known Allergies      Medication List     TAKE these medications    acetaminophen 500 MG tablet Commonly known as: TYLENOL Take 1,000 mg by mouth every 6 (six) hours as needed for moderate pain.   acyclovir 400 MG tablet Commonly known as: ZOVIRAX Take 400 mg by mouth 2 (two) times daily.   AeroChamber MV inhaler Use as instructed   albuterol 108 (90 Base) MCG/ACT inhaler Commonly known as: VENTOLIN HFA Inhale 2 puffs into the lungs every 6 (six) hours as  needed for wheezing or shortness of breath.   Arikayce 590 MG/8.4ML Susp Generic drug: Amikacin Sulfate Liposome Inhale the contents of one vial (590 mg) via Lamira device 1 time daily as directed. What changed:  how much to take how to take  this when to take this   ascorbic acid 500 MG tablet Commonly known as: VITAMIN C Take 500 mg by mouth daily.   atorvastatin 20 MG tablet Commonly known as: LIPITOR Take 20 mg by mouth daily.   B COMPLEX-B12 PO Take 1 tablet by mouth daily.   benzonatate 100 MG capsule Commonly known as: Tessalon Perles Take 1 capsule (100 mg total) by mouth 3 (three) times daily as needed for cough.   calcium carbonate 1500 (600 Ca) MG Tabs tablet Commonly known as: OSCAL Take 600 mg of elemental calcium by mouth 2 (two) times daily with a meal.   cetirizine 10 MG tablet Commonly known as: ZYRTEC Take 10 mg by mouth daily.   CLOFAZIMINE PO Take 100 mg by mouth daily.   diazepam 10 MG tablet Commonly known as: VALIUM Take 10 mg by mouth every 6 (six) hours as needed for anxiety (For MRI).   docusate sodium 100 MG capsule Commonly known as: COLACE Take 1 capsule (100 mg total) by mouth 2 (two) times daily.   dronabinol 2.5 MG capsule Commonly known as: Marinol Take 1 capsule (2.5 mg total) by mouth 2 (two) times daily before a meal.   ethambutol 400 MG tablet Commonly known as: MYAMBUTOL TAKE 3 TABLETS EVERY DAY What changed:  how much to take how to take this when to take this additional instructions   Flax Seed Oil 1300 MG Caps Take 1 tablet by mouth daily.   Flutter Devi Use as directed.   Melatonin 10 MG Caps Take 10 mg by mouth at bedtime.   omeprazole 40 MG capsule Commonly known as: PRILOSEC Take 1 capsule (40 mg total) by mouth daily.   polyethylene glycol 17 g packet Commonly known as: MIRALAX / GLYCOLAX Take 17 g by mouth daily as needed for mild constipation.   PROBIOTIC DAILY PO Take 1 capsule by mouth daily.   rifampin 300 MG capsule Commonly known as: RIFADIN Take 2 capsules (600 mg total) by mouth daily.   sodium chloride HYPERTONIC 3 % nebulizer solution INHALE THE CONTENTS OF 1 VIAL ( 4ML ) VIA NEBULIZER TWICE DAILY What changed:  how much  to take how to take this when to take this additional instructions   SYSTANE BALANCE OP Place 1 drop into the left eye daily as needed (dry eyes).   Trelegy Ellipta 100-62.5-25 MCG/INH Aepb Generic drug: Fluticasone-Umeclidin-Vilant Inhale 1 puff into the lungs daily.   TURMERIC PO Take 2,000 mg by mouth daily.   Vitamin D3 125 MCG (5000 UT) Tabs Take 1 tablet by mouth daily.   zinc gluconate 50 MG tablet Take 50 mg by mouth daily.        No Known Allergies  Consultations: General surgery   Procedures/Studies: DG Abd 1 View  Result Date: 09/23/2021 CLINICAL DATA:  Ileus EXAM: ABDOMEN - 1 VIEW COMPARISON:  09/20/2021 FINDINGS: Esophagogastric tube is looped in the stomach, tip and side port below the diaphragm, tip in the gastric fundus. Nonobstructive pattern of bowel gas, with occasional gas-filled, although nondistended loops of small bowel in the central abdomen. No free air on supine radiograph. IMPRESSION: 1. Esophagogastric tube is looped in the stomach, tip and side port below the diaphragm, tip in  the gastric fundus. 2. Nonobstructive pattern of bowel gas, with occasional gas-filled, although nondistended loops of small bowel in the central abdomen. Electronically Signed   By: Delanna Ahmadi M.D.   On: 09/23/2021 08:35   DG Abd 1 View  Result Date: 09/20/2021 CLINICAL DATA:  NG tube placement. EXAM: ABDOMEN - 1 VIEW COMPARISON:  September 20, 2021 FINDINGS: Enteric catheter with tip overlying the expected location of gastric antrum. Persistent mild small bowel dilation. IMPRESSION: Negative. Electronically Signed   By: Fidela Salisbury M.D.   On: 09/20/2021 19:58   DG Abd 1 View  Result Date: 09/20/2021 CLINICAL DATA:  Small-bowel obstruction, abdominal pain, nausea EXAM: ABDOMEN - 1 VIEW COMPARISON:  09/19/2021 FINDINGS: Supine frontal view of the abdomen and pelvis demonstrates distended gas-filled loops of small bowel within the central abdomen, measuring to 4.0  cm in diameter. Minimal gas and stool throughout the colon. No masses or abnormal calcifications. Chronic bibasilar airspace disease identified at the lung bases. IMPRESSION: 1. Findings consistent with small-bowel obstruction, with increasing gaseous distention of the small bowel. 2. Persistent bibasilar airspace disease compatible with known chronic atypical infection. Electronically Signed   By: Randa Ngo M.D.   On: 09/20/2021 15:26   CT Abdomen Pelvis W Contrast  Result Date: 09/18/2021 CLINICAL DATA:  Acute generalized abdominal pain. Vomiting. Constipation. Decreased bowel sounds. Prior surgical history of appendectomy and cholecystectomy. EXAM: CT ABDOMEN AND PELVIS WITH CONTRAST TECHNIQUE: Multidetector CT imaging of the abdomen and pelvis was performed using the standard protocol following bolus administration of intravenous contrast. CONTRAST:  157m OMNIPAQUE IOHEXOL 300 MG/ML  [place]  [place] Chest: Bronchiectasis, peribronchial thickening, and numerous ill-defined nodular opacities are again seen in both lung bases. A few nodules show central cavitation, and previously seen dominant nodule in the posterior right lower lobe is decreased in size. These findings are consistent with atypical infectious or inflammatory etiology. Hepatobiliary: No hepatic masses identified. Prior cholecystectomy. No evidence of biliary obstruction. Pancreas:  No mass or inflammatory changes. Spleen: Within normal limits in size and appearance. Adrenals/Urinary Tract: No masses identified. No evidence of ureteral calculi or hydronephrosis. Unremarkable unopacified urinary bladder. Stomach/Bowel: Moderate dilatation of mid and distal small bowel is seen with transition point at the terminal ileum. A stricture is seen involving the terminal ileum, best seen on image 32/5 and 50/6, with mild adjacent mesenteric soft tissue stranding. This is suspicious for adhesion.  Mild ascites noted in the pelvis. Vascular/Lymphatic: No pathologically enlarged lymph nodes. No acute vascular findings. Aortic atherosclerotic calcification noted. Reproductive: Prior hysterectomy noted. Adnexal regions are unremarkable in appearance. Other:  None. Musculoskeletal:  No suspicious bone lesions identified. IMPRESSION: Distal small bowel obstruction, with transition point in the terminal ileum, highly suspicious for adhesion. Mild ascites. Chronic bronchiectasis, peribronchial thickening, and waxing and waning nodular opacities in both lung bases, consistent with atypical infectious or inflammatory etiology. Aortic Atherosclerosis (ICD10-I70.0). Electronically Signed   By: JMarlaine HindM.D.   On: 09/18/2021 15:52   DG Abd Portable 1V  Result Date: 09/27/2021 CLINICAL DATA:  Follow-up of small-bowel obstruction EXAM: PORTABLE ABDOMEN - 1 VIEW COMPARISON:  One day prior FINDINGS: Two supine views. Contrast has passed into the colon, which is normal in caliber. Gas and contrast filled small bowel loops including at up to 3.8 cm, improved. Cholecystectomy. No gross free intraperitoneal air. IMPRESSION: Improved to resolved small-bowel obstruction. Electronically Signed   By: KAbigail MiyamotoM.D.   On: 09/27/2021 08:21  DG Abd Portable 1V-Small Bowel Obstruction Protocol-initial, 8 hr delay  Result Date: 09/26/2021 CLINICAL DATA:  Small-bowel obstruction EXAM: PORTABLE ABDOMEN - 1 VIEW COMPARISON:  09/23/2021 FINDINGS: Administered oral contrast opacifies multiple dilated loops of proximal and mid small bowel as well as the decompressed stomach. There is a paucity of gas within the colon, together suggesting a persistent small bowel obstruction. The previously noted nasogastric tube is been removed. Cholecystectomy clips are seen in the right upper quadrant. No organomegaly. IMPRESSION: Contrast opacifies multiple dilated loops of small bowel without visualization of the colon suggesting a distal  small bowel obstruction. Electronically Signed   By: Fidela Salisbury M.D.   On: 09/26/2021 03:24   DG Abd Portable 1V  Result Date: 09/19/2021 CLINICAL DATA:  Abdominal pain. EXAM: PORTABLE ABDOMEN - 1 VIEW COMPARISON:  None. FINDINGS: The bowel gas pattern is normal. Mild amount of stool seen throughout the colon. No radio-opaque calculi or other significant radiographic abnormality are seen. IMPRESSION: No evidence of bowel obstruction or ileus. Electronically Signed   By: Marijo Conception M.D.   On: 09/19/2021 12:15      Subjective: She is feeling better today.  Tolerating solid diet.  No abdominal discomfort or vomiting.  Discharge Exam: Vitals:   09/27/21 2032 09/28/21 0407 09/28/21 0811 09/28/21 1409  BP: (!) 141/82 135/66  127/70  Pulse: 78 65  79  Resp: _0 Temp: 98 F (36.7 C) 97.7 F (36.5 C)  97.9 F (36.6 C)  TempSrc: Oral Oral  Oral  SpO2: 98% 97% 97% 97%  Weight:      Height:        General: Pt is alert, awake, not in acute distress Cardiovascular: RRR, S1/S2 +, no rubs, no gallops Respiratory: CTA bilaterally, no wheezing, no rhonchi Abdominal: Soft, NT, ND, bowel sounds + Extremities: no edema, no cyanosis    The results of significant diagnostics from this hospitalization (including imaging, microbiology, ancillary and laboratory) are listed below for reference.     Microbiology: No results found for this or any previous visit (from the past 240 hour(s)).   Labs: BNP (last 3 results) No results for input(s): BNP in the last 8760 hours. Basic Metabolic Panel: Recent Labs  Lab 09/23/21 0453 09/24/21 0519 09/25/21 0453 09/26/21 0513 09/27/21 0539  NA 133* 134* 136 134* 137  K 3.5 3.5 3.9 3.7 3.2*  CL 100 102 104 100 104  CO2 _1 GLUCOSE 121* 119* 125* 108* 87  BUN <5* <5* <5* <5* 7*  CREATININE 0.52 0.53 0.53 0.55 0.54  CALCIUM 8.0* 8.0* 8.5* 8.5* 7.9*  MG 1.5* 2.0  --   --  1.6*   Liver Function Tests: No results for  input(s): AST, ALT, ALKPHOS, BILITOT, PROT, ALBUMIN in the last 168 hours. No results for input(s): LIPASE, AMYLASE in the last 168 hours. No results for input(s): AMMONIA in the last 168 hours. CBC: Recent Labs  Lab 09/24/21 0519 09/26/21 0513 09/27/21 0539  WBC 5.5 6.8 7.1  NEUTROABS 3.5  --   --   HGB 11.5* 12.7 11.9*  HCT 34.2* 37.7 35.5*  MCV 91.7 91.3 91.5  PLT 220 227 229   Cardiac Enzymes: No results for input(s): CKTOTAL, CKMB, CKMBINDEX, TROPONINI in the last 168 hours. BNP: Invalid input(s): POCBNP CBG: No results for input(s): GLUCAP in the last 168 hours. D-Dimer No results for input(s): DDIMER in the last 72 hours. Hgb A1c No results for input(s):  HGBA1C in the last 72 hours. Lipid Profile No results for input(s): CHOL, HDL, LDLCALC, TRIG, CHOLHDL, LDLDIRECT in the last 72 hours. Thyroid function studies No results for input(s): TSH, T4TOTAL, T3FREE, THYROIDAB in the last 72 hours.  Invalid input(s): FREET3 Anemia work up No results for input(s): VITAMINB12, FOLATE, FERRITIN, TIBC, IRON, RETICCTPCT in the last 72 hours. Urinalysis    Component Value Date/Time   COLORURINE ORANGE (A) 09/18/2021 1237   APPEARANCEUR CLEAR 09/18/2021 1237   LABSPEC 1.005 09/18/2021 1237   PHURINE 6.0 09/18/2021 1237   GLUCOSEU NEGATIVE 09/18/2021 1237   HGBUR NEGATIVE 09/18/2021 1237   [place] 09/18/2021 [place] 09/18/2021 1237   PROTEINUR NEGATIVE 09/18/2021 1237   NITRITE NEGATIVE 09/18/2021 [place] 09/18/2021 1237   Sepsis Labs Invalid input(s): PROCALCITONIN,  WBC,  LACTICIDVEN Microbiology No results found for this or any previous visit (from the past 240 hour(s)).   Time coordinating discharge: 42mns  SIGNED:   JKathie Dike MD  [facility] 09/28/2021, 7:37 PM   If 7PM-7AM, please contact night-coverage [email or web address]

## 2021-09-28 NOTE — Progress Notes (Signed)
   Subjective/Chief Complaint: No pain this morning Passing flatus No further BM Tolerated clears   Objective: Vital signs in last 24 hours: Temp:  [97.7 F (36.5 C)-98.1 F (36.7 C)] 97.7 F (36.5 C) (10/16 0407) Pulse Rate:  [65-86] 65 (10/16 0407) Resp:  [14-16] 14 (10/16 0407) BP: (135-141)/(66-82) 135/66 (10/16 0407) SpO2:  [97 %-99 %] 97 % (10/16 0407) Last BM Date: 09/27/21  Intake/Output from previous day: 10/15 0701 - 10/16 0700 In: 1080 [P.O.:1080] Out: -  Intake/Output this shift: No intake/output data recorded.  Exam: Awake and alert Looks comfortable Abdomen soft, non-tender this morning  Lab Results:  Recent Labs    09/26/21 0513 09/27/21 0539  WBC 6.8 7.1  HGB 12.7 11.9*  HCT 37.7 35.5*  PLT 227 229   BMET Recent Labs    09/26/21 0513 09/27/21 0539  NA 134* 137  K 3.7 3.2*  CL 100 104  CO2 25 25  GLUCOSE 108* 87  BUN <5* 7*  CREATININE 0.55 0.54  CALCIUM 8.5* 7.9*   PT/INR No results for input(s): LABPROT, INR in the last 72 hours. ABG No results for input(s): PHART, HCO3 in the last 72 hours.  Invalid input(s): PCO2, PO2  Studies/Results: DG Abd Portable 1V  Result Date: 09/27/2021 CLINICAL DATA:  Follow-up of small-bowel obstruction EXAM: PORTABLE ABDOMEN - 1 VIEW COMPARISON:  One day prior FINDINGS: Two supine views. Contrast has passed into the colon, which is normal in caliber. Gas and contrast filled small bowel loops including at up to 3.8 cm, improved. Cholecystectomy. No gross free intraperitoneal air. IMPRESSION: Improved to resolved small-bowel obstruction. Electronically Signed   By: Abigail Miyamoto M.D.   On: 09/27/2021 08:21    Anti-infectives: Anti-infectives (From admission, onward)    Start     Dose/Rate Route Frequency Ordered Stop   09/19/21 1330  acyclovir (ZOVIRAX) tablet 400 mg        400 mg Oral 2 times daily 09/19/21 1241     09/19/21 1330  Amikacin Sulfate Liposome SUSP 590 mg        590 mg Inhalation  Daily 09/19/21 1241     09/19/21 1330  ethambutol (MYAMBUTOL) tablet 1,200 mg       Note to Pharmacy: TAKE 3 TABLETS EVERY DAY     1,200 mg Oral Daily 09/19/21 1241     09/19/21 1330  rifampin (RIFADIN) capsule 600 mg        600 mg Oral Daily 09/19/21 1241         Assessment/Plan: Partial SBO  Appears to be resolving Will advance diet   LOS: 10 days    Coralie Keens MD 09/28/2021

## 2021-09-28 NOTE — Progress Notes (Signed)
Patient discharged to home with family, discharge instructions reviewed with patient who verbalized understanding. 

## 2021-09-28 NOTE — Progress Notes (Signed)
Marinol 2.5 mg capsule was not return prior to patients discharge. When attempted to return in pyxis, was unable and had to waste with [employer].

## 2021-10-03 DIAGNOSIS — K56609 Unspecified intestinal obstruction, unspecified as to partial versus complete obstruction: Secondary | ICD-10-CM | POA: Diagnosis not present

## 2021-10-03 DIAGNOSIS — J479 Bronchiectasis, uncomplicated: Secondary | ICD-10-CM | POA: Diagnosis not present

## 2021-10-03 DIAGNOSIS — A310 Pulmonary mycobacterial infection: Secondary | ICD-10-CM | POA: Diagnosis not present

## 2021-10-14 DIAGNOSIS — M5136 Other intervertebral disc degeneration, lumbar region: Secondary | ICD-10-CM | POA: Diagnosis not present

## 2021-10-14 DIAGNOSIS — M5416 Radiculopathy, lumbar region: Secondary | ICD-10-CM | POA: Diagnosis not present

## 2021-10-17 ENCOUNTER — Telehealth: Payer: Self-pay

## 2021-10-17 NOTE — Telephone Encounter (Signed)
She talked to [facility] yesterday I thought?   Anyway... there are 2 bottles on my desk for her when she comes in (we aren't putting medication up front anymore). Thanks!

## 2021-10-17 NOTE — Telephone Encounter (Signed)
Patient called and left voicemail stating she never heard back from our office about picking up Clofazamine bottles next week. Stated she called earlier this week but never heard back. No phone note seen from this week. Will forward to pharmacy team.   Carlean Purl, RN

## 2021-10-17 NOTE — Telephone Encounter (Signed)
Patient notified that her medication will be ready for her next week. She'll be coming by on Thursday 11/10 to pick up.  Thomasina Housley Lorita Officer, RN

## 2021-10-23 ENCOUNTER — Ambulatory Visit: Payer: Medicare HMO | Admitting: Pulmonary Disease

## 2021-10-23 ENCOUNTER — Other Ambulatory Visit: Payer: Self-pay

## 2021-10-23 ENCOUNTER — Encounter: Payer: Self-pay | Admitting: Pulmonary Disease

## 2021-10-23 VITALS — BP 116/66 | HR 77 | Ht 63.0 in | Wt 102.0 lb

## 2021-10-23 DIAGNOSIS — R634 Abnormal weight loss: Secondary | ICD-10-CM | POA: Diagnosis not present

## 2021-10-23 DIAGNOSIS — J479 Bronchiectasis, uncomplicated: Secondary | ICD-10-CM

## 2021-10-23 NOTE — Patient Instructions (Signed)
Nice to see you again  I am glad your stomach is doing better  Discussed with Dr. Megan Salon your concerns with Arykace, worsening cough or at least cough was improved in the hospital while not receiving this medication.  Recommend drinking a boost with every meal or after every meal to help increase calorie and protein intake to help with the weight loss  I will send a referral to a nutritionist, hopefully we can find one in [place] closer to home.  That way can work on strategies to help gain weight in a healthy way.  Return to clinic in 3 months or sooner as needed

## 2021-10-23 NOTE — Progress Notes (Signed)
Synopsis: Referred in 2010 for bronchiectasis by Kiara Dress, MD.  Previously patient of Dr. Normajean Medina and Dr. Lake Medina and Dr. Carlis Medina.  Subjective:   PATIENT ID: Kiara Medina GENDER: female DOB: 1946/12/14, MRN: [id number]  Chief Complaint  Patient presents with   Follow-up    3 mo f/u. States she has been doing well since last visit. Wants to discuss [place].    Kiara Medina is a 75 y.o. woman with a history of MDR MAC and bronchiectasis who presents for follow up. She has been on 4 drug suppressive therapy for 3.5 years with persistently positive cultures (clofazamine, ethambutol, rifampin, and inhaled amikacin).  Continues with weight loss.   Hospital last month for SBO.  H&P and discharge summary reviewed.  Surgery consult note reviewed.  Treated conservatively.  Fortunately exacerbated or worsened weight loss.  She is very concerned about this.  Reports eating meals regularly.  Try to supplement with boost but has fallen off after SBO.  Discussed gradual increase protein calorie dense supplementation.  Discussed meeting with nutritionist locally to help develop plan for increasing calories in a healthy manner.  She states her cough is nearly gone in the hospital.  She was off inhaled amikacin.  She wonders if inhaled amikacin is making cough worse.  Now back at home and on this medication cough is returned.  Noted that worsened pretty SBO.  Not much phlegm production.  Discussed that the lack of cough while admitted may have been protective to help prevent belly pain while she was experiencing SBO in the hospital.  Past Medical History:  Diagnosis Date   Allergic rhinitis, cause unspecified    Bronchiectasis without acute exacerbation (HCC)    Cough    Sinusitis      Family History  Problem Relation Age of Onset   Emphysema Sister    Stroke Mother    Clotting disorder Mother      Past Surgical History:  Procedure Laterality Date   APPENDECTOMY     CATARACT EXTRACTION   08/2013   CHOLECYSTECTOMY     TUBAL LIGATION     VESICOVAGINAL FISTULA CLOSURE W/ TAH      Social History   Socioeconomic History   Marital status: Widowed    Spouse name: Not on file   Number of children: Not on file   Years of education: Not on file   Highest education level: Not on file  Occupational History   Occupation: realtor  Tobacco Use   Smoking status: Former    Packs/day: 1.00    Years: 20.00    Pack years: 20.00    Types: Cigarettes    Quit date: 12/14/1985    Years since quitting: 35.8   Smokeless tobacco: Never  Vaping Use   Vaping Use: Never used  Substance and Sexual Activity   Alcohol use: Not Currently    Alcohol/week: 1.0 standard drink    Types: 1 Glasses of wine per week    Comment: occ   Drug use: No   Sexual activity: Not on file  Other Topics Concern   Not on file  Social History Narrative   Not on file   Social Determinants of Health   Financial Resource Strain: Not on file  Food Insecurity: Not on file  Transportation Needs: Not on file  Physical Activity: Not on file  Stress: Not on file  Social Connections: Not on file  Intimate Partner Violence: Not on file     No Known Allergies  Immunization History  Administered Date(s) Administered   Fluad Quad(high Dose 65+) 09/24/2020   Influenza Split 09/13/2012, 09/06/2014   Influenza Whole 09/09/2009, 08/14/2010, 09/14/2011   Influenza, High Dose Seasonal PF 10/10/2015   Influenza,inj,Quad PF,6+ Mos 09/13/2013, 09/21/2016, 08/26/2017, 08/22/2018   Influenza-Unspecified 08/29/2019   Moderna Sars-Covid-2 Vaccination 01/03/2020, 01/31/2020, 08/02/2020, 05/06/2021   Pneumococcal Conjugate-13 11/24/2014   Pneumococcal Polysaccharide-23 07/17/2009    Outpatient Medications Prior to Visit  Medication Sig Dispense Refill   acetaminophen (TYLENOL) 500 MG tablet Take 1,000 mg by mouth every 6 (six) hours as needed for moderate pain.     acyclovir (ZOVIRAX) 400 MG tablet Take 400 mg by  mouth 2 (two) times daily.     albuterol (VENTOLIN HFA) 108 (90 Base) MCG/ACT inhaler Inhale 2 puffs into the lungs every 6 (six) hours as needed for wheezing or shortness of breath.     Amikacin Sulfate Liposome (ARIKAYCE) 590 MG/8.4ML SUSP Inhale the contents of one vial (590 mg) via Lamira device 1 time daily as directed. (Patient taking differently: Inhale 590 mg into the lungs daily. Inhale the contents of one vial (590 mg) via Lamira device 1 time daily as directed.) 235.2 mL 28   ascorbic acid (VITAMIN C) 500 MG tablet Take 500 mg by mouth daily.     atorvastatin (LIPITOR) 20 MG tablet Take 20 mg by mouth daily.     B Complex Vitamins (B COMPLEX-B12 PO) Take 1 tablet by mouth daily.     benzonatate (TESSALON PERLES) 100 MG capsule Take 1 capsule (100 mg total) by mouth 3 (three) times daily as needed for cough. 90 capsule 5   calcium carbonate (OSCAL) 1500 (600 Ca) MG TABS tablet Take 600 mg of elemental calcium by mouth 2 (two) times daily with a meal.     cetirizine (ZYRTEC) 10 MG tablet Take 10 mg by mouth daily.     Cholecalciferol (VITAMIN D3) 125 MCG (5000 UT) TABS Take 1 tablet by mouth daily.     CLOFAZIMINE PO Take 100 mg by mouth daily.     diazepam (VALIUM) 10 MG tablet Take 10 mg by mouth every 6 (six) hours as needed for anxiety (For MRI).     docusate sodium (COLACE) 100 MG capsule Take 1 capsule (100 mg total) by mouth 2 (two) times daily. 10 capsule 0   dronabinol (MARINOL) 2.5 MG capsule Take 1 capsule (2.5 mg total) by mouth 2 (two) times daily before a meal. 60 capsule 5   ethambutol (MYAMBUTOL) 400 MG tablet TAKE 3 TABLETS EVERY DAY (Patient taking differently: Take 1,200 mg by mouth daily.) 270 tablet 3   Flaxseed, Linseed, (FLAX SEED OIL) 1300 MG CAPS Take 1 tablet by mouth daily.     Fluticasone-Umeclidin-Vilant (TRELEGY ELLIPTA) 100-62.5-25 MCG/INH AEPB Inhale 1 puff into the lungs daily. 60 each 5   Melatonin 10 MG CAPS Take 10 mg by mouth at bedtime.     omeprazole  (PRILOSEC) 40 MG capsule Take 1 capsule (40 mg total) by mouth daily.     polyethylene glycol (MIRALAX / GLYCOLAX) 17 g packet Take 17 g by mouth daily as needed for mild constipation. 14 each 0   Probiotic Product (PROBIOTIC DAILY PO) Take 1 capsule by mouth daily.     Propylene Glycol (SYSTANE BALANCE OP) Place 1 drop into the left eye daily as needed (dry eyes).     Respiratory Therapy Supplies (FLUTTER) DEVI Use as directed. 1 each 0   rifampin (RIFADIN) 300 MG capsule Take  2 capsules (600 mg total) by mouth daily. 180 capsule 3   sodium chloride HYPERTONIC 3 % nebulizer solution INHALE THE CONTENTS OF 1 VIAL ( 4ML ) VIA NEBULIZER TWICE DAILY (Patient taking differently: Take 4 mLs by nebulization daily.) 720 mL 3   Spacer/Aero-Holding Chambers (AEROCHAMBER MV) inhaler Use as instructed 1 each 2   TURMERIC PO Take 2,000 mg by mouth daily.      zinc gluconate 50 MG tablet Take 50 mg by mouth daily.     No facility-administered medications prior to visit.    Review of Systems  No chest pain.  No hemoptysis.  No worsening sputum production or change in quality of sputum.   Objective:   Vitals:   10/23/21 1524  BP: 116/66  Pulse: 77  SpO2: 100%  Weight: 102 lb (46.3 kg)  Height: _0  (1.6 m)   100% on   RA BMI Readings from Last 3 Encounters:  10/23/21 18.07 kg/m  09/18/21 18.60 kg/m  08/12/21 18.25 kg/m   Wt Readings from Last 3 Encounters:  10/23/21 102 lb (46.3 kg)  09/18/21 105 lb (47.6 kg)  08/12/21 103 lb (46.7 kg)    Physical Exam BP 116/66   Pulse 77   Ht _1  (1.6 m)   Wt 102 lb (46.3 kg)   SpO2 100% Comment: on RA  BMI 18.07 kg/m  General: Well-appearing in no acute distress, thin Eyes: EOMI, icterus Respiratory: Completely clear, normal work of breathing Cardiovascular: Regular rhythm, no murmur Extremities: Warm, no edema     CBC    Component Value Date/Time   WBC 7.1 09/27/2021 0539   RBC 3.88 09/27/2021 0539   HGB 11.9 (L) 09/27/2021  0539   HCT 35.5 (L) 09/27/2021 0539   PLT 229 09/27/2021 0539   MCV 91.5 09/27/2021 0539   MCH 30.7 09/27/2021 0539   MCHC 33.5 09/27/2021 0539   RDW 15.0 09/27/2021 0539   LYMPHSABS 1.3 09/24/2021 0519   MONOABS 0.5 09/24/2021 0519   EOSABS 0.2 09/24/2021 0519   BASOSABS 0.0 09/24/2021 0519    CHEMISTRY No results for input(s): NA, K, CL, CO2, GLUCOSE, BUN, CREATININE, CALCIUM, MG, PHOS in the last 168 hours. CrCl cannot be calculated (Patient's most recent lab result is older than the maximum 21 days allowed.).  12/28/2019: IgG 954 IgM 96 IgA 289 IgE 10 IgG subclasses normal (other than increased IgG subclass 3)  Cultures: 3/10322 - pending 11/23/2019-MAI 05/17/2019-MAI 03/15/2019-MAI 01/10/2019 MAI 11/07/2018-MAI 10/10/2018-MAI  08/22/2018-MAI 07/11/2018-MAI 05/31/2018-MAI MAI susceptibilities-resistant clarithromycin, linezolid.  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  Susceptible to amikacin, cefepime, clarithromycin, imipenem, linezolid, trimethoprim sulfamethoxazole. 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  Tree-in-bud opacities diffusely.  Mucus impaction in airways.   Pulmonary Functions Testing Results: PFT Results Latest Ref Rng & Units 04/18/2018  FVC-Pre L 2.08  FVC-Predicted Pre % 79  Pre FEV1/FVC % % 75  FEV1-Pre L 1.57  FEV1-Predicted Pre % 80   2019-no significant obstruction, mildly reduced FVC.     Assessment & Plan:     ICD-10-CM   1. BRONCHIECTASIS  J47.9 Amb ref to Medical Nutrition Therapy-MNT    2. Loss of weight  R63.4 Amb ref  to Medical Nutrition Therapy-MNT  Dyspnea on exertion likely due to chronic bronchiectasis-improved on LAMA/LABA; worsed when back off these meds. Component of deconditioning.  Has seasonal allergies, describes bronchospasm, possible asthma. 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  No obvious immunodeficiencies.  Progressive on CT over the last 6 years.  No evidence of hypersensitivity pneumonitis from inhaled amikacin on CT scan.  With ongoing worsening cough and poor appetite.  Concerning for exacerbation. - Increase hypertonic saline to twice a day during exacerbation and continued twice daily usage (daily currently) -Continue current 4 drug MAI therapy and ID follow-up.  I agree with Dr. Megan Salon that she has limited treatment options and would likely progress faster off meds. To discuss cough seemed improved in hospital while off inhaled amikacin -Up-to-date on Covid, pneumonia.   Weight loss- concerned this may be a MAC secondary effect, complicated by fatigue and DOE.  Related to metabolic needs from dyspnea, cough.  Poor appetite.  This is ongoing. Worsened with recent SBO. -Referral to nutritionist in [place] placed   RTC in 3 months with Dr. Silas Flood.     Current Outpatient Medications:    acetaminophen (TYLENOL) 500 MG tablet, Take 1,000 mg by mouth every 6 (six) hours as needed for moderate pain., Disp: , Rfl:    acyclovir (ZOVIRAX) 400 MG tablet, Take 400 mg by mouth 2 (two) times daily., Disp: , Rfl:    albuterol (VENTOLIN HFA) 108 (90 Base) MCG/ACT inhaler, Inhale 2 puffs into the lungs every 6 (six) hours as needed for wheezing or shortness of breath., Disp: , Rfl:    Amikacin Sulfate Liposome  (ARIKAYCE) 590 MG/8.4ML SUSP, Inhale the contents of one vial (590 mg) via Lamira device 1 time daily as directed. (Patient taking differently: Inhale 590 mg into the lungs daily. Inhale the contents of one vial (590 mg) via Lamira device 1 time daily as directed.), Disp: 235.2 mL, Rfl: 28   ascorbic acid (VITAMIN C) 500 MG tablet, Take 500 mg by mouth daily., Disp: , Rfl:    atorvastatin (LIPITOR) 20 MG tablet, Take 20 mg by mouth daily., Disp: , Rfl:    B Complex Vitamins (B COMPLEX-B12 PO), Take 1 tablet by mouth daily., Disp: , Rfl:    benzonatate (TESSALON PERLES) 100 MG capsule, Take 1 capsule (100 mg total) by mouth 3 (three) times daily as needed for cough., Disp: 90 capsule, Rfl: 5   calcium carbonate (OSCAL) 1500 (600 Ca) MG TABS tablet, Take 600 mg of elemental calcium by mouth 2 (two) times daily with a meal., Disp: , Rfl:    cetirizine (ZYRTEC) 10 MG tablet, Take 10 mg by mouth daily., Disp: , Rfl:    Cholecalciferol (VITAMIN D3) 125 MCG (5000 UT) TABS, Take 1 tablet by mouth daily., Disp: , Rfl:    CLOFAZIMINE PO, Take 100 mg by mouth daily., Disp: , Rfl:    diazepam (VALIUM) 10 MG tablet, Take 10 mg by mouth every 6 (six) hours as needed for anxiety (For MRI)., Disp: , Rfl:    docusate sodium (COLACE) 100 MG capsule, Take 1 capsule (100 mg total) by mouth 2 (two) times daily., Disp: 10 capsule, Rfl: 0   dronabinol (MARINOL) 2.5 MG capsule, Take 1 capsule (2.5 mg total) by mouth 2 (two) times daily before a meal., Disp: 60 capsule, Rfl:  5   ethambutol (MYAMBUTOL) 400 MG tablet, TAKE 3 TABLETS EVERY DAY (Patient taking differently: Take 1,200 mg by mouth daily.), Disp: 270 tablet, Rfl: 3   Flaxseed, Linseed, (FLAX SEED OIL) 1300 MG CAPS, Take 1 tablet by mouth daily., Disp: , Rfl:    Fluticasone-Umeclidin-Vilant (TRELEGY ELLIPTA) 100-62.5-25 MCG/INH AEPB, Inhale 1 puff into the lungs daily., Disp: 60 each, Rfl: 5   Melatonin 10 MG CAPS, Take 10 mg by mouth at bedtime., Disp: , Rfl:     omeprazole (PRILOSEC) 40 MG capsule, Take 1 capsule (40 mg total) by mouth daily., Disp: , Rfl:    polyethylene glycol (MIRALAX / GLYCOLAX) 17 g packet, Take 17 g by mouth daily as needed for mild constipation., Disp: 14 each, Rfl: 0   Probiotic Product (PROBIOTIC DAILY PO), Take 1 capsule by mouth daily., Disp: , Rfl:    Propylene Glycol (SYSTANE BALANCE OP), Place 1 drop into the left eye daily as needed (dry eyes)., Disp: , Rfl:    Respiratory Therapy Supplies (FLUTTER) DEVI, Use as directed., Disp: 1 each, Rfl: 0   rifampin (RIFADIN) 300 MG capsule, Take 2 capsules (600 mg total) by mouth daily., Disp: 180 capsule, Rfl: 3   sodium chloride HYPERTONIC 3 % nebulizer solution, INHALE THE CONTENTS OF 1 VIAL ( 4ML ) VIA NEBULIZER TWICE DAILY (Patient taking differently: Take 4 mLs by nebulization daily.), Disp: 720 mL, Rfl: 3   Spacer/Aero-Holding Chambers (AEROCHAMBER MV) inhaler, Use as instructed, Disp: 1 each, Rfl: 2   TURMERIC PO, Take 2,000 mg by mouth daily. , Disp: , Rfl:    zinc gluconate 50 MG tablet, Take 50 mg by mouth daily., Disp: , Rfl:

## 2021-10-27 ENCOUNTER — Telehealth: Payer: Self-pay | Admitting: Pharmacist

## 2021-10-27 NOTE — Telephone Encounter (Signed)
Patient picked up 2 bottles of clofazimine (~3 month's supply) on 11/9.

## 2021-10-29 DIAGNOSIS — R634 Abnormal weight loss: Secondary | ICD-10-CM | POA: Diagnosis not present

## 2021-11-04 DIAGNOSIS — M79604 Pain in right leg: Secondary | ICD-10-CM | POA: Diagnosis not present

## 2021-11-04 DIAGNOSIS — Z79891 Long term (current) use of opiate analgesic: Secondary | ICD-10-CM | POA: Diagnosis not present

## 2021-11-11 ENCOUNTER — Encounter: Payer: Self-pay | Admitting: Internal Medicine

## 2021-11-11 ENCOUNTER — Ambulatory Visit: Payer: Medicare HMO | Admitting: Internal Medicine

## 2021-11-11 ENCOUNTER — Other Ambulatory Visit: Payer: Self-pay

## 2021-11-11 DIAGNOSIS — A310 Pulmonary mycobacterial infection: Secondary | ICD-10-CM | POA: Diagnosis not present

## 2021-11-11 NOTE — Assessment & Plan Note (Signed)
I talked to her again about the difficulty of knowing if she is getting any significant benefit from her current 4 drug antibiotic regimen.  It is interesting that she noted improvement in her cough when she was off of amikacin.  She will stop taking amikacin now and I will follow-up with her in about 3 weeks to see what changes she has noted.  She may actually have some improvement in her quality of life off of the amikacin.

## 2021-11-11 NOTE — Progress Notes (Signed)
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   ACYCLOVIR (ZOVIRAX) 400 MG TABLET    Take 400 mg by mouth 2 (two) times daily.   ALBUTEROL (VENTOLIN HFA) 108 (90 BASE) MCG/ACT INHALER    Inhale 2 puffs into the lungs every 6 (six) hours as needed for wheezing or shortness of breath.   AMIKACIN SULFATE LIPOSOME (ARIKAYCE) 590 MG/8.4ML SUSP    Inhale the contents of one vial (590 mg) via Lamira device 1 time daily as directed.   ASCORBIC ACID (VITAMIN C) 500 MG TABLET    Take 500 mg by mouth daily.   ATORVASTATIN (LIPITOR) 20 MG TABLET    Take 20 mg by mouth daily.   B COMPLEX VITAMINS (B COMPLEX-B12 PO)    Take 1 tablet by mouth daily.   BENZONATATE (TESSALON PERLES) 100 MG CAPSULE    Take 1 capsule (100 mg total) by mouth 3 (three) times daily as needed for cough.   CALCIUM CARBONATE (OSCAL) 1500 (600 CA) MG TABS TABLET    Take 600 mg of elemental calcium by mouth 2 (two) times daily with a meal.   CETIRIZINE (ZYRTEC) 10 MG TABLET    Take 10 mg by mouth daily.   CHOLECALCIFEROL (VITAMIN D3) 125 MCG (5000 UT) TABS    Take 1 tablet by mouth daily.    CLOFAZIMINE PO    Take 100 mg by mouth daily.   DIAZEPAM (VALIUM) 10 MG TABLET    Take 10 mg by mouth every 6 (six) hours as needed for anxiety (For MRI).   DOCUSATE SODIUM (COLACE) 100 MG CAPSULE    Take 1 capsule (100 mg total) by mouth 2 (two) times daily.   DRONABINOL (MARINOL) 2.5 MG CAPSULE    Take 1 capsule (2.5 mg total) by mouth 2 (two) times daily before a meal.   ETHAMBUTOL (MYAMBUTOL) 400 MG TABLET    TAKE 3 TABLETS EVERY DAY   FLAXSEED, LINSEED, (FLAX SEED OIL) 1300 MG CAPS    Take 1 tablet by mouth daily.   FLUTICASONE-UMECLIDIN-VILANT (TRELEGY ELLIPTA) 100-62.5-25 MCG/INH AEPB    Inhale 1 puff into the lungs daily.   MELATONIN 10 MG CAPS    Take 10 mg by mouth at bedtime.   OMEPRAZOLE (PRILOSEC) 40 MG CAPSULE    Take 1 capsule (40 mg total) by mouth daily.   POLYETHYLENE GLYCOL (MIRALAX / GLYCOLAX) 17 G PACKET    Take 17 g by mouth daily as needed for mild constipation.   PROBIOTIC PRODUCT (PROBIOTIC DAILY PO)    Take 1 capsule by mouth daily.   PROPYLENE GLYCOL (SYSTANE BALANCE OP)  Place 1 drop into the left eye daily as needed (dry eyes).   RESPIRATORY THERAPY SUPPLIES (FLUTTER) DEVI    Use as directed.   RIFAMPIN (RIFADIN) 300 MG CAPSULE    Take 2 capsules (600 mg total) by mouth daily.   SODIUM CHLORIDE HYPERTONIC 3 % NEBULIZER SOLUTION    INHALE THE CONTENTS OF 1 VIAL ( 4ML ) VIA NEBULIZER TWICE DAILY   SPACER/AERO-HOLDING CHAMBERS (AEROCHAMBER MV) INHALER    Use as instructed   TURMERIC PO    Take 2,000 mg by mouth daily.    ZINC GLUCONATE 50 MG TABLET    Take 50 mg by mouth daily.  Modified Medications   No medications on file  Discontinued Medications   No medications on file    Subjective: Kiara Medina is in for her routine follow-up appointment.  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  She was not treated at that time.  She tested positive again in March 2015.  She started on azithromycin, ethambutol and rifampin in  December 2015 and completed the course of therapy in November 2016.  She relapsed in June 2018 and started on azithromycin, ethambutol and rifampin again.  She saw Dr. Brigitte P[facility] at [facility] in November 2018.  Aerosolized amikacin was added in January 2019  because of evolving resistance.  In September 2019 azithromycin was changed to clofazimine and she continued ethambutol, rifampin and aerosolized amikacin.  She was hospitalized last month with partial small bowel obstruction.  She was hospitalized for 10 days and did not receive any of her aerosolized amikacin while hospitalized.  She noted that her chronic cough improved significantly while she was off of it.  Her cough returned when she restarted it after discharge.  She has been very weak since her hospitalization but is feeling a little bit better.  She has been able to gain some weight.  Review of Systems: Review of Systems  Constitutional:  Positive for malaise/fatigue. Negative for chills, diaphoresis, fever and weight loss.  Respiratory:  Positive for cough, sputum production and shortness of breath. Negative for hemoptysis.   Cardiovascular:  Negative for chest pain.  Gastrointestinal:  Negative for abdominal pain, diarrhea, heartburn, nausea and vomiting.  Skin:  Negative for rash.   Past Medical History:  Diagnosis Date   Allergic rhinitis, cause unspecified    Bronchiectasis without acute exacerbation (HCC)    Cough    Sinusitis     Social History   Tobacco Use   Smoking status: Former    Packs/day: 1.00    Years: 20.00    Pack years: 20.00    Types: Cigarettes    Quit date: 12/14/1985    Years since quitting: 35.9   Smokeless tobacco: Never  Vaping Use   Vaping Use: Never used  Substance Use Topics   Alcohol use: Not Currently    Alcohol/week: 1.0 standard drink    Types: 1 Glasses of wine per week    Comment: occ   Drug use: No    Family History  Problem Relation Age of Onset   Emphysema Sister    Stroke  Mother    Clotting disorder Mother     No Known Allergies  Objective: Vitals:   11/11/21 1429  BP: 115/68  Pulse: 88  Resp: 16  Temp: (!) 97.3 F (36.3 C)  TempSrc: Temporal  SpO2: 97%  Weight: 105 lb (47.6 kg)   Body mass index is 18.6 kg/m.  Physical Exam Constitutional:  Comments: She is in good spirits.  She has gained 3 pounds since her recent hospital discharge.  Cardiovascular:     Rate and Rhythm: Normal rate and regular rhythm.     Heart sounds: No murmur heard. Pulmonary:     Effort: Pulmonary effort is normal.     Breath sounds: Rhonchi present. No wheezing or rales.  Abdominal:     Palpations: Abdomen is soft.     Tenderness: There is no abdominal tenderness.  Psychiatric:        Mood and Affect: Mood normal.        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  It is interesting that she noted improvement in her cough when she was off of amikacin.  She will stop taking amikacin now and I will follow-up with her in about 3 weeks to see what changes she has noted.  She may actually have some improvement in her quality of life off of the amikacin.       Michel Bickers, MD [facility] [phone] pager   [phone] cell 11/11/2021, 2:53 PM

## 2021-11-19 DIAGNOSIS — K219 Gastro-esophageal reflux disease without esophagitis: Secondary | ICD-10-CM | POA: Diagnosis not present

## 2021-11-19 DIAGNOSIS — J471 Bronchiectasis with (acute) exacerbation: Secondary | ICD-10-CM | POA: Diagnosis not present

## 2021-11-19 DIAGNOSIS — R7301 Impaired fasting glucose: Secondary | ICD-10-CM | POA: Diagnosis not present

## 2021-11-19 DIAGNOSIS — E785 Hyperlipidemia, unspecified: Secondary | ICD-10-CM | POA: Diagnosis not present

## 2021-11-19 DIAGNOSIS — E559 Vitamin D deficiency, unspecified: Secondary | ICD-10-CM | POA: Diagnosis not present

## 2021-11-19 DIAGNOSIS — A310 Pulmonary mycobacterial infection: Secondary | ICD-10-CM | POA: Diagnosis not present

## 2021-11-20 DIAGNOSIS — M79604 Pain in right leg: Secondary | ICD-10-CM | POA: Diagnosis not present

## 2021-12-02 ENCOUNTER — Encounter: Payer: Self-pay | Admitting: Internal Medicine

## 2021-12-02 ENCOUNTER — Other Ambulatory Visit: Payer: Self-pay

## 2021-12-02 ENCOUNTER — Ambulatory Visit (INDEPENDENT_AMBULATORY_CARE_PROVIDER_SITE_OTHER): Payer: Medicare HMO | Admitting: Internal Medicine

## 2021-12-02 DIAGNOSIS — A310 Pulmonary mycobacterial infection: Secondary | ICD-10-CM | POA: Diagnosis not present

## 2021-12-02 NOTE — Progress Notes (Signed)
Virtual Visit via Telephone Note  I connected with Kiara Medina on 12/02/21 at  3:30 PM EST by telephone and verified that I am speaking with the correct person using two identifiers.  Location: Patient: Home Provider: [facility]    I discussed the limitations, risks, security and privacy concerns of performing an evaluation and management service by telephone and the availability of in person appointments. I also discussed with the patient that there may be a patient responsible charge related to this service. The patient expressed understanding and agreed to proceed.   History of Present Illness: I called and spoke with Bethena Roys today.  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  She is tolerating her antibiotics well.  She stopped aerosolized amikacin on 11/11/2021.  She has noted significant improvement in her chronic cough.  There has been no change in her chronic dyspnea on exertion.   Observations/Objective:   Assessment and Plan: Her quality of life has improved off of aerosolized amikacin so she will remain off of it for now.  She will continue her oral antibiotics and follow-up here in 6 weeks.  Follow Up Instructions: Continue clofazimine, ethambutol and rifampin Consider follow-up chest CT scan early next year   I discussed the assessment and treatment plan with the patient. The patient was provided an opportunity to ask questions and all were answered. The patient agreed with the plan and demonstrated an understanding of the instructions.   The patient was advised to call back or seek an in-person evaluation if the symptoms worsen or if the condition fails to improve as anticipated.  I provided 15 minutes of non-face-to-face time during this encounter.   Michel Bickers, MD

## 2021-12-03 DIAGNOSIS — M79604 Pain in right leg: Secondary | ICD-10-CM | POA: Diagnosis not present

## 2022-01-13 ENCOUNTER — Other Ambulatory Visit (HOSPITAL_COMMUNITY): Payer: Self-pay

## 2022-01-13 ENCOUNTER — Other Ambulatory Visit: Payer: Self-pay

## 2022-01-13 ENCOUNTER — Ambulatory Visit (INDEPENDENT_AMBULATORY_CARE_PROVIDER_SITE_OTHER): Payer: Medicare Other | Admitting: Internal Medicine

## 2022-01-13 DIAGNOSIS — R1310 Dysphagia, unspecified: Secondary | ICD-10-CM | POA: Diagnosis not present

## 2022-01-13 DIAGNOSIS — A310 Pulmonary mycobacterial infection: Secondary | ICD-10-CM | POA: Diagnosis not present

## 2022-01-13 DIAGNOSIS — R634 Abnormal weight loss: Secondary | ICD-10-CM | POA: Diagnosis not present

## 2022-01-13 MED ORDER — MEGESTROL ACETATE 625 MG/5ML PO SUSP: 625.0000 mg | Freq: Every day | ORAL | 5 refills | Status: DC

## 2022-01-13 NOTE — Progress Notes (Signed)
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   ACYCLOVIR (ZOVIRAX) 400 MG TABLET    Take 400 mg by mouth 2 (two) times daily.   ALBUTEROL (VENTOLIN HFA) 108 (90 BASE) MCG/ACT INHALER    Inhale 2 puffs into the lungs every 6 (six) hours as needed for wheezing or shortness of breath.   ASCORBIC ACID (VITAMIN C) 500 MG TABLET    Take 500 mg by mouth daily.   ATORVASTATIN (LIPITOR) 20 MG TABLET    Take 20 mg by mouth daily.   B COMPLEX VITAMINS (B COMPLEX-B12 PO)    Take 1 tablet by mouth daily.   BENZONATATE (TESSALON PERLES) 100 MG CAPSULE    Take 1 capsule (100 mg total) by mouth 3 (three) times daily as needed for cough.   CALCIUM CARBONATE (OSCAL) 1500 (600 CA) MG TABS TABLET    Take 600 mg of elemental calcium by mouth 2 (two) times daily with a meal.   CETIRIZINE (ZYRTEC) 10 MG TABLET    Take 10 mg by mouth daily.   CHOLECALCIFEROL (VITAMIN D3) 125 MCG (5000 UT) TABS    Take 1 tablet by mouth daily.   CLOFAZIMINE PO    Take 100 mg by mouth daily.   DIAZEPAM (VALIUM) 10 MG TABLET    Take 10 mg by mouth  every 6 (six) hours as needed for anxiety (For MRI).   DICLOFENAC (VOLTAREN) 75 MG EC TABLET       DOCUSATE SODIUM (COLACE) 100 MG CAPSULE    Take 1 capsule (100 mg total) by mouth 2 (two) times daily.   ETHAMBUTOL (MYAMBUTOL) 400 MG TABLET    TAKE 3 TABLETS EVERY DAY   FLAXSEED, LINSEED, (FLAX SEED OIL) 1300 MG CAPS    Take 1 tablet by mouth daily.   FLUTICASONE-UMECLIDIN-VILANT (TRELEGY ELLIPTA) 100-62.5-25 MCG/INH AEPB    Inhale 1 puff into the lungs daily.   MELATONIN 10 MG CAPS    Take 10 mg by mouth at bedtime.   OMEPRAZOLE (PRILOSEC) 40 MG CAPSULE    Take 1 capsule (40 mg total) by mouth daily.   POLYETHYLENE GLYCOL (MIRALAX / GLYCOLAX) 17 G PACKET    Take 17 g by mouth daily as needed for mild constipation.   PROBIOTIC PRODUCT (PROBIOTIC DAILY PO)    Take 1 capsule by mouth daily.   PROPYLENE GLYCOL (SYSTANE BALANCE OP)    Place 1 drop into the left eye daily as needed (dry eyes).   RESPIRATORY THERAPY SUPPLIES (FLUTTER) DEVI    Use as directed.   RIFAMPIN (RIFADIN) 300 MG  CAPSULE    Take 2 capsules (600 mg total) by mouth daily.   SODIUM CHLORIDE HYPERTONIC 3 % NEBULIZER SOLUTION    INHALE THE CONTENTS OF 1 VIAL ( 4ML ) VIA NEBULIZER TWICE DAILY   SPACER/AERO-HOLDING CHAMBERS (AEROCHAMBER MV) INHALER    Use as instructed   TURMERIC PO    Take 2,000 mg by mouth daily.    ZINC GLUCONATE 50 MG TABLET    Take 50 mg by mouth daily.  Modified Medications   No medications on file  Discontinued Medications   AMIKACIN SULFATE LIPOSOME (ARIKAYCE) 590 MG/8.4ML SUSP    Inhale the contents of one vial (590 mg) via Lamira device 1 time daily as directed.   DRONABINOL (MARINOL) 2.5 MG CAPSULE    Take 1 capsule (2.5 mg total) by mouth 2 (two) times daily before a meal.    Subjective: Kiara Medina is in for her routine follow-up appointment.  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  She was not treated at that time.  She tested positive again in March 2015.   She started on azithromycin, ethambutol and rifampin in December 2015 and completed the course of therapy in November 2016.  She relapsed in June 2018 and started on azithromycin, ethambutol and rifampin again.  She saw Dr. Brigitte Pulse at [facility] in November 2018.  Aerosolized amikacin was added in January 2019  because of evolving resistance.  In September 2019 azithromycin was changed to clofazimine and she continued ethambutol, rifampin and aerosolized amikacin.  She was hospitalized last fall with partial small bowel obstruction.  She was hospitalized for 10 days and did not receive any of her aerosolized amikacin while hospitalized.  She noted that her chronic cough improved significantly while she was off of it.  Her cough returned when she restarted it after discharge.  She saw me for her hospital follow-up visit in late November at her stop it and she again noted improvement in her chronic cough.  She has still been struggling with poor appetite.  Her daughter had suggested trying marijuana gummies.  Have prescribed Marinol for her 6 weeks ago.  She tolerated Marinol well but did not have any improvement in her appetite or weight.  She thinks that her chronic morning, dry cough and dyspnea on exertion may be slightly worse over the last month.  She asked again if she should undergo a repeat chest CT scan.  She says that she has been having more difficulty swallowing her pills and also notes some occasional difficulty swallowing meat.  She is not having any acid reflux symptoms that she is aware of.  Review of Systems: Review of Systems  Constitutional:  Positive for malaise/fatigue and weight loss. Negative for chills, diaphoresis and fever.  Eyes:  Negative for blurred vision and double vision.  Respiratory:  Positive for cough, sputum production and shortness of breath. Negative for hemoptysis.   Cardiovascular:  Negative for chest pain.  Gastrointestinal:  Negative for abdominal pain, diarrhea,  heartburn, nausea and vomiting.       There has been no change in her frequent bowel movements.  Skin:  Negative for rash.   Past Medical History:  Diagnosis Date   Allergic rhinitis, cause unspecified    Bronchiectasis without acute exacerbation (HCC)    Cough    Sinusitis     Social History   Tobacco Use   Smoking status: Former    Packs/day: 1.00    Years: 20.00  Pack years: 20.00    Types: Cigarettes    Quit date: 12/14/1985    Years since quitting: 36.1   Smokeless tobacco: Never  Vaping Use   Vaping Use: Never used  Substance Use Topics   Alcohol use: Not Currently    Alcohol/week: 1.0 standard drink    Types: 1 Glasses of wine per week    Comment: occ   Drug use: No    Family History  Problem Relation Age of Onset   Emphysema Sister    Stroke Mother    Clotting disorder Mother     No Known Allergies  Objective: Vitals:   01/13/22 1345  BP: 129/76  Pulse: 95  Resp: 16  SpO2: 98%  Weight: 103 lb 3.2 oz (46.8 kg)  Height: 5\' 3"  (1.6 m)   Body mass index is 18.28 kg/m.  Physical Exam Constitutional:      Comments: She is in good spirits.  She has lost 1.8 pounds since her last visit.  Cardiovascular:     Rate and Rhythm: Regular rhythm. Tachycardia present.     Heart sounds: No murmur heard. Pulmonary:     Effort: Pulmonary effort is normal.     Breath sounds: Rales present. No wheezing or rhonchi.     Comments: She has a few crackles in her right base posteriorly. Abdominal:     Palpations: Abdomen is soft.     Tenderness: There is no abdominal tenderness.  Psychiatric:        Mood and Affect: Mood normal.        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  She is considering restarting aerosolized amikacin.  She will be following up with her pulmonologist in 10 days.   I suggested that she get his opinion about a repeat CT scan.  I told her that it is important to consider how the scan results would alter her management.      Unintentional weight loss    I will change Marinol to Megace to see if that helps with her appetite and weight.      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  She may be having silent reflux leading to esophageal stricture.  I encouraged her to discuss this with her PCP.      Michel Bickers, MD [facility] [phone] pager   [phone] cell 01/13/2022, 2:20 PM

## 2022-01-13 NOTE — Assessment & Plan Note (Signed)
I will change Marinol to Megace to see if that helps with her appetite and weight.

## 2022-01-13 NOTE — Assessment & Plan Note (Signed)
She has relapsed, drug-resistant Mycobacterium avium pneumonia with very few better options for treatment.  She is considering restarting aerosolized amikacin.  She will be following up with her pulmonologist in 10 days.  I suggested that she get his opinion about a repeat CT scan.  I told her that it is important to consider how the scan results would alter her management.

## 2022-01-13 NOTE — Assessment & Plan Note (Signed)
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  She may be having silent reflux leading to esophageal stricture.  I encouraged her to discuss this with her PCP.

## 2022-01-14 ENCOUNTER — Other Ambulatory Visit: Payer: Self-pay | Admitting: Pharmacist

## 2022-01-14 ENCOUNTER — Other Ambulatory Visit: Payer: Self-pay

## 2022-01-14 ENCOUNTER — Other Ambulatory Visit (HOSPITAL_COMMUNITY): Payer: Self-pay

## 2022-01-14 DIAGNOSIS — A310 Pulmonary mycobacterial infection: Secondary | ICD-10-CM

## 2022-01-14 DIAGNOSIS — R634 Abnormal weight loss: Secondary | ICD-10-CM

## 2022-01-14 MED ORDER — ETHAMBUTOL HCL 400 MG PO TABS
1200.0000 mg | ORAL_TABLET | Freq: Every day | ORAL | 3 refills | Status: DC
  Filled 2022-01-14: qty 270, 90d supply, fill #0

## 2022-01-14 MED ORDER — RIFAMPIN 300 MG PO CAPS
600.0000 mg | ORAL_CAPSULE | Freq: Every day | ORAL | 3 refills | Status: DC
  Filled 2022-01-14: qty 180, 90d supply, fill #0

## 2022-01-14 MED ORDER — MEGESTROL ACETATE 625 MG/5ML PO SUSP
625.0000 mg | Freq: Every day | ORAL | 5 refills | Status: DC
Start: 2022-01-14 — End: 2022-01-23
  Filled 2022-01-14: qty 150, 30d supply, fill #0

## 2022-01-14 NOTE — Progress Notes (Signed)
Sending over ethambutol and rifampin to [facility]. Messaged pharmacist and asked to please transfer the rest of her medications from [facility]. Will have her set up for shipment once that is done.   Wylma Tatem L. Darleny Sem, PharmD [facility] Clinical Pharmacist Practitioner

## 2022-01-19 ENCOUNTER — Telehealth: Payer: Self-pay | Admitting: Pharmacist

## 2022-01-19 NOTE — Telephone Encounter (Signed)
Patient picked up 2 bottles of clofazimine on 01/13/22. She will not need another refill until around the end of April.   Ia Leeb L. Augustino Savastano, PharmD [facility] Clinical Pharmacist Practitioner

## 2022-01-23 ENCOUNTER — Ambulatory Visit: Payer: Medicare Other | Admitting: Pulmonary Disease

## 2022-01-23 ENCOUNTER — Other Ambulatory Visit: Payer: Self-pay

## 2022-01-23 ENCOUNTER — Encounter: Payer: Self-pay | Admitting: Pulmonary Disease

## 2022-01-23 VITALS — BP 102/64 | HR 96 | Temp 98.0°F | Ht 63.0 in | Wt 105.0 lb

## 2022-01-23 DIAGNOSIS — J479 Bronchiectasis, uncomplicated: Secondary | ICD-10-CM

## 2022-01-23 NOTE — Progress Notes (Signed)
Synopsis: Referred in 2010 for bronchiectasis by Nicoletta Dress, MD.  Previously patient of Dr. Normajean Baxter and Dr. Lake Bells and Dr. Carlis Abbott.  Subjective:   PATIENT ID: Kiara Medina GENDER: female DOB: [birth year], MRN: [id number]  Chief Complaint  Patient presents with   Follow-up    Follow up. Pt states she want to talk to [facility] about some changes    Kiara Medina is a 76 y.o. woman with a history of MDR MAC and bronchiectasis who presents for follow up. She has been on 4 drug suppressive therapy for 3.5 years with persistently positive cultures (clofazamine, ethambutol, rifampin, and inhaled amikacin with inhaled amikacin stop fall 2022 with improvement in cough).  Continues with weight loss although stable compared to 3 months ago.   Overall doing fine.  Cough continues to be much improved.  Coughs in the morning for 15 to 30 minutes.  Cough throughout the day is much better, rarely at all.  Improved quality of life.  Endorses occasional dysphagia.  Agreeable to repeat CT scan to evaluate current status of disease on the radiographs.  Past Medical History:  Diagnosis Date   Allergic rhinitis, cause unspecified    Bronchiectasis without acute exacerbation (HCC)    Cough    Sinusitis      Family History  Problem Relation Age of Onset   Emphysema Sister    Stroke Mother    Clotting disorder Mother      Past Surgical History:  Procedure Laterality Date   APPENDECTOMY     CATARACT EXTRACTION  08/2013   CHOLECYSTECTOMY     TUBAL LIGATION     VESICOVAGINAL FISTULA CLOSURE W/ TAH      Social History   Socioeconomic History   Marital status: Widowed    Spouse name: Not on file   Number of children: Not on file   Years of education: Not on file   Highest education level: Not on file  Occupational History   Occupation: realtor  Tobacco Use   Smoking status: Former    Packs/day: 1.00    Years: 20.00    Pack years: 20.00    Types: Cigarettes    Quit date: 12/14/1985    Years since  quitting: 36.1   Smokeless tobacco: Never  Vaping Use   Vaping Use: Never used  Substance and Sexual Activity   Alcohol use: Not Currently    Alcohol/week: 1.0 standard drink    Types: 1 Glasses of wine per week    Comment: occ   Drug use: No   Sexual activity: Not on file  Other Topics Concern   Not on file  Social History Narrative   Not on file   Social Determinants of Health   Financial Resource Strain: Not on file  Food Insecurity: Not on file  Transportation Needs: Not on file  Physical Activity: Not on file  Stress: Not on file  Social Connections: Not on file  Intimate Partner Violence: Not on file     No Known Allergies   Immunization History  Administered Date(s) Administered   Fluad Quad(high Dose 65+) 09/24/2020   Influenza Split 09/13/2012, 09/06/2014   Influenza Whole 09/09/2009, 08/14/2010, 09/14/2011   Influenza, High Dose Seasonal PF 10/10/2015, 09/02/2021   Influenza,inj,Quad PF,6+ Mos 09/13/2013, 09/21/2016, 08/26/2017, 08/22/2018   Influenza-Unspecified 08/29/2019   Moderna Sars-Covid-2 Vaccination 01/03/2020, 01/31/2020, 08/02/2020, 05/06/2021   Pfizer Covid-19 Vaccine Bivalent Booster 5y-11y 11/24/2021   Pneumococcal Conjugate-13 11/24/2014   Pneumococcal Polysaccharide-23 07/17/2009    Outpatient  Medications Prior to Visit  Medication Sig Dispense Refill   acetaminophen (TYLENOL) 500 MG tablet Take 1,000 mg by mouth every 6 (six) hours as needed for moderate pain.     acyclovir (ZOVIRAX) 400 MG tablet Take 400 mg by mouth 2 (two) times daily.     albuterol (VENTOLIN HFA) 108 (90 Base) MCG/ACT inhaler Inhale 2 puffs into the lungs every 6 (six) hours as needed for wheezing or shortness of breath.     ascorbic acid (VITAMIN C) 500 MG tablet Take 500 mg by mouth daily.     atorvastatin (LIPITOR) 20 MG tablet Take 20 mg by mouth daily.     B Complex Vitamins (B COMPLEX-B12 PO) Take 1 tablet by mouth daily.     benzonatate (TESSALON PERLES) 100 MG  capsule Take 1 capsule (100 mg total) by mouth 3 (three) times daily as needed for cough. 90 capsule 5   calcium carbonate (OSCAL) 1500 (600 Ca) MG TABS tablet Take 600 mg of elemental calcium by mouth 2 (two) times daily with a meal.     cetirizine (ZYRTEC) 10 MG tablet Take 10 mg by mouth daily.     Cholecalciferol (VITAMIN D3) 125 MCG (5000 UT) TABS Take 1 tablet by mouth daily.     CLOFAZIMINE PO Take 100 mg by mouth daily.     ethambutol (MYAMBUTOL) 400 MG tablet Take 3 tablets (1,200 mg total) by mouth daily. 270 tablet 3   Flaxseed, Linseed, (FLAX SEED OIL) 1300 MG CAPS Take 1 tablet by mouth daily.     Fluticasone-Umeclidin-Vilant (TRELEGY ELLIPTA) 100-62.5-25 MCG/INH AEPB Inhale 1 puff into the lungs daily. 60 each 5   Melatonin 10 MG CAPS Take 10 mg by mouth at bedtime.     omeprazole (PRILOSEC) 40 MG capsule Take 1 capsule (40 mg total) by mouth daily.     Probiotic Product (PROBIOTIC DAILY PO) Take 1 capsule by mouth daily.     Propylene Glycol (SYSTANE BALANCE OP) Place 1 drop into the left eye daily as needed (dry eyes).     Respiratory Therapy Supplies (FLUTTER) DEVI Use as directed. 1 each 0   rifampin (RIFADIN) 300 MG capsule Take 2 capsules (600 mg total) by mouth daily. 180 capsule 3   Spacer/Aero-Holding Chambers (AEROCHAMBER MV) inhaler Use as instructed 1 each 2   TURMERIC PO Take 2,000 mg by mouth daily.      zinc gluconate 50 MG tablet Take 50 mg by mouth daily.     diazepam (VALIUM) 10 MG tablet Take 10 mg by mouth every 6 (six) hours as needed for anxiety (For MRI).     diclofenac (VOLTAREN) 75 MG EC tablet      docusate sodium (COLACE) 100 MG capsule Take 1 capsule (100 mg total) by mouth 2 (two) times daily. 10 capsule 0   megestrol (MEGACE ES) 625 MG/5ML suspension Take 5 mLs (625 mg total) by mouth daily. 150 mL 5   polyethylene glycol (MIRALAX / GLYCOLAX) 17 g packet Take 17 g by mouth daily as needed for mild constipation. 14 each 0   sodium chloride HYPERTONIC 3  % nebulizer solution INHALE THE CONTENTS OF 1 VIAL ( 4ML ) VIA NEBULIZER TWICE DAILY 720 mL 3   No facility-administered medications prior to visit.    Review of Systems  No chest pain.  No hemoptysis.  No worsening sputum production or change in quality of sputum.   Objective:   Vitals:   01/23/22 1539  BP: 102/64  Pulse: 96  Temp: 98 F (36.7 C)  TempSrc: Oral  SpO2: 99%  Weight: 105 lb (47.6 kg)  Height: _0  (1.6 m)   99% on   RA BMI Readings from Last 3 Encounters:  01/23/22 18.60 kg/m  01/13/22 18.28 kg/m  11/11/21 18.60 kg/m   Wt Readings from Last 3 Encounters:  01/23/22 105 lb (47.6 kg)  01/13/22 103 lb 3.2 oz (46.8 kg)  11/11/21 105 lb (47.6 kg)    Physical Exam BP 102/64 (BP Location: Left Arm, Patient Position: Sitting, Cuff Size: Normal)    Pulse 96    Temp 98 F (36.7 C) (Oral)    Ht _1  (1.6 m)    Wt 105 lb (47.6 kg)    SpO2 99%    BMI 18.60 kg/m  General: Well-appearing in no acute distress, thin Eyes: EOMI, icterus Respiratory: Completely clear, normal work of breathing Cardiovascular: Regular rhythm, no murmur Extremities: Warm, no edema     CBC    Component Value Date/Time   WBC 7.1 09/27/2021 0539   RBC 3.88 09/27/2021 0539   HGB 11.9 (L) 09/27/2021 0539   HCT 35.5 (L) 09/27/2021 0539   PLT 229 09/27/2021 0539   MCV 91.5 09/27/2021 0539   MCH 30.7 09/27/2021 0539   MCHC 33.5 09/27/2021 0539   RDW 15.0 09/27/2021 0539   LYMPHSABS 1.3 09/24/2021 0519   MONOABS 0.5 09/24/2021 0519   EOSABS 0.2 09/24/2021 0519   BASOSABS 0.0 09/24/2021 0519    CHEMISTRY No results for input(s): NA, K, CL, CO2, GLUCOSE, BUN, CREATININE, CALCIUM, MG, PHOS in the last 168 hours. CrCl cannot be calculated (Patient's most recent lab result is older than the maximum 21 days allowed.).  12/28/2019: IgG 954 IgM 96 IgA 289 IgE 10 IgG subclasses normal (other than increased IgG subclass 3)  Cultures: 3/10322 -  pending 11/23/2019-MAI 05/17/2019-MAI 03/15/2019-MAI 01/10/2019 MAI 11/07/2018-MAI 10/10/2018-MAI  08/22/2018-MAI 07/11/2018-MAI 05/31/2018-MAI MAI susceptibilities-resistant clarithromycin, linezolid.  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  Susceptible to amikacin, cefepime, clarithromycin, imipenem, linezolid, trimethoprim sulfamethoxazole. 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  Tree-in-bud opacities diffusely.  Mucus impaction in airways.   Pulmonary Functions Testing Results: PFT Results Latest Ref Rng & Units 04/18/2018  FVC-Pre L 2.08  FVC-Predicted Pre % 79  Pre FEV1/FVC % % 75  FEV1-Pre L 1.57  FEV1-Predicted Pre % 80   2019-no significant obstruction, mildly reduced FVC.     Assessment & Plan:     ICD-10-CM   1. BRONCHIECTASIS  J47.9 CT Chest High Resolution       Dyspnea on exertion likely due to chronic bronchiectasis-improved on LAMA/LABA; worsed when back off these meds. Component of deconditioning.  Has seasonal allergies, describes bronchospasm, possible asthma. 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  No obvious immunodeficiencies.  Progressive on CT over the last 6 years.  No evidence of hypersensitivity pneumonitis from inhaled amikacin on CT scan.  With ongoing worsening cough and  poor appetite.  Concerning for exacerbation. - Increase hypertonic saline to twice a day during exacerbation  and continued twice daily usage (daily currently) -Continue current 3 drug MAI therapy and ID follow-up.  I agree with Dr. Megan Salon that she has limited treatment options and would likely progress faster off meds --Stopped inhaled amikacin and cough has improved dramatically -CT chest hi res ordered today  Weight loss- concerned this may be a MAC secondary effect, complicated by fatigue and DOE.  Related to metabolic needs from dyspnea, cough.  Poor appetite.  This is ongoing. Worsened with recent SBO.   RTC in 3 months with Dr. Silas Flood.     Current Outpatient Medications:    acetaminophen (TYLENOL) 500 MG tablet, Take 1,000 mg by mouth every 6 (six) hours as needed for moderate pain., Disp: , Rfl:    acyclovir (ZOVIRAX) 400 MG tablet, Take 400 mg by mouth 2 (two) times daily., Disp: , Rfl:    albuterol (VENTOLIN HFA) 108 (90 Base) MCG/ACT inhaler, Inhale 2 puffs into the lungs every 6 (six) hours as needed for wheezing or shortness of breath., Disp: , Rfl:    ascorbic acid (VITAMIN C) 500 MG tablet, Take 500 mg by mouth daily., Disp: , Rfl:    atorvastatin (LIPITOR) 20 MG tablet, Take 20 mg by mouth daily., Disp: , Rfl:    B Complex Vitamins (B COMPLEX-B12 PO), Take 1 tablet by mouth daily., Disp: , Rfl:    benzonatate (TESSALON PERLES) 100 MG capsule, Take 1 capsule (100 mg total) by mouth 3 (three) times daily as needed for cough., Disp: 90 capsule, Rfl: 5   calcium carbonate (OSCAL) 1500 (600 Ca) MG TABS tablet, Take 600 mg of elemental calcium by mouth 2 (two) times daily with a meal., Disp: , Rfl:    cetirizine (ZYRTEC) 10 MG tablet, Take 10 mg by mouth daily., Disp: , Rfl:    Cholecalciferol (VITAMIN D3) 125 MCG (5000 UT) TABS, Take 1 tablet by mouth daily., Disp: , Rfl:    CLOFAZIMINE PO, Take 100 mg by mouth daily., Disp: , Rfl:    ethambutol (MYAMBUTOL) 400 MG tablet, Take  3 tablets (1,200 mg total) by mouth daily., Disp: 270 tablet, Rfl: 3   Flaxseed, Linseed, (FLAX SEED OIL) 1300 MG CAPS, Take 1 tablet by mouth daily., Disp: , Rfl:    Fluticasone-Umeclidin-Vilant (TRELEGY ELLIPTA) 100-62.5-25 MCG/INH AEPB, Inhale 1 puff into the lungs daily., Disp: 60 each, Rfl: 5   Melatonin 10 MG CAPS, Take 10 mg by mouth at bedtime., Disp: , Rfl:    omeprazole (PRILOSEC) 40 MG capsule, Take 1 capsule (40 mg total) by mouth daily., Disp: , Rfl:    Probiotic Product (PROBIOTIC DAILY PO), Take 1 capsule by mouth daily., Disp: , Rfl:    Propylene Glycol (SYSTANE BALANCE OP), Place 1 drop into the left eye daily as needed (dry eyes)., Disp: , Rfl:    Respiratory Therapy Supplies (FLUTTER) DEVI, Use as directed., Disp: 1 each, Rfl: 0   rifampin (RIFADIN) 300 MG capsule, Take 2 capsules (600 mg total) by mouth daily., Disp: 180 capsule, Rfl: 3   Spacer/Aero-Holding Chambers (AEROCHAMBER MV) inhaler, Use as instructed, Disp: 1 each, Rfl: 2   TURMERIC PO, Take 2,000 mg by mouth daily. , Disp: , Rfl:    zinc gluconate 50 MG tablet, Take 50 mg by mouth daily., Disp: , Rfl:

## 2022-01-23 NOTE — Patient Instructions (Signed)
Nice to see you again  I think it is okay to stay off the amikacin this is helped her cough  I ordered the CT scan to look at the lungs and see how things are going, we will also see the esophagus on the scan I will let you know what it looks like  Return to clinic in 3 months or sooner as needed with Dr. Silas Flood

## 2022-02-10 ENCOUNTER — Other Ambulatory Visit: Payer: Self-pay

## 2022-02-10 ENCOUNTER — Ambulatory Visit (HOSPITAL_COMMUNITY)
Admission: RE | Admit: 2022-02-10 | Discharge: 2022-02-10 | Disposition: A | Discharge status: Home / Self Care | Payer: Medicare Other | Source: Ambulatory Visit | Attending: Pulmonary Disease | Admitting: Pulmonary Disease

## 2022-02-10 ENCOUNTER — Encounter (HOSPITAL_COMMUNITY): Payer: Self-pay

## 2022-02-10 DIAGNOSIS — I2510 Atherosclerotic heart disease of native coronary artery without angina pectoris: Secondary | ICD-10-CM | POA: Diagnosis not present

## 2022-02-10 DIAGNOSIS — J479 Bronchiectasis, uncomplicated: Secondary | ICD-10-CM

## 2022-02-10 DIAGNOSIS — R042 Hemoptysis: Secondary | ICD-10-CM | POA: Diagnosis not present

## 2022-02-10 DIAGNOSIS — J929 Pleural plaque without asbestos: Secondary | ICD-10-CM | POA: Diagnosis not present

## 2022-02-16 ENCOUNTER — Telehealth: Payer: Self-pay | Admitting: Pulmonary Disease

## 2022-02-16 NOTE — Telephone Encounter (Signed)
Called patient and she would like her CT scan results if possible.  ? ?Please advise Dr Silas Flood ?

## 2022-02-17 NOTE — Telephone Encounter (Signed)
Called patient and went to voicemail. Left message stating will contact later in week to discuss results of CT scan.

## 2022-02-19 NOTE — Progress Notes (Signed)
Called and discussed CT findings - overall consistent with progression of MDR NTM disease. Has upcoming appointment with Dr. Megan Salon with ID. Advocated for her to continue oral NTM therapy for now as palliation of disease. Ok with me to stay off inhaled amikacin as cough has improved while off this therapy. Routing to Dr. Megan Salon as Juluis Rainier.

## 2022-02-19 NOTE — Telephone Encounter (Signed)
Dr Silas Flood called patient this morning to go over CT scan results. Nothing further needed  ?

## 2022-02-27 DIAGNOSIS — L03211 Cellulitis of face: Secondary | ICD-10-CM | POA: Diagnosis not present

## 2022-03-02 DIAGNOSIS — L0390 Cellulitis, unspecified: Secondary | ICD-10-CM | POA: Diagnosis not present

## 2022-03-16 DIAGNOSIS — L300 Nummular dermatitis: Secondary | ICD-10-CM | POA: Diagnosis not present

## 2022-03-27 DIAGNOSIS — L300 Nummular dermatitis: Secondary | ICD-10-CM | POA: Diagnosis not present

## 2022-03-27 DIAGNOSIS — L853 Xerosis cutis: Secondary | ICD-10-CM | POA: Diagnosis not present

## 2022-04-02 ENCOUNTER — Telehealth: Payer: Self-pay | Admitting: Pharmacist

## 2022-04-02 NOTE — Telephone Encounter (Signed)
2 bottles of clofazimine are in the pharmacy office for patient to pick up at her next office visit with Dr. Megan Salon on 04/07/22. ? ?Kiara Medina L. Abie Killian, PharmD ?[facility] Clinical Pharmacist Practitioner ? ?

## 2022-04-07 ENCOUNTER — Other Ambulatory Visit: Payer: Self-pay

## 2022-04-07 ENCOUNTER — Other Ambulatory Visit: Payer: Self-pay | Admitting: Pharmacist

## 2022-04-07 ENCOUNTER — Other Ambulatory Visit (HOSPITAL_COMMUNITY): Payer: Self-pay

## 2022-04-07 ENCOUNTER — Telehealth: Payer: Self-pay

## 2022-04-07 ENCOUNTER — Encounter: Payer: Self-pay | Admitting: Internal Medicine

## 2022-04-07 ENCOUNTER — Ambulatory Visit: Payer: Medicare Other | Admitting: Internal Medicine

## 2022-04-07 DIAGNOSIS — R634 Abnormal weight loss: Secondary | ICD-10-CM | POA: Diagnosis not present

## 2022-04-07 DIAGNOSIS — R1310 Dysphagia, unspecified: Secondary | ICD-10-CM | POA: Diagnosis not present

## 2022-04-07 DIAGNOSIS — A310 Pulmonary mycobacterial infection: Secondary | ICD-10-CM | POA: Diagnosis not present

## 2022-04-07 MED ORDER — RIFAMPIN 300 MG PO CAPS
600.0000 mg | ORAL_CAPSULE | Freq: Every day | ORAL | 3 refills | Status: DC
  Filled 2022-04-07: qty 180, 90d supply, fill #0

## 2022-04-07 MED ORDER — ETHAMBUTOL HCL 400 MG PO TABS
800.0000 mg | ORAL_TABLET | Freq: Every day | ORAL | 3 refills | Status: DC
  Filled 2022-04-07: qty 180, 90d supply, fill #0

## 2022-04-07 MED ORDER — ETHAMBUTOL HCL 400 MG PO TABS
1200.0000 mg | ORAL_TABLET | Freq: Every day | ORAL | 3 refills | Status: DC
  Filled 2022-04-07: qty 270, 90d supply, fill #0

## 2022-04-07 NOTE — Progress Notes (Signed)
?  ? ? ? ? ?[facility] ? ?Patient Active Problem List  ? Diagnosis Date Noted  ? Odynophagia 01/13/2022  ?  Priority: High  ? Unintentional weight loss 05/17/2019  ?  Priority: High  ? 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  Priority: High  ? BRONCHIECTASIS 05/23/2009  ?  Priority: Medium   ? Protein-calorie malnutrition, severe 09/19/2021  ? SBO (small bowel obstruction) ([place]) 09/18/2021  ? Depression 06/27/2020  ? Dysphonia 01/11/2018  ? Right shoulder pain 01/21/2017  ? Encounter for hepatitis C screening test for low risk patient 01/21/2017  ? GERD (gastroesophageal reflux disease) 02/21/2016  ? 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 Dyslipidemia 11/14/2014  ? Allergic rhinitis 05/03/2009  ? ? ?Patient's Medications  ?New Prescriptions  ? No medications on file  ?Previous Medications  ? ACETAMINOPHEN (TYLENOL) 500 MG TABLET    Take 1,000 mg by mouth every 6 (six) hours as needed for moderate pain.  ? ACYCLOVIR (ZOVIRAX) 400 MG TABLET    Take 400 mg by mouth 2 (two) times daily.  ? ALBUTEROL (VENTOLIN HFA) 108 (90 BASE) MCG/ACT INHALER    Inhale 2 puffs into the lungs every 6 (six) hours as needed for wheezing or shortness of breath.  ? ASCORBIC ACID (VITAMIN C) 500 MG TABLET    Take 500 mg by mouth daily.  ? ATORVASTATIN (LIPITOR) 20 MG TABLET    Take 20 mg by mouth daily.  ? B COMPLEX VITAMINS (B COMPLEX-B12 PO)    Take 1 tablet by mouth daily.  ? BENZONATATE (TESSALON PERLES) 100 MG CAPSULE    Take 1 capsule (100 mg total) by mouth 3 (three) times daily as needed for cough.  ? CALCIUM CARBONATE (OSCAL) 1500 (600 CA) MG TABS TABLET    Take 600 mg of elemental calcium by mouth 2 (two) times daily with a meal.  ? CETIRIZINE (ZYRTEC) 10 MG TABLET    Take 10 mg by mouth daily.  ? CHOLECALCIFEROL (VITAMIN D3) 125 MCG (5000 UT) TABS    Take 1 tablet by mouth daily.  ? CLOFAZIMINE PO    Take 100 mg by mouth daily.  ? DOXYCYCLINE (VIBRAMYCIN) 100 MG CAPSULE    Take 100 mg by  mouth 2 (two) times daily.  ? ETHAMBUTOL (MYAMBUTOL) 400 MG TABLET    Take 3 tablets (1,200 mg total) by mouth daily.  ? FLAXSEED, LINSEED, (FLAX SEED OIL) 1300 MG CAPS    Take 1 tablet by mouth daily.  ? FLUTICASONE-UMECLIDIN-VILANT (TRELEGY ELLIPTA) 100-62.5-25 MCG/INH AEPB    Inhale 1 puff into the lungs daily.  ? MELATONIN 10 MG CAPS    Take 10 mg by mouth at bedtime.  ? OMEPRAZOLE (PRILOSEC) 40 MG CAPSULE    Take 1 capsule (40 mg total) by mouth daily.  ? PROBIOTIC PRODUCT (PROBIOTIC DAILY PO)    Take 1 capsule by mouth daily.  ? PROPYLENE GLYCOL (SYSTANE BALANCE OP)    Place 1 drop into the left eye daily as needed (dry eyes).  ? RESPIRATORY THERAPY SUPPLIES (FLUTTER) DEVI    Use as directed.  ? RIFAMPIN (RIFADIN) 300 MG CAPSULE    Take 2 capsules (600 mg total) by mouth daily.  ? SPACER/AERO-HOLDING CHAMBERS (AEROCHAMBER MV) INHALER    Use as instructed  ? TURMERIC PO    Take 2,000 mg by mouth daily.   ? ZINC GLUCONATE 50 MG TABLET    Take 50 mg by mouth daily.  ?Modified Medications  ?  No medications on file  ?Discontinued Medications  ? No medications on file  ? ? ?Subjective: ?Kiara Medina is in for her routine follow-up appointment.  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  She was not treated at that time.  She tested positive again in March 2015.  She started on azithromycin, ethambutol and rifampin in December 2015 and completed the course of therapy in November 2016. ? ?She relapsed in June 2018 and started on azithromycin, ethambutol and rifampin again.  She saw Dr. Brigitte Pulse at [facility] in November 2018.  Aerosolized amikacin was added in January 2019  because of evolving resistance.  In September 2019 azithromycin was changed to clofazimine and she continued ethambutol, rifampin and aerosolized amikacin. ? ?She was hospitalized last fall with partial small bowel obstruction.  She was hospitalized for 10 days and did not receive any of her aerosolized amikacin  while hospitalized.  She noted that her chronic cough improved significantly while she was off of it.  Her cough returned when she restarted it after discharge.  She saw me for her hospital follow-up visit in late November at which time we decided to have her stop amikacin. ? ?She has noted a recent increase in dry cough and more shortness of breath.  She had a follow-up chest CT scan on 02/10/2022 which showed: ? ?PRESSION: ?1. Redemonstrated, very extensive, diffuse bilateral bronchiectasis, ?bronchiolar thickening, and plugging, with very extensive clustered ?centrilobular and tree-in-bud nodularity as well as confluent ?consolidations and cavitary lesions, substantially increased in ?comparison to prior examination, particularly with worsened ?nodularity and cavitation in the right lung. Findings are consistent ?with severe, significantly worsened MAC infection. ?2. Coronary artery disease. ? ?Review of Systems: ?Review of Systems  ?Constitutional:  Positive for malaise/fatigue and weight loss. Negative for chills, diaphoresis and fever.  ?Eyes:  Negative for blurred vision and double vision.  ?Respiratory:  Positive for cough, sputum production and shortness of breath. Negative for hemoptysis.   ?Cardiovascular:  Negative for chest pain.  ?Gastrointestinal:  Positive for heartburn. Negative for abdominal pain, diarrhea, nausea and vomiting.  ?     There has been no change in her frequent bowel movements.  ?Skin:  Positive for itching. Negative for rash.  ?     Very dry skin recently.  ? ?Past Medical History:  ?Diagnosis Date  ? Allergic rhinitis, cause unspecified   ? Bronchiectasis without acute exacerbation (Kiara Medina)   ? Cough   ? Sinusitis   ? ? ?Social History  ? ?Tobacco Use  ? Smoking status: Former  ?  Packs/day: 1.00  ?  Years: 20.00  ?  Pack years: 20.00  ?  Types: Cigarettes  ?  Quit date: 12/14/1985  ?  Years since quitting: 36.3  ? Smokeless tobacco: Never  ?Vaping Use  ? Vaping Use: Never used   ?Substance Use Topics  ? Alcohol use: Not Currently  ?  Alcohol/week: 1.0 standard drink  ?  Types: 1 Glasses of wine per week  ?  Comment: occ  ? Drug use: No  ? ? ?Family History  ?Problem Relation Age of Onset  ? Emphysema Sister   ? Stroke Mother   ? Clotting disorder Mother   ? ? ?No Known Allergies ? ?Objective: ?Vitals:  ? 04/07/22 1358  ?BP: 112/75  ?Pulse: 97  ?Resp: 16  ?SpO2: 98%  ?Weight: 102 lb 12.8 oz (46.6 kg)  ?Height: _0  (1.6 m)  ? ?Body mass index is  18.21 kg/m?. ? ?Physical Exam ?Constitutional:   ?   Comments: She is in good spirits.  She has lost 2.2 pounds since her last visit.  ?Cardiovascular:  ?   Rate and Rhythm: Normal rate and regular rhythm.  ?   Heart sounds: No murmur heard. ?Pulmonary:  ?   Effort: Pulmonary effort is normal.  ?   Breath sounds: Rales present. No wheezing or rhonchi.  ?   Comments: She has a few crackles in her right base posteriorly. ?Abdominal:  ?   Palpations: Abdomen is soft.  ?   Tenderness: There is no abdominal tenderness.  ?Psychiatric:     ?   Mood and Affect: Mood normal.  ? ? ? ? ?  ?Problem List Items Addressed This Visit   ? ?  ? High  ? 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  She has had significant radiographic worsening by chest CT over the past 2 years.  She has had clinical worsening over the past several months.  Some of the worsening may be due to having stopped aerosolized amikacin last November.  She is willing to restart it and continue clofazimine, ethambutol and rifampin to see if she notes any improvement.  She will follow-up in 6 weeks. ? ?  ?  ? Unintentional weight loss  ?  She has had significant weight loss over the past few years and did not notice any improvement while on Marinol recently.  She does not think that she is currently continuing to lose weight. ? ?  ?  ? Odynophagia  ?  She is not having any significant problems swallowing her antibiotics currently. ? ?  ?  ?Michel Bickers, MD ?[facility] ?[place] ?[phone] pager   [phone] cell ?04/07/2022, 2:32 PM ?

## 2022-04-07 NOTE — Assessment & Plan Note (Signed)
She has had significant weight loss over the past few years and did not notice any improvement while on Marinol recently.  She does not think that she is currently continuing to lose weight. ?

## 2022-04-07 NOTE — Assessment & Plan Note (Signed)
She has had significant radiographic worsening by chest CT over the past 2 years.  She has had clinical worsening over the past several months.  Some of the worsening may be due to having stopped aerosolized amikacin last November.  She is willing to restart it and continue clofazimine, ethambutol and rifampin to see if she notes any improvement.  She will follow-up in 6 weeks. ?

## 2022-04-07 NOTE — Telephone Encounter (Signed)
[facility] Patient Advocate Encounter ?  ?Received notification from [place] Medicare Part D that prior authorization for [place] is required. ?  ?PA submitted on 04/07/22 ?Key [id number] ?Status is pending ?   ?[facility] will continue to follow. ? ? ?Ileene Patrick, CPhT ?Specialty Pharmacy Patient Advocate ?[facility] ?Phone: [phone] ?Fax:  [phone]  ?

## 2022-04-07 NOTE — Assessment & Plan Note (Signed)
She is not having any significant problems swallowing her antibiotics currently. ?

## 2022-04-08 ENCOUNTER — Telehealth: Payer: Self-pay

## 2022-04-08 ENCOUNTER — Other Ambulatory Visit (HOSPITAL_COMMUNITY): Payer: Self-pay

## 2022-04-08 ENCOUNTER — Other Ambulatory Visit: Payer: Self-pay

## 2022-04-08 DIAGNOSIS — A310 Pulmonary mycobacterial infection: Secondary | ICD-10-CM

## 2022-04-08 MED ORDER — RIFAMPIN 300 MG PO CAPS: 600.0000 mg | ORAL_CAPSULE | Freq: Every day | ORAL | 3 refills | Status: DC

## 2022-04-08 MED ORDER — ETHAMBUTOL HCL 400 MG PO TABS: 800.0000 mg | ORAL_TABLET | Freq: Every day | ORAL | 3 refills | Status: DC

## 2022-04-08 NOTE — Telephone Encounter (Signed)
Patient called office stating prescription for Ethambutol and Rifampin were sent to incorrect pharmacy. States her insurance requires her to use [facility] and that [facility] is not a preferred pharmacy. ?[facility].  ?Leatrice Jewels, RMA  ?

## 2022-04-09 ENCOUNTER — Other Ambulatory Visit (HOSPITAL_COMMUNITY): Payer: Self-pay

## 2022-04-09 ENCOUNTER — Telehealth: Payer: Self-pay

## 2022-04-09 ENCOUNTER — Other Ambulatory Visit: Payer: Self-pay | Admitting: Pharmacist

## 2022-04-09 DIAGNOSIS — A310 Pulmonary mycobacterial infection: Secondary | ICD-10-CM

## 2022-04-09 MED ORDER — ARIKAYCE 590 MG/8.4ML IN SUSP: 590.0000 mg | Freq: Every day | RESPIRATORY_TRACT | 11 refills | Status: DC

## 2022-04-09 NOTE — Telephone Encounter (Signed)
FYI Dr. Megan Salon ?

## 2022-04-09 NOTE — Telephone Encounter (Signed)
[facility] Patient Advocate Encounter ? ?Prior Authorization for Viona Gilmore has been approved.   ? ?PA# [id number] ?Effective dates: 04/07/22 through 04/08/23 ? ?Patients co-pay is $2259.83.  ? ?Patient said she have 3 months supply of the medication and she will use that until her 6 weeks follow up with Dr Megan Salon.  ?Then she will decide whether are not she will continue on the medication. ? ?[facility] will continue to follow. ? ?Ileene Patrick, CPhT ?Specialty Pharmacy Patient Advocate ?[facility] ?Phone: [phone] ?Fax:  [phone]  ?

## 2022-04-22 ENCOUNTER — Ambulatory Visit: Payer: Medicare Other | Admitting: Pulmonary Disease

## 2022-04-22 ENCOUNTER — Encounter: Payer: Self-pay | Admitting: Pulmonary Disease

## 2022-04-22 VITALS — BP 114/62 | HR 93 | Temp 98.2°F | Ht 63.0 in | Wt 103.8 lb

## 2022-04-22 DIAGNOSIS — J479 Bronchiectasis, uncomplicated: Secondary | ICD-10-CM

## 2022-04-22 DIAGNOSIS — A310 Pulmonary mycobacterial infection: Secondary | ICD-10-CM | POA: Diagnosis not present

## 2022-04-22 MED ORDER — OMEPRAZOLE 40 MG PO CPDR: 40.0000 mg | DELAYED_RELEASE_CAPSULE | Freq: Every day | ORAL | 3 refills | Status: DC

## 2022-04-22 NOTE — Progress Notes (Signed)
? ?Synopsis: Referred in 2010 for bronchiectasis by Nicoletta Dress, MD.  Previously patient of Dr. Normajean Baxter and Dr. Lake Bells and Dr. Carlis Abbott. ? ?Subjective:  ? ?PATIENT ID: Kiara Medina GENDER: female DOB: 27-Apr-1946, MRN: [id number] ? ?Chief Complaint  ?Patient presents with  ? Follow-up  ?  Pt states she is back on the [place]. She states she still has a cough but its not as bad anymore. She also states that she can not tell a difference in her shortness of breath. Pt states she had a CT scan done recently.   ? ?[name] is a 76 y.o. woman with a history of MDR MAC and bronchiectasis who presents for follow up.  ? ?Overall doing fine.  Cough is markedly improved while off amikacin.  Interim since last visit evidently cough had worsened.  I was unaware of this.  Worse in the morning.  A couple of weeks ago met with ID.  CT scan of the interim ordered by me reviewed and indicates worsening signs of NTM disease.  This was discussed at that time and decision was made to resume amikacin.  Evidently, this is now improved more recent cough.  This is good news.  Dyspnea unchanged. ? ?Past Medical History:  ?Diagnosis Date  ? Allergic rhinitis, cause unspecified   ? Bronchiectasis without acute exacerbation ([place])   ? Cough   ? Sinusitis   ?  ? ?Family History  ?Problem Relation Age of Onset  ? Emphysema Sister   ? Stroke Mother   ? Clotting disorder Mother   ?  ? ?Past Surgical History:  ?Procedure Laterality Date  ? APPENDECTOMY    ? CATARACT EXTRACTION  08/2013  ? CHOLECYSTECTOMY    ? TUBAL LIGATION    ? VESICOVAGINAL FISTULA CLOSURE W/ TAH    ? ? ?Social History  ? ?Socioeconomic History  ? Marital status: Widowed  ?  Spouse name: Not on file  ? Number of children: Not on file  ? Years of education: Not on file  ? Highest education level: Not on file  ?Occupational History  ? Occupation: realtor  ?Tobacco Use  ? Smoking status: Former  ?  Packs/day: 1.00  ?  Years: 20.00  ?  Pack years: 20.00  ?  Types: Cigarettes  ?   Quit date: 12/14/1985  ?  Years since quitting: 36.3  ? Smokeless tobacco: Never  ?Vaping Use  ? Vaping Use: Never used  ?Substance and Sexual Activity  ? Alcohol use: Not Currently  ?  Alcohol/week: 1.0 standard drink  ?  Types: 1 Glasses of wine per week  ?  Comment: occ  ? Drug use: No  ? Sexual activity: Not on file  ?Other Topics Concern  ? Not on file  ?Social History Narrative  ? Not on file  ? ?Social Determinants of Health  ? ?Financial Resource Strain: Not on file  ?Food Insecurity: Not on file  ?Transportation Needs: Not on file  ?Physical Activity: Not on file  ?Stress: Not on file  ?Social Connections: Not on file  ?Intimate Partner Violence: Not on file  ?  ? ?No Known Allergies  ? ?Immunization History  ?Administered Date(s) Administered  ? Fluad Quad(high Dose 65+) 09/24/2020  ? Influenza Split 09/13/2012, 09/06/2014  ? Influenza Whole 09/09/2009, 08/14/2010, 09/14/2011  ? Influenza, High Dose Seasonal PF 10/10/2015, 09/02/2021  ? Influenza,inj,Quad PF,6+ Mos 09/13/2013, 09/21/2016, 08/26/2017, 08/22/2018  ? Influenza-Unspecified 08/29/2019  ? Moderna Sars-Covid-2 Vaccination 01/03/2020, 01/31/2020, 08/02/2020, 05/06/2021  ?  Ambulance person Booster 5y-11y 11/24/2021  ? Pneumococcal Conjugate-13 11/24/2014  ? Pneumococcal Polysaccharide-23 07/17/2009  ? ? ?Outpatient Medications Prior to Visit  ?Medication Sig Dispense Refill  ? acetaminophen (TYLENOL) 500 MG tablet Take 1,000 mg by mouth every 6 (six) hours as needed for moderate pain.    ? acyclovir (ZOVIRAX) 400 MG tablet Take 400 mg by mouth 2 (two) times daily.    ? albuterol (VENTOLIN HFA) 108 (90 Base) MCG/ACT inhaler Inhale 2 puffs into the lungs every 6 (six) hours as needed for wheezing or shortness of breath.    ? Amikacin Sulfate Liposome (ARIKAYCE) 590 MG/8.4ML SUSP Inhale 590 mg into the lungs daily. 252 mL 11  ? ascorbic acid (VITAMIN C) 500 MG tablet Take 500 mg by mouth daily.    ? atorvastatin (LIPITOR) 20 MG tablet  Take 20 mg by mouth daily.    ? B Complex Vitamins (B COMPLEX-B12 PO) Take 1 tablet by mouth daily.    ? benzonatate (TESSALON PERLES) 100 MG capsule Take 1 capsule (100 mg total) by mouth 3 (three) times daily as needed for cough. 90 capsule 5  ? calcium carbonate (OSCAL) 1500 (600 Ca) MG TABS tablet Take 600 mg of elemental calcium by mouth 2 (two) times daily with a meal.    ? cetirizine (ZYRTEC) 10 MG tablet Take 10 mg by mouth daily.    ? Cholecalciferol (VITAMIN D3) 125 MCG (5000 UT) TABS Take 1 tablet by mouth daily.    ? CLOFAZIMINE PO Take 100 mg by mouth daily.    ? ethambutol (MYAMBUTOL) 400 MG tablet Take 2 tablets (800 mg total) by mouth daily. 180 tablet 3  ? Flaxseed, Linseed, (FLAX SEED OIL) 1300 MG CAPS Take 1 tablet by mouth daily.    ? Melatonin 10 MG CAPS Take 10 mg by mouth at bedtime.    ? omeprazole (PRILOSEC) 40 MG capsule Take 1 capsule (40 mg total) by mouth daily.    ? Probiotic Product (PROBIOTIC DAILY PO) Take 1 capsule by mouth daily.    ? Propylene Glycol (SYSTANE BALANCE OP) Place 1 drop into the left eye daily as needed (dry eyes).    ? Respiratory Therapy Supplies (FLUTTER) DEVI Use as directed. 1 each 0  ? rifampin (RIFADIN) 300 MG capsule Take 2 capsules (600 mg total) by mouth daily. 180 capsule 3  ? Spacer/Aero-Holding Chambers (AEROCHAMBER MV) inhaler Use as instructed 1 each 2  ? TURMERIC PO Take 2,000 mg by mouth daily.     ? zinc gluconate 50 MG tablet Take 50 mg by mouth daily.    ? Fluticasone-Umeclidin-Vilant (TRELEGY ELLIPTA) 100-62.5-25 MCG/INH AEPB Inhale 1 puff into the lungs daily. (Patient not taking: Reported on 04/22/2022) 60 each 5  ? doxycycline (VIBRAMYCIN) 100 MG capsule Take 100 mg by mouth 2 (two) times daily. (Patient not taking: Reported on 04/07/2022)    ? ?No facility-administered medications prior to visit.  ? ? ?Review of Systems  ?No chest pain.  No hemoptysis.  No worsening sputum production or change in quality of sputum. ? ? ?Objective:  ? ?Vitals:   ? 04/22/22 1359  ?Pulse: 93  ?SpO2: 97%  ? ? ?97% on   RA ?BMI Readings from Last 3 Encounters:  ?04/07/22 18.21 kg/m?  ?01/23/22 18.60 kg/m?  ?01/13/22 18.28 kg/m?  ? ?Wt Readings from Last 3 Encounters:  ?04/07/22 102 lb 12.8 oz (46.6 kg)  ?01/23/22 105 lb (47.6 kg)  ?01/13/22 103 lb 3.2 oz (46.8  kg)  ? ? ?Physical Exam ?Pulse 93   SpO2 97%  ?General: Well-appearing in no acute distress, thin ?Eyes: EOMI, icterus ?Respiratory: Completely clear, normal work of breathing ?Cardiovascular: Regular rhythm, no murmur ?Extremities: Warm, no edema ? ? ? ? ?CBC ?   ?Component Value Date/Time  ? WBC 7.1 09/27/2021 0539  ? RBC 3.88 09/27/2021 0539  ? HGB 11.9 (L) 09/27/2021 0539  ? HCT 35.5 (L) 09/27/2021 0539  ? PLT 229 09/27/2021 0539  ? MCV 91.5 09/27/2021 0539  ? MCH 30.7 09/27/2021 0539  ? MCHC 33.5 09/27/2021 0539  ? RDW 15.0 09/27/2021 0539  ? LYMPHSABS 1.3 09/24/2021 0519  ? MONOABS 0.5 09/24/2021 0519  ? EOSABS 0.2 09/24/2021 0519  ? BASOSABS 0.0 09/24/2021 0519  ? ? ?CHEMISTRY ?No results for input(s): NA, K, CL, CO2, GLUCOSE, BUN, CREATININE, CALCIUM, MG, PHOS in the last 168 hours. ?CrCl cannot be calculated (Patient's most recent lab result is older than the maximum 21 days allowed.). ? ?12/28/2019: ?IgG 954 ?IgM 96 ?IgA 289 ?IgE 10 ?IgG subclasses normal (other than increased IgG subclass 3) ? ?Cultures: ?3/10322 - pending ?11/23/2019-MAI ?05/17/2019-MAI 03/15/2019-MAI ?01/10/2019 MAI ?11/07/2018-MAI ?10/10/2018-MAI  ?08/22/2018-MAI ?07/11/2018-MAI ?05/31/2018-MAI ?MAI susceptibilities-resistant clarithromycin, linezolid.  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  Susceptible to amikacin, cefepime, clarithromycin, imipenem, linezolid, trimethoprim sulfamethoxazole. 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 ?Chest Imaging- films personally reviewed: ? ?HRCT chest 04/10/2020-progressive bronchiectasis, new right upper lobe cavity, significantly more nodules of varying

## 2022-04-22 NOTE — Patient Instructions (Signed)
Neck to see you again ? ?I am glad the cough is a bit better on the amikacin ? ?Continue albuterol as needed for wheeze or shortness of breath ? ?Return to clinic in 3 months or sooner as needed with Dr. Silas Flood ?

## 2022-05-18 ENCOUNTER — Telehealth: Payer: Self-pay | Admitting: Pharmacist

## 2022-05-18 NOTE — Telephone Encounter (Signed)
Patient picked up 2 bottles of clofazimine (~3 months supply) on 04/07/22 (forgot to document). She will be due for a refill at the end of July.  Racer Quam L. Cem Kosman, PharmD [facility] Clinical Pharmacist Practitioner

## 2022-05-19 ENCOUNTER — Ambulatory Visit: Payer: Medicare Other | Admitting: Internal Medicine

## 2022-05-19 ENCOUNTER — Other Ambulatory Visit: Payer: Self-pay

## 2022-05-19 ENCOUNTER — Encounter: Payer: Self-pay | Admitting: Internal Medicine

## 2022-05-19 DIAGNOSIS — A310 Pulmonary mycobacterial infection: Secondary | ICD-10-CM

## 2022-05-19 NOTE — Assessment & Plan Note (Signed)
She prefers to stay on her current 4 drug regimen for chronic, smoldering Mycobacterium avium pneumonia.  I will have her bring in a sputum specimen for repeat AFB smear and culture.  She will follow-up in 6 weeks.  She will get updated lab work today.

## 2022-05-19 NOTE — Progress Notes (Signed)
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[place]) 10/29/2014    Priority: High   BRONCHIECTASIS 05/23/2009    Priority: Medium    Protein-calorie malnutrition, severe 09/19/2021   SBO (small bowel obstruction) ([place]) 09/18/2021   Depression 06/27/2020   Dysphonia 01/11/2018   Right shoulder pain 01/21/2017   Encounter for hepatitis C screening test for low risk patient 01/21/2017   GERD (gastroesophageal reflux disease) 02/21/2016   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   ACYCLOVIR (ZOVIRAX) 400 MG TABLET    Take 400 mg by mouth 2 (two) times daily.   ALBUTEROL (VENTOLIN HFA) 108 (90 BASE) MCG/ACT INHALER    Inhale 2 puffs into the lungs every 6 (six) hours as needed for wheezing or shortness of breath.   AMIKACIN SULFATE LIPOSOME (ARIKAYCE) 590 MG/8.4ML SUSP    Inhale 590 mg into the lungs daily.   ASCORBIC ACID (VITAMIN C) 500 MG TABLET    Take 500 mg by mouth daily.   ATORVASTATIN (LIPITOR) 20 MG TABLET    Take 20 mg by mouth daily.   B COMPLEX VITAMINS (B COMPLEX-B12 PO)    Take 1 tablet by mouth daily.   BENZONATATE (TESSALON PERLES) 100 MG CAPSULE    Take 1 capsule (100 mg total) by mouth 3 (three) times daily as needed for cough.   CALCIUM CARBONATE (OSCAL) 1500 (600 CA) MG TABS TABLET    Take 600 mg of elemental calcium by mouth 2 (two) times daily with a meal.   CETIRIZINE (ZYRTEC) 10 MG TABLET    Take 10 mg by mouth daily.   CHOLECALCIFEROL (VITAMIN D3) 125 MCG (5000 UT) TABS    Take 1 tablet by mouth daily.    CLOFAZIMINE PO    Take 100 mg by mouth daily.   ETHAMBUTOL (MYAMBUTOL) 400 MG TABLET    Take 2 tablets (800 mg total) by mouth daily.   FLAXSEED, LINSEED, (FLAX SEED OIL) 1300 MG CAPS    Take 1 tablet by mouth daily.   FLUTICASONE-UMECLIDIN-VILANT (TRELEGY ELLIPTA) 100-62.5-25 MCG/INH AEPB    Inhale 1 puff into the lungs daily.   MELATONIN 10 MG CAPS    Take 10 mg by mouth at bedtime.   OMEPRAZOLE (PRILOSEC) 40 MG CAPSULE    Take 1 capsule (40 mg total) by mouth daily.   PROBIOTIC PRODUCT (PROBIOTIC DAILY PO)    Take 1 capsule by mouth daily.   PROPYLENE GLYCOL (SYSTANE BALANCE OP)    Place 1 drop into the left eye daily as needed (dry eyes).   RESPIRATORY THERAPY SUPPLIES (FLUTTER) DEVI    Use as directed.   RIFAMPIN (RIFADIN) 300 MG CAPSULE    Take 2 capsules (600 mg total) by mouth daily.   SPACER/AERO-HOLDING CHAMBERS (AEROCHAMBER MV) INHALER    Use as instructed   TURMERIC PO    Take 2,000 mg by mouth daily.    ZINC GLUCONATE 50 MG TABLET    Take 50 mg by mouth daily.  Modified Medications  No medications on file  Discontinued Medications   No medications on file    Subjective: Kiara Medina is in for her routine follow-up appointment.  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  She was not treated at that time.  She tested positive again in March 2015.  She started on azithromycin, ethambutol and rifampin in December 2015 and completed the course of therapy in November 2016.  She relapsed in June 2018 and started on azithromycin, ethambutol and rifampin again.  She saw Dr. Brigitte Pulse at [facility] in November 2018.  Aerosolized amikacin was added in January 2019  because of evolving resistance.  In September 2019 azithromycin was changed to clofazimine and she continued ethambutol, rifampin and aerosolized amikacin.  She was hospitalized last fall with partial small bowel obstruction.  She was hospitalized for 10 days and did not receive any of her  aerosolized amikacin while hospitalized.  She noted that her chronic cough improved significantly while she was off of it.  Her cough returned when she restarted it after discharge.  She saw me for her hospital follow-up visit in late November at which time we decided to have her stop amikacin.  She has noted a recent increase in dry cough and more shortness of breath.  She had a follow-up chest CT scan on 02/10/2022 which showed:  PRESSION: 1. Redemonstrated, very extensive, diffuse bilateral bronchiectasis, bronchiolar thickening, and plugging, with very extensive clustered centrilobular and tree-in-bud nodularity as well as confluent consolidations and cavitary lesions, substantially increased in comparison to prior examination, particularly with worsened nodularity and cavitation in the right lung. Findings are consistent with severe, significantly worsened MAC infection. 2. Coronary artery disease.    When [name] was in 6 weeks ago we made the decision to have her restart aerosolized amikacin.  She feels like she is tolerating it reasonably well.  She says that her breathing may be a little bit better but she thinks that her cough may be a little bit worse.  She is not having any obvious problems tolerating her oral antibiotics    Review of Systems: Review of Systems  Constitutional:  Positive for malaise/fatigue. Negative for chills, diaphoresis, fever and weight loss.  Eyes:  Negative for blurred vision and double vision.  Respiratory:  Positive for cough, sputum production and shortness of breath. Negative for hemoptysis.   Cardiovascular:  Negative for chest pain.  Gastrointestinal:  Positive for heartburn. Negative for abdominal pain, diarrhea, nausea and vomiting.       There has been no change in her frequent bowel movements.  Skin:  Negative for itching and rash.       Very dry skin recently.   Past Medical History:  Diagnosis Date   Allergic rhinitis, cause unspecified     Bronchiectasis without acute exacerbation ([facility])    Cough    Sinusitis     Social History   Tobacco Use   Smoking status: Former    Packs/day: 1.00    Years: 20.00    Pack years: 20.00    Types: Cigarettes    Quit date: 12/14/1985    Years since quitting: 36.4   Smokeless tobacco: Never  Vaping Use   Vaping Use: Never used  Substance Use Topics   Alcohol use: Not Currently    Alcohol/week: 1.0 standard drink    Types: 1 Glasses of wine per week    Comment: occ   Drug use: No    Family History  Problem Relation Age of Onset   Emphysema Sister    Stroke Mother    Clotting disorder Mother     No Known Allergies  Objective: Vitals:   05/19/22 1528  BP: 111/72  Pulse: (!) 101  Temp: 97.8 F (36.6 C)  TempSrc: Oral  SpO2: 96%  Weight: 103 lb 9.6 oz (47 kg)   Body mass index is 18.35 kg/m.  Physical Exam Constitutional:      Comments: She is in good spirits.  Her weight is unchanged.  Cardiovascular:     Rate and Rhythm: Normal rate and regular rhythm.     Heart sounds: No murmur heard. Pulmonary:     Effort: Pulmonary effort is normal.     Breath sounds: Rales present. No wheezing or rhonchi.     Comments: She has a few crackles in her bases posteriorly. Abdominal:     Palpations: Abdomen is soft.     Tenderness: There is no abdominal tenderness.  Psychiatric:        Mood and Affect: Mood normal.        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  I will have her bring in a sputum specimen for repeat AFB smear and culture.  She will follow-up in 6 weeks.  She will get updated lab work today.       Relevant Orders   MYCOBACTERIA, CULTURE, WITH FLUOROCHROME SMEAR   CBC   Comprehensive metabolic panel  Michel Bickers, MD [facility] for [place]  Group [phone] pager   [phone] cell 05/19/2022, 3:55 PM

## 2022-05-20 DIAGNOSIS — R7301 Impaired fasting glucose: Secondary | ICD-10-CM | POA: Diagnosis not present

## 2022-05-20 DIAGNOSIS — A310 Pulmonary mycobacterial infection: Secondary | ICD-10-CM | POA: Diagnosis not present

## 2022-05-20 DIAGNOSIS — F3341 Major depressive disorder, recurrent, in partial remission: Secondary | ICD-10-CM | POA: Diagnosis not present

## 2022-05-20 DIAGNOSIS — E559 Vitamin D deficiency, unspecified: Secondary | ICD-10-CM | POA: Diagnosis not present

## 2022-05-20 DIAGNOSIS — K219 Gastro-esophageal reflux disease without esophagitis: Secondary | ICD-10-CM | POA: Diagnosis not present

## 2022-05-20 DIAGNOSIS — J479 Bronchiectasis, uncomplicated: Secondary | ICD-10-CM | POA: Diagnosis not present

## 2022-05-20 DIAGNOSIS — E785 Hyperlipidemia, unspecified: Secondary | ICD-10-CM | POA: Diagnosis not present

## 2022-05-20 LAB — CBC
HCT: 39.4 % (ref 35.0–45.0)
Hemoglobin: 12.6 g/dL (ref 11.7–15.5)
MCH: 29 pg (ref 27.0–33.0)
MCHC: 32 g/dL (ref 32.0–36.0)
MCV: 90.6 fL (ref 80.0–100.0)
MPV: 10.4 fL (ref 7.5–12.5)
Platelets: 242 10*3/uL (ref 140–400)
RBC: 4.35 10*6/uL (ref 3.80–5.10)
RDW: 14.3 % (ref 11.0–15.0)
WBC: 7.3 10*3/uL (ref 3.8–10.8)

## 2022-05-20 LAB — COMPREHENSIVE METABOLIC PANEL
AG Ratio: 1.1 (calc) (ref 1.0–2.5)
ALT: 11 U/L (ref 6–29)
AST: 19 U/L (ref 10–35)
Albumin: 3.3 g/dL — ABNORMAL LOW (ref 3.6–5.1)
Alkaline phosphatase (APISO): 71 U/L (ref 37–153)
BUN: 13 mg/dL (ref 7–25)
CO2: 29 mmol/L (ref 20–32)
Calcium: 8.9 mg/dL (ref 8.6–10.4)
Chloride: 102 mmol/L (ref 98–110)
Creat: 0.74 mg/dL (ref 0.60–1.00)
Globulin: 3 g/dL (calc) (ref 1.9–3.7)
Glucose, Bld: 117 mg/dL — ABNORMAL HIGH (ref 65–99)
Potassium: 3.8 mmol/L (ref 3.5–5.3)
Sodium: 138 mmol/L (ref 135–146)
Total Bilirubin: 0.3 mg/dL (ref 0.2–1.2)
Total Protein: 6.3 g/dL (ref 6.1–8.1)

## 2022-06-01 ENCOUNTER — Other Ambulatory Visit: Payer: Self-pay

## 2022-06-01 ENCOUNTER — Other Ambulatory Visit: Payer: Medicare Other

## 2022-06-01 DIAGNOSIS — A310 Pulmonary mycobacterial infection: Secondary | ICD-10-CM

## 2022-06-01 DIAGNOSIS — L720 Epidermal cyst: Secondary | ICD-10-CM | POA: Diagnosis not present

## 2022-06-08 ENCOUNTER — Telehealth: Payer: Self-pay

## 2022-06-08 NOTE — Telephone Encounter (Signed)
Received Mycobacteria culture results for the patient via fax. Please see results in the chart. Kiara Medina T Pricilla Loveless

## 2022-06-10 ENCOUNTER — Telehealth: Payer: Self-pay

## 2022-06-10 NOTE — Telephone Encounter (Signed)
Spoke with patient, notified her that Dr. Jaskaran Dauzat Salon reduced her dose due to her recent weight loss. Patient says she has been taking 3 tablets because she did not notice the change on her bottle, but that she will start taking only two pills from now on.   Beryle Flock, RN

## 2022-06-10 NOTE — Telephone Encounter (Signed)
Patient called, states she received her ethambutol from the pharmacy and directions are to take 2 tablets daily. She reports she has always taken 3 tablets and would like to make sure she received the correct amount. Will route to provider.   Beryle Flock, RN

## 2022-06-30 ENCOUNTER — Other Ambulatory Visit: Payer: Self-pay

## 2022-06-30 ENCOUNTER — Ambulatory Visit: Payer: Medicare Other | Admitting: Internal Medicine

## 2022-06-30 ENCOUNTER — Encounter: Payer: Self-pay | Admitting: Internal Medicine

## 2022-06-30 DIAGNOSIS — A310 Pulmonary mycobacterial infection: Secondary | ICD-10-CM

## 2022-06-30 NOTE — Progress Notes (Signed)
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    Priority: High   BRONCHIECTASIS 05/23/2009    Priority: Medium    Protein-calorie malnutrition, severe 09/19/2021   SBO (small bowel obstruction) ([place]) 09/18/2021   Depression 06/27/2020   Dysphonia 01/11/2018   Right shoulder pain 01/21/2017   Encounter for hepatitis C screening test for low risk patient 01/21/2017   GERD (gastroesophageal reflux disease) 02/21/2016   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   ACYCLOVIR (ZOVIRAX) 400 MG TABLET    Take 400 mg by mouth 2 (two) times daily.   ALBUTEROL (VENTOLIN HFA) 108 (90 BASE) MCG/ACT INHALER    Inhale 2 puffs into the lungs every 6 (six) hours as needed for wheezing or shortness of breath.   AMIKACIN SULFATE LIPOSOME (ARIKAYCE) 590 MG/8.4ML SUSP    Inhale 590 mg into the lungs daily.   AMOXICILLIN-CLAVULANATE (AUGMENTIN) 875-125 MG TABLET    amoxicillin 875 mg-potassium clavulanate 125 mg tablet  TAKE 1 TABLET BY MOUTH TWICE DAILY FOR 14 DAYS   ASCORBIC ACID (VITAMIN C) 500 MG TABLET    Take 500 mg by mouth daily.   ATORVASTATIN (LIPITOR) 20 MG TABLET    Take 20 mg by mouth daily.   B COMPLEX VITAMINS (B COMPLEX-B12 PO)    Take 1 tablet by mouth daily.   BENZONATATE (TESSALON PERLES) 100 MG CAPSULE    Take 1 capsule (100 mg total) by mouth 3 (three) times daily as needed for cough.   CALCIUM CARBONATE (OSCAL) 1500 (600 CA) MG TABS TABLET    Take 600 mg of elemental calcium by mouth 2 (two) times daily with a meal.    CETIRIZINE (ZYRTEC) 10 MG TABLET    Take 10 mg by mouth daily.   CHOLECALCIFEROL (VITAMIN D3) 125 MCG (5000 UT) TABS    Take 1 tablet by mouth daily.   CLOFAZIMINE PO    Take 100 mg by mouth daily.   ETHAMBUTOL (MYAMBUTOL) 400 MG TABLET    Take 2 tablets (800 mg total) by mouth daily.   FLAXSEED, LINSEED, (FLAX SEED OIL) 1300 MG CAPS    Take 1 tablet by mouth daily.   FLUTICASONE-UMECLIDIN-VILANT (TRELEGY ELLIPTA) 100-62.5-25 MCG/INH AEPB    Inhale 1 puff into the lungs daily.   MELATONIN 10 MG CAPS    Take 10 mg by mouth at bedtime.   OMEPRAZOLE (PRILOSEC) 40 MG CAPSULE    Take 1 capsule (40 mg total) by mouth daily.   PROBIOTIC PRODUCT (PROBIOTIC DAILY PO)    Take 1 capsule by mouth daily.   PROPYLENE GLYCOL (SYSTANE BALANCE OP)    Place 1 drop into the left eye daily as needed (dry eyes).   RESPIRATORY THERAPY SUPPLIES (FLUTTER) DEVI    Use as directed.   RIFAMPIN (RIFADIN) 300 MG CAPSULE    Take 2 capsules (600 mg total) by mouth daily.   SPACER/AERO-HOLDING CHAMBERS (AEROCHAMBER MV) INHALER    Use as instructed   TURMERIC PO  Take 2,000 mg by mouth daily.    ZINC GLUCONATE 50 MG TABLET    Take 50 mg by mouth daily.  Modified Medications   No medications on file  Discontinued Medications   No medications on file    Subjective: Kiara Medina is in for her routine follow-up visit.  She continues taking azithromycin, clofazimine and ethambutol for her very chronic, smoldering Mycobacterium avium pneumonia.  She has been on and off therapy since 2015.  She has continued to have occasional severe coughing spasms.  Last week she became more concerned that these might be related to her aerosolized amikacin.  She stopped taking the amikacin and has not had any further severe coughing spells since.  She feels like she is tolerating her oral antibiotics well other than more frequent bowel movements.  She denies having any diarrhea.  She does not have much of an appetite but has not been losing any more  weight.  Over the last several weeks she has had some intermittent episodes of sharp lower abdominal pain.  Initially was she was concerned that her small bowel obstruction might be coming back but the pains have always resolved spontaneously after about 10 to 15 minutes.  Review of Systems: Review of Systems  Constitutional:  Negative for chills, diaphoresis, fever and weight loss.  Respiratory:  Positive for cough, sputum production and shortness of breath. Negative for hemoptysis and wheezing.   Cardiovascular:  Negative for chest pain.  Gastrointestinal:  Positive for abdominal pain. Negative for diarrhea, nausea and vomiting.    Past Medical History:  Diagnosis Date   Allergic rhinitis, cause unspecified    Bronchiectasis without acute exacerbation (HCC)    Cough    Sinusitis     Social History   Tobacco Use   Smoking status: Former    Packs/day: 1.00    Years: 20.00    Total pack years: 20.00    Types: Cigarettes    Quit date: 12/14/1985    Years since quitting: 36.5   Smokeless tobacco: Never  Vaping Use   Vaping Use: Never used  Substance Use Topics   Alcohol use: Not Currently    Alcohol/week: 1.0 standard drink of alcohol    Types: 1 Glasses of wine per week    Comment: occ   Drug use: No    Family History  Problem Relation Age of Onset   Emphysema Sister    Stroke Mother    Clotting disorder Mother     No Known Allergies  Objective: Vitals:   06/30/22 1610  BP: 109/68  Pulse: 80  Resp: 16  Weight: 103 lb (46.7 kg)  Height: '5\' 3"'$  (1.6 m)   Body mass index is 18.25 kg/m.  Physical Exam Constitutional:      Comments: Her spirits are good.  Cardiovascular:     Rate and Rhythm: Normal rate and regular rhythm.     Heart sounds: No murmur heard. Pulmonary:     Effort: Pulmonary effort is normal.     Breath sounds: Rales present. No wheezing or rhonchi.  Psychiatric:        Mood and Affect: Mood normal.     Lab Results Lab Results   Component Value Date   WBC 7.3 05/19/2022   HGB 12.6 05/19/2022   HCT 39.4 05/19/2022   MCV 90.6 05/19/2022   PLT 242 05/19/2022   CMP     Component Value Date/Time   NA 138 05/19/2022 1559   K 3.8 05/19/2022 1559  CL 102 05/19/2022 1559   CO2 29 05/19/2022 1559   GLUCOSE 117 (H) 05/19/2022 1559   BUN 13 05/19/2022 1559   CREATININE 0.74 05/19/2022 1559   CALCIUM 8.9 05/19/2022 1559   PROT 6.3 05/19/2022 1559   ALBUMIN 2.7 (L) 09/19/2021 0536   AST 19 05/19/2022 1559   ALT 11 05/19/2022 1559   ALKPHOS 77 09/19/2021 0536   BILITOT 0.3 05/19/2022 1559   GFRNONAA >60 09/27/2021 0539   GFRAA >60 06/01/2011 1457   CMP     Component Value Date/Time   NA 138 05/19/2022 1559   K 3.8 05/19/2022 1559   CL 102 05/19/2022 1559   CO2 29 05/19/2022 1559   GLUCOSE 117 (H) 05/19/2022 1559   BUN 13 05/19/2022 1559   CREATININE 0.74 05/19/2022 1559   CALCIUM 8.9 05/19/2022 1559   PROT 6.3 05/19/2022 1559   ALBUMIN 2.7 (L) 09/19/2021 0536   AST 19 05/19/2022 1559   ALT 11 05/19/2022 1559   ALKPHOS 77 09/19/2021 0536   BILITOT 0.3 05/19/2022 1559   GFRNONAA >60 09/27/2021 0539   GFRAA >60 06/01/2011 1457   Sputum AFB culture 06/01/2022 positive for Mycobacterium avium   Problem List Items Addressed This Visit       High   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  She will remain off of it for now to see if it improves her most severe coughing spells.  She will follow-up in 1 month.        Michel Bickers, MD [facility] [phone] pager   [phone] cell 06/30/2022, 4:51 PM

## 2022-06-30 NOTE — Assessment & Plan Note (Signed)
She has incurable Mycobacterium avium pneumonia that is slowly progressing she may be getting some benefit from her antibiotic regimen but she has ongoing concerns about difficulty tolerating her aerosolized amikacin.  She will remain off of it for now to see if it improves her most severe coughing spells.  She will follow-up in 1 month.

## 2022-07-04 DIAGNOSIS — Z8619 Personal history of other infectious and parasitic diseases: Secondary | ICD-10-CM | POA: Diagnosis not present

## 2022-07-04 DIAGNOSIS — K635 Polyp of colon: Secondary | ICD-10-CM | POA: Diagnosis not present

## 2022-07-04 DIAGNOSIS — K56609 Unspecified intestinal obstruction, unspecified as to partial versus complete obstruction: Secondary | ICD-10-CM | POA: Diagnosis not present

## 2022-07-14 LAB — MYCOBACTERIA,CULT W/FLUOROCHROME SMEAR
MICRO NUMBER:: 13546133
SPECIMEN QUALITY:: ADEQUATE

## 2022-07-20 ENCOUNTER — Telehealth: Payer: Self-pay | Admitting: Pharmacist

## 2022-07-20 ENCOUNTER — Other Ambulatory Visit: Payer: Self-pay | Admitting: Pharmacist

## 2022-07-20 NOTE — Telephone Encounter (Signed)
2 bottles of clofazimine are located in the pharmacy office when patient needs a refill.   Isair Inabinet L. Stefanos Haynesworth, PharmD, BCIDP, AAHIVP, CPP Clinical Pharmacist Practitioner Infectious Diseases [facility] 07/20/2022, 12:09 PM

## 2022-07-23 ENCOUNTER — Encounter: Payer: Self-pay | Admitting: Pulmonary Disease

## 2022-07-23 ENCOUNTER — Ambulatory Visit: Payer: Medicare Other | Admitting: Pulmonary Disease

## 2022-07-23 VITALS — BP 112/62 | HR 90 | Ht 63.0 in | Wt 102.8 lb

## 2022-07-23 DIAGNOSIS — J479 Bronchiectasis, uncomplicated: Secondary | ICD-10-CM

## 2022-07-23 DIAGNOSIS — R053 Chronic cough: Secondary | ICD-10-CM | POA: Diagnosis not present

## 2022-07-23 DIAGNOSIS — A310 Pulmonary mycobacterial infection: Secondary | ICD-10-CM

## 2022-07-23 MED ORDER — STIOLTO RESPIMAT 2.5-2.5 MCG/ACT IN AERS: 2.0000 | INHALATION_SPRAY | Freq: Every day | RESPIRATORY_TRACT | 0 refills | Status: DC

## 2022-07-23 MED ORDER — BENZONATATE 100 MG PO CAPS: 100.0000 mg | ORAL_CAPSULE | Freq: Three times a day (TID) | ORAL | 3 refills | Status: DC | PRN

## 2022-07-23 NOTE — Patient Instructions (Addendum)
Your DME Company is: [employer] (medical equipment and supplies)   Nice to see you again  I will send a prescription for Stiolto, use 2 puffs once a day for a month.  If helpful let me know I will send it to [facility]  We will refill the Tessalon.  Return to clinic in 3 months or sooner as needed with Dr. Silas Flood

## 2022-07-23 NOTE — Progress Notes (Signed)
Synopsis: Referred in 2010 for bronchiectasis by Nicoletta Dress, MD.  Previously patient of Dr. Normajean Baxter and Dr. Lake Bells and Dr. Carlis Abbott.  Subjective:   PATIENT ID: Kiara Medina GENDER: female DOB: 23-Aug-1946, MRN: [id number]  Chief Complaint  Patient presents with   Follow-up    Pt is here for follow up. Pt states when the weather is bad she does have SOB. Pt states the her cough is not as bad as it was in the past. Still no appetite noted from patient. Pt is on Albuterol as needed. She wants to discuss the albuterol with MH. Pt has stopped the Arikayce.    Kiara Medina is a 76 y.o. woman with a history of MDR MAC and bronchiectasis who presents for follow up.   Overall doing fine.  Cough is markedly improved while off amikacin.  Stop last month.  Using albuterol some.  Somewhat beneficial.  Curious about trying different inhalers.  Discussed dual bronchodilator therapy.  Discussed she is tried multiple things in the past with perceived mixed benefit.  Past Medical History:  Diagnosis Date   Allergic rhinitis, cause unspecified    Bronchiectasis without acute exacerbation (HCC)    Cough    Sinusitis      Family History  Problem Relation Age of Onset   Emphysema Sister    Stroke Mother    Clotting disorder Mother      Past Surgical History:  Procedure Laterality Date   APPENDECTOMY     CATARACT EXTRACTION  08/2013   CHOLECYSTECTOMY     TUBAL LIGATION     VESICOVAGINAL FISTULA CLOSURE W/ TAH      Social History   Socioeconomic History   Marital status: Widowed    Spouse name: Not on file   Number of children: Not on file   Years of education: Not on file   Highest education level: Not on file  Occupational History   Occupation: realtor  Tobacco Use   Smoking status: Former    Packs/day: 1.00    Years: 20.00    Total pack years: 20.00    Types: Cigarettes    Quit date: 12/14/1985    Years since quitting: 36.6   Smokeless tobacco: Never  Vaping Use   Vaping Use:  Never used  Substance and Sexual Activity   Alcohol use: Not Currently    Alcohol/week: 1.0 standard drink of alcohol    Types: 1 Glasses of wine per week    Comment: occ   Drug use: No   Sexual activity: Not on file  Other Topics Concern   Not on file  Social History Narrative   Not on file   Social Determinants of Health   Financial Resource Strain: Not on file  Food Insecurity: Not on file  Transportation Needs: Not on file  Physical Activity: Not on file  Stress: Not on file  Social Connections: Not on file  Intimate Partner Violence: Not on file     No Known Allergies   Immunization History  Administered Date(s) Administered   Fluad Quad(high Dose 65+) 09/24/2020   Influenza Split 09/13/2012, 09/06/2014   Influenza Whole 09/09/2009, 08/14/2010, 09/14/2011   Influenza, High Dose Seasonal PF 10/10/2015, 09/02/2021   Influenza,inj,Quad PF,6+ Mos 09/13/2013, 09/21/2016, 08/26/2017, 08/22/2018   Influenza-Unspecified 08/29/2019   Moderna Sars-Covid-2 Vaccination 01/03/2020, 01/31/2020, 08/02/2020, 05/06/2021   Pfizer Covid-19 Vaccine Bivalent Booster 5y-11y 11/24/2021   Pneumococcal Conjugate-13 11/24/2014   Pneumococcal Polysaccharide-23 07/17/2009    Outpatient Medications Prior to Visit  Medication Sig Dispense Refill   acetaminophen (TYLENOL) 500 MG tablet Take 1,000 mg by mouth every 6 (six) hours as needed for moderate pain.     acyclovir (ZOVIRAX) 400 MG tablet Take 400 mg by mouth 2 (two) times daily.     albuterol (VENTOLIN HFA) 108 (90 Base) MCG/ACT inhaler Inhale 2 puffs into the lungs every 6 (six) hours as needed for wheezing or shortness of breath.     ascorbic acid (VITAMIN C) 500 MG tablet Take 500 mg by mouth daily.     atorvastatin (LIPITOR) 20 MG tablet Take 20 mg by mouth daily.     B Complex Vitamins (B COMPLEX-B12 PO) Take 1 tablet by mouth daily.     calcium carbonate (OSCAL) 1500 (600 Ca) MG TABS tablet Take 600 mg of elemental calcium by mouth  2 (two) times daily with a meal.     cetirizine (ZYRTEC) 10 MG tablet Take 10 mg by mouth daily.     Cholecalciferol (VITAMIN D3) 125 MCG (5000 UT) TABS Take 1 tablet by mouth daily.     CLOFAZIMINE PO Take 100 mg by mouth daily.     ethambutol (MYAMBUTOL) 400 MG tablet Take 2 tablets (800 mg total) by mouth daily. 180 tablet 3   Flaxseed, Linseed, (FLAX SEED OIL) 1300 MG CAPS Take 1 tablet by mouth daily.     Melatonin 10 MG CAPS Take 10 mg by mouth at bedtime.     omeprazole (PRILOSEC) 40 MG capsule Take 1 capsule (40 mg total) by mouth daily. 90 capsule 3   Probiotic Product (PROBIOTIC DAILY PO) Take 1 capsule by mouth daily.     Propylene Glycol (SYSTANE BALANCE OP) Place 1 drop into the left eye daily as needed (dry eyes).     Respiratory Therapy Supplies (FLUTTER) DEVI Use as directed. 1 each 0   rifampin (RIFADIN) 300 MG capsule Take 2 capsules (600 mg total) by mouth daily. 180 capsule 3   Spacer/Aero-Holding Chambers (AEROCHAMBER MV) inhaler Use as instructed 1 each 2   TURMERIC PO Take 2,000 mg by mouth daily.      zinc gluconate 50 MG tablet Take 50 mg by mouth daily.     benzonatate (TESSALON PERLES) 100 MG capsule Take 1 capsule (100 mg total) by mouth 3 (three) times daily as needed for cough. 90 capsule 5   Amikacin Sulfate Liposome (ARIKAYCE) 590 MG/8.4ML SUSP Inhale 590 mg into the lungs daily. 252 mL 11   amoxicillin-clavulanate (AUGMENTIN) 875-125 MG tablet amoxicillin 875 mg-potassium clavulanate 125 mg tablet  TAKE 1 TABLET BY MOUTH TWICE DAILY FOR 14 DAYS     Fluticasone-Umeclidin-Vilant (TRELEGY ELLIPTA) 100-62.5-25 MCG/INH AEPB Inhale 1 puff into the lungs daily. 60 each 5   No facility-administered medications prior to visit.    Review of Systems  No chest pain.  No hemoptysis.  No worsening sputum production or change in quality of sputum.   Objective:   Vitals:   07/23/22 1431  BP: 112/62  Pulse: 90  SpO2: 99%  Weight: 102 lb 12.8 oz (46.6 kg)  Height:  _0  (1.6 m)     99% on   RA BMI Readings from Last 3 Encounters:  07/23/22 18.21 kg/m  06/30/22 18.25 kg/m  05/19/22 18.35 kg/m   Wt Readings from Last 3 Encounters:  07/23/22 102 lb 12.8 oz (46.6 kg)  06/30/22 103 lb (46.7 kg)  05/19/22 103 lb 9.6 oz (47 kg)    Physical Exam BP 112/62 (BP Location:  Left Arm, Patient Position: Sitting, Cuff Size: Normal)   Pulse 90   Ht _0  (1.6 m)   Wt 102 lb 12.8 oz (46.6 kg)   SpO2 99%   BMI 18.21 kg/m  General: Well-appearing in no acute distress, thin Eyes: EOMI, icterus Respiratory: Completely clear, normal work of breathing Cardiovascular: Regular rhythm, no murmur Extremities: Warm, no edema     CBC    Component Value Date/Time   WBC 7.3 05/19/2022 1559   RBC 4.35 05/19/2022 1559   HGB 12.6 05/19/2022 1559   HCT 39.4 05/19/2022 1559   PLT 242 05/19/2022 1559   MCV 90.6 05/19/2022 1559   MCH 29.0 05/19/2022 1559   MCHC 32.0 05/19/2022 1559   RDW 14.3 05/19/2022 1559   LYMPHSABS 1.3 09/24/2021 0519   MONOABS 0.5 09/24/2021 0519   EOSABS 0.2 09/24/2021 0519   BASOSABS 0.0 09/24/2021 0519    CHEMISTRY No results for input(s): "NA", "K", "CL", "CO2", "GLUCOSE", "BUN", "CREATININE", "CALCIUM", "MG", "PHOS" in the last 168 hours. CrCl cannot be calculated (Patient's most recent lab result is older than the maximum 21 days allowed.).  12/28/2019: IgG 954 IgM 96 IgA 289 IgE 10 IgG subclasses normal (other than increased IgG subclass 3)  Cultures: 3/10322 - pending 11/23/2019-MAI 05/17/2019-MAI 03/15/2019-MAI 01/10/2019 MAI 11/07/2018-MAI 10/10/2018-MAI  08/22/2018-MAI 07/11/2018-MAI 05/31/2018-MAI MAI susceptibilities-resistant clarithromycin, linezolid.  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  Susceptible to  amikacin, cefepime, clarithromycin, imipenem, linezolid, trimethoprim sulfamethoxazole. 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  Tree-in-bud opacities diffusely.  Mucus impaction in airways.   Pulmonary Functions Testing Results:    Latest Ref Rng & Units 04/18/2018    2:40 PM  PFT Results  FVC-Pre L 2.08   FVC-Predicted Pre % 79   Pre FEV1/FVC % % 75   FEV1-Pre L 1.57   FEV1-Predicted Pre % 80    2019-no significant obstruction, mildly reduced FVC.     Assessment & Plan:     ICD-10-CM   1. BRONCHIECTASIS  J47.9     2. 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 Bronchiectasis without acute exacerbation (HCC)  J47.9 benzonatate (TESSALON PERLES) 100 MG capsule    4. Cough  R05.9 benzonatate (TESSALON PERLES) 100 MG capsule         Dyspnea on exertion likely due to chronic bronchiectasis-improved on LAMA/LABA; worsed when back off these meds. Component of deconditioning.  Has seasonal allergies, describes bronchospasm, possible asthma. 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  No obvious immunodeficiencies.  Progressive on CT over the last 6 years.  No evidence of hypersensitivity pneumonitis from inhaled amikacin on CT scan.  With ongoing worsening cough and poor appetite.  Concerning for exacerbation. - Hypertonic saline to twice a day during exacerbation   (daily currently) -Continue current multi drug MAI therapy and ID follow-up.  I agree with Dr. Megan Salon that she has limited treatment options and would likely progress  faster off meds --stopped inhaled amikacin once again 06/2022 due to worsening cough -CT chest hi res 01/2022 with worsned signs of NTM disease  Weight loss- concerned this may be a MAC secondary effect, complicated by fatigue and DOE.  Related to metabolic needs from dyspnea, cough.  Poor appetite.  This is ongoing. Weight overall stable.  RTC in 3 months with Dr. Silas Flood.     Current Outpatient Medications:    acetaminophen (TYLENOL) 500 MG tablet, Take 1,000 mg by mouth every 6 (six) hours as needed for moderate pain., Disp: , Rfl:    acyclovir (ZOVIRAX) 400 MG tablet, Take 400 mg by mouth 2 (two) times daily., Disp: , Rfl:    albuterol (VENTOLIN HFA) 108 (90 Base) MCG/ACT inhaler, Inhale 2 puffs into the lungs every 6 (six) hours as needed for wheezing or shortness of breath., Disp: , Rfl:    ascorbic acid (VITAMIN C) 500 MG tablet, Take 500 mg by mouth daily., Disp: , Rfl:    atorvastatin (LIPITOR) 20 MG tablet, Take 20 mg by mouth daily., Disp: , Rfl:    B Complex Vitamins (B COMPLEX-B12 PO), Take 1 tablet by mouth daily., Disp: , Rfl:    calcium carbonate (OSCAL) 1500 (600 Ca) MG TABS tablet, Take 600 mg of elemental calcium by mouth 2 (two) times daily with a meal., Disp: , Rfl:    cetirizine (ZYRTEC) 10 MG tablet, Take 10 mg by mouth daily., Disp: , Rfl:    Cholecalciferol (VITAMIN D3) 125 MCG (5000 UT) TABS, Take 1 tablet by mouth daily., Disp: , Rfl:    CLOFAZIMINE PO, Take 100 mg by mouth daily., Disp: , Rfl:    ethambutol (MYAMBUTOL) 400 MG tablet, Take 2 tablets (800 mg total) by mouth daily., Disp: 180 tablet, Rfl: 3   Flaxseed, Linseed, (FLAX SEED OIL) 1300 MG CAPS, Take 1 tablet by mouth daily., Disp: , Rfl:    Melatonin 10 MG CAPS, Take 10 mg by mouth at bedtime., Disp: , Rfl:    omeprazole (PRILOSEC) 40 MG  capsule, Take 1 capsule (40 mg total) by mouth daily., Disp: 90 capsule, Rfl: 3   Probiotic Product (PROBIOTIC DAILY PO), Take 1 capsule by mouth daily., Disp: , Rfl:    Propylene Glycol (SYSTANE BALANCE OP), Place 1 drop into the left eye daily as needed (dry eyes)., Disp: , Rfl:    Respiratory Therapy Supplies (FLUTTER) DEVI, Use as directed., Disp: 1 each, Rfl: 0   rifampin (RIFADIN) 300 MG capsule, Take 2 capsules (600 mg total) by mouth daily., Disp: 180 capsule, Rfl: 3   Spacer/Aero-Holding Chambers (AEROCHAMBER MV) inhaler, Use as instructed, Disp: 1 each, Rfl: 2   Tiotropium Bromide-Olodaterol (STIOLTO RESPIMAT) 2.5-2.5 MCG/ACT AERS, Inhale 2 puffs into the lungs daily., Disp: 1 each, Rfl: 0   TURMERIC PO, Take 2,000 mg by mouth daily. , Disp: , Rfl:    zinc gluconate 50 MG tablet, Take 50 mg by mouth daily., Disp: , Rfl:    benzonatate (TESSALON PERLES) 100 MG capsule, Take 1 capsule (100 mg total) by mouth 3 (three) times daily as needed for cough., Disp: 270 capsule, Rfl: 3

## 2022-07-24 ENCOUNTER — Telehealth: Payer: Self-pay | Admitting: Pulmonary Disease

## 2022-07-24 DIAGNOSIS — J479 Bronchiectasis, uncomplicated: Secondary | ICD-10-CM

## 2022-07-24 DIAGNOSIS — R053 Chronic cough: Secondary | ICD-10-CM

## 2022-07-24 DIAGNOSIS — A310 Pulmonary mycobacterial infection: Secondary | ICD-10-CM

## 2022-07-24 MED ORDER — BENZONATATE 100 MG PO CAPS: 100.0000 mg | ORAL_CAPSULE | Freq: Three times a day (TID) | ORAL | 3 refills | Status: DC | PRN

## 2022-07-24 NOTE — Telephone Encounter (Signed)
Called and spoke with patient. Patient stated that her insurance doesn't cover the benzonatate. Patient wants to know if there is an alternative or if the prescription can be sent to the [facility] pharmacy in [place].   MH, please advise.

## 2022-07-24 NOTE — Telephone Encounter (Signed)
Called and spoke with patient. She verbalized understanding. patient stated that she would like the prescription sent to [facility] in [place]. Rx has been sent.  Nothing further needed.

## 2022-07-24 NOTE — Telephone Encounter (Signed)
I am not aware of a direct alternative. [facility] can [facility] [place] and see if GoodRx coupon exists. Other option would be to take Delsym every 12 hours as needed.

## 2022-07-28 ENCOUNTER — Telehealth (INDEPENDENT_AMBULATORY_CARE_PROVIDER_SITE_OTHER): Payer: Medicare Other | Admitting: Internal Medicine

## 2022-07-28 ENCOUNTER — Encounter: Payer: Self-pay | Admitting: Internal Medicine

## 2022-07-28 ENCOUNTER — Other Ambulatory Visit: Payer: Self-pay

## 2022-07-28 DIAGNOSIS — A310 Pulmonary mycobacterial infection: Secondary | ICD-10-CM

## 2022-07-28 NOTE — Progress Notes (Signed)
Virtual Visit via Telephone Note  I connected with Kiara Medina on 07/28/22 at  2:15 PM EDT by telephone and verified that I am speaking with the correct person using two identifiers.  Location: Patient: Home Provider: [facility]   I discussed the limitations, risks, security and privacy concerns of performing an evaluation and management service by telephone and the availability of in person appointments. I also discussed with the patient that there may be a patient responsible charge related to this service. The patient expressed understanding and agreed to proceed.   History of Present Illness: I called and spoke with Kiara Medina today.  After her last visit here 1 month ago she stopped her aerosolized amikacin.  Her bad coughing spells got better almost immediately.  This occurred before she was started on a new inhaler, Stiolto, on 07/23/2022.  She is continuing to take clofazimine, ethambutol and rifampin without difficulty.  Observations/Objective:   Assessment and Plan: She has now stopped aerosolized amikacin twice in the past year, both times starting to feel better.  She will continue clofazimine, ethambutol and rifampin alone for now.  Follow Up Instructions: Continue current antibiotics Follow-up here in 3 months   I discussed the assessment and treatment plan with the patient. The patient was provided an opportunity to ask questions and all were answered. The patient agreed with the plan and demonstrated an understanding of the instructions.   The patient was advised to call back or seek an in-person evaluation if the symptoms worsen or if the condition fails to improve as anticipated.  I provided 14 minutes of non-face-to-face time during this encounter.   Michel Bickers, MD

## 2022-08-18 DIAGNOSIS — Z1231 Encounter for screening mammogram for malignant neoplasm of breast: Secondary | ICD-10-CM | POA: Diagnosis not present

## 2022-08-18 DIAGNOSIS — Z01419 Encounter for gynecological examination (general) (routine) without abnormal findings: Secondary | ICD-10-CM | POA: Diagnosis not present

## 2022-08-20 DIAGNOSIS — K219 Gastro-esophageal reflux disease without esophagitis: Secondary | ICD-10-CM | POA: Diagnosis not present

## 2022-08-20 DIAGNOSIS — E559 Vitamin D deficiency, unspecified: Secondary | ICD-10-CM | POA: Diagnosis not present

## 2022-08-20 DIAGNOSIS — E785 Hyperlipidemia, unspecified: Secondary | ICD-10-CM | POA: Diagnosis not present

## 2022-08-20 DIAGNOSIS — R7301 Impaired fasting glucose: Secondary | ICD-10-CM | POA: Diagnosis not present

## 2022-08-25 ENCOUNTER — Telehealth: Payer: Self-pay | Admitting: Pharmacist

## 2022-08-25 NOTE — Telephone Encounter (Signed)
Patient picked up 2 bottles of clofazimine on 08/19/22. Supply should last for ~3 months. Next refill due around the first of December.  Charene Mccallister L. Joseluis Alessio, PharmD, BCIDP, AAHIVP, CPP Clinical Pharmacist Practitioner Infectious Diseases [facility] 08/25/2022, 9:55 AM

## 2022-09-02 DIAGNOSIS — B0052 Herpesviral keratitis: Secondary | ICD-10-CM | POA: Diagnosis not present

## 2022-09-02 DIAGNOSIS — Z961 Presence of intraocular lens: Secondary | ICD-10-CM | POA: Diagnosis not present

## 2022-09-02 DIAGNOSIS — H02403 Unspecified ptosis of bilateral eyelids: Secondary | ICD-10-CM | POA: Diagnosis not present

## 2022-10-02 DIAGNOSIS — J471 Bronchiectasis with (acute) exacerbation: Secondary | ICD-10-CM | POA: Diagnosis not present

## 2022-10-05 ENCOUNTER — Telehealth: Payer: Self-pay | Admitting: Pulmonary Disease

## 2022-10-05 MED ORDER — ALBUTEROL SULFATE HFA 108 (90 BASE) MCG/ACT IN AERS: 2.0000 | INHALATION_SPRAY | Freq: Four times a day (QID) | RESPIRATORY_TRACT | 3 refills | Status: DC | PRN

## 2022-10-05 NOTE — Telephone Encounter (Signed)
Called patient and she states that she is needing her rescue inhaler script sent to her. She states she needs to take it to different pharmacies that will take the rx and her good rx coupon. Rx for Ventolin printed out as requested. Nothing further needed

## 2022-10-12 ENCOUNTER — Telehealth: Payer: Self-pay | Admitting: Pulmonary Disease

## 2022-10-12 NOTE — Telephone Encounter (Signed)
Spoke with the pt  She has had increased cough for over a wk  She is on pred per PCP and not improving  Appt with Dr Silas Flood tomorrow at 9:15 am

## 2022-10-13 ENCOUNTER — Ambulatory Visit: Payer: Medicare Other | Admitting: Pulmonary Disease

## 2022-10-13 ENCOUNTER — Encounter: Payer: Self-pay | Admitting: Pulmonary Disease

## 2022-10-13 VITALS — BP 128/64 | HR 105 | Wt 103.6 lb

## 2022-10-13 DIAGNOSIS — J479 Bronchiectasis, uncomplicated: Secondary | ICD-10-CM

## 2022-10-13 MED ORDER — CIPROFLOXACIN HCL 500 MG PO TABS: 500.0000 mg | ORAL_TABLET | Freq: Two times a day (BID) | ORAL | 0 refills | Status: AC

## 2022-10-13 NOTE — Progress Notes (Signed)
Synopsis: Referred in 2010 for bronchiectasis by Kiara Dress, MD.  Previously patient of Dr. Normajean Medina and Dr. [place] Medina and Dr. Carlis Medina.  Subjective:   PATIENT ID: Kiara Medina GENDER: female DOB: [birth year], MRN: [id number]  Chief Complaint  Patient presents with   Acute Visit    Pt is here for acute visit. She states she was dx with bronchitis last week. Almost done with medication. Pt she states she is still feeling bad. Pt states she is just feeling SOB. Pt was on amoxicillin 880m and prednisone taper pack. Covid/ flu was neg.    Kiara Medina a 76y.o. woman with a history of MDR MAC and bronchiectasis who presents for acute visit with worsening cough.  Recent infectious disease note reviewed.  Have been doing well since last visit.  Unfortunately last week or 2 developed "bronchitis."  Worsening productive cough.  Worsening shortness of breath.  Seen by PCP.  Given 10 days of prednisone taper as well as amoxicillin per her report.  This has helped improve her symptoms.  Just not back to baseline.  Cough still worse than baseline.  Dyspnea on exertion worse than baseline.  She is having trouble doing hypertonic saline given lack of supply.  Past Medical History:  Diagnosis Date   Allergic rhinitis, cause unspecified    Bronchiectasis without acute exacerbation (HCC)    Cough    Sinusitis      Family History  Problem Relation Age of Onset   Emphysema Sister    Stroke Mother    Clotting disorder Mother      Past Surgical History:  Procedure Laterality Date   APPENDECTOMY     CATARACT EXTRACTION  08/2013   CHOLECYSTECTOMY     TUBAL LIGATION     VESICOVAGINAL FISTULA CLOSURE W/ TAH      Social History   Socioeconomic History   Marital status: Widowed    Spouse name: Not on file   Number of children: Not on file   Years of education: Not on file   Highest education level: Not on file  Occupational History   Occupation: realtor  Tobacco Use   Smoking status: Former     Packs/day: 1.00    Years: 20.00    Total pack years: 20.00    Types: Cigarettes    Quit date: 12/14/1985    Years since quitting: 36.8   Smokeless tobacco: Never  Vaping Use   Vaping Use: Never used  Substance and Sexual Activity   Alcohol use: Not Currently    Alcohol/week: 1.0 standard drink of alcohol    Types: 1 Glasses of wine per week    Comment: occ   Drug use: No   Sexual activity: Not on file  Other Topics Concern   Not on file  Social History Narrative   Not on file   Social Determinants of Health   Financial Resource Strain: Not on file  Food Insecurity: Not on file  Transportation Needs: Not on file  Physical Activity: Not on file  Stress: Not on file  Social Connections: Not on file  Intimate Partner Violence: Not on file     No Known Allergies   Immunization History  Administered Date(s) Administered   Fluad Quad(high Dose 65+) 09/24/2020   Influenza Split 09/13/2012, 09/06/2014   Influenza Whole 09/09/2009, 08/14/2010, 09/14/2011   Influenza, High Dose Seasonal PF 10/10/2015, 09/02/2021   Influenza,inj,Quad PF,6+ Mos 09/13/2013, 09/21/2016, 08/26/2017, 08/22/2018   Influenza-Unspecified 08/29/2019   Moderna Sars-Covid-2  Vaccination 01/03/2020, 01/31/2020, 08/02/2020, 05/06/2021   Pfizer Covid-19 Vaccine Bivalent Booster 5y-11y 11/24/2021   Pneumococcal Conjugate-13 11/24/2014   Pneumococcal Polysaccharide-23 07/17/2009    Outpatient Medications Prior to Visit  Medication Sig Dispense Refill   acetaminophen (TYLENOL) 500 MG tablet Take 1,000 mg by mouth every 6 (six) hours as needed for moderate pain.     acyclovir (ZOVIRAX) 400 MG tablet Take 400 mg by mouth 2 (two) times daily.     albuterol (VENTOLIN HFA) 108 (90 Base) MCG/ACT inhaler Inhale 2 puffs into the lungs every 6 (six) hours as needed for wheezing or shortness of breath. 18 g 3   ascorbic acid (VITAMIN C) 500 MG tablet Take 500 mg by mouth daily.     atorvastatin (LIPITOR) 20 MG tablet  Take 20 mg by mouth daily.     B Complex Vitamins (B COMPLEX-B12 PO) Take 1 tablet by mouth daily.     benzonatate (TESSALON PERLES) 100 MG capsule Take 1 capsule (100 mg total) by mouth 3 (three) times daily as needed for cough. 270 capsule 3   calcium carbonate (OSCAL) 1500 (600 Ca) MG TABS tablet Take 600 mg of elemental calcium by mouth 2 (two) times daily with a meal.     cetirizine (ZYRTEC) 10 MG tablet Take 10 mg by mouth daily.     Cholecalciferol (VITAMIN D3) 125 MCG (5000 UT) TABS Take 1 tablet by mouth daily.     CLOFAZIMINE PO Take 100 mg by mouth daily.     ethambutol (MYAMBUTOL) 400 MG tablet Take 2 tablets (800 mg total) by mouth daily. 180 tablet 3   Flaxseed, Linseed, (FLAX SEED OIL) 1300 MG CAPS Take 1 tablet by mouth daily.     Melatonin 10 MG CAPS Take 10 mg by mouth at bedtime.     omeprazole (PRILOSEC) 40 MG capsule Take 1 capsule (40 mg total) by mouth daily. 90 capsule 3   Probiotic Product (PROBIOTIC DAILY PO) Take 1 capsule by mouth daily.     Propylene Glycol (SYSTANE BALANCE OP) Place 1 drop into the left eye daily as needed (dry eyes).     Respiratory Therapy Supplies (FLUTTER) DEVI Use as directed. 1 each 0   rifampin (RIFADIN) 300 MG capsule Take 2 capsules (600 mg total) by mouth daily. 180 capsule 3   Spacer/Aero-Holding Chambers (AEROCHAMBER MV) inhaler Use as instructed 1 each 2   TURMERIC PO Take 2,000 mg by mouth daily.      zinc gluconate 50 MG tablet Take 50 mg by mouth daily.     Tiotropium Bromide-Olodaterol (STIOLTO RESPIMAT) 2.5-2.5 MCG/ACT AERS Inhale 2 puffs into the lungs daily. 1 each 0   No facility-administered medications prior to visit.    Review of Systems  N/a   Objective:   Vitals:   10/13/22 0924  BP: 128/64  Pulse: (!) 105  SpO2: 97%  Weight: 103 lb 9.6 oz (47 kg)      97% on   RA BMI Readings from Last 3 Encounters:  10/13/22 18.35 kg/m  07/23/22 18.21 kg/m  06/30/22 18.25 kg/m   Wt Readings from Last 3  Encounters:  10/13/22 103 lb 9.6 oz (47 kg)  07/23/22 102 lb 12.8 oz (46.6 kg)  06/30/22 103 lb (46.7 kg)    Physical Exam BP 128/64 (BP Location: Left Arm, Patient Position: Sitting, Cuff Size: Normal)   Pulse (!) 105   Wt 103 lb 9.6 oz (47 kg)   SpO2 97%   BMI 18.35 kg/m  General: Well-appearing in no acute distress, thin Eyes: EOMI, icterus Respiratory: Clear, no wheeze, normal work of breathing Cardiovascular: Regular rhythm, no murmur Extremities: Warm, no edema     CBC    Component Value Date/Time   WBC 7.3 05/19/2022 1559   RBC 4.35 05/19/2022 1559   HGB 12.6 05/19/2022 1559   HCT 39.4 05/19/2022 1559   PLT 242 05/19/2022 1559   MCV 90.6 05/19/2022 1559   MCH 29.0 05/19/2022 1559   MCHC 32.0 05/19/2022 1559   RDW 14.3 05/19/2022 1559   LYMPHSABS 1.3 09/24/2021 0519   MONOABS 0.5 09/24/2021 0519   EOSABS 0.2 09/24/2021 0519   BASOSABS 0.0 09/24/2021 0519    CHEMISTRY No results for input(s): "NA", "K", "CL", "CO2", "GLUCOSE", "BUN", "CREATININE", "CALCIUM", "MG", "PHOS" in the last 168 hours. CrCl cannot be calculated (Patient's most recent lab result is older than the maximum 21 days allowed.).  12/28/2019: IgG 954 IgM 96 IgA 289 IgE 10 IgG subclasses normal (other than increased IgG subclass 3)  Cultures: 3/10322 - pending 11/23/2019-MAI 05/17/2019-MAI 03/15/2019-MAI 01/10/2019 MAI 11/07/2018-MAI 10/10/2018-MAI  08/22/2018-MAI 07/11/2018-MAI 05/31/2018-MAI MAI susceptibilities-resistant clarithromycin, linezolid.  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  Susceptible to amikacin, cefepime, clarithromycin, imipenem, linezolid, trimethoprim sulfamethoxazole. 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  Tree-in-bud opacities diffusely.  Mucus impaction in airways.  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     Assessment & Plan:     ICD-10-CM   1. BRONCHIECTASIS  J47.9 Chest physiotherapy          Dyspnea on exertion likely due to chronic bronchiectasis-improved on LAMA/LABA; worsed when back off these meds. Component of deconditioning.  Has seasonal allergies, describes bronchospasm, possible asthma. 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  No obvious immunodeficiencies.  Progressive on CT over the last 6 years.  No evidence of hypersensitivity pneumonitis from inhaled amikacin on CT scan.  With worsening cough concerning for exacerbation. --s/p 10 days Amoxicillin and prednisone, ciprofloxacin 500 mg BID x 10 days - Hypertonic saline to twice a day  --New order for vest therapy today -Continue current multi drug MAI therapy and ID follow-up.  I agree with Dr. Megan Salon that she has limited treatment options and would likely progress faster off meds --stopped inhaled amikacin once again 06/2022 due to worsening cough -CT chest hi res 02/2022 with worsned signs of NTM disease  Weight  loss- concerned this may be a MAC secondary effect, complicated by fatigue and DOE.  Related to metabolic needs from dyspnea, cough.  Poor appetite.  This is ongoing. Weight overall stable.  RTC  in 3 months with Dr. Silas Flood.   I spent 42 minutes in the care of the patient including face-to-face visit, review of records, coordination of care  Current Outpatient Medications:    acetaminophen (TYLENOL) 500 MG tablet, Take 1,000 mg by mouth every 6 (six) hours as needed for moderate pain., Disp: , Rfl:    acyclovir (ZOVIRAX) 400 MG tablet, Take 400 mg by mouth 2 (two) times daily., Disp: , Rfl:    albuterol (VENTOLIN HFA) 108 (90 Base) MCG/ACT inhaler, Inhale 2 puffs into the lungs every 6 (six) hours as needed for wheezing or shortness of breath., Disp: 18 g, Rfl: 3   ascorbic acid (VITAMIN C) 500 MG tablet, Take 500 mg by mouth daily., Disp: , Rfl:    atorvastatin (LIPITOR) 20 MG tablet, Take 20 mg by mouth daily., Disp: , Rfl:    B Complex Vitamins (B COMPLEX-B12 PO), Take 1 tablet by mouth daily., Disp: , Rfl:    benzonatate (TESSALON PERLES) 100 MG capsule, Take 1 capsule (100 mg total) by mouth 3 (three) times daily as needed for cough., Disp: 270 capsule, Rfl: 3   calcium carbonate (OSCAL) 1500 (600 Ca) MG TABS tablet, Take 600 mg of elemental calcium by mouth 2 (two) times daily with a meal., Disp: , Rfl:    cetirizine (ZYRTEC) 10 MG tablet, Take 10 mg by mouth daily., Disp: , Rfl:    Cholecalciferol (VITAMIN D3) 125 MCG (5000 UT) TABS, Take 1 tablet by mouth daily., Disp: , Rfl:    ciprofloxacin (CIPRO) 500 MG tablet, Take 1 tablet (500 mg total) by mouth 2 (two) times daily for 10 days., Disp: 20 tablet, Rfl: 0   CLOFAZIMINE PO, Take 100 mg by mouth daily., Disp: , Rfl:    ethambutol (MYAMBUTOL) 400 MG tablet, Take 2 tablets (800 mg total) by mouth daily., Disp: 180 tablet, Rfl: 3   Flaxseed, Linseed, (FLAX SEED OIL) 1300 MG CAPS, Take 1 tablet by mouth daily., Disp: , Rfl:    Melatonin  10 MG CAPS, Take 10 mg by mouth at bedtime., Disp: , Rfl:    omeprazole (PRILOSEC) 40 MG capsule, Take 1 capsule (40 mg total) by mouth daily., Disp: 90 capsule, Rfl: 3   Probiotic Product (PROBIOTIC DAILY PO), Take 1 capsule by mouth daily., Disp: , Rfl:    Propylene Glycol (SYSTANE BALANCE OP), Place 1 drop into the left eye daily as needed (dry eyes)., Disp: , Rfl:    Respiratory Therapy Supplies (FLUTTER) DEVI, Use as directed., Disp: 1 each, Rfl: 0   rifampin (RIFADIN) 300 MG capsule, Take 2 capsules (600 mg total) by mouth daily., Disp: 180 capsule, Rfl: 3   Spacer/Aero-Holding Chambers (AEROCHAMBER MV) inhaler, Use as instructed, Disp: 1 each, Rfl: 2   TURMERIC PO, Take 2,000 mg by mouth daily. , Disp: , Rfl:    zinc gluconate 50 MG tablet, Take 50 mg by mouth daily., Disp: , Rfl:

## 2022-10-13 NOTE — Patient Instructions (Signed)
Nice to see you again  I am glad things are somewhat improved.  To help with cough improved further, take ciprofloxacin 500 mg tablet in the morning and evening, twice a day.  For 10 days.  I will send an order for a vest to help break up the mucus and help clear mucus and stop it from accumulating and causing severe symptoms.  Return to clinic in 3 months or sooner as needed with Dr. Silas Flood

## 2022-10-15 ENCOUNTER — Telehealth: Payer: Self-pay | Admitting: Pharmacist

## 2022-10-15 ENCOUNTER — Other Ambulatory Visit: Payer: Self-pay | Admitting: Pharmacist

## 2022-10-15 NOTE — Progress Notes (Signed)
2 bottles of clofazimine are located in the pharmacy office when patient needs a refill.   Kiara Medina Kiara Medina, PharmD, BCIDP, AAHIVP, CPP Clinical Pharmacist Practitioner Infectious Diseases [facility] 10/15/2022, 10:15 AM

## 2022-10-15 NOTE — Telephone Encounter (Signed)
2 bottles of clofazimine are located in the pharmacy office when patient needs a refill.   Kiara Medina, PharmD, BCIDP, AAHIVP, CPP Clinical Pharmacist Practitioner Infectious Diseases [facility] 10/15/2022, 10:16 AM

## 2022-10-26 ENCOUNTER — Ambulatory Visit: Payer: Medicare Other | Admitting: Pulmonary Disease

## 2022-10-26 ENCOUNTER — Encounter: Payer: Self-pay | Admitting: Pulmonary Disease

## 2022-10-26 VITALS — BP 126/64 | HR 61 | Wt 102.6 lb

## 2022-10-26 DIAGNOSIS — J479 Bronchiectasis, uncomplicated: Secondary | ICD-10-CM | POA: Diagnosis not present

## 2022-10-26 DIAGNOSIS — R0609 Other forms of dyspnea: Secondary | ICD-10-CM | POA: Diagnosis not present

## 2022-10-26 NOTE — Patient Instructions (Signed)
Nice to see you again  I sent a referral for pulmonary rehab to start in January 2024 at [facility]  We are working to find out why the vest referral has not gone through.  Return to clinic in 2 months or sooner as needed with Dr. Silas Flood

## 2022-10-27 ENCOUNTER — Other Ambulatory Visit: Payer: Self-pay

## 2022-10-27 ENCOUNTER — Encounter: Payer: Self-pay | Admitting: Internal Medicine

## 2022-10-27 ENCOUNTER — Ambulatory Visit: Payer: Medicare Other | Admitting: Internal Medicine

## 2022-10-27 VITALS — BP 112/72 | HR 100 | Temp 97.9°F | Ht 63.0 in | Wt 103.0 lb

## 2022-10-27 DIAGNOSIS — J42 Unspecified chronic bronchitis: Secondary | ICD-10-CM | POA: Diagnosis not present

## 2022-10-27 DIAGNOSIS — J209 Acute bronchitis, unspecified: Secondary | ICD-10-CM

## 2022-10-27 DIAGNOSIS — A310 Pulmonary mycobacterial infection: Secondary | ICD-10-CM | POA: Diagnosis not present

## 2022-10-27 DIAGNOSIS — E43 Unspecified severe protein-calorie malnutrition: Secondary | ICD-10-CM

## 2022-10-27 NOTE — Assessment & Plan Note (Signed)
I am hopeful that her recent worsening is just an acute exacerbation of her chronic bronchitis and not a sudden flare of her very chronic, smoldering Mycobacterium avium pneumonia.  I have ordered a chest x-ray.  She will follow-up in 2 weeks.

## 2022-10-27 NOTE — Assessment & Plan Note (Signed)
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  She may have gotten some benefit from aerosolized amikacin but has stopped it on 2 occasions because of difficulty tolerating it.  Unfortunately there are very few options to improve her current antibiotic regimen.

## 2022-10-27 NOTE — Assessment & Plan Note (Signed)
She has lost 10 pounds in the last year and a half and remains malnourished.  This is a result of her chronic lung disease.

## 2022-10-27 NOTE — Progress Notes (Addendum)
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    Priority: High   BRONCHIECTASIS 05/23/2009    Priority: Medium    Protein-calorie malnutrition, severe 09/19/2021   SBO (small bowel obstruction) ([place]) 09/18/2021   Depression 06/27/2020   Dysphonia 01/11/2018   Right shoulder pain 01/21/2017   Encounter for hepatitis C screening test for low risk patient 01/21/2017   GERD (gastroesophageal reflux disease) 02/21/2016   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   ACYCLOVIR (ZOVIRAX) 400 MG TABLET    Take 400 mg by mouth 2 (two) times daily.   ALBUTEROL (VENTOLIN HFA) 108 (90 BASE) MCG/ACT INHALER    Inhale 2 puffs into the lungs every 6 (six) hours as needed for wheezing or shortness of breath.   ASCORBIC ACID (VITAMIN C) 500 MG TABLET    Take 500 mg by mouth daily.   ATORVASTATIN (LIPITOR) 20 MG TABLET    Take 20 mg by mouth daily.   B COMPLEX VITAMINS (B COMPLEX-B12 PO)    Take 1 tablet by mouth daily.   BENZONATATE (TESSALON PERLES) 100 MG CAPSULE    Take 1 capsule (100 mg total) by mouth 3 (three) times daily as needed for cough.   CALCIUM CARBONATE (OSCAL) 1500 (600 CA) MG TABS TABLET    Take 600 mg of elemental calcium by mouth 2 (two) times daily with a meal.   CETIRIZINE (ZYRTEC) 10 MG TABLET    Take 10 mg by mouth daily.   CHOLECALCIFEROL (VITAMIN D3) 125 MCG (5000 UT) TABS    Take 1 tablet by mouth daily.   CLOFAZIMINE PO    Take 100  mg by mouth daily.   ETHAMBUTOL (MYAMBUTOL) 400 MG TABLET    Take 2 tablets (800 mg total) by mouth daily.   FLAXSEED, LINSEED, (FLAX SEED OIL) 1300 MG CAPS    Take 1 tablet by mouth daily.   MELATONIN 10 MG CAPS    Take 10 mg by mouth at bedtime.   OMEPRAZOLE (PRILOSEC) 40 MG CAPSULE    Take 1 capsule (40 mg total) by mouth daily.   PROBIOTIC PRODUCT (PROBIOTIC DAILY PO)    Take 1 capsule by mouth daily.   PROPYLENE GLYCOL (SYSTANE BALANCE OP)    Place 1 drop into the left eye daily as needed (dry eyes).   RESPIRATORY THERAPY SUPPLIES (FLUTTER) DEVI    Use as directed.   RIFAMPIN (RIFADIN) 300 MG CAPSULE    Take 2 capsules (600 mg total) by mouth daily.   SPACER/AERO-HOLDING CHAMBERS (AEROCHAMBER MV) INHALER    Use as instructed   TURMERIC PO    Take 2,000 mg by mouth daily.    ZINC GLUCONATE 50 MG TABLET    Take 50 mg by mouth daily.  Modified Medications   No medications on file  Discontinued Medications   No medications on file    Subjective: Kiara Medina is in for her routine  follow-up visit.  She continues taking clofazimine, ethambutol and rifampin for her very chronic, smoldering Mycobacterium avium pneumonia.  She has been on and off therapy since 2015.  She has continued to have occasional severe coughing spasms.    She feels like she is tolerating her oral antibiotics well other than more frequent bowel movements.  She denies having any diarrhea.  She stopped taking aerosolized amikacin in July and felt like her coughing spasms improved.  She does not have much of an appetite but has not been losing any more weight.    Her cough began to get much worse about 3 weeks ago.  It became productive of yellow sputum and she noted a dramatic increase in dyspnea on exertion.  She also developed rhinorrhea.  Her PCP gave her a Kenalog injection and started her on prednisone and amoxicillin.  She did not have much improvement and was seen by her pulmonologist who started her on ciprofloxacin.  She has  noted some improvement recently.  She is coughing less and it is primarily a dry cough now.  She is still quite short of breath.  She developed diarrhea while on amoxicillin and ciprofloxacin.  Her diarrhea is improving after stopping ciprofloxacin last week.  Review of Systems: Review of Systems  Constitutional:  Negative for chills, diaphoresis, fever and weight loss.  Respiratory:  Positive for cough, sputum production and shortness of breath. Negative for hemoptysis and wheezing.   Cardiovascular:  Negative for chest pain.  Gastrointestinal:  Positive for diarrhea. Negative for abdominal pain, nausea and vomiting.    Past Medical History:  Diagnosis Date   Allergic rhinitis, cause unspecified    Bronchiectasis without acute exacerbation (HCC)    Cough    Sinusitis     Social History   Tobacco Use   Smoking status: Former    Packs/day: 1.00    Years: 20.00    Total pack years: 20.00    Types: Cigarettes    Quit date: 12/14/1985    Years since quitting: 36.8   Smokeless tobacco: Never  Vaping Use   Vaping Use: Never used  Substance Use Topics   Alcohol use: Not Currently    Alcohol/week: 1.0 standard drink of alcohol    Types: 1 Glasses of wine per week    Comment: occ   Drug use: No    Family History  Problem Relation Age of Onset   Emphysema Sister    Stroke Mother    Clotting disorder Mother     No Known Allergies  Objective: Vitals:   10/27/22 1514  BP: 112/72  Pulse: 100  Temp: 97.9 F (36.6 C)  TempSrc: Temporal  SpO2: 98%  Weight: 103 lb (46.7 kg)  Height: '5\' 3"'$  (1.6 m)   Body mass index is 18.25 kg/m.  Physical Exam Constitutional:      Comments: Her weight is stable at 103 pounds.  She has a prominent dry cough throughout the exam.  Cardiovascular:     Rate and Rhythm: Normal rate and regular rhythm.     Heart sounds: No murmur heard. Pulmonary:     Effort: Pulmonary effort is normal.     Breath sounds: No wheezing, rhonchi or rales.      Comments: Her oxygen saturation dropped transiently to 79% that came up to 83% while walking in clinic.  It increased rapidly to 95% after she sat down. Psychiatric:        Mood and Affect: Mood normal.  Lab Results Lab Results  Component Value Date   WBC 7.3 05/19/2022   HGB 12.6 05/19/2022   HCT 39.4 05/19/2022   MCV 90.6 05/19/2022   PLT 242 05/19/2022   CMP     Component Value Date/Time   NA 138 05/19/2022 1559   K 3.8 05/19/2022 1559   CL 102 05/19/2022 1559   CO2 29 05/19/2022 1559   GLUCOSE 117 (H) 05/19/2022 1559   BUN 13 05/19/2022 1559   CREATININE 0.74 05/19/2022 1559   CALCIUM 8.9 05/19/2022 1559   PROT 6.3 05/19/2022 1559   ALBUMIN 2.7 (L) 09/19/2021 0536   AST 19 05/19/2022 1559   ALT 11 05/19/2022 1559   ALKPHOS 77 09/19/2021 0536   BILITOT 0.3 05/19/2022 1559   GFRNONAA >60 09/27/2021 0539   GFRAA >60 06/01/2011 1457   CMP     Component Value Date/Time   NA 138 05/19/2022 1559   K 3.8 05/19/2022 1559   CL 102 05/19/2022 1559   CO2 29 05/19/2022 1559   GLUCOSE 117 (H) 05/19/2022 1559   BUN 13 05/19/2022 1559   CREATININE 0.74 05/19/2022 1559   CALCIUM 8.9 05/19/2022 1559   PROT 6.3 05/19/2022 1559   ALBUMIN 2.7 (L) 09/19/2021 0536   AST 19 05/19/2022 1559   ALT 11 05/19/2022 1559   ALKPHOS 77 09/19/2021 0536   BILITOT 0.3 05/19/2022 1559   GFRNONAA >60 09/27/2021 0539   GFRAA >60 06/01/2011 1457   Sputum AFB culture 06/01/2022 positive for Mycobacterium avium   Problem List Items Addressed This Visit       High   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  She may have gotten some benefit from aerosolized amikacin but has stopped it on 2 occasions because of difficulty tolerating it.  Unfortunately there are very few options to improve her current antibiotic regimen.      Acute exacerbation of chronic bronchitis ([place]) - Primary     I am hopeful that her recent worsening is just an acute exacerbation of her chronic bronchitis and not a sudden flare of her very chronic, smoldering Mycobacterium avium pneumonia.  I have ordered a chest x-ray.  She will follow-up in 2 weeks.      Relevant Orders   DG Chest 2 View     Unprioritized   Protein-calorie malnutrition, severe    She has lost 10 pounds in the last year and a half and remains malnourished.  This is a result of her chronic lung disease.       Michel Bickers, MD [facility] [phone] pager   [phone] cell 10/27/2022, 3:43 PM

## 2022-10-27 NOTE — Progress Notes (Signed)
Synopsis: Referred in 2010 for bronchiectasis by Nicoletta Dress, MD.  Previously patient of Dr. Normajean Baxter and Dr. Lake Bells and Dr. Carlis Abbott.  Subjective:   PATIENT ID: Kiara Medina DOB: 07/28/1946, MRN: [id number]  Chief Complaint  Patient presents with   Follow-up    Pt is here due to still not feeling well. Pt completed 10 day course of cipro. Pt states she still has issues with catching her breath. No appitate what so ever. Pt states she is still using albuterol. Pt wants to talk about the vest.    Kiara Medina is a 76 y.o. woman with a history of MDR MAC and bronchiectasis who follow up for acute visit with worsening cough.    Completed her ciprofloxacin.  Does think this helps some.  She notes her breathing continues to worsen overall.  Oxygen saturation a bit low today compared to baseline.  Weight still down.  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  Reviewed again CT scan 02/2022 that shows worsening NTM burden.  I fear this will likely continue to get worse.  Our goal is to optimize therapies to make things as good as possible.  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  She remains off the inhaled amikacin given intermittent issues with causing worsening cough.  Looking for clinical trials for MDR NTM.  I will reach out.  I also discussed reenroll in pulmonary rehab given this has been beneficial in the past.  She like to start in the new year.  New referral will be placed today.  She has yet to receive her vest therapy.  I will look into the order, it appears I placed the incorrect order in epic and cannot go into the work queue.  We are working on getting this rectified.  Past Medical History:  Diagnosis Date   Allergic rhinitis, cause unspecified    Bronchiectasis without acute exacerbation (HCC)    Cough    Sinusitis      Family History  Problem Relation Age of Onset   Emphysema Sister    Stroke Mother     Clotting disorder Mother      Past Surgical History:  Procedure Laterality Date   APPENDECTOMY     CATARACT EXTRACTION  08/2013   CHOLECYSTECTOMY     TUBAL LIGATION     VESICOVAGINAL FISTULA CLOSURE W/ TAH      Social History   Socioeconomic History   Marital status: Widowed    Spouse name: Not on file   Number of children: Not on file   Years of education: Not on file   Highest education level: Not on file  Occupational History   Occupation: realtor  Tobacco Use   Smoking status: Former    Packs/day: 1.00    Years: 20.00    Total pack years: 20.00    Types: Cigarettes    Quit date: 12/14/1985    Years since quitting: 36.8   Smokeless tobacco: Never  Vaping Use   Vaping Use: Never used  Substance and Sexual Activity   Alcohol use: Not Currently    Alcohol/week: 1.0 standard drink of alcohol    Types: 1 Glasses of wine per week    Comment: occ   Drug use: No   Sexual activity: Not on file  Other Topics Concern   Not on file  Social History Narrative   Not on file   Social Determinants of Health  Financial Resource Strain: Not on file  Food Insecurity: Not on file  Transportation Needs: Not on file  Physical Activity: Not on file  Stress: Not on file  Social Connections: Not on file  Intimate Partner Violence: Not on file     No Known Allergies   Immunization History  Administered Date(s) Administered   Fluad Quad(high Dose 65+) 09/24/2020   Influenza Split 09/13/2012, 09/06/2014   Influenza Whole 09/09/2009, 08/14/2010, 09/14/2011   Influenza, High Dose Seasonal PF 10/10/2015, 09/02/2021   Influenza,inj,Quad PF,6+ Mos 09/13/2013, 09/21/2016, 08/26/2017, 08/22/2018   Influenza-Unspecified 08/29/2019   Moderna Sars-Covid-2 Vaccination 01/03/2020, 01/31/2020, 08/02/2020, 05/06/2021   Pfizer Covid-19 Vaccine Bivalent Booster 5y-11y 11/24/2021   Pneumococcal Conjugate-13 11/24/2014   Pneumococcal Polysaccharide-23 07/17/2009    Outpatient Medications  Prior to Visit  Medication Sig Dispense Refill   acetaminophen (TYLENOL) 500 MG tablet Take 1,000 mg by mouth every 6 (six) hours as needed for moderate pain.     acyclovir (ZOVIRAX) 400 MG tablet Take 400 mg by mouth 2 (two) times daily.     albuterol (VENTOLIN HFA) 108 (90 Base) MCG/ACT inhaler Inhale 2 puffs into the lungs every 6 (six) hours as needed for wheezing or shortness of breath. 18 g 3   ascorbic acid (VITAMIN C) 500 MG tablet Take 500 mg by mouth daily.     atorvastatin (LIPITOR) 20 MG tablet Take 20 mg by mouth daily.     B Complex Vitamins (B COMPLEX-B12 PO) Take 1 tablet by mouth daily.     benzonatate (TESSALON PERLES) 100 MG capsule Take 1 capsule (100 mg total) by mouth 3 (three) times daily as needed for cough. 270 capsule 3   calcium carbonate (OSCAL) 1500 (600 Ca) MG TABS tablet Take 600 mg of elemental calcium by mouth 2 (two) times daily with a meal.     cetirizine (ZYRTEC) 10 MG tablet Take 10 mg by mouth daily.     Cholecalciferol (VITAMIN D3) 125 MCG (5000 UT) TABS Take 1 tablet by mouth daily.     CLOFAZIMINE PO Take 100 mg by mouth daily.     ethambutol (MYAMBUTOL) 400 MG tablet Take 2 tablets (800 mg total) by mouth daily. 180 tablet 3   Flaxseed, Linseed, (FLAX SEED OIL) 1300 MG CAPS Take 1 tablet by mouth daily.     Melatonin 10 MG CAPS Take 10 mg by mouth at bedtime.     omeprazole (PRILOSEC) 40 MG capsule Take 1 capsule (40 mg total) by mouth daily. 90 capsule 3   Probiotic Product (PROBIOTIC DAILY PO) Take 1 capsule by mouth daily.     Propylene Glycol (SYSTANE BALANCE OP) Place 1 drop into the left eye daily as needed (dry eyes).     Respiratory Therapy Supplies (FLUTTER) DEVI Use as directed. 1 each 0   rifampin (RIFADIN) 300 MG capsule Take 2 capsules (600 mg total) by mouth daily. 180 capsule 3   Spacer/Aero-Holding Chambers (AEROCHAMBER MV) inhaler Use as instructed 1 each 2   TURMERIC PO Take 2,000 mg by mouth daily.      zinc gluconate 50 MG tablet  Take 50 mg by mouth daily.     No facility-administered medications prior to visit.    Review of Systems  N/a   Objective:   Vitals:   10/26/22 1355  BP: 126/64  Pulse: 61  SpO2: 90%  Weight: 102 lb 9.6 oz (46.5 kg)      90% on   RA BMI Readings from Last 3  Encounters:  10/26/22 18.17 kg/m  10/13/22 18.35 kg/m  07/23/22 18.21 kg/m   Wt Readings from Last 3 Encounters:  10/26/22 102 lb 9.6 oz (46.5 kg)  10/13/22 103 lb 9.6 oz (47 kg)  07/23/22 102 lb 12.8 oz (46.6 kg)    Physical Exam BP 126/64 (BP Location: Left Arm, Patient Position: Sitting, Cuff Size: Normal)   Pulse 61   Wt 102 lb 9.6 oz (46.5 kg)   SpO2 90%   BMI 18.17 kg/m  General: Well-appearing in no acute distress, thin Eyes: EOMI, icterus Respiratory: Clear, no wheeze, normal work of breathing Cardiovascular: Regular rhythm, no murmur Extremities: Warm, no edema     CBC    Component Value Date/Time   WBC 7.3 05/19/2022 1559   RBC 4.35 05/19/2022 1559   HGB 12.6 05/19/2022 1559   HCT 39.4 05/19/2022 1559   PLT 242 05/19/2022 1559   MCV 90.6 05/19/2022 1559   MCH 29.0 05/19/2022 1559   MCHC 32.0 05/19/2022 1559   RDW 14.3 05/19/2022 1559   LYMPHSABS 1.3 09/24/2021 0519   MONOABS 0.5 09/24/2021 0519   EOSABS 0.2 09/24/2021 0519   BASOSABS 0.0 09/24/2021 0519    CHEMISTRY No results for input(s): "NA", "K", "CL", "CO2", "GLUCOSE", "BUN", "CREATININE", "CALCIUM", "MG", "PHOS" in the last 168 hours. CrCl cannot be calculated (Patient's most recent lab result is older than the maximum 21 days allowed.).  12/28/2019: IgG 954 IgM 96 IgA 289 IgE 10 IgG subclasses normal (other than increased IgG subclass 3)  Cultures: 3/10322 - pending 11/23/2019-MAI 05/17/2019-MAI 03/15/2019-MAI 01/10/2019 MAI 11/07/2018-MAI 10/10/2018-MAI  08/22/2018-MAI 07/11/2018-MAI 05/31/2018-MAI MAI susceptibilities-resistant clarithromycin, linezolid.  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  Susceptible to amikacin, cefepime, clarithromycin, imipenem, linezolid, trimethoprim sulfamethoxazole. 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  Tree-in-bud opacities diffusely.  Mucus impaction in airways.  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     Assessment & Plan:     ICD-10-CM   1. DOE (dyspnea on exertion)  R06.09 AMB referral to pulmonary rehabilitation    2. Bronchiectasis without acute exacerbation ([place])  J47.9 AMB REFERRAL FOR DME         Dyspnea on exertion likely due to chronic bronchiectasis-improved on LAMA/LABA; worsed when back off these meds. Component of deconditioning.  Has seasonal allergies, describes bronchospasm, possible asthma. 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  No obvious immunodeficiencies.  Progressive on CT over the last 6 years.  No evidence of hypersensitivity pneumonitis from inhaled amikacin on CT scan.  With worsening cough concerning for  exacerbation. --s/p 10 days Amoxicillin and prednisone and ciprofloxacin 500 mg BID x 10 days - Hypertonic saline  to twice a day  --f/u order for vest therapy today -Continue current multi drug MAI therapy and ID follow-up.  I agree with Dr. Megan Salon that she has limited treatment options and would likely progress faster off meds --stopped inhaled amikacin once again 06/2022 due to worsening cough -CT chest hi res 02/2022 with worsned signs of NTM disease, will ask about clinical trials  Weight loss- concerned this may be a MAC secondary effect, complicated by fatigue and DOE.  Related to metabolic needs from dyspnea, cough.  Poor appetite.  This is ongoing. Weight overall stable.  RTC in 2 months with Dr. Silas Flood.     Current Outpatient Medications:    acetaminophen (TYLENOL) 500 MG tablet, Take 1,000 mg by mouth every 6 (six) hours as needed for moderate pain., Disp: , Rfl:    acyclovir (ZOVIRAX) 400 MG tablet, Take 400 mg by mouth 2 (two) times daily., Disp: , Rfl:    albuterol (VENTOLIN HFA) 108 (90 Base) MCG/ACT inhaler, Inhale 2 puffs into the lungs every 6 (six) hours as needed for wheezing or shortness of breath., Disp: 18 g, Rfl: 3   ascorbic acid (VITAMIN C) 500 MG tablet, Take 500 mg by mouth daily., Disp: , Rfl:    atorvastatin (LIPITOR) 20 MG tablet, Take 20 mg by mouth daily., Disp: , Rfl:    B Complex Vitamins (B COMPLEX-B12 PO), Take 1 tablet by mouth daily., Disp: , Rfl:    benzonatate (TESSALON PERLES) 100 MG capsule, Take 1 capsule (100 mg total) by mouth 3 (three) times daily as needed for cough., Disp: 270 capsule, Rfl: 3   calcium carbonate (OSCAL) 1500 (600 Ca) MG TABS tablet, Take 600 mg of elemental calcium by mouth 2 (two) times daily with a meal., Disp: , Rfl:    cetirizine (ZYRTEC) 10 MG tablet, Take 10 mg by mouth daily., Disp: , Rfl:    Cholecalciferol (VITAMIN D3) 125 MCG (5000 UT) TABS, Take 1 tablet by mouth daily., Disp: , Rfl:    CLOFAZIMINE PO, Take 100 mg  by mouth daily., Disp: , Rfl:    ethambutol (MYAMBUTOL) 400 MG tablet, Take 2 tablets (800 mg total) by mouth daily., Disp: 180 tablet, Rfl: 3   Flaxseed, Linseed, (FLAX SEED OIL) 1300 MG CAPS, Take 1 tablet by mouth daily., Disp: , Rfl:    Melatonin 10 MG CAPS, Take 10 mg by mouth at bedtime., Disp: , Rfl:    omeprazole (PRILOSEC) 40 MG capsule, Take 1 capsule (40 mg total) by mouth daily., Disp: 90 capsule, Rfl: 3   Probiotic Product (PROBIOTIC DAILY PO), Take 1 capsule by mouth daily., Disp: , Rfl:    Propylene Glycol (SYSTANE BALANCE OP), Place 1 drop into the left eye daily as needed (dry eyes)., Disp: , Rfl:    Respiratory Therapy Supplies (FLUTTER) DEVI, Use as directed., Disp: 1 each, Rfl: 0   rifampin (RIFADIN) 300 MG capsule, Take 2 capsules (600 mg total) by mouth daily., Disp: 180 capsule, Rfl: 3   Spacer/Aero-Holding Chambers (AEROCHAMBER MV) inhaler, Use as instructed, Disp: 1 each, Rfl: 2   TURMERIC PO, Take 2,000 mg by mouth daily. , Disp: , Rfl:    zinc gluconate 50 MG tablet, Take 50 mg by mouth daily., Disp: , Rfl:

## 2022-10-29 ENCOUNTER — Ambulatory Visit: Payer: Medicare Other | Admitting: Internal Medicine

## 2022-10-29 ENCOUNTER — Ambulatory Visit
Admission: RE | Admit: 2022-10-29 | Discharge: 2022-10-29 | Disposition: A | Discharge status: Home / Self Care | Payer: Medicare Other | Source: Ambulatory Visit | Attending: Internal Medicine | Admitting: Internal Medicine

## 2022-10-29 DIAGNOSIS — J209 Acute bronchitis, unspecified: Secondary | ICD-10-CM

## 2022-10-29 DIAGNOSIS — J479 Bronchiectasis, uncomplicated: Secondary | ICD-10-CM | POA: Diagnosis not present

## 2022-10-29 DIAGNOSIS — R918 Other nonspecific abnormal finding of lung field: Secondary | ICD-10-CM | POA: Diagnosis not present

## 2022-11-02 ENCOUNTER — Telehealth: Payer: Self-pay

## 2022-11-02 NOTE — Telephone Encounter (Signed)
Patient aware and voiced her understanding.    [place], CMA

## 2022-11-02 NOTE — Telephone Encounter (Signed)
Patient called requesting chest x-ray results. Advised her we will call once Dr.Campbell has a chance to review.    [place], CMA

## 2022-11-06 DIAGNOSIS — J479 Bronchiectasis, uncomplicated: Secondary | ICD-10-CM | POA: Diagnosis not present

## 2022-11-11 ENCOUNTER — Ambulatory Visit (INDEPENDENT_AMBULATORY_CARE_PROVIDER_SITE_OTHER): Payer: Medicare Other | Admitting: Internal Medicine

## 2022-11-11 ENCOUNTER — Other Ambulatory Visit: Payer: Self-pay

## 2022-11-11 DIAGNOSIS — Z16342 Resistance to multiple antimycobacterial drugs: Secondary | ICD-10-CM

## 2022-11-11 DIAGNOSIS — A310 Pulmonary mycobacterial infection: Secondary | ICD-10-CM | POA: Diagnosis not present

## 2022-11-11 NOTE — Progress Notes (Signed)
Virtual Visit via Video Note  I connected with NORENA BRATTON on 11/11/22 at 10:45 AM EST by a video enabled telemedicine application and verified that I am speaking with the correct person using two identifiers.  Location: Patient: Home Provider: [facility]   I discussed the limitations of evaluation and management by telemedicine and the availability of in person appointments. The patient expressed understanding and agreed to proceed.  History of Present Illness: I called and spoke with Bethena Roys today.  She is feeling better.  She started using a vibratory vest 5 days ago and finds that it has helped decrease her nagging cough.  Her weight is stable at 100 pounds.  She has not had any fever.   Observations/Objective: Sputum AFB smear 06/01/2022 showed 3+ acid-fast bacilli and cultures grew Mycobacterium avium again  Chest x-ray 10/29/2022 IMPRESSION: Chronic infiltrates remain in the lungs. Findings are similar since February 10, 2022 although it is difficult to compare today's chest x-ray to the CT scan scout view. An acute on chronic infiltrate is not excluded. CT imaging could better assess for changes.  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  She is doing better now and we agree that she will not restart aerosolized amikacin at this time.  Follow Up Instructions: Continue clofazimine, ethambutol and rifampin Follow-up in 3 months   I discussed the assessment and treatment plan with the patient. The patient was provided an opportunity to ask questions and all were answered. The patient agreed with the plan and demonstrated an understanding of the instructions.   The patient was advised to call back or seek an in-person evaluation if the symptoms worsen or if the condition fails to improve as anticipated.  I provided 14 minutes of non-face-to-face time during this encounter.   Michel Bickers, MD

## 2022-11-19 DIAGNOSIS — R7301 Impaired fasting glucose: Secondary | ICD-10-CM | POA: Diagnosis not present

## 2022-11-19 DIAGNOSIS — E559 Vitamin D deficiency, unspecified: Secondary | ICD-10-CM | POA: Diagnosis not present

## 2022-11-19 DIAGNOSIS — K219 Gastro-esophageal reflux disease without esophagitis: Secondary | ICD-10-CM | POA: Diagnosis not present

## 2022-11-19 DIAGNOSIS — E785 Hyperlipidemia, unspecified: Secondary | ICD-10-CM | POA: Diagnosis not present

## 2022-12-06 DIAGNOSIS — J479 Bronchiectasis, uncomplicated: Secondary | ICD-10-CM | POA: Diagnosis not present

## 2022-12-22 DIAGNOSIS — J471 Bronchiectasis with (acute) exacerbation: Secondary | ICD-10-CM | POA: Diagnosis not present

## 2022-12-22 DIAGNOSIS — J42 Unspecified chronic bronchitis: Secondary | ICD-10-CM | POA: Diagnosis not present

## 2022-12-22 DIAGNOSIS — J439 Emphysema, unspecified: Secondary | ICD-10-CM | POA: Diagnosis not present

## 2022-12-22 DIAGNOSIS — J479 Bronchiectasis, uncomplicated: Secondary | ICD-10-CM | POA: Diagnosis not present

## 2022-12-23 DIAGNOSIS — J42 Unspecified chronic bronchitis: Secondary | ICD-10-CM | POA: Diagnosis not present

## 2022-12-23 DIAGNOSIS — J439 Emphysema, unspecified: Secondary | ICD-10-CM | POA: Diagnosis not present

## 2022-12-23 DIAGNOSIS — J479 Bronchiectasis, uncomplicated: Secondary | ICD-10-CM | POA: Diagnosis not present

## 2022-12-23 DIAGNOSIS — J471 Bronchiectasis with (acute) exacerbation: Secondary | ICD-10-CM | POA: Diagnosis not present

## 2022-12-25 DIAGNOSIS — J439 Emphysema, unspecified: Secondary | ICD-10-CM | POA: Diagnosis not present

## 2022-12-25 DIAGNOSIS — J479 Bronchiectasis, uncomplicated: Secondary | ICD-10-CM | POA: Diagnosis not present

## 2022-12-25 DIAGNOSIS — J42 Unspecified chronic bronchitis: Secondary | ICD-10-CM | POA: Diagnosis not present

## 2022-12-25 DIAGNOSIS — J471 Bronchiectasis with (acute) exacerbation: Secondary | ICD-10-CM | POA: Diagnosis not present

## 2022-12-28 DIAGNOSIS — J42 Unspecified chronic bronchitis: Secondary | ICD-10-CM | POA: Diagnosis not present

## 2022-12-28 DIAGNOSIS — J471 Bronchiectasis with (acute) exacerbation: Secondary | ICD-10-CM | POA: Diagnosis not present

## 2022-12-28 DIAGNOSIS — J479 Bronchiectasis, uncomplicated: Secondary | ICD-10-CM | POA: Diagnosis not present

## 2022-12-28 DIAGNOSIS — J439 Emphysema, unspecified: Secondary | ICD-10-CM | POA: Diagnosis not present

## 2022-12-30 ENCOUNTER — Encounter: Payer: Self-pay | Admitting: Pulmonary Disease

## 2022-12-30 ENCOUNTER — Ambulatory Visit: Payer: Medicare Other | Admitting: Pulmonary Disease

## 2022-12-30 VITALS — BP 118/66 | HR 98 | Wt 101.2 lb

## 2022-12-30 DIAGNOSIS — J42 Unspecified chronic bronchitis: Secondary | ICD-10-CM

## 2022-12-30 DIAGNOSIS — J479 Bronchiectasis, uncomplicated: Secondary | ICD-10-CM

## 2022-12-30 DIAGNOSIS — J209 Acute bronchitis, unspecified: Secondary | ICD-10-CM | POA: Diagnosis not present

## 2022-12-30 DIAGNOSIS — J439 Emphysema, unspecified: Secondary | ICD-10-CM | POA: Diagnosis not present

## 2022-12-30 DIAGNOSIS — J471 Bronchiectasis with (acute) exacerbation: Secondary | ICD-10-CM | POA: Diagnosis not present

## 2022-12-30 MED ORDER — PREDNISONE 20 MG PO TABS: 20.0000 mg | ORAL_TABLET | Freq: Every day | ORAL | 0 refills | Status: AC

## 2022-12-30 NOTE — Patient Instructions (Signed)
Nice to see you again  I am glad the vest is helping  I hope pulmonary rehab will start showing some improvement in the coming weeks  I sent a prescription for prednisone 20 mg once a day for 5 days to [facility]  Return to clinic in 3 months or sooner as needed with Dr. Silas Flood

## 2023-01-01 DIAGNOSIS — J471 Bronchiectasis with (acute) exacerbation: Secondary | ICD-10-CM | POA: Diagnosis not present

## 2023-01-01 DIAGNOSIS — J42 Unspecified chronic bronchitis: Secondary | ICD-10-CM | POA: Diagnosis not present

## 2023-01-01 DIAGNOSIS — J479 Bronchiectasis, uncomplicated: Secondary | ICD-10-CM | POA: Diagnosis not present

## 2023-01-01 DIAGNOSIS — J439 Emphysema, unspecified: Secondary | ICD-10-CM | POA: Diagnosis not present

## 2023-01-01 NOTE — Progress Notes (Signed)
Synopsis: Referred in 2010 for bronchiectasis by Nicoletta Dress, MD.  Previously patient of Dr. Normajean Baxter and Dr. Lake Bells and Dr. Carlis Abbott.  Subjective:   PATIENT ID: Kiara Medina GENDER: female DOB: 1946-08-22, MRN: [id number]  Chief Complaint  Patient presents with   Follow-up    Follow up for DOE> Pt states that the vest is working well. Pt states she just started pulm rehab last week. Pt is on Albuterol as need. P tis also on tessalon pearls and prilosec. Pt states she is coughing up more mucus than before. The cold weather is making her DOE worse.    Ms. Arrambide is a 77 y.o. woman with a history of MDR MAC and bronchiectasis who returns for routine follow-up.  At last visit, she had continuing issues with cough.  Overall things are worse over the last year compared to prior.  She was able to get the best of I had ordered.  This is greatly improved thickness of mucus.  Help with airway clearance.  She is pleased with this.  Over last few days, may be a viral illness.  Last week or 2 with worsening congestion.  No fever.  Historically has responded well to prednisone and antibiotics.  Avoiding prednisone as we can given MDR NTM however given how effective it was in the past, she would like to try again.  Past Medical History:  Diagnosis Date   Allergic rhinitis, cause unspecified    Bronchiectasis without acute exacerbation (HCC)    Cough    Sinusitis      Family History  Problem Relation Age of Onset   Emphysema Sister    Stroke Mother    Clotting disorder Mother      Past Surgical History:  Procedure Laterality Date   APPENDECTOMY     CATARACT EXTRACTION  08/2013   CHOLECYSTECTOMY     TUBAL LIGATION     VESICOVAGINAL FISTULA CLOSURE W/ TAH      Social History   Socioeconomic History   Marital status: Widowed    Spouse name: Not on file   Number of children: Not on file   Years of education: Not on file   Highest education level: Not on file  Occupational History    Occupation: realtor  Tobacco Use   Smoking status: Former    Packs/day: 1.00    Years: 20.00    Total pack years: 20.00    Types: Cigarettes    Quit date: 12/14/1985    Years since quitting: 37.0   Smokeless tobacco: Never  Vaping Use   Vaping Use: Never used  Substance and Sexual Activity   Alcohol use: Not Currently    Alcohol/week: 1.0 standard drink of alcohol    Types: 1 Glasses of wine per week    Comment: occ   Drug use: No   Sexual activity: Not on file  Other Topics Concern   Not on file  Social History Narrative   Not on file   Social Determinants of Health   Financial Resource Strain: Not on file  Food Insecurity: Not on file  Transportation Needs: Not on file  Physical Activity: Not on file  Stress: Not on file  Social Connections: Not on file  Intimate Partner Violence: Not on file     No Known Allergies   Immunization History  Administered Date(s) Administered   Fluad Quad(high Dose 65+) 09/24/2020   Influenza Split 09/13/2012, 09/06/2014   Influenza Whole 09/09/2009, 08/14/2010, 09/14/2011   Influenza, High  Dose Seasonal PF 10/10/2015, 09/02/2021   Influenza,inj,Quad PF,6+ Mos 09/13/2013, 09/21/2016, 08/26/2017, 08/22/2018   Influenza-Unspecified 08/29/2019, 09/21/2022   Moderna Sars-Covid-2 Vaccination 01/03/2020, 01/31/2020, 08/02/2020, 05/06/2021   Pfizer Covid-19 Vaccine Bivalent Booster 5y-11y 11/24/2021   Pneumococcal Conjugate-13 11/24/2014   Pneumococcal Polysaccharide-23 07/17/2009   RSV,unspecified 10/30/2022    Outpatient Medications Prior to Visit  Medication Sig Dispense Refill   acetaminophen (TYLENOL) 500 MG tablet Take 1,000 mg by mouth every 6 (six) hours as needed for moderate pain.     acyclovir (ZOVIRAX) 400 MG tablet Take 400 mg by mouth 2 (two) times daily.     albuterol (VENTOLIN HFA) 108 (90 Base) MCG/ACT inhaler Inhale 2 puffs into the lungs every 6 (six) hours as needed for wheezing or shortness of breath. 18 g 3    ascorbic acid (VITAMIN C) 500 MG tablet Take 500 mg by mouth daily.     atorvastatin (LIPITOR) 20 MG tablet Take 20 mg by mouth daily.     B Complex Vitamins (B COMPLEX-B12 PO) Take 1 tablet by mouth daily.     benzonatate (TESSALON PERLES) 100 MG capsule Take 1 capsule (100 mg total) by mouth 3 (three) times daily as needed for cough. 270 capsule 3   calcium carbonate (OSCAL) 1500 (600 Ca) MG TABS tablet Take 600 mg of elemental calcium by mouth 2 (two) times daily with a meal.     cetirizine (ZYRTEC) 10 MG tablet Take 10 mg by mouth daily.     Cholecalciferol (VITAMIN D3) 125 MCG (5000 UT) TABS Take 1 tablet by mouth daily.     CLOFAZIMINE PO Take 100 mg by mouth daily.     ethambutol (MYAMBUTOL) 400 MG tablet Take 2 tablets (800 mg total) by mouth daily. 180 tablet 3   Flaxseed, Linseed, (FLAX SEED OIL) 1300 MG CAPS Take 1 tablet by mouth daily.     Melatonin 10 MG CAPS Take 10 mg by mouth at bedtime.     omeprazole (PRILOSEC) 40 MG capsule Take 1 capsule (40 mg total) by mouth daily. 90 capsule 3   Probiotic Product (PROBIOTIC DAILY PO) Take 1 capsule by mouth daily.     Propylene Glycol (SYSTANE BALANCE OP) Place 1 drop into the left eye daily as needed (dry eyes).     Respiratory Therapy Supplies (FLUTTER) DEVI Use as directed. 1 each 0   rifampin (RIFADIN) 300 MG capsule Take 2 capsules (600 mg total) by mouth daily. 180 capsule 3   Spacer/Aero-Holding Chambers (AEROCHAMBER MV) inhaler Use as instructed 1 each 2   TURMERIC PO Take 2,000 mg by mouth daily.      zinc gluconate 50 MG tablet Take 50 mg by mouth daily.     No facility-administered medications prior to visit.    Review of Systems  N/a   Objective:   Vitals:   12/30/22 1413  BP: 118/66  Pulse: 98  SpO2: 98%  Weight: 101 lb 3.2 oz (45.9 kg)      98% on   RA BMI Readings from Last 3 Encounters:  12/30/22 17.93 kg/m  10/27/22 18.25 kg/m  10/26/22 18.17 kg/m   Wt Readings from Last 3 Encounters:  12/30/22  101 lb 3.2 oz (45.9 kg)  10/27/22 103 lb (46.7 kg)  10/26/22 102 lb 9.6 oz (46.5 kg)    Physical Exam BP 118/66 (BP Location: Left Arm, Patient Position: Sitting, Cuff Size: Normal)   Pulse 98   Wt 101 lb 3.2 oz (45.9 kg)   SpO2  98%   BMI 17.93 kg/m  General: Well-appearing in no acute distress, thin Eyes: EOMI, icterus Respiratory: Clear, no wheeze, normal work of breathing Cardiovascular: Regular rhythm, no murmur Extremities: Warm, no edema     CBC    Component Value Date/Time   WBC 7.3 05/19/2022 1559   RBC 4.35 05/19/2022 1559   HGB 12.6 05/19/2022 1559   HCT 39.4 05/19/2022 1559   PLT 242 05/19/2022 1559   MCV 90.6 05/19/2022 1559   MCH 29.0 05/19/2022 1559   MCHC 32.0 05/19/2022 1559   RDW 14.3 05/19/2022 1559   LYMPHSABS 1.3 09/24/2021 0519   MONOABS 0.5 09/24/2021 0519   EOSABS 0.2 09/24/2021 0519   BASOSABS 0.0 09/24/2021 0519    CHEMISTRY No results for input(s): "NA", "K", "CL", "CO2", "GLUCOSE", "BUN", "CREATININE", "CALCIUM", "MG", "PHOS" in the last 168 hours. CrCl cannot be calculated (Patient's most recent lab result is older than the maximum 21 days allowed.).  12/28/2019: IgG 954 IgM 96 IgA 289 IgE 10 IgG subclasses normal (other than increased IgG subclass 3)  Cultures: 3/10322 - pending 11/23/2019-MAI 05/17/2019-MAI 03/15/2019-MAI 01/10/2019 MAI 11/07/2018-MAI 10/10/2018-MAI  08/22/2018-MAI 07/11/2018-MAI 05/31/2018-MAI MAI susceptibilities-resistant clarithromycin, linezolid.  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  Susceptible to amikacin, cefepime, clarithromycin, imipenem, linezolid, trimethoprim sulfamethoxazole. 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  Tree-in-bud opacities diffusely.  Mucus impaction in airways.  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     Assessment & Plan:   No diagnosis found.      Dyspnea on exertion likely due to chronic bronchiectasis and asthma-improved on LAMA/LABA; worsed when back off these meds. Component of deconditioning.  Has seasonal allergies, describes bronchospasm. 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  No obvious immunodeficiencies.  Progressive on CT over the last 6 years.  No evidence of hypersensitivity pneumonitis from inhaled amikacin on CT scan.  - Hypertonic saline to twice a day  --f/u order for vest therapy today -Continue current multi drug MAI therapy and ID follow-up.  I agree with Dr. Megan Salon that she has limited treatment options and would likely progress faster off meds --stopped inhaled amikacin once again 06/2022 due to worsening cough -CT chest hi res 02/2022 with worsned signs of NTM disease, will ask about clinical trials  Weight loss- concerned this may be a MAC secondary effect, complicated by fatigue and DOE.  Related to metabolic needs from dyspnea, cough.  Poor appetite.  This is ongoing. Weight overall stable.  RTC in 2  months with Dr. Silas Flood.     Current Outpatient Medications:    acetaminophen (TYLENOL) 500 MG tablet, Take  1,000 mg by mouth every 6 (six) hours as needed for moderate pain., Disp: , Rfl:    acyclovir (ZOVIRAX) 400 MG tablet, Take 400 mg by mouth 2 (two) times daily., Disp: , Rfl:    albuterol (VENTOLIN HFA) 108 (90 Base) MCG/ACT inhaler, Inhale 2 puffs into the lungs every 6 (six) hours as needed for wheezing or shortness of breath., Disp: 18 g, Rfl: 3   ascorbic acid (VITAMIN C) 500 MG tablet, Take 500 mg by mouth daily., Disp: , Rfl:    atorvastatin (LIPITOR) 20 MG tablet, Take 20 mg by mouth daily., Disp: , Rfl:    B Complex Vitamins (B COMPLEX-B12 PO), Take 1 tablet by mouth daily., Disp: , Rfl:    benzonatate (TESSALON PERLES) 100 MG capsule, Take 1 capsule (100 mg total) by mouth 3 (three) times daily as needed for cough., Disp: 270 capsule, Rfl: 3   calcium carbonate (OSCAL) 1500 (600 Ca) MG TABS tablet, Take 600 mg of elemental calcium by mouth 2 (two) times daily with a meal., Disp: , Rfl:    cetirizine (ZYRTEC) 10 MG tablet, Take 10 mg by mouth daily., Disp: , Rfl:    Cholecalciferol (VITAMIN D3) 125 MCG (5000 UT) TABS, Take 1 tablet by mouth daily., Disp: , Rfl:    CLOFAZIMINE PO, Take 100 mg by mouth daily., Disp: , Rfl:    ethambutol (MYAMBUTOL) 400 MG tablet, Take 2 tablets (800 mg total) by mouth daily., Disp: 180 tablet, Rfl: 3   Flaxseed, Linseed, (FLAX SEED OIL) 1300 MG CAPS, Take 1 tablet by mouth daily., Disp: , Rfl:    Melatonin 10 MG CAPS, Take 10 mg by mouth at bedtime., Disp: , Rfl:    omeprazole (PRILOSEC) 40 MG capsule, Take 1 capsule (40 mg total) by mouth daily., Disp: 90 capsule, Rfl: 3   predniSONE (DELTASONE) 20 MG tablet, Take 1 tablet (20 mg total) by mouth daily with breakfast for 5 days., Disp: 5 tablet, Rfl: 0   Probiotic Product (PROBIOTIC DAILY PO), Take 1 capsule by mouth daily., Disp: , Rfl:    Propylene Glycol (SYSTANE BALANCE OP), Place 1 drop into  the left eye daily as needed (dry eyes)., Disp: , Rfl:    Respiratory Therapy Supplies (FLUTTER) DEVI, Use as directed., Disp: 1 each, Rfl: 0   rifampin (RIFADIN) 300 MG capsule, Take 2 capsules (600 mg total) by mouth daily., Disp: 180 capsule, Rfl: 3   Spacer/Aero-Holding Chambers (AEROCHAMBER MV) inhaler, Use as instructed, Disp: 1 each, Rfl: 2   TURMERIC PO, Take 2,000 mg by mouth daily. , Disp: , Rfl:    zinc gluconate 50 MG tablet, Take 50 mg by mouth daily., Disp: , Rfl:

## 2023-01-04 DIAGNOSIS — J471 Bronchiectasis with (acute) exacerbation: Secondary | ICD-10-CM | POA: Diagnosis not present

## 2023-01-06 DIAGNOSIS — J479 Bronchiectasis, uncomplicated: Secondary | ICD-10-CM | POA: Diagnosis not present

## 2023-01-10 DIAGNOSIS — R509 Fever, unspecified: Secondary | ICD-10-CM | POA: Diagnosis not present

## 2023-01-10 DIAGNOSIS — M791 Myalgia, unspecified site: Secondary | ICD-10-CM | POA: Diagnosis not present

## 2023-01-10 DIAGNOSIS — R059 Cough, unspecified: Secondary | ICD-10-CM | POA: Diagnosis not present

## 2023-01-11 ENCOUNTER — Telehealth: Payer: Self-pay

## 2023-01-11 ENCOUNTER — Telehealth: Payer: Self-pay | Admitting: Pulmonary Disease

## 2023-01-11 NOTE — Telephone Encounter (Signed)
FYI:  Dr Silas Flood patient tested positive for covid.Called her and she states she is feeling ok. She is on medication for covid and has no issues noted.   Just FYI

## 2023-01-11 NOTE — Telephone Encounter (Signed)
Tested poss for Covid this weekend at [employer]. That Dr. Michela Pitcher she was to let Dr. Silas Flood know. No need to call unless you need to.

## 2023-01-11 NOTE — Telephone Encounter (Signed)
Patient called stating she tested positive for COVID over the weekend and was advised to notify all of her providers. Jerell Demery [name]

## 2023-01-12 NOTE — Telephone Encounter (Signed)
Patient returned phon call to Dr. Megan Salon and spoke with him on the phone.

## 2023-01-22 DIAGNOSIS — M85851 Other specified disorders of bone density and structure, right thigh: Secondary | ICD-10-CM | POA: Diagnosis not present

## 2023-01-22 DIAGNOSIS — M8589 Other specified disorders of bone density and structure, multiple sites: Secondary | ICD-10-CM | POA: Diagnosis not present

## 2023-01-25 DIAGNOSIS — J479 Bronchiectasis, uncomplicated: Secondary | ICD-10-CM | POA: Diagnosis not present

## 2023-01-25 DIAGNOSIS — J42 Unspecified chronic bronchitis: Secondary | ICD-10-CM | POA: Diagnosis not present

## 2023-01-25 DIAGNOSIS — J439 Emphysema, unspecified: Secondary | ICD-10-CM | POA: Diagnosis not present

## 2023-01-25 DIAGNOSIS — J471 Bronchiectasis with (acute) exacerbation: Secondary | ICD-10-CM | POA: Diagnosis not present

## 2023-01-27 DIAGNOSIS — J479 Bronchiectasis, uncomplicated: Secondary | ICD-10-CM | POA: Diagnosis not present

## 2023-01-27 DIAGNOSIS — J471 Bronchiectasis with (acute) exacerbation: Secondary | ICD-10-CM | POA: Diagnosis not present

## 2023-01-27 DIAGNOSIS — J42 Unspecified chronic bronchitis: Secondary | ICD-10-CM | POA: Diagnosis not present

## 2023-01-27 DIAGNOSIS — J439 Emphysema, unspecified: Secondary | ICD-10-CM | POA: Diagnosis not present

## 2023-01-29 DIAGNOSIS — J42 Unspecified chronic bronchitis: Secondary | ICD-10-CM | POA: Diagnosis not present

## 2023-01-29 DIAGNOSIS — J471 Bronchiectasis with (acute) exacerbation: Secondary | ICD-10-CM | POA: Diagnosis not present

## 2023-01-29 DIAGNOSIS — J479 Bronchiectasis, uncomplicated: Secondary | ICD-10-CM | POA: Diagnosis not present

## 2023-01-29 DIAGNOSIS — J439 Emphysema, unspecified: Secondary | ICD-10-CM | POA: Diagnosis not present

## 2023-02-01 DIAGNOSIS — J439 Emphysema, unspecified: Secondary | ICD-10-CM | POA: Diagnosis not present

## 2023-02-01 DIAGNOSIS — J42 Unspecified chronic bronchitis: Secondary | ICD-10-CM | POA: Diagnosis not present

## 2023-02-01 DIAGNOSIS — J479 Bronchiectasis, uncomplicated: Secondary | ICD-10-CM | POA: Diagnosis not present

## 2023-02-01 DIAGNOSIS — J471 Bronchiectasis with (acute) exacerbation: Secondary | ICD-10-CM | POA: Diagnosis not present

## 2023-02-03 DIAGNOSIS — J471 Bronchiectasis with (acute) exacerbation: Secondary | ICD-10-CM | POA: Diagnosis not present

## 2023-02-03 DIAGNOSIS — J439 Emphysema, unspecified: Secondary | ICD-10-CM | POA: Diagnosis not present

## 2023-02-03 DIAGNOSIS — J42 Unspecified chronic bronchitis: Secondary | ICD-10-CM | POA: Diagnosis not present

## 2023-02-03 DIAGNOSIS — J479 Bronchiectasis, uncomplicated: Secondary | ICD-10-CM | POA: Diagnosis not present

## 2023-02-05 DIAGNOSIS — J471 Bronchiectasis with (acute) exacerbation: Secondary | ICD-10-CM | POA: Diagnosis not present

## 2023-02-05 DIAGNOSIS — J439 Emphysema, unspecified: Secondary | ICD-10-CM | POA: Diagnosis not present

## 2023-02-05 DIAGNOSIS — J479 Bronchiectasis, uncomplicated: Secondary | ICD-10-CM | POA: Diagnosis not present

## 2023-02-05 DIAGNOSIS — J42 Unspecified chronic bronchitis: Secondary | ICD-10-CM | POA: Diagnosis not present

## 2023-02-06 DIAGNOSIS — J479 Bronchiectasis, uncomplicated: Secondary | ICD-10-CM | POA: Diagnosis not present

## 2023-02-08 DIAGNOSIS — J479 Bronchiectasis, uncomplicated: Secondary | ICD-10-CM | POA: Diagnosis not present

## 2023-02-08 DIAGNOSIS — J471 Bronchiectasis with (acute) exacerbation: Secondary | ICD-10-CM | POA: Diagnosis not present

## 2023-02-08 DIAGNOSIS — J439 Emphysema, unspecified: Secondary | ICD-10-CM | POA: Diagnosis not present

## 2023-02-08 DIAGNOSIS — J42 Unspecified chronic bronchitis: Secondary | ICD-10-CM | POA: Diagnosis not present

## 2023-02-09 ENCOUNTER — Ambulatory Visit: Payer: Medicare Other | Admitting: Internal Medicine

## 2023-02-09 ENCOUNTER — Other Ambulatory Visit: Payer: Self-pay

## 2023-02-09 ENCOUNTER — Telehealth: Payer: Self-pay | Admitting: Pharmacist

## 2023-02-09 ENCOUNTER — Encounter: Payer: Self-pay | Admitting: Internal Medicine

## 2023-02-09 VITALS — BP 114/73 | Temp 97.5°F | Ht 63.0 in | Wt 101.0 lb

## 2023-02-09 DIAGNOSIS — A310 Pulmonary mycobacterial infection: Secondary | ICD-10-CM | POA: Diagnosis not present

## 2023-02-09 NOTE — Progress Notes (Signed)
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    Priority: High   BRONCHIECTASIS 05/23/2009    Priority: Medium    Protein-calorie malnutrition, severe 09/19/2021   SBO (small bowel obstruction) ([place]) 09/18/2021   Depression 06/27/2020   Dysphonia 01/11/2018   Right shoulder pain 01/21/2017   Encounter for hepatitis C screening test for low risk patient 01/21/2017   GERD (gastroesophageal reflux disease) 02/21/2016   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   ACYCLOVIR (ZOVIRAX) 400 MG TABLET    Take 400 mg by mouth 2 (two) times daily.   ALBUTEROL (VENTOLIN HFA) 108 (90 BASE) MCG/ACT INHALER    Inhale 2 puffs into the lungs every 6 (six) hours as needed for wheezing or shortness of breath.   ALENDRONATE (FOSAMAX) 70 MG TABLET    Take 70 mg by mouth once a week.   AMIKACIN SULFATE LIPOSOME (ARIKAYCE) 590 MG/8.4ML SUSP    every morning. Indications: refractory Mycobacterium avium complex pulmonary disease   ASCORBIC ACID (VITAMIN C) 500 MG TABLET    Take 500 mg by mouth daily.   ATORVASTATIN (LIPITOR) 20 MG TABLET    Take 20 mg by mouth daily.   B COMPLEX VITAMINS (B COMPLEX-B12 PO)    Take 1 tablet by mouth daily.   BENZONATATE (TESSALON PERLES) 100 MG CAPSULE    Take 1 capsule (100 mg total) by mouth 3 (three) times daily as needed for cough.   CALCIUM CARBONATE (OSCAL) 1500 (600 CA) MG TABS TABLET    Take 600 mg of elemental calcium by  mouth 2 (two) times daily with a meal.   CETIRIZINE (ZYRTEC) 10 MG TABLET    Take 10 mg by mouth daily.   CHOLECALCIFEROL (VITAMIN D3) 125 MCG (5000 UT) TABS    Take 1 tablet by mouth daily.   CLOFAZIMINE PO    Take 100 mg by mouth daily.   ETHAMBUTOL (MYAMBUTOL) 400 MG TABLET    Take 2 tablets (800 mg total) by mouth daily.   FLAXSEED, LINSEED, (FLAX SEED OIL) 1300 MG CAPS    Take 1 tablet by mouth daily.   MELATONIN 10 MG CAPS    Take 10 mg by mouth at bedtime.   OMEPRAZOLE (PRILOSEC) 40 MG CAPSULE    Take 1 capsule (40 mg total) by mouth daily.   PROBIOTIC PRODUCT (PROBIOTIC DAILY PO)    Take 1 capsule by mouth daily.   PROPYLENE GLYCOL (SYSTANE BALANCE OP)    Place 1 drop into the left eye daily as needed (dry eyes).   RESPIRATORY THERAPY SUPPLIES (FLUTTER) DEVI    Use as directed.   RIFAMPIN (RIFADIN) 300 MG CAPSULE    Take 2 capsules (600 mg total) by mouth daily.   SPACER/AERO-HOLDING CHAMBERS (AEROCHAMBER MV) INHALER    Use as instructed   TURMERIC PO    Take 2,000 mg by mouth daily.    ZINC GLUCONATE 50  MG TABLET    Take 50 mg by mouth daily.  Modified Medications   No medications on file  Discontinued Medications   No medications on file    Subjective: Kiara Medina is in for her routine follow-up visit.  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  She has been on clofazimine, ethambutol and rifampin ever since September 2019.  She has been on and off aerosolized amikacin but stopped it for a second time last July when she felt like it was actually making her feel worse.  She is using a vibratory vest.  She is no longer using aerosolized saline.  She says that when she called to get a refill and she was told that they could not get it but she is not sure what that means.  She has been fully vaccinated against pneumococcal infection, COVID and RSV.  Her last COVID-vaccine was in December of last year.  She developed symptomatic COVID infection in late  January and was treated with molnupiravir.  She felt a little bit better after treatment but has been more fatigued than usual.  She still is very much bothered by coughing, especially in the morning.  She has not had any episodes of hemoptysis.  Her dyspnea is unchanged.  Her appetite remains poor but she has not been losing weight.  Review of Systems: Review of Systems  Constitutional:  Positive for malaise/fatigue. Negative for chills, diaphoresis, fever and weight loss.  Respiratory:  Positive for cough, sputum production, shortness of breath and wheezing. Negative for hemoptysis.     Past Medical History:  Diagnosis Date   Allergic rhinitis, cause unspecified    Bronchiectasis without acute exacerbation (HCC)    Cough    Sinusitis     Social History   Tobacco Use   Smoking status: Former    Packs/day: 1.00    Years: 20.00    Total pack years: 20.00    Types: Cigarettes    Quit date: 12/14/1985    Years since quitting: 37.1   Smokeless tobacco: Never  Vaping Use   Vaping Use: Never used  Substance Use Topics   Alcohol use: Not Currently    Alcohol/week: 1.0 standard drink of alcohol    Types: 1 Glasses of wine per week    Comment: occ   Drug use: No    Family History  Problem Relation Age of Onset   Emphysema Sister    Stroke Mother    Clotting disorder Mother     No Known Allergies  Objective: Vitals:   02/09/23 1502  BP: 114/73  Temp: (!) 97.5 F (36.4 C)  TempSrc: Temporal  SpO2: 98%  Weight: 101 lb (45.8 kg)  Height: '5\' 3"'$  (1.6 m)   Body mass index is 17.89 kg/m.  Physical Exam Constitutional:      Comments: She has diffuse "tanning" of her skin due to clofazimine use.  She is very thin.  She is pleasant as usual.  Cardiovascular:     Rate and Rhythm: Normal rate and regular rhythm.     Heart sounds: No murmur heard. Pulmonary:     Effort: Pulmonary effort is normal.     Breath sounds: Rales present. No wheezing or rhonchi.  Psychiatric:         Mood and Affect: Mood normal.     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  Her best hope is to try to manage her underlying bronchiectasis and continue her current 3 drug oral antibiotic regimen with the hopes that it will slow progression of her infection.  I asked her to check with Dr. Silas Flood, her pulmonologist, about the issue of aerosolized saline.  She will follow-up here in 2 months before she leaves on a trip to the [place] with her daughter.        Michel Bickers, MD [facility] [phone] pager   [phone] cell 02/09/2023, 3:35 PM

## 2023-02-09 NOTE — Assessment & Plan Note (Signed)
She is aware that there is no cure for her Mycobacterium avium infection.  Her best hope is to try to manage her underlying bronchiectasis and continue her current 3 drug oral antibiotic regimen with the hopes that it will slow progression of her infection.  I asked her to check with Dr. Silas Flood, her pulmonologist, about the issue of aerosolized saline.  She will follow-up here in 2 months before she leaves on a trip to the [place] with her daughter.

## 2023-02-09 NOTE — Telephone Encounter (Signed)
Patient picked up 2 bottles of clofazimine on 02/09/23. Supply should last for ~3 months. Next refill due around end of May/beginning of June.  Raelynne Ludwick L. Amos Micheals, PharmD, BCIDP, [place], CPP Clinical Pharmacist Practitioner Infectious Diseases [facility] 02/09/2023, 3:49 PM

## 2023-02-10 DIAGNOSIS — J471 Bronchiectasis with (acute) exacerbation: Secondary | ICD-10-CM | POA: Diagnosis not present

## 2023-02-10 DIAGNOSIS — J439 Emphysema, unspecified: Secondary | ICD-10-CM | POA: Diagnosis not present

## 2023-02-10 DIAGNOSIS — J42 Unspecified chronic bronchitis: Secondary | ICD-10-CM | POA: Diagnosis not present

## 2023-02-10 DIAGNOSIS — J479 Bronchiectasis, uncomplicated: Secondary | ICD-10-CM | POA: Diagnosis not present

## 2023-02-11 ENCOUNTER — Ambulatory Visit: Payer: Self-pay | Admitting: Internal Medicine

## 2023-02-12 DIAGNOSIS — J439 Emphysema, unspecified: Secondary | ICD-10-CM | POA: Diagnosis not present

## 2023-02-12 DIAGNOSIS — J479 Bronchiectasis, uncomplicated: Secondary | ICD-10-CM | POA: Diagnosis not present

## 2023-02-15 DIAGNOSIS — J479 Bronchiectasis, uncomplicated: Secondary | ICD-10-CM | POA: Diagnosis not present

## 2023-02-15 DIAGNOSIS — J439 Emphysema, unspecified: Secondary | ICD-10-CM | POA: Diagnosis not present

## 2023-02-18 DIAGNOSIS — E559 Vitamin D deficiency, unspecified: Secondary | ICD-10-CM | POA: Diagnosis not present

## 2023-02-18 DIAGNOSIS — K219 Gastro-esophageal reflux disease without esophagitis: Secondary | ICD-10-CM | POA: Diagnosis not present

## 2023-02-18 DIAGNOSIS — E785 Hyperlipidemia, unspecified: Secondary | ICD-10-CM | POA: Diagnosis not present

## 2023-02-18 DIAGNOSIS — R7301 Impaired fasting glucose: Secondary | ICD-10-CM | POA: Diagnosis not present

## 2023-02-19 DIAGNOSIS — J439 Emphysema, unspecified: Secondary | ICD-10-CM | POA: Diagnosis not present

## 2023-02-19 DIAGNOSIS — J479 Bronchiectasis, uncomplicated: Secondary | ICD-10-CM | POA: Diagnosis not present

## 2023-02-22 ENCOUNTER — Encounter: Payer: Self-pay | Admitting: Pulmonary Disease

## 2023-02-22 ENCOUNTER — Ambulatory Visit: Payer: Medicare Other | Admitting: Pulmonary Disease

## 2023-02-22 VITALS — BP 118/62 | HR 83 | Wt 100.8 lb

## 2023-02-22 DIAGNOSIS — J439 Emphysema, unspecified: Secondary | ICD-10-CM | POA: Diagnosis not present

## 2023-02-22 DIAGNOSIS — J479 Bronchiectasis, uncomplicated: Secondary | ICD-10-CM

## 2023-02-22 DIAGNOSIS — J302 Other seasonal allergic rhinitis: Secondary | ICD-10-CM

## 2023-02-22 MED ORDER — BREZTRI AEROSPHERE 160-9-4.8 MCG/ACT IN AERO: 2.0000 | INHALATION_SPRAY | Freq: Two times a day (BID) | RESPIRATORY_TRACT | 0 refills | Status: DC

## 2023-02-22 MED ORDER — HYDROCODONE BIT-HOMATROP MBR 5-1.5 MG/5ML PO SOLN: 5.0000 mL | Freq: Every evening | ORAL | 0 refills | Status: DC | PRN

## 2023-02-22 NOTE — Patient Instructions (Addendum)
Nice to see you again  Try Breztri 2 puffs in the morning and 2 puffs in the evening, rinse your mouth thoroughly and spit out water after every use  Send a message in a couple weeks breathing this is helping the cough and/or the shortness of breath and I can prescribe it.  It is too expensive, we can get you some samples for your trip to have with you  I provided sample inhalers, each inhaler last 7 days  I prescribed a supply of cough syrup.  Only use in the evenings.  Okay to use on the plane.  If you find it makes you too sleepy then I would advise you do not take it during the day on your trip so that you can enjoy yourself is much as possible and not be too sleepy.  Use on the plane, you will likely need a 3 ounce or smaller container to stored in on your carry on luggage, the larger supply could go in your checked bag.  You also need some device to measure 5 mL which is the dose.  Return to clinic in 3 months or sooner as needed with Dr. Silas Flood

## 2023-02-22 NOTE — Progress Notes (Signed)
Synopsis: Referred in 2010 for bronchiectasis by Nicoletta Dress, MD.  Previously patient of Dr. Normajean Baxter and Dr. Lake Bells and Dr. Carlis Abbott.  Subjective:   PATIENT ID: Kiara Merle GENDER: female DOB: Aug 09, 1946, MRN: [id number]  Chief Complaint  Patient presents with   Follow-up    Pt is here for follow up bronchiectasis. Pt states when the weather gets like this her breathing is getting worse and she gets SOB. When she lays down at night she has a dry horrible cough. She states it last 5-10 mins and also noted in the morning too.    Kiara Medina is a 77 y.o. woman with a history of MDR MAC and bronchiectasis who returns for routine follow-up.  Cough continues to be an issue.  Baseline worse overall.  She still thinks the vest has been helpful.  Cough throughout the day seems improved but in the mornings and continues to be an issue.  Productive of phlegm.  Also bit more short of breath.  Trialed different headaches in the past without much help.  Discussed role and rationale for trying additional things in the future to help with day-to-day symptoms of cough as well as shortness of breath.  Past Medical History:  Diagnosis Date   Allergic rhinitis, cause unspecified    Bronchiectasis without acute exacerbation (HCC)    Cough    Sinusitis      Family History  Problem Relation Age of Onset   Emphysema Sister    Stroke Mother    Clotting disorder Mother      Past Surgical History:  Procedure Laterality Date   APPENDECTOMY     CATARACT EXTRACTION  08/2013   CHOLECYSTECTOMY     TUBAL LIGATION     VESICOVAGINAL FISTULA CLOSURE W/ TAH      Social History   Socioeconomic History   Marital status: Widowed    Spouse name: Not on file   Number of children: Not on file   Years of education: Not on file   Highest education level: Not on file  Occupational History   Occupation: realtor  Tobacco Use   Smoking status: Former    Packs/day: 1.00    Years: 20.00    Total pack years:  20.00    Types: Cigarettes    Quit date: 12/14/1985    Years since quitting: 37.2   Smokeless tobacco: Never  Vaping Use   Vaping Use: Never used  Substance and Sexual Activity   Alcohol use: Not Currently    Alcohol/week: 1.0 standard drink of alcohol    Types: 1 Glasses of wine per week    Comment: occ   Drug use: No   Sexual activity: Not on file  Other Topics Concern   Not on file  Social History Narrative   Not on file   Social Determinants of Health   Financial Resource Strain: Not on file  Food Insecurity: Not on file  Transportation Needs: Not on file  Physical Activity: Not on file  Stress: Not on file  Social Connections: Not on file  Intimate Partner Violence: Not on file     No Known Allergies   Immunization History  Administered Date(s) Administered   Fluad Quad(high Dose 65+) 09/24/2020   Influenza Split 09/13/2012, 09/06/2014   Influenza Whole 09/09/2009, 08/14/2010, 09/14/2011   Influenza, High Dose Seasonal PF 10/10/2015, 09/02/2021   Influenza,inj,Quad PF,6+ Mos 09/13/2013, 09/21/2016, 08/26/2017, 08/22/2018   Influenza-Unspecified 08/29/2019, 09/21/2022   Moderna Sars-Covid-2 Vaccination 01/03/2020, 01/31/2020, 08/02/2020, 05/06/2021  Pfizer Covid-19 Vaccine Bivalent Booster 5y-11y 11/24/2021   Pneumococcal Conjugate-13 11/24/2014   Pneumococcal Polysaccharide-23 07/17/2009   RSV,unspecified 10/30/2022    Outpatient Medications Prior to Visit  Medication Sig Dispense Refill   acetaminophen (TYLENOL) 500 MG tablet Take 1,000 mg by mouth every 6 (six) hours as needed for moderate pain.     acyclovir (ZOVIRAX) 400 MG tablet Take 400 mg by mouth 2 (two) times daily.     albuterol (VENTOLIN HFA) 108 (90 Base) MCG/ACT inhaler Inhale 2 puffs into the lungs every 6 (six) hours as needed for wheezing or shortness of breath. 18 Medina 3   alendronate (FOSAMAX) 70 MG tablet Take 70 mg by mouth once a week.     ascorbic acid (VITAMIN C) 500 MG tablet Take 500 mg  by mouth daily.     atorvastatin (LIPITOR) 20 MG tablet Take 20 mg by mouth daily.     B Complex Vitamins (B COMPLEX-B12 PO) Take 1 tablet by mouth daily.     benzonatate (TESSALON PERLES) 100 MG capsule Take 1 capsule (100 mg total) by mouth 3 (three) times daily as needed for cough. 270 capsule 3   calcium carbonate (OSCAL) 1500 (600 Ca) MG TABS tablet Take 600 mg of elemental calcium by mouth 2 (two) times daily with a meal.     Cholecalciferol (VITAMIN D3) 125 MCG (5000 UT) TABS Take 1 tablet by mouth daily.     CLOFAZIMINE PO Take 100 mg by mouth daily.     ethambutol (MYAMBUTOL) 400 MG tablet Take 2 tablets (800 mg total) by mouth daily. 180 tablet 3   Flaxseed, Linseed, (FLAX SEED OIL) 1300 MG CAPS Take 1 tablet by mouth daily.     Melatonin 10 MG CAPS Take 10 mg by mouth at bedtime.     omeprazole (PRILOSEC) 40 MG capsule Take 1 capsule (40 mg total) by mouth daily. 90 capsule 3   Probiotic Product (PROBIOTIC DAILY PO) Take 1 capsule by mouth daily.     Propylene Glycol (SYSTANE BALANCE OP) Place 1 drop into the left eye daily as needed (dry eyes).     Respiratory Therapy Supplies (FLUTTER) DEVI Use as directed. 1 each 0   rifampin (RIFADIN) 300 MG capsule Take 2 capsules (600 mg total) by mouth daily. 180 capsule 3   Spacer/Aero-Holding Chambers (AEROCHAMBER MV) inhaler Use as instructed 1 each 2   TURMERIC PO Take 2,000 mg by mouth daily.      Amikacin Sulfate Liposome (ARIKAYCE) 590 MG/8.4ML SUSP every morning. Indications: refractory Mycobacterium avium complex pulmonary disease     cetirizine (ZYRTEC) 10 MG tablet Take 10 mg by mouth daily.     zinc gluconate 50 MG tablet Take 50 mg by mouth daily.     No facility-administered medications prior to visit.    Review of Systems  N/a   Objective:   Vitals:   02/22/23 1500  BP: 118/62  Weight: 100 lb 12.8 oz (45.7 kg)        on   RA BMI Readings from Last 3 Encounters:  02/22/23 17.86 kg/m  02/09/23 17.89 kg/m   12/30/22 17.93 kg/m   Wt Readings from Last 3 Encounters:  02/22/23 100 lb 12.8 oz (45.7 kg)  02/09/23 101 lb (45.8 kg)  12/30/22 101 lb 3.2 oz (45.9 kg)    Physical Exam BP 118/62 (BP Location: Left Arm, Cuff Size: Normal)   Wt 100 lb 12.8 oz (45.7 kg)   BMI 17.86 kg/m  General: Well-appearing  in no acute distress, thin Eyes: EOMI, icterus Respiratory: Clear, no wheeze, normal work of breathing Cardiovascular: Regular rhythm, no murmur Extremities: Warm, no edema     CBC    Component Value Date/Time   WBC 7.3 05/19/2022 1559   RBC 4.35 05/19/2022 1559   HGB 12.6 05/19/2022 1559   HCT 39.4 05/19/2022 1559   PLT 242 05/19/2022 1559   MCV 90.6 05/19/2022 1559   MCH 29.0 05/19/2022 1559   MCHC 32.0 05/19/2022 1559   RDW 14.3 05/19/2022 1559   LYMPHSABS 1.3 09/24/2021 0519   MONOABS 0.5 09/24/2021 0519   EOSABS 0.2 09/24/2021 0519   BASOSABS 0.0 09/24/2021 0519    CHEMISTRY No results for input(s): "NA", "K", "CL", "CO2", "GLUCOSE", "BUN", "CREATININE", "CALCIUM", "MG", "PHOS" in the last 168 hours. CrCl cannot be calculated (Patient's most recent lab result is older than the maximum 21 days allowed.).  12/28/2019: IgG 954 IgM 96 IgA 289 IgE 10 IgG subclasses normal (other than increased IgG subclass 3)  Cultures: 3/10322 - pending 11/23/2019-MAI 05/17/2019-MAI 03/15/2019-MAI 01/10/2019 MAI 11/07/2018-MAI 10/10/2018-MAI  08/22/2018-MAI 07/11/2018-MAI 05/31/2018-MAI MAI susceptibilities-resistant clarithromycin, linezolid.  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  Susceptible to amikacin, cefepime, clarithromycin, imipenem, linezolid, trimethoprim sulfamethoxazole. 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  Tree-in-bud opacities diffusely.  Mucus impaction in airways.  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     Assessment & Plan:   No diagnosis found.      Dyspnea on exertion likely due to chronic bronchiectasis and asthma-improved on LAMA/LABA; worsed when back off these meds. Component of deconditioning.  Has seasonal allergies, describes bronchospasm. 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  No obvious immunodeficiencies.  Progressive on CT over the last 6 years.  No evidence of hypersensitivity pneumonitis from inhaled amikacin on CT scan.  - Hypertonic saline to twice a day  -- Continue vest therapy -Continue current multi drug MAI therapy and ID follow-up.  I agree with Dr. Megan Salon that she has limited treatment options and would likely progress faster off meds --stopped inhaled amikacin once again 06/2022 due to worsening cough -CT chest hi res 02/2022 with worsened signs of NTM disease -- Small supply of Hycodan cough syrup to have for her upcoming international trip to help with cough on the plane, other social situations, she was advised not to drive after taking, only to take on the plane and prior to sleep she can rest at night  Weight loss- concerned this may be a MAC  secondary effect, complicated by fatigue and DOE.  Related to metabolic needs from dyspnea, cough.  Poor appetite.  This is ongoing. Weight overall stable.  RTC in 3 months  with Dr. Silas Flood.     Current Outpatient Medications:    acetaminophen (TYLENOL) 500 MG tablet, Take 1,000 mg by mouth every 6 (six) hours as needed for moderate pain., Disp: , Rfl:    acyclovir (ZOVIRAX) 400 MG tablet, Take 400 mg by mouth 2 (two) times daily., Disp: , Rfl:    albuterol (VENTOLIN HFA) 108 (90 Base) MCG/ACT inhaler, Inhale 2 puffs into the lungs every 6 (six) hours as needed for wheezing or shortness of breath., Disp: 18 Medina, Rfl: 3   alendronate (FOSAMAX) 70 MG tablet, Take 70 mg by mouth once a week., Disp: , Rfl:    ascorbic acid (VITAMIN C) 500 MG tablet, Take 500 mg by mouth daily., Disp: , Rfl:    atorvastatin (LIPITOR) 20 MG tablet, Take 20 mg by mouth daily., Disp: , Rfl:    B Complex Vitamins (B COMPLEX-B12 PO), Take 1 tablet by mouth daily., Disp: , Rfl:    benzonatate (TESSALON PERLES) 100 MG capsule, Take 1 capsule (100 mg total) by mouth 3 (three) times daily as needed for cough., Disp: 270 capsule, Rfl: 3   Budeson-Glycopyrrol-Formoterol (BREZTRI AEROSPHERE) 160-9-4.8 MCG/ACT AERO, Inhale 2 puffs into the lungs in the morning and at bedtime., Disp: 1 each, Rfl: 0   calcium carbonate (OSCAL) 1500 (600 Ca) MG TABS tablet, Take 600 mg of elemental calcium by mouth 2 (two) times daily with a meal., Disp: , Rfl:    Cholecalciferol (VITAMIN D3) 125 MCG (5000 UT) TABS, Take 1 tablet by mouth daily., Disp: , Rfl:    CLOFAZIMINE PO, Take 100 mg by mouth daily., Disp: , Rfl:    ethambutol (MYAMBUTOL) 400 MG tablet, Take 2 tablets (800 mg total) by mouth daily., Disp: 180 tablet, Rfl: 3   Flaxseed, Linseed, (FLAX SEED OIL) 1300 MG CAPS, Take 1 tablet by mouth daily., Disp: , Rfl:    HYDROcodone bit-homatropine (HYCODAN) 5-1.5 MG/5ML syrup, Take 5 mLs by mouth at bedtime as needed for cough., Disp: 240 mL,  Rfl: 0   Melatonin 10 MG CAPS, Take 10 mg by mouth at bedtime., Disp: , Rfl:    omeprazole (PRILOSEC) 40 MG capsule, Take 1 capsule (40 mg total) by mouth daily., Disp: 90 capsule, Rfl: 3   Probiotic Product (PROBIOTIC DAILY PO), Take 1 capsule by mouth daily., Disp: , Rfl:    Propylene Glycol (SYSTANE BALANCE OP), Place 1 drop into the left eye daily as needed (dry eyes)., Disp: , Rfl:    Respiratory Therapy Supplies (FLUTTER) DEVI, Use as directed., Disp: 1 each, Rfl: 0   rifampin (RIFADIN) 300 MG capsule, Take 2 capsules (600 mg total) by mouth daily., Disp: 180 capsule, Rfl: 3   Spacer/Aero-Holding Chambers (AEROCHAMBER MV) inhaler, Use as instructed, Disp: 1 each, Rfl: 2   TURMERIC PO, Take 2,000 mg by mouth daily. , Disp: , Rfl:

## 2023-02-23 ENCOUNTER — Telehealth: Payer: Self-pay

## 2023-02-23 ENCOUNTER — Other Ambulatory Visit (HOSPITAL_COMMUNITY): Payer: Self-pay

## 2023-02-23 NOTE — Telephone Encounter (Signed)
I am not sure but I will look and see if I can do it in covermy meds. I tried running a claim at [employer] to see if it was cheaper but I will have to wait until 03/07/23 to try and run a claim.

## 2023-02-23 NOTE — Telephone Encounter (Signed)
Patient called, states her ethambutol copay has gone up to $90 for a 90-day supply. Says it is now considered tier 3 and is asking that our office call [employer] to request a tier reduction.   Beryle Flock, RN

## 2023-02-24 DIAGNOSIS — J479 Bronchiectasis, uncomplicated: Secondary | ICD-10-CM | POA: Diagnosis not present

## 2023-02-24 DIAGNOSIS — J439 Emphysema, unspecified: Secondary | ICD-10-CM | POA: Diagnosis not present

## 2023-02-24 DIAGNOSIS — M13841 Other specified arthritis, right hand: Secondary | ICD-10-CM | POA: Diagnosis not present

## 2023-02-24 DIAGNOSIS — R52 Pain, unspecified: Secondary | ICD-10-CM | POA: Diagnosis not present

## 2023-02-24 DIAGNOSIS — M1811 Unilateral primary osteoarthritis of first carpometacarpal joint, right hand: Secondary | ICD-10-CM | POA: Diagnosis not present

## 2023-02-24 NOTE — Telephone Encounter (Signed)
1 

## 2023-02-26 DIAGNOSIS — J479 Bronchiectasis, uncomplicated: Secondary | ICD-10-CM | POA: Diagnosis not present

## 2023-02-26 DIAGNOSIS — J439 Emphysema, unspecified: Secondary | ICD-10-CM | POA: Diagnosis not present

## 2023-03-01 DIAGNOSIS — J439 Emphysema, unspecified: Secondary | ICD-10-CM | POA: Diagnosis not present

## 2023-03-01 DIAGNOSIS — J479 Bronchiectasis, uncomplicated: Secondary | ICD-10-CM | POA: Diagnosis not present

## 2023-03-03 DIAGNOSIS — J439 Emphysema, unspecified: Secondary | ICD-10-CM | POA: Diagnosis not present

## 2023-03-03 DIAGNOSIS — J479 Bronchiectasis, uncomplicated: Secondary | ICD-10-CM | POA: Diagnosis not present

## 2023-03-05 DIAGNOSIS — J479 Bronchiectasis, uncomplicated: Secondary | ICD-10-CM | POA: Diagnosis not present

## 2023-03-05 DIAGNOSIS — J439 Emphysema, unspecified: Secondary | ICD-10-CM | POA: Diagnosis not present

## 2023-03-07 DIAGNOSIS — J479 Bronchiectasis, uncomplicated: Secondary | ICD-10-CM | POA: Diagnosis not present

## 2023-03-08 DIAGNOSIS — J479 Bronchiectasis, uncomplicated: Secondary | ICD-10-CM | POA: Diagnosis not present

## 2023-03-08 DIAGNOSIS — J439 Emphysema, unspecified: Secondary | ICD-10-CM | POA: Diagnosis not present

## 2023-03-09 DIAGNOSIS — J439 Emphysema, unspecified: Secondary | ICD-10-CM | POA: Diagnosis not present

## 2023-03-09 DIAGNOSIS — J479 Bronchiectasis, uncomplicated: Secondary | ICD-10-CM | POA: Diagnosis not present

## 2023-03-10 DIAGNOSIS — J439 Emphysema, unspecified: Secondary | ICD-10-CM | POA: Diagnosis not present

## 2023-03-10 DIAGNOSIS — J479 Bronchiectasis, uncomplicated: Secondary | ICD-10-CM | POA: Diagnosis not present

## 2023-03-11 ENCOUNTER — Other Ambulatory Visit (HOSPITAL_COMMUNITY): Payer: Self-pay

## 2023-03-11 NOTE — Telephone Encounter (Signed)
Patient Advocate Encounter   Received notification that tier exception for Ethambutol HCl 400MG  tablets is required.   Tier Exception submitted on 03/11/2023 [place] Electronic Request Form Status is pending       Lyndel Safe, [place] Patient Advocate Specialist [place] Patient Advocate Team Direct Number: [phone]  Fax: [phone]

## 2023-03-11 NOTE — Telephone Encounter (Signed)
Per [name]  A tier exception has been submitted Kiara Medina put a note in the chart about it) - he said if he doesn't hear anything by the end of the day today, he will let us know on Monday. Patient has been informed that we will follow up with her on Monday. Kiara Medina [name]

## 2023-03-15 DIAGNOSIS — J479 Bronchiectasis, uncomplicated: Secondary | ICD-10-CM | POA: Diagnosis not present

## 2023-03-15 DIAGNOSIS — J439 Emphysema, unspecified: Secondary | ICD-10-CM | POA: Diagnosis not present

## 2023-03-15 DIAGNOSIS — J471 Bronchiectasis with (acute) exacerbation: Secondary | ICD-10-CM | POA: Diagnosis not present

## 2023-03-15 NOTE — Telephone Encounter (Signed)
Kiara Medina informed me this morning that the tier exception was denied. Will follow up on this once Kiara Medina returns. In the meantime, how many days supply does Kiara Medina have for ethambutol again? From what I can see on the dispense report, she should be out by now. She can either pay the $90 for this 90-day supply or I think we have maybe a one-month supply of samples in the office? - Estill Bamberg

## 2023-03-16 NOTE — Telephone Encounter (Signed)
Attempted to reach the patient to follow up on her medication. Patient did not answer and a VM was left for her to call back.  Please see message below from [place] if the patient calls back.  Samarth Ogle [name]

## 2023-03-16 NOTE — Telephone Encounter (Signed)
I spoke to the patient and she reports she still has a few days left on the ethambutol before she runs out. Patient advised we are still working on coverage for her medication

## 2023-03-17 ENCOUNTER — Other Ambulatory Visit (HOSPITAL_COMMUNITY): Payer: Self-pay

## 2023-03-19 DIAGNOSIS — J479 Bronchiectasis, uncomplicated: Secondary | ICD-10-CM | POA: Diagnosis not present

## 2023-03-19 DIAGNOSIS — J439 Emphysema, unspecified: Secondary | ICD-10-CM | POA: Diagnosis not present

## 2023-03-19 DIAGNOSIS — J471 Bronchiectasis with (acute) exacerbation: Secondary | ICD-10-CM | POA: Diagnosis not present

## 2023-03-20 ENCOUNTER — Other Ambulatory Visit: Payer: Self-pay | Admitting: Internal Medicine

## 2023-03-20 DIAGNOSIS — A310 Pulmonary mycobacterial infection: Secondary | ICD-10-CM

## 2023-03-22 DIAGNOSIS — J439 Emphysema, unspecified: Secondary | ICD-10-CM | POA: Diagnosis not present

## 2023-03-22 DIAGNOSIS — J471 Bronchiectasis with (acute) exacerbation: Secondary | ICD-10-CM | POA: Diagnosis not present

## 2023-03-22 DIAGNOSIS — J479 Bronchiectasis, uncomplicated: Secondary | ICD-10-CM | POA: Diagnosis not present

## 2023-03-24 DIAGNOSIS — J439 Emphysema, unspecified: Secondary | ICD-10-CM | POA: Diagnosis not present

## 2023-03-24 DIAGNOSIS — J479 Bronchiectasis, uncomplicated: Secondary | ICD-10-CM | POA: Diagnosis not present

## 2023-03-24 DIAGNOSIS — J471 Bronchiectasis with (acute) exacerbation: Secondary | ICD-10-CM | POA: Diagnosis not present

## 2023-03-24 NOTE — Telephone Encounter (Signed)
Unfortunately, confirmed with Lupita Leash after a few checks that patient will have to pay for ethambutol. Let me know if you need anything else from [place]. Marchelle Folks

## 2023-03-24 NOTE — Telephone Encounter (Signed)
Patient informed. 

## 2023-03-24 NOTE — Telephone Encounter (Signed)
Patient informed and verbalized understanding. Patient would like to see if she can get the bottle we have here at the clinic as well

## 2023-03-24 NOTE — Telephone Encounter (Signed)
We had it but found out it was expired :( - Kiara Medina

## 2023-03-25 ENCOUNTER — Telehealth: Payer: Self-pay | Admitting: Pulmonary Disease

## 2023-03-25 ENCOUNTER — Other Ambulatory Visit: Payer: Self-pay | Admitting: Internal Medicine

## 2023-03-25 DIAGNOSIS — A310 Pulmonary mycobacterial infection: Secondary | ICD-10-CM

## 2023-03-25 MED ORDER — RIFAMPIN 300 MG PO CAPS: 600.0000 mg | ORAL_CAPSULE | Freq: Every day | ORAL | 3 refills | Status: DC

## 2023-03-25 NOTE — Telephone Encounter (Signed)
Pt. Calling about picking up samples of [place] before she goes on trip and wants to speak to the nurse to make sure it will be done before she leaves

## 2023-03-26 ENCOUNTER — Other Ambulatory Visit: Payer: Self-pay | Admitting: *Deleted

## 2023-03-26 DIAGNOSIS — J439 Emphysema, unspecified: Secondary | ICD-10-CM | POA: Diagnosis not present

## 2023-03-26 DIAGNOSIS — J479 Bronchiectasis, uncomplicated: Secondary | ICD-10-CM | POA: Diagnosis not present

## 2023-03-26 DIAGNOSIS — J471 Bronchiectasis with (acute) exacerbation: Secondary | ICD-10-CM | POA: Diagnosis not present

## 2023-03-26 MED ORDER — BREZTRI AEROSPHERE 160-9-4.8 MCG/ACT IN AERO
2.0000 | INHALATION_SPRAY | Freq: Two times a day (BID) | RESPIRATORY_TRACT | 0 refills | Status: DC
Start: 2023-03-26 — End: 2023-04-28

## 2023-03-26 NOTE — Telephone Encounter (Signed)
Called and spoke with patient, she states she is almost out of [place] and is going on a trip and was asking for samples to take with her so she can decide if she is going to have it filled when she returns.  She states she will come on Wednesday when she is in town to pick them up.  I advised her to call her insurance company to see if it is covered, she stated she has, that is how she knows it is expensive.  2 samples put up front.  Nothing further needed.

## 2023-03-26 NOTE — Telephone Encounter (Signed)
Patient checking on message for Breztri samples. Patient phone number is [phone].

## 2023-03-29 DIAGNOSIS — J479 Bronchiectasis, uncomplicated: Secondary | ICD-10-CM | POA: Diagnosis not present

## 2023-03-29 DIAGNOSIS — J471 Bronchiectasis with (acute) exacerbation: Secondary | ICD-10-CM | POA: Diagnosis not present

## 2023-03-29 DIAGNOSIS — J439 Emphysema, unspecified: Secondary | ICD-10-CM | POA: Diagnosis not present

## 2023-03-31 DIAGNOSIS — J471 Bronchiectasis with (acute) exacerbation: Secondary | ICD-10-CM | POA: Diagnosis not present

## 2023-03-31 DIAGNOSIS — J439 Emphysema, unspecified: Secondary | ICD-10-CM | POA: Diagnosis not present

## 2023-03-31 DIAGNOSIS — J479 Bronchiectasis, uncomplicated: Secondary | ICD-10-CM | POA: Diagnosis not present

## 2023-04-02 DIAGNOSIS — J479 Bronchiectasis, uncomplicated: Secondary | ICD-10-CM | POA: Diagnosis not present

## 2023-04-02 DIAGNOSIS — J471 Bronchiectasis with (acute) exacerbation: Secondary | ICD-10-CM | POA: Diagnosis not present

## 2023-04-02 DIAGNOSIS — J439 Emphysema, unspecified: Secondary | ICD-10-CM | POA: Diagnosis not present

## 2023-04-05 DIAGNOSIS — J439 Emphysema, unspecified: Secondary | ICD-10-CM | POA: Diagnosis not present

## 2023-04-05 DIAGNOSIS — J479 Bronchiectasis, uncomplicated: Secondary | ICD-10-CM | POA: Diagnosis not present

## 2023-04-05 DIAGNOSIS — J471 Bronchiectasis with (acute) exacerbation: Secondary | ICD-10-CM | POA: Diagnosis not present

## 2023-04-06 ENCOUNTER — Encounter: Payer: Self-pay | Admitting: Internal Medicine

## 2023-04-06 ENCOUNTER — Ambulatory Visit: Payer: Medicare Other | Admitting: Internal Medicine

## 2023-04-06 ENCOUNTER — Other Ambulatory Visit: Payer: Self-pay

## 2023-04-06 VITALS — BP 106/62 | HR 74 | Temp 98.4°F | Ht 63.0 in | Wt 100.0 lb

## 2023-04-06 DIAGNOSIS — A310 Pulmonary mycobacterial infection: Secondary | ICD-10-CM

## 2023-04-06 DIAGNOSIS — J479 Bronchiectasis, uncomplicated: Secondary | ICD-10-CM | POA: Diagnosis not present

## 2023-04-06 DIAGNOSIS — J439 Emphysema, unspecified: Secondary | ICD-10-CM | POA: Diagnosis not present

## 2023-04-06 DIAGNOSIS — J471 Bronchiectasis with (acute) exacerbation: Secondary | ICD-10-CM | POA: Diagnosis not present

## 2023-04-06 NOTE — Assessment & Plan Note (Signed)
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 Her best hope is to try to manage her underlying bronchiectasis and continue her current 3 drug oral antibiotic regimen with the hopes that it will slow progression of her infection.

## 2023-04-06 NOTE — Progress Notes (Signed)
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    Priority: High   BRONCHIECTASIS 05/23/2009    Priority: Medium    Protein-calorie malnutrition, severe 09/19/2021   SBO (small bowel obstruction) 09/18/2021   Depression 06/27/2020   Dysphonia 01/11/2018   Right shoulder pain 01/21/2017   Encounter for hepatitis C screening test for low risk patient 01/21/2017   GERD (gastroesophageal reflux disease) 02/21/2016   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   ACYCLOVIR (ZOVIRAX) 400 MG TABLET    Take 400 mg by mouth 2 (two) times daily.   ALBUTEROL (VENTOLIN HFA) 108 (90 BASE) MCG/ACT INHALER    Inhale 2 puffs into the lungs every 6 (six) hours as needed for wheezing or shortness of breath.   ALENDRONATE (FOSAMAX) 70 MG TABLET    Take 70 mg by mouth once a week.   ASCORBIC ACID (VITAMIN C) 500 MG TABLET    Take 500 mg by mouth daily.   ATORVASTATIN (LIPITOR) 20 MG TABLET    Take 20 mg by mouth daily.   B COMPLEX VITAMINS (B COMPLEX-B12 PO)    Take 1 tablet by mouth daily.   BENZONATATE (TESSALON PERLES) 100 MG CAPSULE    Take 1 capsule (100 mg total) by mouth 3 (three) times daily as needed for cough.   BUDESON-GLYCOPYRROL-FORMOTEROL (BREZTRI AEROSPHERE) 160-9-4.8 MCG/ACT AERO    Inhale 2 puffs into the lungs in the morning and at bedtime.   BUDESON-GLYCOPYRROL-FORMOTEROL (BREZTRI AEROSPHERE) 160-9-4.8 MCG/ACT AERO    Inhale 2 puffs into the lungs in the  morning and at bedtime.   CALCIUM CARBONATE (OSCAL) 1500 (600 CA) MG TABS TABLET    Take 600 mg of elemental calcium by mouth 2 (two) times daily with a meal.   CHOLECALCIFEROL (VITAMIN D3) 125 MCG (5000 UT) TABS    Take 1 tablet by mouth daily.   CLOFAZIMINE PO    Take 100 mg by mouth daily.   ETHAMBUTOL (MYAMBUTOL) 400 MG TABLET    TAKE 2 TABLETS DAILY   FLAXSEED, LINSEED, (FLAX SEED OIL) 1300 MG CAPS    Take 1 tablet by mouth daily.   HYDROCODONE BIT-HOMATROPINE (HYCODAN) 5-1.5 MG/5ML SYRUP    Take 5 mLs by mouth at bedtime as needed for cough.   MELATONIN 10 MG CAPS    Take 10 mg by mouth at bedtime.   OMEPRAZOLE (PRILOSEC) 40 MG CAPSULE    Take 1 capsule (40 mg total) by mouth daily.   PROBIOTIC PRODUCT (PROBIOTIC DAILY PO)    Take 1 capsule by mouth daily.   PROPYLENE GLYCOL (SYSTANE BALANCE OP)    Place 1 drop into the left eye daily as needed (dry eyes).   RESPIRATORY THERAPY SUPPLIES (FLUTTER) DEVI    Use as directed.   RIFAMPIN (RIFADIN) 300 MG CAPSULE    Take 2 capsules (600 mg total) by mouth daily.   SPACER/AERO-HOLDING CHAMBERS (AEROCHAMBER MV) INHALER  Use as instructed   TURMERIC PO    Take 2,000 mg by mouth daily.   Modified Medications   No medications on file  Discontinued Medications   No medications on file    Subjective: Katria is in for her routine follow-up visit.  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  She has been on clofazimine, ethambutol and rifampin ever since September 2019.  She has been on and off aerosolized amikacin but stopped it for a second time last July when she felt like it was actually making her feel worse.  She is using a vibratory vest.  She is no longer using aerosolized saline.  She says that using the nebulizer with just "too much" and it felt like taking medication was taking over her life.  Her pulmonologist changed her to [facility] inhaler recently.  She feels like that did lead to some improvement in  her chronic cough and shortness of breath.  She has been fully vaccinated against pneumococcal infection, COVID and RSV.  Her last COVID-vaccine was in December of last year.  She developed symptomatic COVID infection in late January and was treated with molnupiravir.  She felt a little bit better after treatment but has been more fatigued than usual.  She still is very much bothered by coughing, especially in the morning.  She has not had any episodes of hemoptysis.  Her dyspnea is unchanged.  For the past 2 days she has had a slight increase in her chronic dry cough.  She thinks it may be due to the pollen.  Her appetite remains poor but she has not been losing weight.  This coming weekend and she will travel with her daughter for 8-day [place] cruise.  Review of Systems: Review of Systems  Constitutional:  Positive for malaise/fatigue. Negative for chills, diaphoresis, fever and weight loss.  Respiratory:  Positive for cough, sputum production, shortness of breath and wheezing. Negative for hemoptysis.     Past Medical History:  Diagnosis Date   Allergic rhinitis, cause unspecified    Bronchiectasis without acute exacerbation    Cough    Sinusitis     Social History   Tobacco Use   Smoking status: Former    Packs/day: 1.00    Years: 20.00    Additional pack years: 0.00    Total pack years: 20.00    Types: Cigarettes    Quit date: 12/14/1985    Years since quitting: 37.3   Smokeless tobacco: Never  Vaping Use   Vaping Use: Never used  Substance Use Topics   Alcohol use: Not Currently    Alcohol/week: 1.0 standard drink of alcohol    Types: 1 Glasses of wine per week    Comment: occ   Drug use: No    Family History  Problem Relation Age of Onset   Emphysema Sister    Stroke Mother    Clotting disorder Mother     No Known Allergies  Objective: Vitals:   04/06/23 1507  BP: 106/62  Pulse: 74  Temp: 98.4 F (36.9 C)  TempSrc: Temporal  SpO2: 99%  Weight:  100 lb (45.4 kg)  Height:  (1.6 m)   Body mass index is 17.71 kg/m.  Physical Exam Constitutional:      Comments: She has diffuse "tanning" of her skin due to clofazimine use.  She is very thin.  She is pleasant as usual.  Her weight is unchanged.  Cardiovascular:  Rate and Rhythm: Normal rate and regular rhythm.     Heart sounds: No murmur heard. Pulmonary:     Effort: Pulmonary effort is normal.     Breath sounds: Normal breath sounds. No wheezing, rhonchi or rales.     Comments: Very distant breath sounds. Psychiatric:        Mood and Affect: Mood normal.     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 Her best hope is to try to manage her underlying bronchiectasis and continue her current 3 drug oral antibiotic regimen with the hopes that it will slow progression of her infection.        Cliffton Asters, MD [facility] for Infectious Disease [facility] [phone] pager   [phone] cell 04/06/2023, 3:57 PM

## 2023-04-07 DIAGNOSIS — J479 Bronchiectasis, uncomplicated: Secondary | ICD-10-CM | POA: Diagnosis not present

## 2023-04-28 ENCOUNTER — Telehealth: Payer: Self-pay | Admitting: Pulmonary Disease

## 2023-04-28 MED ORDER — BREZTRI AEROSPHERE 160-9-4.8 MCG/ACT IN AERO
2.0000 | INHALATION_SPRAY | Freq: Two times a day (BID) | RESPIRATORY_TRACT | 4 refills | Status: DC
Start: 2023-04-28 — End: 2023-11-05

## 2023-04-28 NOTE — Telephone Encounter (Signed)
ATC X1 LVM for patient to call the office back 

## 2023-04-28 NOTE — Telephone Encounter (Signed)
PT ret sweet Paige's call. Please try again.

## 2023-04-28 NOTE — Telephone Encounter (Signed)
Breztri sample given and PT would like to have it called in.    New Pharm: [facility] in [place]   [phone]

## 2023-04-28 NOTE — Telephone Encounter (Signed)
Called and spoke with patient. She stated that the [facility] sample worked well for her and she wishes to have a RX sent to [facility]. I advised her that I would send this in for her. She verbalized understanding.   Nothing further needed at time of call.

## 2023-05-04 ENCOUNTER — Telehealth: Payer: Self-pay | Admitting: Pulmonary Disease

## 2023-05-04 NOTE — Telephone Encounter (Signed)
pt. on the line asking who they need to speak with she got a notice from [facility] and stated the Pulm rehab was not going to get paid for but the referral got put in buy Dr. Judeth Horn

## 2023-05-07 DIAGNOSIS — J479 Bronchiectasis, uncomplicated: Secondary | ICD-10-CM | POA: Diagnosis not present

## 2023-05-11 NOTE — Telephone Encounter (Signed)
If insurance isn't going to cover pulmonary rehab it would be up to patient and Dr Judeth Horn to discuss if she is to do the Pulmonary Rehab.

## 2023-05-14 ENCOUNTER — Ambulatory Visit: Payer: Medicare Other | Admitting: Pulmonary Disease

## 2023-05-14 ENCOUNTER — Encounter: Payer: Self-pay | Admitting: Pulmonary Disease

## 2023-05-14 VITALS — BP 126/70 | HR 85 | Ht 63.0 in | Wt 103.0 lb

## 2023-05-14 DIAGNOSIS — J209 Acute bronchitis, unspecified: Secondary | ICD-10-CM | POA: Diagnosis not present

## 2023-05-14 DIAGNOSIS — J42 Unspecified chronic bronchitis: Secondary | ICD-10-CM | POA: Diagnosis not present

## 2023-05-14 MED ORDER — AMOXICILLIN-POT CLAVULANATE 875-125 MG PO TABS: 1.0000 | ORAL_TABLET | Freq: Two times a day (BID) | ORAL | 0 refills | Status: AC

## 2023-05-14 NOTE — Telephone Encounter (Signed)
Pt has an upcoming appt with Dr. Judeth Horn today 5/31 and this can be discussed during that visit.  Routing to Dr. Judeth Horn as an Lorain Childes.

## 2023-05-14 NOTE — Progress Notes (Signed)
Synopsis: Referred in 2010 for bronchiectasis by Kiara Fusi, MD.  Previously patient of Dr. Stann Medina and Dr. Kendrick Medina and Dr. Chestine Medina.  Subjective:   PATIENT ID: Kiara Medina GENDER: female DOB: 04/10/1946, MRN: [id number]  Chief Complaint  Patient presents with   Follow-up    Pt states over the past 2-3 weeks, she has had a bad coughing spell at least once a week which will make her lose her breath.   Kiara Medina is a 77 y.o. woman with a history of MDR MAC and bronchiectasis who returns for routine follow-up.  Doing okay.  Vest therapy has helped.  Overall cough seems improved.  However, over the last week or more cough worse again.  A bit more productive.  No increased dyspnea.  She was able to go on her [place] cruise with her daughter.  She greatly enjoyed this.  Past Medical History:  Diagnosis Date   Allergic rhinitis, cause unspecified    Bronchiectasis without acute exacerbation (HCC)    Cough    Sinusitis      Family History  Problem Relation Age of Onset   Emphysema Sister    Stroke Mother    Clotting disorder Mother      Past Surgical History:  Procedure Laterality Date   APPENDECTOMY     CATARACT EXTRACTION  08/2013   CHOLECYSTECTOMY     TUBAL LIGATION     VESICOVAGINAL FISTULA CLOSURE W/ TAH      Social History   Socioeconomic History   Marital status: Widowed    Spouse name: Not on file   Number of children: Not on file   Years of education: Not on file   Highest education level: Not on file  Occupational History   Occupation: realtor  Tobacco Use   Smoking status: Former    Packs/day: 1.00    Years: 20.00    Additional pack years: 0.00    Total pack years: 20.00    Types: Cigarettes    Quit date: 12/14/1985    Years since quitting: 37.4   Smokeless tobacco: Never  Vaping Use   Vaping Use: Never used  Substance and Sexual Activity   Alcohol use: Not Currently    Alcohol/week: 1.0 standard drink of alcohol    Types: 1 Glasses of  wine per week    Comment: occ   Drug use: No   Sexual activity: Not on file  Other Topics Concern   Not on file  Social History Narrative   Not on file   Social Determinants of Health   Financial Resource Strain: Not on file  Food Insecurity: Not on file  Transportation Needs: Not on file  Physical Activity: Not on file  Stress: Not on file  Social Connections: Not on file  Intimate Partner Violence: Not on file     No Known Allergies   Immunization History  Administered Date(s) Administered   Fluad Quad(high Dose 65+) 09/24/2020   Influenza Split 09/13/2012, 09/06/2014   Influenza Whole 09/09/2009, 08/14/2010, 09/14/2011   Influenza, High Dose Seasonal PF 10/10/2015, 09/02/2021   Influenza,inj,Quad PF,6+ Mos 09/13/2013, 09/21/2016, 08/26/2017, 08/22/2018   Influenza-Unspecified 08/29/2019, 09/21/2022   Moderna Sars-Covid-2 Vaccination 01/03/2020, 01/31/2020, 08/02/2020, 05/06/2021   Pfizer Covid-19 Vaccine Bivalent Booster 5y-11y 11/24/2021   Pneumococcal Conjugate-13 11/24/2014   Pneumococcal Polysaccharide-23 07/17/2009   RSV,unspecified 10/30/2022    Outpatient Medications Prior to Visit  Medication Sig Dispense Refill   acetaminophen (TYLENOL) 500 MG tablet Take 1,000 mg by mouth every  6 (six) hours as needed for moderate pain.     acyclovir (ZOVIRAX) 400 MG tablet Take 400 mg by mouth 2 (two) times daily.     albuterol (VENTOLIN HFA) 108 (90 Base) MCG/ACT inhaler Inhale 2 puffs into the lungs every 6 (six) hours as needed for wheezing or shortness of breath. 18 g 3   alendronate (FOSAMAX) 70 MG tablet Take 70 mg by mouth once a week.     ascorbic acid (VITAMIN C) 500 MG tablet Take 500 mg by mouth daily.     atorvastatin (LIPITOR) 20 MG tablet Take 20 mg by mouth daily.     B Complex Vitamins (B COMPLEX-B12 PO) Take 1 tablet by mouth daily.     benzonatate (TESSALON PERLES) 100 MG capsule Take 1 capsule (100 mg total) by mouth 3 (three) times daily as needed for  cough. 270 capsule 3   Budeson-Glycopyrrol-Formoterol (BREZTRI AEROSPHERE) 160-9-4.8 MCG/ACT AERO Inhale 2 puffs into the lungs in the morning and at bedtime. 10.7 g 4   calcium carbonate (OSCAL) 1500 (600 Ca) MG TABS tablet Take 600 mg of elemental calcium by mouth 2 (two) times daily with a meal.     Cholecalciferol (VITAMIN D3) 125 MCG (5000 UT) TABS Take 1 tablet by mouth daily.     CLOFAZIMINE PO Take 100 mg by mouth daily.     ethambutol (MYAMBUTOL) 400 MG tablet TAKE 2 TABLETS DAILY 180 tablet 3   Flaxseed, Linseed, (FLAX SEED OIL) 1300 MG CAPS Take 1 tablet by mouth daily.     Melatonin 10 MG CAPS Take 10 mg by mouth at bedtime.     omeprazole (PRILOSEC) 40 MG capsule Take 1 capsule (40 mg total) by mouth daily. 90 capsule 3   Probiotic Product (PROBIOTIC DAILY PO) Take 1 capsule by mouth daily.     Propylene Glycol (SYSTANE BALANCE OP) Place 1 drop into the left eye daily as needed (dry eyes).     Respiratory Therapy Supplies (FLUTTER) DEVI Use as directed. 1 each 0   rifampin (RIFADIN) 300 MG capsule Take 2 capsules (600 mg total) by mouth daily. 180 capsule 3   Spacer/Aero-Holding Chambers (AEROCHAMBER MV) inhaler Use as instructed 1 each 2   TURMERIC PO Take 2,000 mg by mouth daily.      HYDROcodone bit-homatropine (HYCODAN) 5-1.5 MG/5ML syrup Take 5 mLs by mouth at bedtime as needed for cough. 240 mL 0   No facility-administered medications prior to visit.    Review of Systems  N/a   Objective:   Vitals:   05/14/23 1447  BP: 126/70  Pulse: 85  SpO2: 95%  Weight: 103 lb (46.7 kg)  Height: 5\' 3"  (1.6 m)      95% on   RA BMI Readings from Last 3 Encounters:  05/14/23 18.25 kg/m  04/06/23 17.71 kg/m  02/22/23 17.86 kg/m   Wt Readings from Last 3 Encounters:  05/14/23 103 lb (46.7 kg)  04/06/23 100 lb (45.4 kg)  02/22/23 100 lb 12.8 oz (45.7 kg)    Physical Exam BP 126/70 (BP Location: Left Arm, Patient Position: Sitting, Cuff Size: Normal)   Pulse 85    Ht 5\' 3"  (1.6 m)   Wt 103 lb (46.7 kg)   SpO2 95% Comment: RA  BMI 18.25 kg/m  General: Well-appearing in no acute distress, thin Eyes: EOMI, icterus Respiratory: Clear, no wheeze, normal work of breathing Cardiovascular: Regular rhythm, no murmur Extremities: Warm, no edema     CBC  Component Value Date/Time   WBC 7.3 05/19/2022 1559   RBC 4.35 05/19/2022 1559   HGB 12.6 05/19/2022 1559   HCT 39.4 05/19/2022 1559   PLT 242 05/19/2022 1559   MCV 90.6 05/19/2022 1559   MCH 29.0 05/19/2022 1559   MCHC 32.0 05/19/2022 1559   RDW 14.3 05/19/2022 1559   LYMPHSABS 1.3 09/24/2021 0519   MONOABS 0.5 09/24/2021 0519   EOSABS 0.2 09/24/2021 0519   BASOSABS 0.0 09/24/2021 0519    CHEMISTRY No results for input(s): "NA", "K", "CL", "CO2", "GLUCOSE", "BUN", "CREATININE", "CALCIUM", "MG", "PHOS" in the last 168 hours. CrCl cannot be calculated (Patient's most recent lab result is older than the maximum 21 days allowed.).  12/28/2019: IgG 954 IgM 96 IgA 289 IgE 10 IgG subclasses normal (other than increased IgG subclass 3)  Cultures: 3/10322 - pending 11/23/2019-MAI 05/17/2019-MAI 03/15/2019-MAI 01/10/2019 MAI 11/07/2018-MAI 10/10/2018-MAI  08/22/2018-MAI 07/11/2018-MAI 05/31/2018-MAI MAI susceptibilities-resistant clarithromycin, linezolid.  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  Susceptible to amikacin, cefepime, clarithromycin, imipenem, linezolid, trimethoprim sulfamethoxazole. 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  Tree-in-bud opacities diffusely.  Mucus  impaction in airways.  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     Assessment & Plan:     ICD-10-CM   1. Acute exacerbation of chronic bronchitis (HCC)  J20.9    J42           Dyspnea on exertion likely due to chronic bronchiectasis and asthma-improved on LAMA/LABA; worsed when back off these meds. Component of deconditioning.  Has seasonal allergies, describes bronchospasm. 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  No obvious immunodeficiencies.  Progressive on CT over the last 6 years.  No evidence of hypersensitivity pneumonitis from inhaled amikacin on CT scan.  Worsening cough over the last several days. --Augmentin twice daily for 14 days - Hypertonic saline to twice a day  -- Continue vest therapy -Continue current multi drug MAI therapy and ID follow-up.  I agree with Dr. Orvan Falconer that she has limited treatment options and would likely progress faster off meds --stopped inhaled amikacin once again 06/2022 due to worsening cough -CT chest hi res 02/2022 with worsened signs of NTM disease  Weight loss- concerned this may be a MAC secondary effect, complicated by fatigue and DOE.  Related to metabolic needs from dyspnea, cough.  Poor appetite.  This is ongoing. Weight overall stable.  RTC in 3 months with Dr. Judeth Horn.     Current Outpatient Medications:    acetaminophen (TYLENOL) 500 MG tablet,  Take 1,000 mg by mouth every 6 (six) hours as needed for moderate pain., Disp: , Rfl:    acyclovir (ZOVIRAX) 400 MG tablet, Take 400  mg by mouth 2 (two) times daily., Disp: , Rfl:    albuterol (VENTOLIN HFA) 108 (90 Base) MCG/ACT inhaler, Inhale 2 puffs into the lungs every 6 (six) hours as needed for wheezing or shortness of breath., Disp: 18 g, Rfl: 3   alendronate (FOSAMAX) 70 MG tablet, Take 70 mg by mouth once a week., Disp: , Rfl:    amoxicillin-clavulanate (AUGMENTIN) 875-125 MG tablet, Take 1 tablet by mouth 2 (two) times daily for 14 days., Disp: 28 tablet, Rfl: 0   ascorbic acid (VITAMIN C) 500 MG tablet, Take 500 mg by mouth daily., Disp: , Rfl:    atorvastatin (LIPITOR) 20 MG tablet, Take 20 mg by mouth daily., Disp: , Rfl:    B Complex Vitamins (B COMPLEX-B12 PO), Take 1 tablet by mouth daily., Disp: , Rfl:    benzonatate (TESSALON PERLES) 100 MG capsule, Take 1 capsule (100 mg total) by mouth 3 (three) times daily as needed for cough., Disp: 270 capsule, Rfl: 3   Budeson-Glycopyrrol-Formoterol (BREZTRI AEROSPHERE) 160-9-4.8 MCG/ACT AERO, Inhale 2 puffs into the lungs in the morning and at bedtime., Disp: 10.7 g, Rfl: 4   calcium carbonate (OSCAL) 1500 (600 Ca) MG TABS tablet, Take 600 mg of elemental calcium by mouth 2 (two) times daily with a meal., Disp: , Rfl:    Cholecalciferol (VITAMIN D3) 125 MCG (5000 UT) TABS, Take 1 tablet by mouth daily., Disp: , Rfl:    CLOFAZIMINE PO, Take 100 mg by mouth daily., Disp: , Rfl:    ethambutol (MYAMBUTOL) 400 MG tablet, TAKE 2 TABLETS DAILY, Disp: 180 tablet, Rfl: 3   Flaxseed, Linseed, (FLAX SEED OIL) 1300 MG CAPS, Take 1 tablet by mouth daily., Disp: , Rfl:    Melatonin 10 MG CAPS, Take 10 mg by mouth at bedtime., Disp: , Rfl:    omeprazole (PRILOSEC) 40 MG capsule, Take 1 capsule (40 mg total) by mouth daily., Disp: 90 capsule, Rfl: 3   Probiotic Product (PROBIOTIC DAILY PO), Take 1 capsule by mouth daily., Disp: , Rfl:    Propylene Glycol  (SYSTANE BALANCE OP), Place 1 drop into the left eye daily as needed (dry eyes)., Disp: , Rfl:    Respiratory Therapy Supplies (FLUTTER) DEVI, Use as directed., Disp: 1 each, Rfl: 0   rifampin (RIFADIN) 300 MG capsule, Take 2 capsules (600 mg total) by mouth daily., Disp: 180 capsule, Rfl: 3   Spacer/Aero-Holding Chambers (AEROCHAMBER MV) inhaler, Use as instructed, Disp: 1 each, Rfl: 2   TURMERIC PO, Take 2,000 mg by mouth daily. , Disp: , Rfl:

## 2023-05-14 NOTE — Patient Instructions (Signed)
Nice to see you again  I am so glad you had a great trip with your daughter  Since the cough is a bit worse over the last week, use Augmentin 1 tablet twice a day for 14 days  Continue the vest and other airway clearance  Return to clinic in 3 months or sooner if needed with Dr. Judeth Horn

## 2023-05-17 ENCOUNTER — Telehealth: Payer: Self-pay | Admitting: Pharmacist

## 2023-05-17 NOTE — Telephone Encounter (Signed)
2 bottles of clofazimine are located in the pharmacy office when patient needs a refill.  Nirvana Blanchett L. Tareka Jhaveri, PharmD, BCIDP, AAHIVP, CPP Clinical Pharmacist Practitioner Infectious Diseases Clinical Pharmacist [facility] 05/17/2023, 4:02 PM

## 2023-05-20 DIAGNOSIS — K080 Exfoliation of teeth due to systemic causes: Secondary | ICD-10-CM | POA: Diagnosis not present

## 2023-05-24 DIAGNOSIS — F3341 Major depressive disorder, recurrent, in partial remission: Secondary | ICD-10-CM | POA: Diagnosis not present

## 2023-05-24 DIAGNOSIS — E559 Vitamin D deficiency, unspecified: Secondary | ICD-10-CM | POA: Diagnosis not present

## 2023-05-24 DIAGNOSIS — J479 Bronchiectasis, uncomplicated: Secondary | ICD-10-CM | POA: Diagnosis not present

## 2023-05-24 DIAGNOSIS — R7301 Impaired fasting glucose: Secondary | ICD-10-CM | POA: Diagnosis not present

## 2023-05-24 DIAGNOSIS — E785 Hyperlipidemia, unspecified: Secondary | ICD-10-CM | POA: Diagnosis not present

## 2023-05-24 DIAGNOSIS — K219 Gastro-esophageal reflux disease without esophagitis: Secondary | ICD-10-CM | POA: Diagnosis not present

## 2023-06-02 ENCOUNTER — Ambulatory Visit: Payer: Medicare Other | Admitting: Infectious Disease

## 2023-06-02 ENCOUNTER — Telehealth: Payer: Self-pay | Admitting: Pharmacist

## 2023-06-02 ENCOUNTER — Other Ambulatory Visit: Payer: Self-pay

## 2023-06-02 ENCOUNTER — Ambulatory Visit: Payer: Medicare Other

## 2023-06-02 ENCOUNTER — Encounter: Payer: Self-pay | Admitting: Infectious Disease

## 2023-06-02 VITALS — BP 122/66 | HR 93 | Ht 63.0 in | Wt 103.0 lb

## 2023-06-02 DIAGNOSIS — J479 Bronchiectasis, uncomplicated: Secondary | ICD-10-CM | POA: Diagnosis not present

## 2023-06-02 DIAGNOSIS — A310 Pulmonary mycobacterial infection: Secondary | ICD-10-CM

## 2023-06-02 DIAGNOSIS — Z7185 Encounter for immunization safety counseling: Secondary | ICD-10-CM

## 2023-06-02 DIAGNOSIS — J4489 Other specified chronic obstructive pulmonary disease: Secondary | ICD-10-CM | POA: Diagnosis not present

## 2023-06-02 HISTORY — DX: Pulmonary mycobacterial infection: A31.0

## 2023-06-02 LAB — COMPLETE METABOLIC PANEL WITH GFR
AG Ratio: 1.1 (calc) (ref 1.0–2.5)
ALT: 9 U/L (ref 6–29)
AST: 15 U/L (ref 10–35)
Albumin: 3.4 g/dL — ABNORMAL LOW (ref 3.6–5.1)
Alkaline phosphatase (APISO): 68 U/L (ref 37–153)
BUN: 13 mg/dL (ref 7–25)
CO2: 28 mmol/L (ref 20–32)
Calcium: 8.9 mg/dL (ref 8.6–10.4)
Chloride: 100 mmol/L (ref 98–110)
Creat: 0.65 mg/dL (ref 0.60–1.00)
Globulin: 3.1 g/dL (calc) (ref 1.9–3.7)
Glucose, Bld: 86 mg/dL (ref 65–99)
Potassium: 4.8 mmol/L (ref 3.5–5.3)
Sodium: 137 mmol/L (ref 135–146)
Total Bilirubin: 0.4 mg/dL (ref 0.2–1.2)
Total Protein: 6.5 g/dL (ref 6.1–8.1)
eGFR: 90 mL/min/{1.73_m2} (ref >=60)

## 2023-06-02 LAB — CBC WITH DIFFERENTIAL/PLATELET
Absolute Monocytes: 464 cells/uL (ref 200–950)
Basophils Absolute: 31 cells/uL (ref 0–200)
Basophils Relative: 0.3 %
Eosinophils Absolute: 31 cells/uL (ref 15–500)
Eosinophils Relative: 0.3 %
HCT: 40.7 % (ref 35.0–45.0)
Hemoglobin: 13.1 g/dL (ref 11.7–15.5)
Lymphs Abs: 999 cells/uL (ref 850–3900)
MCH: 28.4 pg (ref 27.0–33.0)
MCHC: 32.2 g/dL (ref 32.0–36.0)
MCV: 88.3 fL (ref 80.0–100.0)
MPV: 9.8 fL (ref 7.5–12.5)
Monocytes Relative: 4.5 %
Neutro Abs: 8776 cells/uL — ABNORMAL HIGH (ref 1500–7800)
Neutrophils Relative %: 85.2 %
Platelets: 272 10*3/uL (ref 140–400)
RBC: 4.61 10*6/uL (ref 3.80–5.10)
RDW: 14.6 % (ref 11.0–15.0)
Total Lymphocyte: 9.7 %
WBC: 10.3 10*3/uL (ref 3.8–10.8)

## 2023-06-02 MED ORDER — RIFAMPIN 300 MG PO CAPS: 600.0000 mg | ORAL_CAPSULE | Freq: Every day | ORAL | 11 refills | Status: DC

## 2023-06-02 MED ORDER — ETHAMBUTOL HCL 400 MG PO TABS: 800.0000 mg | ORAL_TABLET | Freq: Every day | ORAL | 11 refills | Status: DC

## 2023-06-02 NOTE — Telephone Encounter (Signed)
Patient picked up 2 bottles of clofazimine on 06/02/23. Supply should last for ~3 months. Next refill due around middle/end of September.  Neamiah Sciarra L. Elis Sauber, PharmD, BCIDP, AAHIVP, CPP Clinical Pharmacist Practitioner Infectious Diseases Clinical Pharmacist [facility] 06/02/2023, 3:54 PM

## 2023-06-02 NOTE — Progress Notes (Signed)
Subjective:  Chief complaint follow-up for Mycobacterium avium infection  Patient ID: Kiara Medina, female    DOB: 11-01-1946, 77 y.o.   MRN: [id number]  HPI   Kiara Medina is a a 77 year old woman followed previously by my (now retired) partner Dr. 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  She was not treated at that time.  She tested positive again in March 2015.  She started on azithromycin, ethambutol and rifampin in December 2015 and completed the course of therapy in November 2016.   She relapsed in June 2018 and started on azithromycin, ethambutol and rifampin again.  She saw Dr. Zackery Barefoot at [facility] in November 2018.  Aerosolized amikacin was added in January 2019  because of evolving resistance.  In September 2019 azithromycin was changed to clofazimine and she continued ethambutol, rifampin and aerosolized amikacin.  C[place]ston was later stopped due to the fact that inhaling it seemed to be causing more problems with coughing.  She seems to been relatively stable since then in terms of her coughing and her weight has been stable in the 100 to 103 pound range.  She did suffer a recent episode of bronchitis that is improved with levofloxacin and prednisone prescribed by primary care.  She still does not have the best appetite which I expect is partly due to the rifampin.      Past Medical History:  Diagnosis Date   Allergic rhinitis, cause unspecified    Bronchiectasis without acute exacerbation (HCC)    Cough    Sinusitis     Past Surgical History:  Procedure Laterality Date   APPENDECTOMY     CATARACT EXTRACTION  08/2013   CHOLECYSTECTOMY     TUBAL LIGATION     VESICOVAGINAL FISTULA CLOSURE W/ TAH      Family History  Problem Relation Age of Onset   Emphysema Sister    Stroke Mother    Clotting disorder Mother       Social History   Socioeconomic History   Marital status: Widowed    Spouse name: Not on  file   Number of children: Not on file   Years of education: Not on file   Highest education level: Not on file  Occupational History   Occupation: realtor  Tobacco Use   Smoking status: Former    Packs/day: 1.00    Years: 20.00    Additional pack years: 0.00    Total pack years: 20.00    Types: Cigarettes    Quit date: 12/14/1985    Years since quitting: 37.4   Smokeless tobacco: Never  Vaping Use   Vaping Use: Never used  Substance and Sexual Activity   Alcohol use: Not Currently    Alcohol/week: 1.0 standard drink of alcohol    Types: 1 Glasses of wine per week    Comment: occ   Drug use: No   Sexual activity: Not on file  Other Topics Concern   Not on file  Social History Narrative   Not on file   Social Determinants of Health   Financial Resource Strain: Not on file  Food Insecurity: Not on file  Transportation Needs: Not on file  Physical Activity: Not on file  Stress: Not on file  Social Connections: Not on file    No Known Allergies   Current Outpatient Medications:    acetaminophen (TYLENOL) 500 MG tablet, Take 1,000 mg by mouth every 6 (six) hours as  needed for moderate pain., Disp: , Rfl:    acyclovir (ZOVIRAX) 400 MG tablet, Take 400 mg by mouth 2 (two) times daily., Disp: , Rfl:    albuterol (VENTOLIN HFA) 108 (90 Base) MCG/ACT inhaler, Inhale 2 puffs into the lungs every 6 (six) hours as needed for wheezing or shortness of breath., Disp: 18 g, Rfl: 3   alendronate (FOSAMAX) 70 MG tablet, Take 70 mg by mouth once a week., Disp: , Rfl:    ascorbic acid (VITAMIN C) 500 MG tablet, Take 500 mg by mouth daily., Disp: , Rfl:    atorvastatin (LIPITOR) 20 MG tablet, Take 20 mg by mouth daily., Disp: , Rfl:    B Complex Vitamins (B COMPLEX-B12 PO), Take 1 tablet by mouth daily., Disp: , Rfl:    benzonatate (TESSALON PERLES) 100 MG capsule, Take 1 capsule (100 mg total) by mouth 3 (three) times daily as needed for cough., Disp: 270 capsule, Rfl: 3    Budeson-Glycopyrrol-Formoterol (BREZTRI AEROSPHERE) 160-9-4.8 MCG/ACT AERO, Inhale 2 puffs into the lungs in the morning and at bedtime., Disp: 10.7 g, Rfl: 4   calcium carbonate (OSCAL) 1500 (600 Ca) MG TABS tablet, Take 600 mg of elemental calcium by mouth 2 (two) times daily with a meal., Disp: , Rfl:    Cholecalciferol (VITAMIN D3) 125 MCG (5000 UT) TABS, Take 1 tablet by mouth daily., Disp: , Rfl:    CLOFAZIMINE PO, Take 100 mg by mouth daily., Disp: , Rfl:    ethambutol (MYAMBUTOL) 400 MG tablet, TAKE 2 TABLETS DAILY, Disp: 180 tablet, Rfl: 3   Flaxseed, Linseed, (FLAX SEED OIL) 1300 MG CAPS, Take 1 tablet by mouth daily., Disp: , Rfl:    Melatonin 10 MG CAPS, Take 10 mg by mouth at bedtime., Disp: , Rfl:    omeprazole (PRILOSEC) 40 MG capsule, Take 1 capsule (40 mg total) by mouth daily., Disp: 90 capsule, Rfl: 3   Probiotic Product (PROBIOTIC DAILY PO), Take 1 capsule by mouth daily., Disp: , Rfl:    Propylene Glycol (SYSTANE BALANCE OP), Place 1 drop into the left eye daily as needed (dry eyes)., Disp: , Rfl:    Respiratory Therapy Supplies (FLUTTER) DEVI, Use as directed., Disp: 1 each, Rfl: 0   rifampin (RIFADIN) 300 MG capsule, Take 2 capsules (600 mg total) by mouth daily., Disp: 180 capsule, Rfl: 3   Spacer/Aero-Holding Chambers (AEROCHAMBER MV) inhaler, Use as instructed, Disp: 1 each, Rfl: 2   TURMERIC PO, Take 2,000 mg by mouth daily. , Disp: , Rfl:   Review of Systems  Constitutional:  Positive for appetite change. Negative for activity change, chills, diaphoresis, fatigue, fever and unexpected weight change.  HENT:  Negative for congestion, rhinorrhea, sinus pressure, sneezing, sore throat and trouble swallowing.   Eyes:  Negative for photophobia and visual disturbance.  Respiratory:  Positive for cough. Negative for chest tightness, shortness of breath, wheezing and stridor.   Cardiovascular:  Negative for chest pain, palpitations and leg swelling.  Gastrointestinal:   Negative for abdominal distention, abdominal pain, anal bleeding, blood in stool, constipation, diarrhea, nausea and vomiting.  Genitourinary:  Negative for difficulty urinating, dysuria, flank pain and hematuria.  Musculoskeletal:  Negative for arthralgias, back pain, gait problem, joint swelling and myalgias.  Skin:  Negative for color change, pallor, rash and wound.  Neurological:  Negative for dizziness, tremors, weakness and light-headedness.  Hematological:  Negative for adenopathy. Does not bruise/bleed easily.  Psychiatric/Behavioral:  Negative for agitation, behavioral problems, confusion, decreased concentration, dysphoric mood and  sleep disturbance.        Objective:   Physical Exam Constitutional:      General: She is not in acute distress.    Appearance: Normal appearance. She is well-developed. She is not ill-appearing or diaphoretic.  HENT:     Head: Normocephalic and atraumatic.     Right Ear: Hearing and external ear normal.     Left Ear: Hearing and external ear normal.     Nose: No nasal deformity or rhinorrhea.  Eyes:     General: No scleral icterus.    Conjunctiva/sclera: Conjunctivae normal.     Right eye: Right conjunctiva is not injected.     Left eye: Left conjunctiva is not injected.     Pupils: Pupils are equal, round, and reactive to light.  Neck:     Vascular: No JVD.  Cardiovascular:     Rate and Rhythm: Normal rate and regular rhythm.     Heart sounds: Normal heart sounds, S1 normal and S2 normal. No murmur heard.    No friction rub. No gallop.  Pulmonary:     Effort: No respiratory distress.     Breath sounds: No stridor. No wheezing, rhonchi or rales.  Abdominal:     General: There is no distension.     Palpations: Abdomen is soft.  Musculoskeletal:        General: Normal range of motion.     Right shoulder: Normal.     Left shoulder: Normal.     Cervical back: Normal range of motion and neck supple.     Right hip: Normal.     Left hip:  Normal.     Right knee: Normal.     Left knee: Normal.  Lymphadenopathy:     Head:     Right side of head: No submandibular, preauricular or posterior auricular adenopathy.     Left side of head: No submandibular, preauricular or posterior auricular adenopathy.     Cervical: No cervical adenopathy.     Right cervical: No superficial or deep cervical adenopathy.    Left cervical: No superficial or deep cervical adenopathy.  Skin:    General: Skin is warm and dry.     Coloration: Skin is not pale.     Findings: No abrasion, bruising, ecchymosis, erythema, lesion or rash.     Nails: There is no clubbing.  Neurological:     General: No focal deficit present.     Mental Status: She is alert and oriented to person, place, and time.     Sensory: No sensory deficit.     Coordination: Coordination normal.     Gait: Gait normal.  Psychiatric:        Attention and Perception: She is attentive.        Mood and Affect: Mood normal.        Speech: Speech normal.        Behavior: Behavior normal. Behavior is cooperative.        Thought Content: Thought content normal.        Judgment: Judgment normal.           Assessment & Plan:   M avium pulmonary infection:   Continue clofazimine ethambutol and rifampin  Will check CMP with GFR and CBC with differential today.    Vaccine counseling: Recommended she receive second dose of updated COVID-19 vaccine but she did not want Pfizer so she will hopefully seek this outside the clinic since we do not carry [facility]  I have personally spent 50 minutes involved in face-to-face and non-face-to-face activities for this patient on the day of the visit. Professional time spent includes the following activities: Preparing to see the patient (review of tests), Obtaining and/or reviewing separately obtained history (admission/discharge record), Performing a medically appropriate examination and/or evaluation , Ordering medications/tests/procedures,  referring and communicating with other health care professionals, Documenting clinical information in the EMR, Independently interpreting results (not separately reported), Communicating results to the patient/family/caregiver, Counseling and educating the patient/family/caregiver and Care coordination (not separately reported).

## 2023-06-07 ENCOUNTER — Other Ambulatory Visit: Payer: Self-pay | Admitting: Pulmonary Disease

## 2023-06-07 DIAGNOSIS — J479 Bronchiectasis, uncomplicated: Secondary | ICD-10-CM | POA: Diagnosis not present

## 2023-07-07 DIAGNOSIS — J479 Bronchiectasis, uncomplicated: Secondary | ICD-10-CM | POA: Diagnosis not present

## 2023-08-07 DIAGNOSIS — J479 Bronchiectasis, uncomplicated: Secondary | ICD-10-CM | POA: Diagnosis not present

## 2023-08-12 ENCOUNTER — Ambulatory Visit: Payer: Medicare Other | Admitting: Pulmonary Disease

## 2023-08-12 ENCOUNTER — Encounter: Payer: Self-pay | Admitting: Pulmonary Disease

## 2023-08-12 VITALS — BP 124/64 | HR 105 | Ht 63.0 in | Wt 98.4 lb

## 2023-08-12 DIAGNOSIS — Z23 Encounter for immunization: Secondary | ICD-10-CM

## 2023-08-12 DIAGNOSIS — J479 Bronchiectasis, uncomplicated: Secondary | ICD-10-CM

## 2023-08-12 MED ORDER — PREDNISONE 10 MG PO TABS: ORAL_TABLET | ORAL | 0 refills | Status: AC

## 2023-08-12 NOTE — Progress Notes (Signed)
Synopsis: Referred in 2010 for bronchiectasis by Paulina Fusi, MD.  Previously patient of Dr. Stann Mainland and Dr. Kendrick Fries and Dr. Chestine Spore.  Subjective:   PATIENT ID: Kiara Medina GENDER: female DOB: Jul 06, 1946, MRN: [id number]  Chief Complaint  Patient presents with   Follow-up    Coughing, sob , bothered by the heat    [name] is a 77 y.o. woman with a history of MDR MAC and bronchiectasis who returns for routine follow-up.  Most recent infectious disease note reviewed.  Cough and breathing seem worse with the heat.  Cough different than typical bronchiectasis.  Less productive.  More dry.  Like a spasm.  Bronchospasm.  Using [place] but it is like it helps much.  Does not like the medicines, not.  Discussed trying with a spacer.  If this does not help can always stop and try something different.  Historically inhalers have not seem to help much but Breztri seem more beneficial in the past based on reports.  Discussed with him the rationale for prednisone given concern for asthma exacerbation.  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   Sinusitis      Family History  Problem Relation Age of Onset   Emphysema Sister    Stroke Mother    Clotting disorder Mother      Past Surgical History:  Procedure Laterality Date   APPENDECTOMY     CATARACT EXTRACTION  08/2013   CHOLECYSTECTOMY     TUBAL LIGATION     VESICOVAGINAL FISTULA CLOSURE W/ TAH      Social History   Socioeconomic History   Marital status: Widowed    Spouse name: Not on file   Number of children: Not on file   Years of education: Not on file   Highest education level: Not on file  Occupational History   Occupation: realtor  Tobacco Use   Smoking status: Former    Current packs/day: 0.00    Average packs/day: 1 pack/day for 20.0 years (20.0 ttl pk-yrs)     Types: Cigarettes    Start date: 12/14/1965    Quit date: 12/14/1985    Years since quitting: 37.6   Smokeless tobacco: Never  Vaping Use   Vaping status: Never Used  Substance and Sexual Activity   Alcohol use: Not Currently    Alcohol/week: 1.0 standard drink of alcohol    Types: 1 Glasses of wine per week    Comment: occ   Drug use: No   Sexual activity: Not on file  Other Topics Concern   Not on file  Social History Narrative   Not on file   Social Determinants of Health   Financial Resource Strain: Not on file  Food Insecurity: Not on file  Transportation Needs: Not on file  Physical Activity: Not on file  Stress: Not on file  Social Connections: Not on file  Intimate Partner Violence: Not on file     No Known Allergies   Immunization History  Administered Date(s) Administered   Fluad Quad(high Dose 65+) 09/24/2020   Influenza Split 09/13/2012, 09/06/2014   Influenza Whole 09/09/2009, 08/14/2010, 09/14/2011   Influenza, High Dose Seasonal PF 10/10/2015, 09/02/2021   Influenza,inj,Quad PF,6+ Mos 09/13/2013, 09/21/2016, 08/26/2017, 08/22/2018   Influenza-Unspecified 08/29/2019, 09/21/2022   Moderna Sars-Covid-2 Vaccination 01/03/2020, 01/31/2020, 08/02/2020, 05/06/2021   PNEUMOCOCCAL CONJUGATE-20 08/12/2023  Pfizer Covid-19 Vaccine Bivalent Booster 5y-11y 11/24/2021   Pneumococcal Conjugate-13 11/24/2014   Pneumococcal Polysaccharide-23 07/17/2009   RSV,unspecified 10/30/2022    Outpatient Medications Prior to Visit  Medication Sig Dispense Refill   acetaminophen (TYLENOL) 500 MG tablet Take 1,000 mg by mouth every 6 (six) hours as needed for moderate pain.     acyclovir (ZOVIRAX) 400 MG tablet Take 400 mg by mouth 2 (two) times daily.     albuterol (VENTOLIN HFA) 108 (90 Base) MCG/ACT inhaler Inhale 2 puffs into the lungs every 6 (six) hours as needed for wheezing or shortness of breath. 18 g 3   alendronate (FOSAMAX) 70 MG tablet Take 70 mg by mouth once a week.      atorvastatin (LIPITOR) 20 MG tablet Take 20 mg by mouth daily.     B Complex Vitamins (B COMPLEX-B12 PO) Take 1 tablet by mouth daily.     benzonatate (TESSALON PERLES) 100 MG capsule Take 1 capsule (100 mg total) by mouth 3 (three) times daily as needed for cough. 270 capsule 3   Budeson-Glycopyrrol-Formoterol (BREZTRI AEROSPHERE) 160-9-4.8 MCG/ACT AERO Inhale 2 puffs into the lungs in the morning and at bedtime. 10.7 g 4   calcium carbonate (OSCAL) 1500 (600 Ca) MG TABS tablet Take 600 mg of elemental calcium by mouth 2 (two) times daily with a meal.     Cholecalciferol (VITAMIN D3) 125 MCG (5000 UT) TABS Take 1 tablet by mouth daily.     CLOFAZIMINE PO Take 100 mg by mouth daily.     ethambutol (MYAMBUTOL) 400 MG tablet Take 2 tablets (800 mg total) by mouth daily. 180 tablet 11   Flaxseed, Linseed, (FLAX SEED OIL) 1300 MG CAPS Take 1 tablet by mouth daily.     levofloxacin (LEVAQUIN) 750 MG tablet Take 750 mg by mouth daily. (Patient not taking: Reported on 06/02/2023)     Melatonin 10 MG CAPS Take 10 mg by mouth at bedtime.     omeprazole (PRILOSEC) 40 MG capsule TAKE 1 CAPSULE DAILY 90 capsule 3   predniSONE (DELTASONE) 10 MG tablet Take by mouth.     Probiotic Product (PROBIOTIC DAILY PO) Take 1 capsule by mouth daily.     Propylene Glycol (SYSTANE BALANCE OP) Place 1 drop into the left eye daily as needed (dry eyes).     Respiratory Therapy Supplies (FLUTTER) DEVI Use as directed. 1 each 0   rifampin (RIFADIN) 300 MG capsule Take 2 capsules (600 mg total) by mouth daily. 180 capsule 11   Spacer/Aero-Holding Chambers (AEROCHAMBER MV) inhaler Use as instructed 1 each 2   TURMERIC PO Take 2,000 mg by mouth daily.      ascorbic acid (VITAMIN C) 500 MG tablet Take 500 mg by mouth daily.     No facility-administered medications prior to visit.    Review of Systems  N/a   Objective:   Vitals:   08/12/23 1508  BP: 124/64  Pulse: (!) 105  SpO2: 96%  Weight: 98 lb 6.4 oz (44.6  kg)  Height: 5\' 3"  (1.6 m)      96% on   RA BMI Readings from Last 3 Encounters:  08/12/23 17.43 kg/m  06/02/23 18.25 kg/m  05/14/23 18.25 kg/m   Wt Readings from Last 3 Encounters:  08/12/23 98 lb 6.4 oz (44.6 kg)  06/02/23 103 lb (46.7 kg)  05/14/23 103 lb (46.7 kg)    Physical Exam BP 124/64   Pulse (!) 105   Ht 5\' 3"  (1.6 m)  Wt 98 lb 6.4 oz (44.6 kg)   SpO2 96%   BMI 17.43 kg/m  General: Well-appearing in no acute distress, thin Eyes: EOMI, icterus Respiratory: Clear, no wheeze, normal work of breathing Cardiovascular: Regular rhythm, no murmur Extremities: Warm, no edema     CBC    Component Value Date/Time   WBC 10.3 06/02/2023 0322   RBC 4.61 06/02/2023 0322   HGB 13.1 06/02/2023 0322   HCT 40.7 06/02/2023 0322   PLT 272 06/02/2023 0322   MCV 88.3 06/02/2023 0322   MCH 28.4 06/02/2023 0322   MCHC 32.2 06/02/2023 0322   RDW 14.6 06/02/2023 0322   LYMPHSABS 999 06/02/2023 0322   MONOABS 0.5 09/24/2021 0519   EOSABS 31 06/02/2023 0322   BASOSABS 31 06/02/2023 0322    CHEMISTRY No results for input(s): "NA", "K", "CL", "CO2", "GLUCOSE", "BUN", "CREATININE", "CALCIUM", "MG", "PHOS" in the last 168 hours. CrCl cannot be calculated (Patient's most recent lab result is older than the maximum 21 days allowed.).  12/28/2019: IgG 954 IgM 96 IgA 289 IgE 10 IgG subclasses normal (other than increased IgG subclass 3)  Cultures: 3/10322 - pending 11/23/2019-MAI 05/17/2019-MAI 03/15/2019-MAI 01/10/2019 MAI 11/07/2018-MAI 10/10/2018-MAI  08/22/2018-MAI 07/11/2018-MAI 05/31/2018-MAI MAI susceptibilities-resistant clarithromycin, linezolid.  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  Susceptible to amikacin, cefepime, clarithromycin, imipenem, linezolid,  trimethoprim sulfamethoxazole. 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  Tree-in-bud opacities diffusely.  Mucus impaction in airways.  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     Assessment & Plan:     ICD-10-CM   1. BRONCHIECTASIS  J47.9 Pneumococcal conjugate vaccine 20-valent (Prevnar-20)          Dyspnea on exertion likely due to chronic bronchiectasis and asthma-improved on LAMA/LABA; worsed when back off these meds. Component of deconditioning.  Has seasonal allergies, describes bronchospasm. - Unclear benefit with inhalers in the past, (Stiolto and Trelegy), mild improvement on Breztri, to continue with spacer -Continue using albuterol as needed --TTE reassuring 2022 -- s/p pulmonary rehab  Asthma with acute exacerbation: Cough less productive.  More like bronchospasm.  Worried about exacerbation.  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 -- Continue Breztri as above, new spacer today see if helps with administration -- Prednisone 20 mg for 5 days and 10 mg for 5 days.  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  Progressive on CT over the last 6 years.  No evidence of hypersensitivity pneumonitis from inhaled amikacin on CT scan.   - Hypertonic saline to twice a day  -- Continue vest therapy -Continue current multi drug MAI therapy  and ID follow-up.  I agree with Dr. Algis Liming that she has limited treatment options and would likely progress faster off meds --stopped inhaled amikacin once again 06/2022 due to worsening cough -CT chest hi res 02/2022  with worsened signs of NTM disease  Weight loss- concerned this may be a MAC secondary effect, complicated by fatigue and DOE.  Related to metabolic needs from dyspnea, cough.  Poor appetite.  This is ongoing. Weight overall stable.  Healthcare maintenance: -- Prevnar 20, has received both 23 and 16 Valent but most recent 2018, after shared decision making per CDC guidelines agreed for additional dose, encouraged to get flu shot this fall, encouraged to get RSV again, yearly dose, 10/2023  RTC in 3 months with Dr. Judeth Horn.   I spent 40 minutes in care of patient including face-to-face visit, review of records, coordination of care.   Current Outpatient Medications:    predniSONE (DELTASONE) 10 MG tablet, Take 2 tablets (20 mg total) by mouth daily with breakfast for 5 days, THEN 1 tablet (10 mg total) daily with breakfast for 5 days., Disp: 15 tablet, Rfl: 0   acetaminophen (TYLENOL) 500 MG tablet, Take 1,000 mg by mouth every 6 (six) hours as needed for moderate pain., Disp: , Rfl:    acyclovir (ZOVIRAX) 400 MG tablet, Take 400 mg by mouth 2 (two) times daily., Disp: , Rfl:    albuterol (VENTOLIN HFA) 108 (90 Base) MCG/ACT inhaler, Inhale 2 puffs into the lungs every 6 (six) hours as needed for wheezing or shortness of breath., Disp: 18 g, Rfl: 3   alendronate (FOSAMAX) 70 MG tablet, Take 70 mg by mouth once a week., Disp: , Rfl:    atorvastatin (LIPITOR) 20 MG tablet, Take 20 mg by mouth daily., Disp: , Rfl:    B Complex Vitamins (B COMPLEX-B12 PO), Take 1 tablet by mouth daily., Disp: , Rfl:    benzonatate (TESSALON PERLES) 100 MG capsule, Take 1 capsule (100 mg total) by mouth 3 (three) times daily as needed for cough., Disp: 270 capsule, Rfl: 3    Budeson-Glycopyrrol-Formoterol (BREZTRI AEROSPHERE) 160-9-4.8 MCG/ACT AERO, Inhale 2 puffs into the lungs in the morning and at bedtime., Disp: 10.7 g, Rfl: 4   calcium carbonate (OSCAL) 1500 (600 Ca) MG TABS tablet, Take 600 mg of elemental calcium by mouth 2 (two) times daily with a meal., Disp: , Rfl:    Cholecalciferol (VITAMIN D3) 125 MCG (5000 UT) TABS, Take 1 tablet by mouth daily., Disp: , Rfl:    CLOFAZIMINE PO, Take 100 mg by mouth daily., Disp: , Rfl:    ethambutol (MYAMBUTOL) 400 MG tablet, Take 2 tablets (800 mg total) by mouth daily., Disp: 180 tablet, Rfl: 11   Flaxseed, Linseed, (FLAX SEED OIL) 1300 MG CAPS, Take 1 tablet by mouth daily., Disp: , Rfl:    levofloxacin (LEVAQUIN) 750 MG tablet, Take 750 mg by mouth daily. (Patient not taking: Reported on 06/02/2023), Disp: , Rfl:    Melatonin 10 MG CAPS, Take 10 mg by mouth at bedtime., Disp: , Rfl:    omeprazole (PRILOSEC) 40 MG capsule, TAKE 1 CAPSULE DAILY, Disp: 90 capsule, Rfl: 3   predniSONE (DELTASONE) 10 MG tablet, Take by mouth., Disp: , Rfl:    Probiotic Product (PROBIOTIC DAILY PO), Take 1 capsule by mouth daily., Disp: , Rfl:    Propylene Glycol (SYSTANE BALANCE OP), Place 1 drop into the left eye daily as needed (dry eyes)., Disp: , Rfl:    Respiratory Therapy Supplies (FLUTTER) DEVI, Use as directed., Disp: 1 each, Rfl: 0   rifampin (RIFADIN) 300 MG capsule, Take 2 capsules (600 mg total) by mouth daily., Disp: 180 capsule, Rfl: 11   Spacer/Aero-Holding Chambers (AEROCHAMBER MV) inhaler, Use  as instructed, Disp: 1 each, Rfl: 2   TURMERIC PO, Take 2,000 mg by mouth daily. , Disp: , Rfl:

## 2023-08-12 NOTE — Patient Instructions (Addendum)
Nice to see you again  Take prednisone as prescribed   Continue Breztri with spacer  Return to clinic in 3 months or sooner as needed

## 2023-08-19 DIAGNOSIS — Z961 Presence of intraocular lens: Secondary | ICD-10-CM | POA: Diagnosis not present

## 2023-08-19 DIAGNOSIS — H43391 Other vitreous opacities, right eye: Secondary | ICD-10-CM | POA: Diagnosis not present

## 2023-08-19 DIAGNOSIS — H43811 Vitreous degeneration, right eye: Secondary | ICD-10-CM | POA: Diagnosis not present

## 2023-08-26 ENCOUNTER — Telehealth: Payer: Self-pay | Admitting: Pharmacist

## 2023-08-26 NOTE — Telephone Encounter (Signed)
2 bottles of clofazimine are located in the pharmacy office when patient needs a refill.  Madora Barletta L. Hala Narula, PharmD, BCIDP, AAHIVP, CPP Clinical Pharmacist Practitioner Infectious Diseases Clinical Pharmacist [facility] 08/26/2023, 11:17 AM

## 2023-08-30 DIAGNOSIS — A310 Pulmonary mycobacterial infection: Secondary | ICD-10-CM | POA: Diagnosis not present

## 2023-08-30 DIAGNOSIS — E785 Hyperlipidemia, unspecified: Secondary | ICD-10-CM | POA: Diagnosis not present

## 2023-08-30 DIAGNOSIS — E559 Vitamin D deficiency, unspecified: Secondary | ICD-10-CM | POA: Diagnosis not present

## 2023-08-30 DIAGNOSIS — K219 Gastro-esophageal reflux disease without esophagitis: Secondary | ICD-10-CM | POA: Diagnosis not present

## 2023-08-30 DIAGNOSIS — R7301 Impaired fasting glucose: Secondary | ICD-10-CM | POA: Diagnosis not present

## 2023-09-06 ENCOUNTER — Ambulatory Visit: Payer: Medicare Other | Admitting: Infectious Disease

## 2023-09-06 ENCOUNTER — Other Ambulatory Visit: Payer: Self-pay

## 2023-09-06 ENCOUNTER — Encounter: Payer: Self-pay | Admitting: Infectious Disease

## 2023-09-06 VITALS — BP 119/78 | HR 87 | Resp 16 | Ht 63.0 in | Wt 98.6 lb

## 2023-09-06 DIAGNOSIS — A310 Pulmonary mycobacterial infection: Secondary | ICD-10-CM

## 2023-09-06 DIAGNOSIS — H547 Unspecified visual loss: Secondary | ICD-10-CM

## 2023-09-06 DIAGNOSIS — Z7185 Encounter for immunization safety counseling: Secondary | ICD-10-CM

## 2023-09-06 NOTE — Progress Notes (Signed)
Subjective:  Chief complaint follow-up for Mycobacterium avium infection   Patient ID: Kiara Medina, female    DOB: June 02, 1946, 77 y.o.   MRN: [id number]  HPI   Kiara Medina is a a 77 year old woman followed previously by my (now retired) partner Dr. 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  She was not treated at that time.  She tested positive again in March 2015.  She started on azithromycin, ethambutol and rifampin in December 2015 and completed the course of therapy in November 2016.   She relapsed in June 2018 and started on azithromycin, ethambutol and rifampin again.  She saw Dr. Zackery Barefoot at [facility] in November 2018.  Aerosolized amikacin was added in January 2019  because of evolving resistance.  In September 2019 azithromycin was changed to clofazimine and she continued ethambutol, rifampin and aerosolized amikacin.  [place] was later stopped due to the fact that inhaling it seemed to be causing more problems with coughing.  She seems to been relatively stable since then in terms of her coughing and her weight has been stable in the 100 to 103 pound range.  She did suffer a recent episode of bronchitis that is improved with levofloxacin and prednisone prescribed by primary care.   Another flare of bronchitis recently requiring steroid pulse and taper.  She is going to see her ophthalmologist at [facility] because she has noted reduced visual acuity over the last 2 months.  I have cautioned her if this worsens with any significance that she needs to stop her medications.  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   Sinusitis      Past Surgical History:  Procedure Laterality Date   APPENDECTOMY     CATARACT EXTRACTION  08/2013   CHOLECYSTECTOMY     TUBAL LIGATION     VESICOVAGINAL FISTULA CLOSURE W/ TAH      Family History  Problem Relation Age of Onset   Emphysema Sister    Stroke Mother    Clotting disorder Mother       Social History   Socioeconomic History   Marital status: Widowed    Spouse name: Not on file   Number of children: Not on file   Years of education: Not on file   Highest education level: Not on file  Occupational History   Occupation: realtor  Tobacco Use   Smoking status: Former    Current packs/day: 0.00    Average packs/day: 1 pack/day for 20.0 years (20.0 ttl pk-yrs)    Types: Cigarettes    Start date: 12/14/1965    Quit date: 12/14/1985    Years since quitting: 37.7   Smokeless tobacco: Never  Vaping Use   Vaping status: Never Used  Substance and Sexual Activity   Alcohol use: Not Currently    Alcohol/week: 1.0 standard drink of alcohol    Types: 1 Glasses of wine per week    Comment: occ   Drug use: No   Sexual activity: Not on file  Other Topics Concern   Not on file  Social History Narrative   Not on  file   Social Determinants of Health   Financial Resource Strain: Not on file  Food Insecurity: Not on file  Transportation Needs: Not on file  Physical Activity: Not on file  Stress: Not on file  Social Connections: Not on file    No Known Allergies   Current Outpatient Medications:    acetaminophen (TYLENOL) 500 MG tablet, Take 1,000 mg by mouth every 6 (six) hours as needed for moderate pain., Disp: , Rfl:    acyclovir (ZOVIRAX) 400 MG tablet, Take 400 mg by mouth 2 (two) times daily., Disp: , Rfl:    albuterol (VENTOLIN HFA) 108 (90 Base) MCG/ACT inhaler, Inhale 2 puffs into the lungs every 6 (six) hours as needed for wheezing or shortness of breath., Disp: 18 g, Rfl: 3   alendronate (FOSAMAX) 70 MG tablet, Take 70 mg by mouth once a week., Disp: ,  Rfl:    atorvastatin (LIPITOR) 20 MG tablet, Take 20 mg by mouth daily., Disp: , Rfl:    B Complex Vitamins (B COMPLEX-B12 PO), Take 1 tablet by mouth daily., Disp: , Rfl:    benzonatate (TESSALON PERLES) 100 MG capsule, Take 1 capsule (100 mg total) by mouth 3 (three) times daily as needed for cough., Disp: 270 capsule, Rfl: 3   Budeson-Glycopyrrol-Formoterol (BREZTRI AEROSPHERE) 160-9-4.8 MCG/ACT AERO, Inhale 2 puffs into the lungs in the morning and at bedtime., Disp: 10.7 g, Rfl: 4   calcium carbonate (OSCAL) 1500 (600 Ca) MG TABS tablet, Take 600 mg of elemental calcium by mouth 2 (two) times daily with a meal., Disp: , Rfl:    Cholecalciferol (VITAMIN D3) 125 MCG (5000 UT) TABS, Take 1 tablet by mouth daily., Disp: , Rfl:    CLOFAZIMINE PO, Take 100 mg by mouth daily., Disp: , Rfl:    ethambutol (MYAMBUTOL) 400 MG tablet, Take 2 tablets (800 mg total) by mouth daily., Disp: 180 tablet, Rfl: 11   Flaxseed, Linseed, (FLAX SEED OIL) 1300 MG CAPS, Take 1 tablet by mouth daily., Disp: , Rfl:    levofloxacin (LEVAQUIN) 750 MG tablet, Take 750 mg by mouth daily. (Patient not taking: Reported on 06/02/2023), Disp: , Rfl:    Melatonin 10 MG CAPS, Take 10 mg by mouth at bedtime., Disp: , Rfl:    omeprazole (PRILOSEC) 40 MG capsule, TAKE 1 CAPSULE DAILY, Disp: 90 capsule, Rfl: 3   predniSONE (DELTASONE) 10 MG tablet, Take by mouth., Disp: , Rfl:    Probiotic Product (PROBIOTIC DAILY PO), Take 1 capsule by mouth daily., Disp: , Rfl:    Propylene Glycol (SYSTANE BALANCE OP), Place 1 drop into the left eye daily as needed (dry eyes)., Disp: , Rfl:    Respiratory Therapy Supplies (FLUTTER) DEVI, Use as directed., Disp: 1 each, Rfl: 0   rifampin (RIFADIN) 300 MG capsule, Take 2 capsules (600 mg total) by mouth daily., Disp: 180 capsule, Rfl: 11   Spacer/Aero-Holding Chambers (AEROCHAMBER MV) inhaler, Use as instructed, Disp: 1 each, Rfl: 2   TURMERIC PO, Take 2,000 mg by mouth daily. , Disp: , Rfl:    Review of Systems  Constitutional:  Negative for activity change, appetite change, chills, diaphoresis, fatigue, fever and unexpected weight change.  HENT:  Negative for congestion, rhinorrhea, sinus pressure, sneezing, sore throat and trouble swallowing.   Eyes:  Negative for photophobia and visual disturbance.  Respiratory:  Positive for cough and shortness of breath. Negative for chest tightness, wheezing and stridor.   Cardiovascular:  Negative for chest pain, palpitations and leg swelling.  Gastrointestinal:  Negative for abdominal distention, abdominal pain, anal bleeding, blood in stool, constipation, diarrhea, nausea and vomiting.  Genitourinary:  Negative for difficulty urinating, dysuria, flank pain and hematuria.  Musculoskeletal:  Negative for arthralgias, back pain, gait problem, joint swelling and myalgias.  Skin:  Negative for color change, pallor, rash and wound.  Neurological:  Negative for dizziness, tremors, weakness and light-headedness.  Hematological:  Negative for adenopathy. Does not bruise/bleed easily.  Psychiatric/Behavioral:  Negative for agitation, behavioral problems, confusion, decreased concentration, dysphoric mood and sleep disturbance.        Objective:   Physical Exam Constitutional:      General: She is not in acute distress.    Appearance: Normal appearance. She is well-developed. She is not ill-appearing or diaphoretic.  HENT:     Head: Normocephalic and atraumatic.     Right Ear: Hearing and external ear normal.     Left Ear: Hearing and external ear normal.     Nose: No nasal deformity or rhinorrhea.  Eyes:     General: No scleral icterus.    Conjunctiva/sclera: Conjunctivae normal.     Right eye: Right conjunctiva is not injected.     Left eye: Left conjunctiva is not injected.     Pupils: Pupils are equal, round, and reactive to light.  Neck:     Vascular: No JVD.  Cardiovascular:     Rate and Rhythm: Normal rate and regular rhythm.      Heart sounds: Normal heart sounds, S1 normal and S2 normal. No murmur heard.    No friction rub. No gallop.  Pulmonary:     Effort: Prolonged expiration present.     Breath sounds: Normal breath sounds. No decreased air movement or transmitted upper airway sounds. No wheezing.  Abdominal:     General: Bowel sounds are normal. There is no distension.     Palpations: Abdomen is soft.     Tenderness: There is no abdominal tenderness.  Musculoskeletal:        General: Normal range of motion.     Right shoulder: Normal.     Left shoulder: Normal.     Cervical back: Normal range of motion and neck supple.     Right hip: Normal.     Left hip: Normal.     Right knee: Normal.     Left knee: Normal.  Lymphadenopathy:     Head:     Right side of head: No submandibular, preauricular or posterior auricular adenopathy.     Left side of head: No submandibular, preauricular or posterior auricular adenopathy.     Cervical: No cervical adenopathy.     Right cervical: No superficial or deep cervical adenopathy.    Left cervical: No superficial or deep cervical adenopathy.  Skin:    General: Skin is warm and dry.     Coloration: Skin is not pale.     Findings: No abrasion, bruising, ecchymosis, erythema, lesion or rash.     Nails: There is no clubbing.  Neurological:     Mental Status: She is alert and oriented to person, place, and time.     Sensory: No sensory deficit.     Coordination: Coordination normal.     Gait: Gait normal.  Psychiatric:        Attention and Perception: She is attentive.        Speech: Speech normal.        Behavior: Behavior normal. Behavior is cooperative.  Thought Content: Thought content normal.        Judgment: Judgment normal.           Assessment & Plan:   M avium infection  Now continue with her clofazimine rifampin and ethambutol  Decreased visual acuity: Not clear this related to her ethambutol but certainly she is cautioned to stop all of  her medications if this worsens she is going to be seeing her ophthalmologist next week.   Seen counseling recommended flu shot which she says she always gets in 1 October and I also recommended updated COVID-19 vaccine    .

## 2023-09-08 ENCOUNTER — Telehealth: Payer: Self-pay | Admitting: Pharmacist

## 2023-09-08 NOTE — Telephone Encounter (Signed)
Patient picked up 2 bottles of clofazimine on 09/06/23. Supply should last for ~3 months. Next refill due around the middle/end of December.  Brieana Shimmin L. Melecio Cueto, PharmD, BCIDP, AAHIVP, CPP Clinical Pharmacist Practitioner Infectious Diseases Clinical Pharmacist [facility] 09/08/2023, 3:33 PM

## 2023-09-15 DIAGNOSIS — H11822 Conjunctivochalasis, left eye: Secondary | ICD-10-CM | POA: Diagnosis not present

## 2023-09-15 DIAGNOSIS — Z961 Presence of intraocular lens: Secondary | ICD-10-CM | POA: Diagnosis not present

## 2023-09-15 DIAGNOSIS — H02403 Unspecified ptosis of bilateral eyelids: Secondary | ICD-10-CM | POA: Diagnosis not present

## 2023-09-15 DIAGNOSIS — B0052 Herpesviral keratitis: Secondary | ICD-10-CM | POA: Diagnosis not present

## 2023-09-17 ENCOUNTER — Telehealth: Payer: Self-pay | Admitting: Pulmonary Disease

## 2023-09-17 NOTE — Telephone Encounter (Signed)
Patient is returning phone call. Patient phone number is [phone].

## 2023-09-17 NOTE — Telephone Encounter (Signed)
PT saw Dr. Rexene Edison on 8/29. States she is still having breathing issues even after the round of pred. Would like to come see him but no appts. Perhaps Dr. Has a plan B. Perhaps more Pred. Pls call her @ [phone]  States can not breath, back is killing her, she is not going to [facility], very agrivated. [phone]

## 2023-09-17 NOTE — Telephone Encounter (Signed)
Called the pt and there was no answer- LMTCB    

## 2023-09-20 MED ORDER — PREDNISONE 20 MG PO TABS: ORAL_TABLET | ORAL | 0 refills | Status: AC

## 2023-09-20 MED ORDER — AMOXICILLIN-POT CLAVULANATE 875-125 MG PO TABS: 1.0000 | ORAL_TABLET | Freq: Two times a day (BID) | ORAL | 0 refills | Status: AC

## 2023-09-20 NOTE — Telephone Encounter (Signed)
Patient returned call.  She expressed understanding with plan.  If not improved in next couple weeks we could try a flouroquinolone.  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

## 2023-09-20 NOTE — Telephone Encounter (Signed)
Called patient, went to voicemail.  Left detailed voicemail with plans for antibiotics and prednisone taper.  Prednisone taper total 10 days and Augmentin twice daily for 14 days sent to [facility].

## 2023-09-20 NOTE — Telephone Encounter (Signed)
Dr. Judeth Horn do you have any recommendations ? Pt states she is still having trouble with her breathing.

## 2023-11-01 ENCOUNTER — Other Ambulatory Visit: Payer: Self-pay | Admitting: Pulmonary Disease

## 2023-11-02 ENCOUNTER — Ambulatory Visit: Payer: Medicare Other | Admitting: Pulmonary Disease

## 2023-11-02 ENCOUNTER — Encounter: Payer: Self-pay | Admitting: Pulmonary Disease

## 2023-11-02 VITALS — BP 114/60 | HR 90 | Ht 63.5 in | Wt 99.0 lb

## 2023-11-02 DIAGNOSIS — J385 Laryngeal spasm: Secondary | ICD-10-CM | POA: Diagnosis not present

## 2023-11-02 DIAGNOSIS — J479 Bronchiectasis, uncomplicated: Secondary | ICD-10-CM | POA: Diagnosis not present

## 2023-11-02 MED ORDER — BENZONATATE 200 MG PO CAPS: 200.0000 mg | ORAL_CAPSULE | Freq: Three times a day (TID) | ORAL | 3 refills | Status: AC | PRN

## 2023-11-02 NOTE — Patient Instructions (Signed)
Nice to see you again  I sent a referral to [facility] pulmonary for ongoing opinion and other ways to help with the bronchiectasis  I sent a referral to the ENT doctors to evaluate the closing of her tightening of the throat  I refilled the cough Perles, higher dose this time  Return to clinic in 3 months or sooner as needed with Dr. Judeth Horn

## 2023-11-02 NOTE — Progress Notes (Signed)
Synopsis: Referred in 2010 for bronchiectasis by Paulina Fusi, MD.  Previously patient of Dr. Stann Mainland and Dr. Kendrick Fries and Dr. Chestine Spore.  Subjective:   PATIENT ID: Kiara Medina GENDER: female DOB: 1946/10/11, MRN: [id number]  Chief Complaint  Patient presents with   Follow-up    F/U visit. Pt c/o Cough and SOB since last visit. 08-12-2023. Pt request refills.   Kiara Medina is a 77 y.o. woman with a history of MDR MAC and bronchiectasis who returns for routine follow-up.  Most recent infectious disease note reviewed.  Cough and breathing seem worse overall.  Consistently worse over the last year or more.  Doing hypertonic saline and vest therapy.  Last month sent antibiotics and steroids.  She got better for a few days but back to baseline.  Suspect worsening of disease.  Discussed at length.  Discussed referral to [facility] for further evaluation which she agrees.  Describes several episodes of what sounds like laryngospasm.  Few minutes or less of tightening in the throat.  Feels like she cannot breathe.  She does not call EMS.  Usually get better after a few minutes.  Eventually puffs of albuterol inhaler and seems to help.  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   Sinusitis      Family History  Problem Relation Age of Onset   Emphysema Sister    Stroke Mother    Clotting disorder Mother      Past Surgical History:  Procedure Laterality Date   APPENDECTOMY     CATARACT EXTRACTION  08/2013   CHOLECYSTECTOMY     TUBAL LIGATION     VESICOVAGINAL FISTULA CLOSURE W/ TAH      Social History   Socioeconomic History   Marital status: Widowed    Spouse name: Not on file   Number of children: Not on file   Years of education: Not on file   Highest education level: Not on file  Occupational History   Occupation: realtor  Tobacco Use    Smoking status: Former    Current packs/day: 0.00    Average packs/day: 1 pack/day for 20.0 years (20.0 ttl pk-yrs)    Types: Cigarettes    Start date: 12/14/1965    Quit date: 12/14/1985    Years since quitting: 37.9   Smokeless tobacco: Never  Vaping Use   Vaping status: Never Used  Substance and Sexual Activity   Alcohol use: Not Currently    Alcohol/week: 1.0 standard drink of alcohol    Types: 1 Glasses of wine per week    Comment: occ   Drug use: No   Sexual activity: Not on file  Other Topics Concern   Not on file  Social History Narrative   Not on file   Social Determinants of Health   Financial Resource Strain: Not on file  Food Insecurity: Not on file  Transportation Needs: Not on file  Physical Activity: Not on file  Stress: Not on file  Social Connections: Not on file  Intimate Partner Violence: Not on file     No Known Allergies   Immunization History  Administered Date(s) Administered   Fluad Quad(high Dose 65+) 09/24/2020   Influenza Split 09/13/2012, 09/06/2014   Influenza Whole 09/09/2009, 08/14/2010, 09/14/2011   Influenza, High Dose Seasonal PF 10/10/2015, 09/02/2021   Influenza,inj,Quad PF,6+ Mos 09/13/2013, 09/21/2016, 08/26/2017, 08/22/2018  Influenza-Unspecified 08/29/2019, 09/21/2022   Moderna Sars-Covid-2 Vaccination 01/03/2020, 01/31/2020, 08/02/2020, 05/06/2021   PNEUMOCOCCAL CONJUGATE-20 08/12/2023   Pfizer Covid-19 Vaccine Bivalent Booster 5y-11y 11/24/2021   Pneumococcal Conjugate-13 11/24/2014   Pneumococcal Polysaccharide-23 07/17/2009   RSV,unspecified 10/30/2022    Outpatient Medications Prior to Visit  Medication Sig Dispense Refill   acetaminophen (TYLENOL) 500 MG tablet Take 1,000 mg by mouth every 6 (six) hours as needed for moderate pain.     acyclovir (ZOVIRAX) 400 MG tablet Take 400 mg by mouth 2 (two) times daily.     albuterol (VENTOLIN HFA) 108 (90 Base) MCG/ACT inhaler Inhale 2 puffs into the lungs every 6 (six) hours as  needed for wheezing or shortness of breath. 18 g 3   alendronate (FOSAMAX) 70 MG tablet Take 70 mg by mouth once a week.     atorvastatin (LIPITOR) 20 MG tablet Take 20 mg by mouth daily.     B Complex Vitamins (B COMPLEX-B12 PO) Take 1 tablet by mouth daily.     Budeson-Glycopyrrol-Formoterol (BREZTRI AEROSPHERE) 160-9-4.8 MCG/ACT AERO Inhale 2 puffs into the lungs in the morning and at bedtime. 10.7 g 4   calcium carbonate (OSCAL) 1500 (600 Ca) MG TABS tablet Take 600 mg of elemental calcium by mouth 2 (two) times daily with a meal.     Cholecalciferol (VITAMIN D3) 125 MCG (5000 UT) TABS Take 1 tablet by mouth daily.     CLOFAZIMINE PO Take 100 mg by mouth daily.     ethambutol (MYAMBUTOL) 400 MG tablet Take 2 tablets (800 mg total) by mouth daily. 180 tablet 11   Flaxseed, Linseed, (FLAX SEED OIL) 1300 MG CAPS Take 1 tablet by mouth daily.     Melatonin 10 MG CAPS Take 10 mg by mouth at bedtime.     omeprazole (PRILOSEC) 40 MG capsule TAKE 1 CAPSULE DAILY 90 capsule 3   Probiotic Product (PROBIOTIC DAILY PO) Take 1 capsule by mouth daily.     Propylene Glycol (SYSTANE BALANCE OP) Place 1 drop into the left eye daily as needed (dry eyes).     Respiratory Therapy Supplies (FLUTTER) DEVI Use as directed. 1 each 0   rifampin (RIFADIN) 300 MG capsule Take 2 capsules (600 mg total) by mouth daily. 180 capsule 11   Spacer/Aero-Holding Chambers (AEROCHAMBER MV) inhaler Use as instructed 1 each 2   TURMERIC PO Take 2,000 mg by mouth daily.      benzonatate (TESSALON PERLES) 100 MG capsule Take 1 capsule (100 mg total) by mouth 3 (three) times daily as needed for cough. 270 capsule 3   levofloxacin (LEVAQUIN) 750 MG tablet Take 750 mg by mouth daily. (Patient not taking: Reported on 06/02/2023)     No facility-administered medications prior to visit.    Review of Systems  N/a   Objective:   Vitals:   11/02/23 1602  BP: 114/60  Pulse: 90  SpO2: 97%  Weight: 99 lb (44.9 kg)  Height: 5' 3.5"  (1.613 m)      97% on   RA BMI Readings from Last 3 Encounters:  11/02/23 17.26 kg/m  09/06/23 17.47 kg/m  08/12/23 17.43 kg/m   Wt Readings from Last 3 Encounters:  11/02/23 99 lb (44.9 kg)  09/06/23 98 lb 9.6 oz (44.7 kg)  08/12/23 98 lb 6.4 oz (44.6 kg)    Physical Exam BP 114/60 (BP Location: Right Arm, Cuff Size: Normal)   Pulse 90   Ht 5' 3.5" (1.613 m)   Wt 99 lb (44.9  kg)   SpO2 97%   BMI 17.26 kg/m  General: Well-appearing in no acute distress, thin Eyes: EOMI, icterus Respiratory: Clear, no wheeze, normal work of breathing Cardiovascular: Regular rhythm, no murmur Extremities: Warm, no edema     CBC    Component Value Date/Time   WBC 10.3 06/02/2023 0322   RBC 4.61 06/02/2023 0322   HGB 13.1 06/02/2023 0322   HCT 40.7 06/02/2023 0322   PLT 272 06/02/2023 0322   MCV 88.3 06/02/2023 0322   MCH 28.4 06/02/2023 0322   MCHC 32.2 06/02/2023 0322   RDW 14.6 06/02/2023 0322   LYMPHSABS 999 06/02/2023 0322   MONOABS 0.5 09/24/2021 0519   EOSABS 31 06/02/2023 0322   BASOSABS 31 06/02/2023 0322    CHEMISTRY No results for input(s): "NA", "K", "CL", "CO2", "GLUCOSE", "BUN", "CREATININE", "CALCIUM", "MG", "PHOS" in the last 168 hours. CrCl cannot be calculated (Patient's most recent lab result is older than the maximum 21 days allowed.).  12/28/2019: IgG 954 IgM 96 IgA 289 IgE 10 IgG subclasses normal (other than increased IgG subclass 3)  Cultures: 02/20/21 - MAI 11/23/2019-MAI 05/17/2019-MAI 03/15/2019-MAI 01/10/2019 MAI 11/07/2018-MAI 10/10/2018-MAI  08/22/2018-MAI 07/11/2018-MAI 05/31/2018-MAI MAI susceptibilities-resistant clarithromycin, linezolid.  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  Susceptible to amikacin, cefepime, clarithromycin,  imipenem, linezolid, trimethoprim sulfamethoxazole. 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  Tree-in-bud opacities diffusely.  Mucus impaction in airways.  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     Assessment & Plan:     ICD-10-CM   1. BRONCHIECTASIS  J47.9 Ambulatory referral to Pulmonology    2. Laryngospasm  J38.5 Ambulatory referral to ENT          Dyspnea on exertion likely due to chronic bronchiectasis and asthma-improved on LAMA/LABA; worsed when back off these meds. Component of deconditioning.  Has seasonal allergies, describes bronchospasm. - Unclear benefit with inhalers in the past, (Stiolto and Trelegy), mild improvement on Breztri, to continue with spacer -Continue using albuterol as needed --TTE reassuring 2022 -- s/p pulmonary rehab  Asthma with acute exacerbation: Cough less productive.  More like bronchospasm.  Worried about exacerbation.  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  Progressive on CT over the last several years.  No evidence of hypersensitivity pneumonitis from inhaled amikacin on CT scan.  Amikacin stopped due to worsening of cough, subjective cough.  Unfortunate she has had progressive disease over time.   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  Suspect much of what is left is just palliation.  Reasonable to assess for second opinion and possible clinical trials etc. - Hypertonic saline to twice a day  -- Continue vest therapy -Continue current multi drug MAI therapy and ID follow-up.  I agree with  Dr. Algis Liming that she has limited treatment options and would likely progress faster off meds --stopped inhaled amikacin once again 06/2022 due to worsening cough -CT chest hi res 02/2022 with worsened signs of NTM disease -- Referral to [facility] pulmonary for second opinion, other possible clinical trials etc. for MDR NTM/bronchiectasis  Weight loss- concerned this may be a MAC secondary effect, complicated by fatigue and DOE.  Related to metabolic needs from dyspnea, cough.  Poor appetite.  This is ongoing. Weight slowly trending down  Healthcare maintenance: -- Prevnar 20 07/2023  RTC in 3 months with Dr. Judeth Horn.   I spent 41 minutes in care of patient including face-to-face visit, review of records, coordination of care.   Current Outpatient Medications:    benzonatate (TESSALON) 200 MG capsule, Take 1 capsule (200 mg total) by mouth 3 (three) times daily as needed for cough., Disp: 90 capsule, Rfl: 3   acetaminophen (TYLENOL) 500 MG tablet, Take 1,000 mg by mouth every 6 (six) hours as needed for moderate pain., Disp: , Rfl:    acyclovir (ZOVIRAX) 400 MG tablet, Take 400 mg by mouth 2 (two) times daily., Disp: , Rfl:    albuterol (VENTOLIN HFA) 108 (90 Base) MCG/ACT inhaler, Inhale 2 puffs into the lungs every 6 (six) hours as needed for wheezing or shortness of breath., Disp: 18 g, Rfl: 3   alendronate (FOSAMAX) 70 MG tablet, Take 70 mg by mouth once a week., Disp: , Rfl:    atorvastatin (LIPITOR) 20 MG tablet, Take 20 mg by mouth daily., Disp: , Rfl:    B Complex Vitamins (B COMPLEX-B12 PO), Take 1 tablet by mouth daily., Disp: , Rfl:    Budeson-Glycopyrrol-Formoterol (BREZTRI AEROSPHERE) 160-9-4.8 MCG/ACT AERO,  Inhale 2 puffs into the lungs in the morning and at bedtime., Disp: 10.7 g, Rfl: 4   calcium carbonate (OSCAL) 1500 (600 Ca) MG TABS tablet, Take 600 mg of elemental calcium by mouth 2 (two) times daily with a meal., Disp: , Rfl:    Cholecalciferol (VITAMIN D3) 125 MCG (5000 UT) TABS, Take 1 tablet by mouth daily., Disp: , Rfl:    CLOFAZIMINE PO, Take 100 mg by mouth daily., Disp: , Rfl:    ethambutol (MYAMBUTOL) 400 MG tablet, Take 2 tablets (800 mg total) by mouth daily., Disp: 180 tablet, Rfl: 11   Flaxseed, Linseed, (FLAX SEED OIL) 1300 MG CAPS, Take 1 tablet by mouth daily., Disp: , Rfl:    Melatonin 10 MG CAPS, Take 10 mg by mouth at bedtime., Disp: , Rfl:    omeprazole (PRILOSEC) 40 MG capsule, TAKE 1 CAPSULE DAILY, Disp: 90 capsule, Rfl: 3   Probiotic Product (PROBIOTIC DAILY PO), Take 1 capsule by mouth daily., Disp: , Rfl:    Propylene Glycol (SYSTANE BALANCE OP), Place 1 drop into the left eye daily as needed (dry eyes)., Disp: , Rfl:    Respiratory Therapy Supplies (FLUTTER) DEVI, Use as directed., Disp: 1 each, Rfl: 0   rifampin (RIFADIN) 300 MG capsule, Take 2 capsules (600 mg total) by mouth daily., Disp: 180 capsule, Rfl: 11   Spacer/Aero-Holding Chambers (AEROCHAMBER MV) inhaler, Use as instructed, Disp: 1 each, Rfl: 2   TURMERIC PO, Take 2,000 mg by mouth daily. , Disp: , Rfl:

## 2023-11-03 ENCOUNTER — Telehealth: Payer: Self-pay | Admitting: Otolaryngology

## 2023-11-03 NOTE — Telephone Encounter (Signed)
Called and phone went straight to vmail, I lvmail for pt to call office 11-03-23

## 2023-11-08 ENCOUNTER — Encounter (INDEPENDENT_AMBULATORY_CARE_PROVIDER_SITE_OTHER): Payer: Self-pay | Admitting: Otolaryngology

## 2023-11-08 ENCOUNTER — Ambulatory Visit (INDEPENDENT_AMBULATORY_CARE_PROVIDER_SITE_OTHER): Payer: Medicare Other | Admitting: Otolaryngology

## 2023-11-08 VITALS — BP 117/66 | HR 91 | Ht 63.5 in | Wt 99.0 lb

## 2023-11-08 DIAGNOSIS — R490 Dysphonia: Secondary | ICD-10-CM

## 2023-11-08 DIAGNOSIS — A310 Pulmonary mycobacterial infection: Secondary | ICD-10-CM

## 2023-11-08 DIAGNOSIS — R1310 Dysphagia, unspecified: Secondary | ICD-10-CM | POA: Diagnosis not present

## 2023-11-08 DIAGNOSIS — K219 Gastro-esophageal reflux disease without esophagitis: Secondary | ICD-10-CM | POA: Diagnosis not present

## 2023-11-08 DIAGNOSIS — J383 Other diseases of vocal cords: Secondary | ICD-10-CM

## 2023-11-08 DIAGNOSIS — R0982 Postnasal drip: Secondary | ICD-10-CM

## 2023-11-08 DIAGNOSIS — R053 Chronic cough: Secondary | ICD-10-CM

## 2023-11-08 DIAGNOSIS — J479 Bronchiectasis, uncomplicated: Secondary | ICD-10-CM

## 2023-11-08 MED ORDER — MOMETASONE FUROATE 50 MCG/ACT NA SUSP: 2.0000 | Freq: Two times a day (BID) | NASAL | 12 refills | Status: AC

## 2023-11-08 NOTE — Patient Instructions (Addendum)
-   schedule swallow study  - continue Omeprazole and try reflux gourmet  - try to research what causes reflux  - Take Reflux Gourmet (natural supplement available on [employer]) to help with symptoms of chronic throat irritation     Lloyd Huger Med Nasal Saline Rinse  - start Nasonex for post-nasal drainage  - start nasal saline rinses with [employer] Bottle available over the counter or online to help with nasal congestion

## 2023-11-08 NOTE — Progress Notes (Unsigned)
ENT CONSULT:  Reason for Consult: chronic cough, acutely worse ~ 12 mo  HPI: Discussed the use of AI scribe software for clinical note transcription with the patient, who gave verbal consent to proceed.  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 They describe episodes of their throat "locking up," leading to difficulty breathing and bouts of intense coughing. These episodes often occur spontaneously, even when not eating or talking, and can be severe enough to cause the patient to gasp for breath. The patient notes that these episodes are more frequent during or after eating, suggesting a possible swallowing issue, although they do not report any noticeable difficulty with swallowing.  The patient also reports a sensation of constant throat tightness and has a reduced appetite, although they are able to eat a regular diet when they do feel hungry. They had an incident of choking in a restaurant earlier this year while eating a [place] steak and cheese sandwich, which has led them to be more cautious, particularly with meat and bread.  The patient has a history of dryness in the mouth, and drinks a lot of water to help with the coughing (ice water tends to prevent bouts of cough). They report no issues with heartburn or reflux. They do experience shortness of breath outside of their coughing episodes, which is managed with an inhaler. The coughing episodes and sensation of choking can occur at any time of day, but the patient notes that they are more frequent in the mornings. She does have productive cough from time to time.   The patient's MAC infection is managed with three daily antibiotics, which they expect to continue for life. They have attempted a bronchoscopy in the past, but it was unsuccessful due to  excessive coughing. The patient has also experienced significant weight loss over the past two years, dropping from 125 pounds to their current weight of 99 lb. She thinks it is due to appetite issues and currently trying to eat more frequent meals.      Records Reviewed:  Pulm office visit 11/02/23 Ms. Higbie is a 77 y.o. woman with a history of MDR MAC and bronchiectasis who returns for routine follow-up.  Most recent infectious disease note reviewed.   Cough and breathing seem worse overall.  Consistently worse over the last year or more.  Doing hypertonic saline and vest therapy.  Last month sent antibiotics and steroids.  She got better for a few days but back to baseline.  Suspect worsening of disease.  Discussed at length.  Discussed referral to [facility] for further evaluation which she agrees.  Describes several episodes of what sounds like laryngospasm.  Few minutes or less of tightening in the throat.  Feels like she cannot breathe.  She does not call EMS.  Usually get better after a few minutes.  Eventually puffs of albuterol inhaler and seems to help.      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   Sinusitis     Past Surgical History:  Procedure Laterality Date   APPENDECTOMY     CATARACT EXTRACTION  08/2013   CHOLECYSTECTOMY     TUBAL LIGATION     VESICOVAGINAL  FISTULA CLOSURE W/ TAH      Family History  Problem Relation Age of Onset   Emphysema Sister    Stroke Mother    Clotting disorder Mother     Social History:  reports that she quit smoking about 37 years ago. Her smoking use included cigarettes. She started smoking about 57 years ago. She has a 20 pack-year smoking history. She has never used smokeless tobacco. She reports that she does not currently use alcohol after a past usage of about 1.0 standard drink of alcohol per  week. She reports that she does not use drugs.  Allergies: No Known Allergies  Medications: I have reviewed the patient's current medications.  The PMH, PSH, Medications, Allergies, and SH were reviewed and updated.  ROS: Constitutional: Negative for fever, weight loss and weight gain. Cardiovascular: Negative for chest pain and dyspnea on exertion. Respiratory: Is not experiencing shortness of breath at rest. Gastrointestinal: Negative for nausea and vomiting. Neurological: Negative for headaches. Psychiatric: The patient is not nervous/anxious  Blood pressure 117/66, pulse 91, height 5' 3.5" (1.613 m), weight 99 lb (44.9 kg), SpO2 97%.  PHYSICAL EXAM:  Exam: General: Well-developed, well-nourished Communication and Voice: Clear pitch and clarity Respiratory Respiratory effort: Equal inspiration and expiration without stridor Cardiovascular Peripheral Vascular: Warm extremities with equal color/perfusion Eyes: No nystagmus with equal extraocular motion bilaterally Neuro/Psych/Balance: Patient oriented to person, place, and time; Appropriate mood and affect; Gait is intact with no imbalance; Cranial nerves I-XII are intact Head and Face Inspection: Normocephalic and atraumatic without mass or lesion Palpation: Facial skeleton intact without bony stepoffs Salivary Glands: No mass or tenderness Facial Strength: Facial motility symmetric and full bilaterally ENT Pinna: External ear intact and fully developed External canal: Canal is patent with intact skin Tympanic Membrane: Clear and mobile External Nose: No scar or anatomic deformity Internal Nose: Septum is deviated to the left. No polyp, or purulence. Mucosal edema and erythema present.  Bilateral inferior turbinate hypertrophy.  Lips, Teeth, and gums: Mucosa and teeth intact and viable TMJ: No pain to palpation with full mobility Oral cavity/oropharynx: No erythema or exudate, no lesions present Nasopharynx: No mass or  lesion with intact mucosa Hypopharynx: Intact mucosa without pooling of secretions Larynx Glottic: Full true vocal cord mobility without lesion or mass but evidence of VF atrophy  Supraglottic: Normal appearing epiglottis and AE folds Interarytenoid Space: Moderate pachydermia/edema Subglottic Space: Patent without lesion or edema Neck Neck and Trachea: Midline trachea without mass or lesion Thyroid: No mass or nodularity Lymphatics: No lymphadenopathy  Procedure:  Preoperative diagnosis: chronic cough   Postoperative diagnosis:   Same + GERD LPR + post-nasal drainage   Procedure: Flexible fiberoptic laryngoscopy  Surgeon: Ashok Croon, MD  Anesthesia: Topical lidocaine and Afrin Complications: None Condition is stable throughout exam  Indications and consent:  The patient presents to the clinic with Indirect laryngoscopy view was incomplete. Thus it was recommended that they undergo a flexible fiberoptic laryngoscopy. All of the risks, benefits, and potential complications were reviewed with the patient preoperatively and verbal informed consent was obtained.  Procedure: The patient was seated upright in the clinic. Topical lidocaine and Afrin were applied to the nasal cavity. After adequate anesthesia had occurred, I then proceeded to pass the flexible telescope into the nasal cavity. The nasal cavity was patent without rhinorrhea or polyp. The nasopharynx was also patent without mass or lesion. The base of tongue was visualized and was normal. There were no signs of pooling of secretions in  the piriform sinuses. The true vocal folds were mobile bilaterally. There were no signs of glottic or supraglottic mucosal lesion or mass. There was moderate interarytenoid pachydermia and post cricoid edema. The telescope was then slowly withdrawn and the patient tolerated the procedure throughout.     Studies Reviewed: CT chest 02/10/2022 EXAM: CT CHEST WITHOUT CONTRAST    TECHNIQUE: Multidetector CT imaging of the chest was performed following the standard protocol without intravenous contrast. High resolution imaging of the lungs, as well as inspiratory and expiratory imaging, was performed.   RADIATION DOSE REDUCTION: This exam was performed according to the departmental dose-optimization program which includes automated exposure control, adjustment of the mA and/or kV according to patient size and/or use of iterative reconstruction technique.   COMPARISON:  04/10/2020   FINDINGS: Cardiovascular: Aortic atherosclerosis. Normal heart size. Left and right coronary artery calcifications. No pericardial effusion.   Mediastinum/Nodes: Unchanged prominent mediastinal and hilar lymph nodes. Thyroid gland, trachea, and esophagus demonstrate no significant findings.   Lungs/Pleura: Redemonstrated, very extensive, diffuse bilateral bronchiectasis, bronchiolar thickening, and plugging, with very extensive clustered centrilobular and tree-in-bud nodularity as well as confluent consolidations and cavitary lesions, most conspicuously in the right lower lobe (series 4, image 216) and right apex (series 4, image 71), although seen in all lobes. These findings are substantially increased in comparison to prior examination dated 04/03/2020, particularly with increased nodularity and cavitation in the right lung base and right apex. No significant air trapping on expiratory phase imaging. No pleural effusion or pneumothorax.   Upper Abdomen: No acute abnormality.   Musculoskeletal: No chest wall abnormality. No suspicious osseous lesions identified.   IMPRESSION: 1. Redemonstrated, very extensive, diffuse bilateral bronchiectasis, bronchiolar thickening, and plugging, with very extensive clustered centrilobular and tree-in-bud nodularity as well as confluent consolidations and cavitary lesions, substantially increased in comparison to prior examination,  particularly with worsened nodularity and cavitation in the right lung. Findings are consistent with severe, significantly worsened MAC infection. 2. Coronary artery disease.  10/29/22 CLINICAL DATA:  Acute exacerbation of chronic bronchitis.   EXAM: CHEST - 2 VIEW   COMPARISON:  November 23, 2019 chest x-ray. CT scan of the chest February 10, 2022 - personally reviewed   FINDINGS: Bilateral pulmonary infiltrates, right greater than left and bronchiectasis, also right greater than left, persists. The findings are likely similar compared to February 10, 2022 given the scout view of the comparison CT scan. The cardiomediastinal silhouette is stable. No pneumothorax. No other acute abnormalities.   IMPRESSION: Chronic infiltrates remain in the lungs. Findings are similar since February 10, 2022 although it is difficult to compare today's chest x-ray to the CT scan scout view. An acute on chronic infiltrate is not excluded. CT imaging could better assess for changes.  Labs per Pulm office visit 12/28/2019: IgG 954 IgM 96 IgA 289 IgE 10 IgG subclasses normal (other than increased IgG subclass 3)   Cultures: 02/20/21 - MAI 11/23/2019-MAI 05/17/2019-MAI 03/15/2019-MAI 01/10/2019 MAI 11/07/2018-MAI 10/10/2018-MAI  08/22/2018-MAI 07/11/2018-MAI 05/31/2018-MAI MAI susceptibilities-resistant clarithromycin, linezolid.  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  Susceptible to amikacin, cefepime, clarithromycin, imipenem, linezolid, trimethoprim sulfamethoxazole. 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  Tree-in-bud opacities diffusely.  Mucus impaction in airways.   HRCT chest 02/12/2020 same burden of  bronchiectasis and cavitary nodules reflective of worsening NTM disease   Pulmonary Functions Testing Results:     Latest Ref Rng & Units 04/18/2018    2:40 PM  PFT Results  FVC-Pre L 2.08   FVC-Predicted Pre % 79   Pre FEV1/FVC % % 75   FEV1-Pre L 1.57   FEV1-Predicted Pre % 80     2019-no significant obstruction, mildly reduced FVC.    Assessment/Plan: Encounter Diagnoses  Name Primary?   Chronic cough Yes   Dysphagia, unspecified type    Dysphonia    Mycobacterium avium-intracellulare complex (HCC)    Chronic GERD    Age-related vocal fold atrophy    Glottic insufficiency    Post-nasal drip     Assessment and Plan    Chronic Cough with Possible Laryngospasm - we discussed most common etiology for chronic cough including PND, lung issues and GERD LPR Chronic cough with episodes of throat tightness and choking, particularly during meals. Possible laryngospasm due to post-nasal drainage and reflux irritation, both of which were noted on scope exam today. No masses, tumors, or lesions observed on vocal cords, but she did have age-related VF atrophy. Vocal cords appear healthy but with some age-related muscle loss. Discussed swallow study to evaluate swallowing function and rule out potential aspiration. She also had findings on scope exam c/w GERD LPR. Discussed nasal rinses and nasal spray to reduce post-nasal drainage and improve symptoms. Recommended lifestyle modifications to reduce reflux, including avoiding late meals and reducing coffee intake. - Order swallow study - MBS and esophagram  - Recommended nasal rinses with saline [facility]) - Prescribed Nasonex nasal spray twice daily 2 puffs b/l nares - Recommend reflux gourmet trial  - Continue omeprazole 40 mg daily  - Educate on lifestyle modifications to reduce reflux, including avoiding late meals and  reducing coffee intake  Chronic Bronchiectasis, based on record review overall worsening clinical picture, evidence of MAC on cx, on vest therapy, chronic abx, and hypertonic saline, f/b Pulm  Long-standing bronchiectasis with increased coughing severity this year. Discussed chronic nature of the condition and need for ongoing f/u and management with Pulm. Discussed this could be the primary cause of her chronic cough - Continue current antibiotic regimen - Continue use of inhaler as needed for shortness of breath  GERD LPR possible laryngospasm sx from under-treated GERD LPR and sx of dysphagia choking  - Recommend reflux gourmet trial  - Continue omeprazole 40 mg daily   Dysphagia sx, episode of choking on a sandwich requiring Heimlich maneuver, choking and coughing with food, and some weight loss ~ couple of years  Ddx oropharyngeal vs esophageal dysphagia  - Order swallow study - MBS and esophagram   Nasal congestion and Post-nasal drainage  - Nasonex BID as above   Dysphonia and VF atrophy on scope exam - age related  - continue to observe, will consider strobe in the future  - will consider speech therapy in the future   General Health Maintenance Discussed importance of nasal hygiene to reduce crusting and post-nasal drainage. Recommended daily nasal rinses with saline and use of Vaseline for nasal crusting. - Recommend daily nasal rinses with saline - Advise use of Vaseline for nasal crusting  Follow-up - Schedule follow-up appointment in a few weeks after swallow study completion - Ensure patient contacts radiology and speech therapy for scheduling the swallow study.        Thank you for allowing me to participate in the care of this patient. Please  do not hesitate to contact me with any questions or concerns.   Ashok Croon, MD Otolaryngology [facility] Phone: [phone] Fax: [phone]    11/09/2023, 8:45 AM

## 2023-11-10 ENCOUNTER — Other Ambulatory Visit (HOSPITAL_COMMUNITY): Payer: Self-pay | Admitting: Otolaryngology

## 2023-11-10 ENCOUNTER — Telehealth: Payer: Self-pay | Admitting: Pulmonary Disease

## 2023-11-10 DIAGNOSIS — R1310 Dysphagia, unspecified: Secondary | ICD-10-CM

## 2023-11-10 DIAGNOSIS — R059 Cough, unspecified: Secondary | ICD-10-CM

## 2023-11-10 NOTE — Telephone Encounter (Signed)
[facility] needs demographic information and any scans that the patients have had. It can be done via power shower,disc or just send over the reports.

## 2023-11-18 ENCOUNTER — Ambulatory Visit (HOSPITAL_COMMUNITY): Payer: Medicare Other

## 2023-11-22 ENCOUNTER — Other Ambulatory Visit: Payer: Self-pay

## 2023-11-22 ENCOUNTER — Ambulatory Visit: Payer: Medicare Other | Admitting: Infectious Disease

## 2023-11-22 ENCOUNTER — Encounter: Payer: Self-pay | Admitting: Infectious Disease

## 2023-11-22 VITALS — BP 138/73 | HR 80 | Temp 97.3°F | Wt 99.0 lb

## 2023-11-22 DIAGNOSIS — J479 Bronchiectasis, uncomplicated: Secondary | ICD-10-CM

## 2023-11-22 DIAGNOSIS — A310 Pulmonary mycobacterial infection: Secondary | ICD-10-CM

## 2023-11-22 DIAGNOSIS — J4489 Other specified chronic obstructive pulmonary disease: Secondary | ICD-10-CM

## 2023-11-22 NOTE — Progress Notes (Signed)
Subjective:  Chief complaint follow-up for Mycobacterium avium infection     Patient ID: Kiara Medina, female    DOB: 1946/11/04, 77 y.o.   MRN: [id number]  HPI   Kiara Medina is a a 77 year old woman followed previously by my (now retired) partner Dr. 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  She was not treated at that time.  She tested positive again in March 2015.  She started on azithromycin, ethambutol and rifampin in December 2015 and completed the course of therapy in November 2016.   She relapsed in June 2018 and started on azithromycin, ethambutol and rifampin again.  She saw Dr. Zackery Barefoot at [facility] in November 2018.  Aerosolized amikacin was added in January 2019  because of evolving resistance.  In September 2019 azithromycin was changed to clofazimine and she continued ethambutol, rifampin and aerosolized amikacin.  Cayston was later stopped due to the fact that inhaling it seemed to be causing more problems with coughing.  At last visit she seemed to been relatively stable since then in terms of her coughing and her weight has been stable in the 100 to 103 pound range.  Discussed the use of AI scribe software for clinical note transcription with the patient, who gave verbal consent to proceed.  History of Present Illness   The patient, with a history of a resistant mycobacterial infection, presents with a worsening cough and difficulty swallowing. The coughing spells are severe, causing breathlessness and fear. The patient reports that these spells seem to come out of nowhere and are not necessarily associated with increased phlegm production. The difficulty swallowing is particularly noticeable during or after meals. The patient also reports a loss of taste, which has been ongoing for approximately two years. This has resulted in a decreased appetite and significant weight loss. The patient is currently on rifampin,  ethambutol, and clofazamine for the mycobacterial infection. They have tried inhaled amikacin in the past, but it did not make a difference. The patient is also on omeprazole for heartburn.        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   Sinusitis     Past Surgical History:  Procedure Laterality Date   APPENDECTOMY     CATARACT EXTRACTION  08/2013   CHOLECYSTECTOMY     TUBAL LIGATION     VESICOVAGINAL FISTULA CLOSURE W/ TAH      Family History  Problem Relation Age of Onset   Emphysema Sister    Stroke Mother    Clotting disorder Mother       Social History   Socioeconomic History   Marital status: Widowed    Spouse name: Not on file   Number of children: Not on file   Years of education: Not on file   Highest education level: Not on file  Occupational History   Occupation: realtor  Tobacco Use   Smoking status: Former    Current packs/day: 0.00    Average packs/day: 1 pack/day for 20.0 years (20.0 ttl pk-yrs)    Types: Cigarettes    Start date: 12/14/1965    Quit date: 12/14/1985    Years since quitting: 37.9   Smokeless tobacco: Never  Vaping Use   Vaping status: Never Used  Substance and Sexual Activity   Alcohol use: Not  Currently    Alcohol/week: 1.0 standard drink of alcohol    Types: 1 Glasses of wine per week    Comment: occ   Drug use: No   Sexual activity: Not on file  Other Topics Concern   Not on file  Social History Narrative   Not on file   Social Determinants of Health   Financial Resource Strain: Not on file  Food Insecurity: Not on file  Transportation Needs: Not on file  Physical Activity: Not on file  Stress: Not on file  Social Connections: Not on file    No Known Allergies   Current Outpatient Medications:    acetaminophen (TYLENOL) 500 MG tablet, Take 1,000 mg by mouth every 6  (six) hours as needed for moderate pain., Disp: , Rfl:    acyclovir (ZOVIRAX) 400 MG tablet, Take 400 mg by mouth 2 (two) times daily., Disp: , Rfl:    albuterol (VENTOLIN HFA) 108 (90 Base) MCG/ACT inhaler, Inhale 2 puffs into the lungs every 6 (six) hours as needed for wheezing or shortness of breath., Disp: 18 g, Rfl: 3   alendronate (FOSAMAX) 70 MG tablet, Take 70 mg by mouth once a week., Disp: , Rfl:    atorvastatin (LIPITOR) 20 MG tablet, Take 20 mg by mouth daily., Disp: , Rfl:    B Complex Vitamins (B COMPLEX-B12 PO), Take 1 tablet by mouth daily., Disp: , Rfl:    benzonatate (TESSALON) 200 MG capsule, Take 1 capsule (200 mg total) by mouth 3 (three) times daily as needed for cough., Disp: 90 capsule, Rfl: 3   BREZTRI AEROSPHERE 160-9-4.8 MCG/ACT AERO, Inhale 2 puffs into the lungs in the morning and at bedtime., Disp: 10.7 g, Rfl: 4   calcium carbonate (OSCAL) 1500 (600 Ca) MG TABS tablet, Take 600 mg of elemental calcium by mouth 2 (two) times daily with a meal., Disp: , Rfl:    Cholecalciferol (VITAMIN D3) 125 MCG (5000 UT) TABS, Take 1 tablet by mouth daily., Disp: , Rfl:    CLOFAZIMINE PO, Take 100 mg by mouth daily., Disp: , Rfl:    ethambutol (MYAMBUTOL) 400 MG tablet, Take 2 tablets (800 mg total) by mouth daily., Disp: 180 tablet, Rfl: 11   Flaxseed, Linseed, (FLAX SEED OIL) 1300 MG CAPS, Take 1 tablet by mouth daily., Disp: , Rfl:    Melatonin 10 MG CAPS, Take 10 mg by mouth at bedtime., Disp: , Rfl:    mometasone (NASONEX) 50 MCG/ACT nasal spray, Place 2 sprays into the nose 2 (two) times daily., Disp: 17 each, Rfl: 12   omeprazole (PRILOSEC) 40 MG capsule, TAKE 1 CAPSULE DAILY, Disp: 90 capsule, Rfl: 3   Probiotic Product (PROBIOTIC DAILY PO), Take 1 capsule by mouth daily., Disp: , Rfl:    Propylene Glycol (SYSTANE BALANCE OP), Place 1 drop into the left eye daily as needed (dry eyes)., Disp: , Rfl:    Respiratory Therapy Supplies (FLUTTER) DEVI, Use as directed., Disp: 1  each, Rfl: 0   rifampin (RIFADIN) 300 MG capsule, Take 2 capsules (600 mg total) by mouth daily., Disp: 180 capsule, Rfl: 11   Spacer/Aero-Holding Chambers (AEROCHAMBER MV) inhaler, Use as instructed, Disp: 1 each, Rfl: 2   TURMERIC PO, Take 2,000 mg by mouth daily. , Disp: , Rfl:   Review of Systems  Constitutional:  Negative for activity change, appetite change, chills, diaphoresis, fatigue, fever and unexpected weight change.  HENT:  Negative for congestion, rhinorrhea, sinus pressure, sneezing, sore throat and trouble swallowing.  Eyes:  Negative for photophobia and visual disturbance.  Respiratory:  Positive for cough and shortness of breath. Negative for chest tightness, wheezing and stridor.   Cardiovascular:  Negative for chest pain, palpitations and leg swelling.  Gastrointestinal:  Negative for abdominal distention, abdominal pain, anal bleeding, blood in stool, constipation, diarrhea, nausea and vomiting.  Genitourinary:  Negative for difficulty urinating, dysuria, flank pain and hematuria.  Musculoskeletal:  Negative for arthralgias, back pain, gait problem, joint swelling and myalgias.  Skin:  Negative for color change, pallor, rash and wound.  Neurological:  Negative for dizziness, tremors, weakness, light-headedness and headaches.  Hematological:  Negative for adenopathy. Does not bruise/bleed easily.  Psychiatric/Behavioral:  Negative for agitation, behavioral problems, confusion, decreased concentration, dysphoric mood, sleep disturbance and suicidal ideas.        Objective:   Physical Exam Constitutional:      General: She is not in acute distress.    Appearance: Normal appearance. She is well-developed. She is not ill-appearing or diaphoretic.  HENT:     Head: Normocephalic and atraumatic.     Right Ear: Hearing and external ear normal.     Left Ear: Hearing and external ear normal.     Nose: No nasal deformity or rhinorrhea.  Eyes:     General: No scleral  icterus.    Conjunctiva/sclera: Conjunctivae normal.     Right eye: Right conjunctiva is not injected.     Left eye: Left conjunctiva is not injected.     Pupils: Pupils are equal, round, and reactive to light.  Neck:     Vascular: No JVD.  Cardiovascular:     Rate and Rhythm: Normal rate and regular rhythm.     Heart sounds: Normal heart sounds, S1 normal and S2 normal. No murmur heard.    No friction rub. No gallop.  Pulmonary:     Effort: Prolonged expiration present.     Breath sounds: Normal breath sounds. No stridor or transmitted upper airway sounds.  Abdominal:     General: Bowel sounds are normal. There is no distension.  Musculoskeletal:        General: Normal range of motion.     Right shoulder: Normal.     Left shoulder: Normal.     Cervical back: Normal range of motion and neck supple.     Right hip: Normal.     Left hip: Normal.     Right knee: Normal.     Left knee: Normal.  Lymphadenopathy:     Head:     Right side of head: No submandibular, preauricular or posterior auricular adenopathy.     Left side of head: No submandibular, preauricular or posterior auricular adenopathy.     Cervical: No cervical adenopathy.     Right cervical: No superficial or deep cervical adenopathy.    Left cervical: No superficial or deep cervical adenopathy.  Skin:    General: Skin is warm and dry.     Coloration: Skin is not pale.     Findings: No abrasion, bruising, ecchymosis, erythema, lesion or rash.     Nails: There is no clubbing.  Neurological:     Mental Status: She is alert and oriented to person, place, and time.     Sensory: No sensory deficit.     Coordination: Coordination normal.     Gait: Gait normal.  Psychiatric:        Attention and Perception: She is attentive.        Speech: Speech normal.  Behavior: Behavior normal. Behavior is cooperative.        Thought Content: Thought content normal.        Judgment: Judgment normal.            Assessment & Plan:   Assessment and Plan    Mycobacterial Lung Infection Chronic infection resistant to multiple antibiotics including erythromycin. Currently on rifampin, ethambutol, and clofazamine. Inhaled amikacin was not effective and systemic amikacin is not an option due to risk of ototoxicity. Patient has been experiencing worsening coughing spells, but cough is NOT typically very productive ands she is not losing further weight having drenching night sweats or other systemic symptoms to suggest poorly controlled M avium Unfortunately the rifampin is now tier 3 with Medicare. i do not know if this has to do with her particular plan but she said that an agent suggested we might be able to appeal and get the price reduced   -will work with our pharmacy staff to see if this can be accomplished -Continue current antibiotic regimen.   Chronic Cough Worsening coughing spells, sometimes severe enough to cause breathlessness. Patient is currently using albuterol inhaler with some relief and a steroid inhaler (Breo) twice daily. -Continue albuterol as needed and Brezti twice daily.  Possible Aspiration Patient reports difficulty swallowing, particularly during or after meals. No significant weight loss reported. -Scheduled for swallowing test and esophagram on 11/26/2023 to evaluate for possible aspiration.  Follow-up Patient prefers to maintain regular follow-up appointments every three months due to the chronic and changing nature of her condition. -Schedule next follow-up appointment in three months.        Marland Kitchen

## 2023-11-24 DIAGNOSIS — Z961 Presence of intraocular lens: Secondary | ICD-10-CM | POA: Diagnosis not present

## 2023-11-24 DIAGNOSIS — H11131 Conjunctival pigmentations, right eye: Secondary | ICD-10-CM | POA: Diagnosis not present

## 2023-11-24 DIAGNOSIS — H02403 Unspecified ptosis of bilateral eyelids: Secondary | ICD-10-CM | POA: Diagnosis not present

## 2023-11-24 DIAGNOSIS — B0052 Herpesviral keratitis: Secondary | ICD-10-CM | POA: Diagnosis not present

## 2023-11-25 NOTE — Telephone Encounter (Signed)
Please forward this request to medical records to have this documented and they can burn a disc, etc.  Thank you.

## 2023-11-26 ENCOUNTER — Encounter (HOSPITAL_COMMUNITY): Payer: Medicare Other

## 2023-11-26 ENCOUNTER — Ambulatory Visit (HOSPITAL_COMMUNITY): Payer: Medicare Other

## 2023-11-29 DIAGNOSIS — K219 Gastro-esophageal reflux disease without esophagitis: Secondary | ICD-10-CM | POA: Diagnosis not present

## 2023-11-29 DIAGNOSIS — A310 Pulmonary mycobacterial infection: Secondary | ICD-10-CM | POA: Diagnosis not present

## 2023-11-29 DIAGNOSIS — E785 Hyperlipidemia, unspecified: Secondary | ICD-10-CM | POA: Diagnosis not present

## 2023-11-29 DIAGNOSIS — E559 Vitamin D deficiency, unspecified: Secondary | ICD-10-CM | POA: Diagnosis not present

## 2023-11-29 DIAGNOSIS — R7301 Impaired fasting glucose: Secondary | ICD-10-CM | POA: Diagnosis not present

## 2023-11-30 ENCOUNTER — Telehealth: Payer: Self-pay | Admitting: Pharmacist

## 2023-11-30 NOTE — Telephone Encounter (Signed)
2 bottles of clofazimine are located in the pharmacy office when patient needs a refill.  Shreshta Medley L. Jannette Fogo, PharmD, BCIDP, AAHIVP, CPP Clinical Pharmacist Practitioner Infectious Diseases Clinical Pharmacist [facility] 11/30/2023, 11:16 AM

## 2023-12-16 ENCOUNTER — Other Ambulatory Visit (HOSPITAL_COMMUNITY): Payer: Self-pay

## 2023-12-17 ENCOUNTER — Other Ambulatory Visit (HOSPITAL_COMMUNITY): Payer: Medicare Other

## 2023-12-17 ENCOUNTER — Encounter (HOSPITAL_COMMUNITY): Payer: Self-pay

## 2023-12-17 ENCOUNTER — Encounter (HOSPITAL_COMMUNITY): Payer: Medicare Other

## 2023-12-17 ENCOUNTER — Inpatient Hospital Stay (HOSPITAL_COMMUNITY): Admission: RE | Admit: 2023-12-17 | Payer: Medicare Other | Source: Ambulatory Visit

## 2023-12-20 ENCOUNTER — Telehealth: Payer: Self-pay | Admitting: Pulmonary Disease

## 2023-12-20 NOTE — Telephone Encounter (Signed)
 Marland Kitchen

## 2023-12-22 NOTE — Telephone Encounter (Signed)
 NFN

## 2023-12-23 DIAGNOSIS — K080 Exfoliation of teeth due to systemic causes: Secondary | ICD-10-CM | POA: Diagnosis not present

## 2023-12-30 DIAGNOSIS — D485 Neoplasm of uncertain behavior of skin: Secondary | ICD-10-CM | POA: Diagnosis not present

## 2024-01-07 DIAGNOSIS — Z8619 Personal history of other infectious and parasitic diseases: Secondary | ICD-10-CM | POA: Diagnosis not present

## 2024-01-07 DIAGNOSIS — J470 Bronchiectasis with acute lower respiratory infection: Secondary | ICD-10-CM | POA: Diagnosis not present

## 2024-01-07 DIAGNOSIS — J479 Bronchiectasis, uncomplicated: Secondary | ICD-10-CM | POA: Diagnosis not present

## 2024-01-10 ENCOUNTER — Telehealth: Payer: Self-pay | Admitting: Pulmonary Disease

## 2024-01-10 ENCOUNTER — Telehealth: Payer: Self-pay

## 2024-01-10 DIAGNOSIS — H02403 Unspecified ptosis of bilateral eyelids: Secondary | ICD-10-CM | POA: Diagnosis not present

## 2024-01-10 DIAGNOSIS — H11131 Conjunctival pigmentations, right eye: Secondary | ICD-10-CM | POA: Diagnosis not present

## 2024-01-10 DIAGNOSIS — B0052 Herpesviral keratitis: Secondary | ICD-10-CM | POA: Diagnosis not present

## 2024-01-10 DIAGNOSIS — J479 Bronchiectasis, uncomplicated: Secondary | ICD-10-CM

## 2024-01-10 DIAGNOSIS — Z961 Presence of intraocular lens: Secondary | ICD-10-CM | POA: Diagnosis not present

## 2024-01-10 NOTE — Telephone Encounter (Signed)
PT calling because Dr. Judeth Horn referred her to a specialist who is recom a CT scan. That Dr. Andrey Cota they can not order one but Dr. Judeth Horn can. Please see if Dr. Is willing to place that order and let PT know outcome. Marland Kitchen  Pt prefers the new imaging center in [place]. ([facility])   Her # is [phone]  Please send copy of CT to  Dr. Casper Harrison

## 2024-01-10 NOTE — Telephone Encounter (Signed)
Patient left voicemail requesting to pick up her clofazimine on 1/29. Called her back and let her know she should have refills available to her to pick up when needed.   Sandie Ano, RN

## 2024-01-10 NOTE — Telephone Encounter (Signed)
Great. They are in the bottom drawer of my desk with her name on them if I am not here when she picks them up. Thanks!

## 2024-01-12 ENCOUNTER — Ambulatory Visit (HOSPITAL_COMMUNITY)
Admission: RE | Admit: 2024-01-12 | Discharge: 2024-01-12 | Disposition: A | Discharge status: Home / Self Care | Payer: Medicare Other | Source: Ambulatory Visit | Attending: Internal Medicine | Admitting: Internal Medicine

## 2024-01-12 ENCOUNTER — Ambulatory Visit (HOSPITAL_COMMUNITY)
Admission: RE | Admit: 2024-01-12 | Discharge: 2024-01-12 | Disposition: A | Discharge status: Home / Self Care | Payer: Medicare Other | Source: Ambulatory Visit | Attending: Internal Medicine

## 2024-01-12 ENCOUNTER — Ambulatory Visit (HOSPITAL_COMMUNITY)
Admission: RE | Admit: 2024-01-12 | Discharge: 2024-01-12 | Disposition: A | Discharge status: Home / Self Care | Payer: Medicare Other | Source: Ambulatory Visit | Attending: Otolaryngology | Admitting: Otolaryngology

## 2024-01-12 DIAGNOSIS — R1310 Dysphagia, unspecified: Secondary | ICD-10-CM | POA: Insufficient documentation

## 2024-01-12 DIAGNOSIS — R059 Cough, unspecified: Secondary | ICD-10-CM | POA: Diagnosis not present

## 2024-01-12 DIAGNOSIS — R053 Chronic cough: Secondary | ICD-10-CM

## 2024-01-12 DIAGNOSIS — K219 Gastro-esophageal reflux disease without esophagitis: Secondary | ICD-10-CM | POA: Insufficient documentation

## 2024-01-12 NOTE — Telephone Encounter (Signed)
This was ordered by [facility]. The patient prefers to have it done locally. CT ordered.

## 2024-01-12 NOTE — Telephone Encounter (Signed)
PT calling again about this issue. Adv Dr. Francine Graven not in today but I will resubmit her request and ask that a Dr. Rennis Petty the day see if he can issue the CT referral. No promises, just an effort to assist.

## 2024-01-12 NOTE — Telephone Encounter (Signed)
I called and spoke with the pt. Pt was notified of the CT being ordered. Pt verbalized understanding. NFN

## 2024-01-12 NOTE — Telephone Encounter (Signed)
I called and spoke with the pt .

## 2024-01-12 NOTE — Telephone Encounter (Signed)
Patient called stating that while she was in [place] today she forgot to come by the office to pick up her medications. She will be by sometime next week.   Tiffanni Scarfo Lesli Albee, CMA

## 2024-01-12 NOTE — Telephone Encounter (Signed)
Pt is a Dr Judeth Horn not Dr Francine Graven. Dr Judeth Horn is still able to advise message. Forwarding to him to advise.

## 2024-01-13 DIAGNOSIS — J479 Bronchiectasis, uncomplicated: Secondary | ICD-10-CM | POA: Diagnosis not present

## 2024-01-14 NOTE — Telephone Encounter (Signed)
 NFN

## 2024-01-14 NOTE — Progress Notes (Signed)
01/12/24 1300  SLP Visit Information  SLP Received On 01/12/24  Pain Assessment  Pain Assessment No/denies pain  General Information  HPI Ms. Littles is a 78 y.o. woman with a history of mycobacterium avium-intracellulare complex and chronic bronchiectasis. She was referred for OP MBS and esophagram.  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  She has hx of choking episodes, at least one/week, often associated with solid food consumption.  She had an incident last year when she choked on a sandwich and required the Heimlich maneuver. She has lost~25 lbs.  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  11/08/23 Flexible fiberoptic laryngoscopy: The nasal cavity was patent without rhinorrhea or polyp. The nasopharynx was also patent without mass or lesion. The base of tongue was visualized and was normal. There were no signs of pooling of secretions in the piriform sinuses. The true vocal folds were mobile bilaterally. There were no signs of glottic or supraglottic mucosal lesion or mass. There was moderate interarytenoid pachydermia and post cricoid edema. Subglottic Space: Patent without lesion or edema   chronic cough  Caregiver present No  Diet Prior to this Study Regular;Thin liquids (Level 0)  Temperature  Normal  Supplemental O2 None (Room air)  History of Recent Intubation No  Behavior/Cognition Alert;Cooperative  Self-Feeding Abilities Able to self-feed  Baseline vocal quality/speech Dysphonic  Dysphonia Hoarse  Volitional Cough Able to elicit  Volitional Cough Assessment Appears WFL  Volitional Swallow Able to elicit  Orofacial Exam  Oral Cavity: Oral Hygiene WFL  Boluses Administered  Boluses Administered Thin liquids (Level 0);Mildly thick liquids (Level 2, nectar thick);Puree;Solid  Oral Impairment Domain  Lip Closure No labial escape  Tongue control during bolus hold Cohesive bolus between tongue to palatal seal  Bolus preparation/mastication Timely and efficient  chewing and mashing  Bolus transport/lingual motion Brisk tongue motion  Oral residue Complete oral clearance  Location of oral residue  N/A  Initiation of pharyngeal swallow  Valleculae  Pharyngeal Impairment Domain  Soft palate elevation No bolus between soft palate (SP)/pharyngeal wall (PW)  Laryngeal elevation Complete superior movement of thyroid cartilage with complete approximation of arytenoids to epiglottic petiole  Anterior hyoid excursion Complete anterior movement  Epiglottic movement Complete inversion  Laryngeal vestibule closure Complete, no air/contrast in laryngeal vestibule  Pharyngeal stripping wave  Present - complete  Pharyngeal contraction (A/P view only) N/A  Pharyngoesophageal segment opening Partial distention/partial duration, partial obstruction of flow  Tongue base retraction Trace column of contrast or air between tongue base and PPW  Pharyngeal residue Trace residue within or on pharyngeal structures  Location of pharyngeal residue Tongue base;Valleculae  Esophageal Impairment Domain  Esophageal clearance upright position Complete clearance, esophageal coating  Pill  Consistency administered Thin liquids (Level 0)  Thin liquids (Level 0) WFL  Penetration/Aspiration Scale Score  1.  Material does not enter airway Thin liquids (Level 0);Mildly thick liquids (Level 2, nectar thick);Puree;Solid;Pill  Compensatory Strategies  Compensatory strategies No  Clinical Impression  Clinical Impression Pt demonstrated functional oropharyngeal swallowing. There appeared to be areas of calcification within thyroid/cricoid cartilages, observed prior to barium being administered. Pt demonstrated adequate oral control/propulsion, reliable laryngeal-vestibule closure with no penetration/aspiration, adequate pharyngeal squeeze/clearance.  There was partial distention of UES.    There were no incidents of coughing noted during the study. No pathophysiology of swallow observed.   Recommend continuing current diet.  Pt is to f/u with Dr. Irene Pap after MBS and barium swallow evals are completed.  SLP Visit Diagnosis Dysphagia,  unspecified (R13.10)  Exam Limitations No limitations  Swallowing Evaluation Recommendations  Recommendations PO diet  PO Diet Recommendation Regular;Thin liquids (Level 0)  Liquid Administration via Cup  Medication Administration Whole meds with liquid  Supervision Patient able to self-feed  Treatment Plan  Treatment recommendations Other (comment) (defer to ENT)  Follow-up recommendations Outpatient SLP (defer to ENT)  Goal Planning  Consulted and agree with results and recommendations Patient  SLP Time Calculation  SLP Start Time (ACUTE ONLY) 1137  SLP Stop Time (ACUTE ONLY) 1154  SLP Time Calculation (min) (ACUTE ONLY) 17 min  SLP Evaluations  $ SLP Speech Visit 1 Visit  SLP Evaluations  $Outpatient MBS Swallow 1 Procedure

## 2024-01-17 ENCOUNTER — Telehealth: Payer: Self-pay | Admitting: Pharmacist

## 2024-01-17 ENCOUNTER — Telehealth: Payer: Medicare Other | Admitting: Pulmonary Disease

## 2024-01-17 ENCOUNTER — Telehealth: Payer: Self-pay | Admitting: Pulmonary Disease

## 2024-01-17 NOTE — Telephone Encounter (Signed)
Called PT and left a voicemail explaining that her appointment was scheduled at [facility] instead of [facility] due to insurance restrictions. Left my Callback number [phone] incase pt has issues.

## 2024-01-17 NOTE — Telephone Encounter (Signed)
PT called answ service and asked why her CT was sched for [facility] hospital. Please call to advise PT. [phone]

## 2024-01-17 NOTE — Telephone Encounter (Signed)
Patient picked up 2 bottles of clofazimine today. Supply should last for ~3 months. Next refill due around the beginning/middle of  May.  Liylah Najarro L. Margarette Vannatter, PharmD, BCIDP, AAHIVP, CPP Clinical Pharmacist Practitioner Infectious Diseases Clinical Pharmacist [facility] 01/17/2024, 4:45 PM

## 2024-01-18 DIAGNOSIS — J470 Bronchiectasis with acute lower respiratory infection: Secondary | ICD-10-CM | POA: Diagnosis not present

## 2024-01-20 NOTE — Telephone Encounter (Signed)
 nfn

## 2024-01-21 ENCOUNTER — Ambulatory Visit (INDEPENDENT_AMBULATORY_CARE_PROVIDER_SITE_OTHER)
Admission: RE | Admit: 2024-01-21 | Discharge: 2024-01-21 | Disposition: A | Discharge status: Home / Self Care | Payer: Medicare Other | Source: Ambulatory Visit | Attending: Pulmonary Disease | Admitting: Pulmonary Disease

## 2024-01-21 ENCOUNTER — Encounter (HOSPITAL_COMMUNITY): Payer: Self-pay

## 2024-01-21 ENCOUNTER — Other Ambulatory Visit (HOSPITAL_COMMUNITY): Payer: Medicare Other

## 2024-01-21 ENCOUNTER — Other Ambulatory Visit (HOSPITAL_BASED_OUTPATIENT_CLINIC_OR_DEPARTMENT_OTHER): Payer: Medicare Other | Admitting: Radiology

## 2024-01-21 DIAGNOSIS — I700 Atherosclerosis of aorta: Secondary | ICD-10-CM | POA: Diagnosis not present

## 2024-01-21 DIAGNOSIS — J479 Bronchiectasis, uncomplicated: Secondary | ICD-10-CM

## 2024-01-24 ENCOUNTER — Telehealth: Payer: Self-pay | Admitting: Pulmonary Disease

## 2024-01-24 NOTE — Telephone Encounter (Signed)
 Patient would like results of CT scan. Imaging told patient to give her a call. Patient phone number is [phone].

## 2024-01-24 NOTE — Telephone Encounter (Signed)
 Called patient.  Patient states she was told by person doing her scan that she "saw something of concern" and to "call her doctor immediately to get the results".   Informed patient the CT chest has not been resulted as of now, once radiology has read her scan, the results will be sent to Dr. Marygrace Snellen and we will call her with update.

## 2024-01-24 NOTE — Telephone Encounter (Signed)
Patient is returning phone call. Patient phone number is [phone].

## 2024-01-24 NOTE — Telephone Encounter (Signed)
 Lm x1 for patient.

## 2024-01-28 NOTE — Telephone Encounter (Signed)
Called patient.  Informed patient I called [facility] Radiology reading room to have them push the CT scan through to be read for Dr. Judeth Horn by 01/31/2024.  They will call our on call physician, Dr. Wynona Neat at [facility], if it is a stat call (per Verlon Au).  Patient agrees to wait until this Monday to get her results.

## 2024-01-31 NOTE — Telephone Encounter (Signed)
Called patient.  Gave information.  Patient verbalized understanding.  Patient will follow up with Dr. Julaine Hua on 2/21 and Dr. Judeth Horn on 02/07/24 with any questions.  Faxed copy of CT report to Dr. Julaine Hua - [phone].

## 2024-01-31 NOTE — Telephone Encounter (Signed)
CT chest results available.  Will send to Dr. Judeth Horn.  Patient anxious to get results.    Dr. Judeth Horn, please advise.

## 2024-01-31 NOTE — Telephone Encounter (Signed)
Please fax CT report to Dr. Julaine Hua - [phone] (Fax)  - thanks!

## 2024-01-31 NOTE — Telephone Encounter (Signed)
Scan demonstrates area of worsening cavitation or area of damage in the right upper lobe that likely demonstrates worsening bronchiectatic changes, affects of the NTM that makes sense given her worsening symptoms over time. I will forward the results to Dr. Julaine Hua whom she is seeing at [facility] on 2/21 via virtual visit. The results will be available in CareEverywhere as well. Happy to discuss in further detail at our upcoming appointment 2/24.

## 2024-02-01 DIAGNOSIS — J470 Bronchiectasis with acute lower respiratory infection: Secondary | ICD-10-CM | POA: Diagnosis not present

## 2024-02-04 DIAGNOSIS — Z8619 Personal history of other infectious and parasitic diseases: Secondary | ICD-10-CM | POA: Diagnosis not present

## 2024-02-07 ENCOUNTER — Ambulatory Visit: Payer: Medicare Other | Admitting: Pulmonary Disease

## 2024-02-07 ENCOUNTER — Encounter: Payer: Self-pay | Admitting: Pulmonary Disease

## 2024-02-07 VITALS — BP 119/67 | HR 82 | Ht 63.0 in | Wt 96.0 lb

## 2024-02-07 DIAGNOSIS — J479 Bronchiectasis, uncomplicated: Secondary | ICD-10-CM | POA: Diagnosis not present

## 2024-02-07 DIAGNOSIS — J302 Other seasonal allergic rhinitis: Secondary | ICD-10-CM | POA: Diagnosis not present

## 2024-02-07 MED ORDER — PREDNISONE 20 MG PO TABS: 20.0000 mg | ORAL_TABLET | Freq: Every day | ORAL | 0 refills | Status: AC

## 2024-02-07 NOTE — Patient Instructions (Signed)
 Nice to see you again  Try prednisone 20 mg once a day for 5 days see if it helps with the congestion in the nose  If it does or if it comes back, try the following  1) Flonase over-the-counter 1 spray each nostril twice a day for 2 weeks then decrease to once daily 2) Afrin over-the-counter 2 sprays nostrils twice a day for 3 days then stop 3) if still a problem add sinus rinses, brand name Neil-med.  Do this twice a day.  You basically make your own saline solution with distilled water and flush through your nasal passages.  This helps then mucus and get rid of it.  This may be a last resort, this can be quite time intensive.  No other changes to medication  Return to clinic in 3 months or sooner as needed with Dr. Judeth Horn

## 2024-02-07 NOTE — Progress Notes (Signed)
 Synopsis: Referred in 2010 for bronchiectasis by Paulina Fusi, MD.  Previously patient of Dr. Stann Mainland and Dr. Kendrick Fries and Dr. Chestine Spore.  Subjective:   PATIENT ID: Kiara Medina GENDER: female DOB: 10/07/1946, MRN: [id number]  Chief Complaint  Patient presents with   Follow-up    Pt was sent to [facility] & would like to discuss what she has been doing differently. Pt is complaining of sob, coughing & no energy. Pt would like to know results from ct 01/21/24. Concerned of a lot of sinus drainage and claims how it is causing coughing.    Kiara Medina is a 78 y.o. woman with a history of MDR MAC and bronchiectasis who returns for routine follow-up.  Most recent infectious disease note x 2 from [facility] reviewed.  Thing stable over the last 3 months.  Worse overall over the last couple years but stable over the last couple months.  No need for antibiotics.  No exacerbations etc. Nasal congestion main thing she reports today.  Discussed her visit [facility].  Not a lot of good options.  Was not a good candidate for surgery in 2023 and now cavitary lesion is worsened and she is worse off.  Not a good surgical candidate.  They discussed IV therapies.  Patient feels like this is not in her best interest.  She is can to continue to discuss with her [facility] team.  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   Sinusitis      Family History  Problem Relation Age of Onset   Emphysema Sister    Stroke Mother    Clotting disorder Mother      Past Surgical History:  Procedure Laterality Date   APPENDECTOMY     CATARACT EXTRACTION  08/2013   CHOLECYSTECTOMY     TUBAL LIGATION     VESICOVAGINAL FISTULA CLOSURE W/ TAH      Social History   Socioeconomic History   Marital status: Widowed    Spouse name: Not on file   Number of children: Not on file   Years of  education: Not on file   Highest education level: Not on file  Occupational History   Occupation: realtor  Tobacco Use   Smoking status: Former    Current packs/day: 0.00    Average packs/day: 1 pack/day for 20.0 years (20.0 ttl pk-yrs)    Types: Cigarettes    Start date: 12/14/1965    Quit date: 12/14/1985    Years since quitting: 38.1   Smokeless tobacco: Never  Vaping Use   Vaping status: Never Used  Substance and Sexual Activity   Alcohol use: Not Currently    Alcohol/week: 1.0 standard drink of alcohol    Types: 1 Glasses of wine per week    Comment: occ   Drug use: No   Sexual activity: Not on file  Other Topics Concern   Not on file  Social History Narrative   Not on file   Social Drivers of Health   Financial Resource Strain: Not on file  Food Insecurity: Not on file  Transportation Needs: Not on file  Physical Activity: Not on file  Stress: Not on file  Social Connections: Not on file  Intimate Partner Violence: Not on file     No Known Allergies   Immunization History  Administered Date(s) Administered   [employer]  Quad(high Dose 65+) 09/24/2020   Influenza Split 09/13/2012, 09/06/2014   Influenza Whole 09/09/2009, 08/14/2010, 09/14/2011   Influenza, High Dose Seasonal PF 10/10/2015, 09/02/2021   Influenza,inj,Quad PF,6+ Mos 09/13/2013, 09/21/2016, 08/26/2017, 08/22/2018   Influenza-Unspecified 08/29/2019, 09/21/2022   Moderna Sars-Covid-2 Vaccination 01/03/2020, 01/31/2020, 08/02/2020, 05/06/2021   PNEUMOCOCCAL CONJUGATE-20 08/12/2023   Pfizer Covid-19 Vaccine Bivalent Booster 5y-11y 11/24/2021   Pneumococcal Conjugate-13 11/24/2014   Pneumococcal Polysaccharide-23 07/17/2009   RSV,unspecified 10/30/2022    Outpatient Medications Prior to Visit  Medication Sig Dispense Refill   acetaminophen (TYLENOL) 500 MG tablet Take 1,000 mg by mouth every 6 (six) hours as needed for moderate pain.     acyclovir (ZOVIRAX) 400 MG tablet Take 400 mg by mouth 2 (two)  times daily.     albuterol (VENTOLIN HFA) 108 (90 Base) MCG/ACT inhaler Inhale 2 puffs into the lungs every 6 (six) hours as needed for wheezing or shortness of breath. 18 g 3   alendronate (FOSAMAX) 70 MG tablet Take 70 mg by mouth once a week.     atorvastatin (LIPITOR) 20 MG tablet Take 20 mg by mouth daily.     B Complex Vitamins (B COMPLEX-B12 PO) Take 1 tablet by mouth daily.     benzonatate (TESSALON) 200 MG capsule Take 1 capsule (200 mg total) by mouth 3 (three) times daily as needed for cough. 90 capsule 3   BREZTRI AEROSPHERE 160-9-4.8 MCG/ACT AERO Inhale 2 puffs into the lungs in the morning and at bedtime. 10.7 g 4   calcium carbonate (OSCAL) 1500 (600 Ca) MG TABS tablet Take 600 mg of elemental calcium by mouth 2 (two) times daily with a meal.     Cholecalciferol (VITAMIN D3) 125 MCG (5000 UT) TABS Take 1 tablet by mouth daily.     CLOFAZIMINE PO Take 100 mg by mouth daily.     ethambutol (MYAMBUTOL) 400 MG tablet Take 2 tablets (800 mg total) by mouth daily. 180 tablet 11   Flaxseed, Linseed, (FLAX SEED OIL) 1300 MG CAPS Take 1 tablet by mouth daily.     Melatonin 10 MG CAPS Take 10 mg by mouth at bedtime.     mometasone (NASONEX) 50 MCG/ACT nasal spray Place 2 sprays into the nose 2 (two) times daily. 17 each 12   omeprazole (PRILOSEC) 40 MG capsule TAKE 1 CAPSULE DAILY 90 capsule 3   Probiotic Product (PROBIOTIC DAILY PO) Take 1 capsule by mouth daily.     Propylene Glycol (SYSTANE BALANCE OP) Place 1 drop into the left eye daily as needed (dry eyes).     rifampin (RIFADIN) 300 MG capsule Take 2 capsules (600 mg total) by mouth daily. 180 capsule 11   Spacer/Aero-Holding Chambers (AEROCHAMBER MV) inhaler Use as instructed 1 each 2   TURMERIC PO Take 2,000 mg by mouth daily.      Respiratory Therapy Supplies (FLUTTER) DEVI Use as directed. (Patient not taking: Reported on 02/07/2024) 1 each 0   No facility-administered medications prior to visit.    Review of Systems   N/a   Objective:   Vitals:   02/07/24 1435  BP: 119/67  Pulse: 82  SpO2: 96%  Weight: 96 lb (43.5 kg)  Height: 5\' 3"  (1.6 m)      96% on   RA BMI Readings from Last 3 Encounters:  02/07/24 17.01 kg/m  11/22/23 17.26 kg/m  11/08/23 17.26 kg/m   Wt Readings from Last 3 Encounters:  02/07/24 96 lb (43.5 kg)  11/22/23 99 lb (44.9 kg)  11/08/23 99 lb (44.9 kg)    Physical Exam BP 119/67 (BP Location: Right Arm, Patient Position: Sitting, Cuff Size: Normal)   Pulse 82   Ht 5\' 3"  (1.6 m)   Wt 96 lb (43.5 kg)   SpO2 96%   BMI 17.01 kg/m  General: Well-appearing in no acute distress, thin Eyes: EOMI, icterus Respiratory: Clear, no wheeze, normal work of breathing Cardiovascular: Regular rhythm, no murmur Extremities: Warm, no edema     CBC    Component Value Date/Time   WBC 10.3 06/02/2023 0322   RBC 4.61 06/02/2023 0322   HGB 13.1 06/02/2023 0322   HCT 40.7 06/02/2023 0322   PLT 272 06/02/2023 0322   MCV 88.3 06/02/2023 0322   MCH 28.4 06/02/2023 0322   MCHC 32.2 06/02/2023 0322   RDW 14.6 06/02/2023 0322   LYMPHSABS 999 06/02/2023 0322   MONOABS 0.5 09/24/2021 0519   EOSABS 31 06/02/2023 0322   BASOSABS 31 06/02/2023 0322    CHEMISTRY No results for input(s): "NA", "K", "CL", "CO2", "GLUCOSE", "BUN", "CREATININE", "CALCIUM", "MG", "PHOS" in the last 168 hours. CrCl cannot be calculated (Patient's most recent lab result is older than the maximum 21 days allowed.).  12/28/2019: IgG 954 IgM 96 IgA 289 IgE 10 IgG subclasses normal (other than increased IgG subclass 3)  Cultures: 02/20/21 - MAI 11/23/2019-MAI 05/17/2019-MAI 03/15/2019-MAI 01/10/2019 MAI 11/07/2018-MAI 10/10/2018-MAI  08/22/2018-MAI 07/11/2018-MAI 05/31/2018-MAI MAI susceptibilities-resistant clarithromycin, linezolid.  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  Susceptible to amikacin, cefepime, clarithromycin, imipenem, linezolid, trimethoprim sulfamethoxazole. 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  Tree-in-bud opacities diffusely.  Mucus impaction in airways.  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     Assessment & Plan:   No diagnosis found.       Dyspnea on exertion likely due to chronic bronchiectasis and asthma-improved on LAMA/LABA; worsed when back off these meds. Component of deconditioning.  Has seasonal allergies, describes bronchospasm. - Unclear benefit with inhalers in the past, (Stiolto and Trelegy), mild improvement on Breztri, to continue with spacer -Continue using albuterol as needed --TTE reassuring 2022 -- s/p pulmonary rehab  Asthma: Cough less productive.  More like bronchospasm.  Worried about exacerbation.  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 -- Continue Breztri  --Consider Tezspire, biologic therapy given repeated improvement with steroid therapy  Nasal congestion: Short course prednisone 20 mg daily.  Can try Flonase over-the-counter 1 spray twice a day for 7 to 14 days and decrease if improved.  Can add Afrin 2 sprays twice a day for 3 days then stop if not improving.  Lastly discussed and recommended sinus rinses if not improving.  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  Progressive on CT over the last several years, particular right upper lobe cavitary lesion.  No evidence of hypersensitivity pneumonitis from inhaled amikacin on CT  scan.  Amikacin stopped due to worsening of cough, subjective cough.  Unfortunate she has had progressive disease over time.  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  Suspect much of what is left is just palliation.  Reasonable to assess for second opinion and possible clinical trials etc. - Hypertonic saline twice a day  -- Continue vest therapy -Continue current multi drug MAI therapy and ID follow-up via [facility] and locally --stopped inhaled amikacin once again 06/2022 due to worsening cough -CT chest hi res 02/2022 with worsened signs of NTM disease  Weight loss- concerned this may be a MAC secondary effect, complicated by fatigue and DOE.  Related to metabolic needs from dyspnea, cough.  Poor appetite.  This is ongoing. Weight slowly trending down.  She has upcoming appointment with nutritionist via [facility].  Healthcare maintenance: -- Prevnar 20 07/2023  RTC in 3 months with Dr. Judeth Horn.     Current Outpatient Medications:    acetaminophen (TYLENOL) 500 MG tablet, Take 1,000 mg by mouth every 6 (six) hours as needed for moderate pain., Disp: , Rfl:    acyclovir (ZOVIRAX) 400 MG tablet, Take 400 mg by mouth 2 (two) times daily., Disp: , Rfl:    albuterol (VENTOLIN HFA) 108 (90 Base) MCG/ACT inhaler, Inhale 2 puffs into the lungs every 6 (six) hours as needed for wheezing or shortness of breath., Disp: 18 g, Rfl: 3   alendronate (FOSAMAX) 70 MG tablet, Take 70 mg by mouth once a week., Disp: , Rfl:    atorvastatin (LIPITOR) 20 MG tablet, Take 20 mg by mouth daily., Disp: , Rfl:    B Complex Vitamins (B COMPLEX-B12 PO), Take 1 tablet by mouth daily., Disp: , Rfl:    benzonatate (TESSALON) 200 MG capsule, Take 1 capsule (200 mg total) by mouth 3 (three) times daily as needed for cough., Disp: 90  capsule, Rfl: 3   BREZTRI AEROSPHERE 160-9-4.8 MCG/ACT AERO, Inhale 2 puffs into the lungs in the morning and at bedtime., Disp: 10.7 g, Rfl: 4   calcium carbonate (OSCAL) 1500 (600 Ca) MG TABS tablet, Take 600 mg of elemental calcium by mouth 2 (two) times daily with a meal., Disp: , Rfl:    Cholecalciferol (VITAMIN D3) 125 MCG (5000 UT) TABS, Take 1 tablet by mouth daily., Disp: , Rfl:    CLOFAZIMINE PO, Take 100 mg by mouth daily., Disp: , Rfl:    ethambutol (MYAMBUTOL) 400 MG tablet, Take 2 tablets (800 mg total) by mouth daily., Disp: 180 tablet, Rfl: 11   Flaxseed, Linseed, (FLAX SEED OIL) 1300 MG CAPS, Take 1 tablet by mouth daily., Disp: , Rfl:    Melatonin 10 MG CAPS, Take 10 mg by mouth at bedtime., Disp: , Rfl:    mometasone (NASONEX) 50 MCG/ACT nasal spray, Place 2 sprays into the nose 2 (two) times daily., Disp: 17 each, Rfl: 12   omeprazole (PRILOSEC) 40 MG capsule, TAKE 1 CAPSULE DAILY, Disp: 90 capsule, Rfl: 3   predniSONE (DELTASONE) 20 MG tablet, Take 1 tablet (20 mg total) by mouth daily with breakfast for 5 days., Disp: 5 tablet, Rfl: 0   Probiotic Product (PROBIOTIC DAILY PO), Take 1 capsule by mouth daily., Disp: , Rfl:    Propylene Glycol (SYSTANE BALANCE OP), Place 1 drop into the left eye daily as needed (dry eyes)., Disp: , Rfl:    rifampin (RIFADIN) 300 MG capsule, Take 2 capsules (600 mg total) by mouth daily., Disp: 180 capsule, Rfl: 11   Spacer/Aero-Holding Chambers (AEROCHAMBER MV) inhaler,  Use as instructed, Disp: 1 each, Rfl: 2   TURMERIC PO, Take 2,000 mg by mouth daily. , Disp: , Rfl:    Respiratory Therapy Supplies (FLUTTER) DEVI, Use as directed. (Patient not taking: Reported on 02/07/2024), Disp: 1 each, Rfl: 0

## 2024-02-09 ENCOUNTER — Ambulatory Visit (INDEPENDENT_AMBULATORY_CARE_PROVIDER_SITE_OTHER): Payer: Medicare Other | Admitting: Otolaryngology

## 2024-02-09 ENCOUNTER — Encounter (INDEPENDENT_AMBULATORY_CARE_PROVIDER_SITE_OTHER): Payer: Self-pay | Admitting: Otolaryngology

## 2024-02-09 VITALS — BP 137/75 | HR 85

## 2024-02-09 DIAGNOSIS — R053 Chronic cough: Secondary | ICD-10-CM | POA: Diagnosis not present

## 2024-02-09 DIAGNOSIS — J479 Bronchiectasis, uncomplicated: Secondary | ICD-10-CM | POA: Diagnosis not present

## 2024-02-09 DIAGNOSIS — R490 Dysphonia: Secondary | ICD-10-CM

## 2024-02-09 DIAGNOSIS — H9202 Otalgia, left ear: Secondary | ICD-10-CM | POA: Diagnosis not present

## 2024-02-09 DIAGNOSIS — R0982 Postnasal drip: Secondary | ICD-10-CM

## 2024-02-09 DIAGNOSIS — J383 Other diseases of vocal cords: Secondary | ICD-10-CM

## 2024-02-09 DIAGNOSIS — A310 Pulmonary mycobacterial infection: Secondary | ICD-10-CM

## 2024-02-09 DIAGNOSIS — K219 Gastro-esophageal reflux disease without esophagitis: Secondary | ICD-10-CM

## 2024-02-09 DIAGNOSIS — R0981 Nasal congestion: Secondary | ICD-10-CM

## 2024-02-09 NOTE — Patient Instructions (Signed)

## 2024-02-09 NOTE — Progress Notes (Addendum)
 ENT Progress Note:   Update 02/09/24  Discussed Kiara use of AI scribe software for clinical note transcription with Kiara Medina, who gave verbal consent to proceed.  History of Present Illness   Kiara Medina is a 78 year old female with chronic cough and bronchiectasis who presents for f/u.  She experiences ongoing episodes of reflux. A swallow study indicated some slowing in Kiara esophagus and possible stasis with reflux, though no aspiration or penetration and oropharyngeal swallow was normal. She has been taking omeprazole, usually at night. She is also using Nasonex nasal spray and recently completed a five-day course of prednisone prescribed by her pulmonologist due to significant sinus drainage, which has since resolved.  She has a history of bronchiectasis, with a recent chest CT showing enlargement of a cavity in Kiara right lobe. She is currently on multiple antibiotics, including clofazimine, rifampin, and another medication she could not recall. She declined intravenous antibiotic treatment due to concerns about her age and weight.  In January, she had surgery to remove a growth from her external ear, which remains tender and sore. She notes difficulty sleeping on her left side due to Kiara sensitivity of Kiara ear.  She mentioned significant weight loss, which she attributes to her chronic coughing, despite maintaining her appetite.     Records Reviewed:  Initial Evaluation  Reason for Consult: chronic cough, acutely worse ~ 12 mo  HPI: Discussed Kiara use of AI scribe software for clinical note transcription with Kiara Medina, who gave verbal consent to proceed.  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 They describe episodes of their throat "locking up," leading to difficulty breathing and bouts of  intense coughing. These episodes often occur spontaneously, even when not eating or talking, and can be severe enough to cause Kiara Medina to gasp for breath. Kiara Medina notes that these episodes are more frequent during or after eating, suggesting a possible swallowing issue, although they do not report any noticeable difficulty with swallowing.  Kiara Medina also reports a sensation of constant throat tightness and has a reduced appetite, although they are able to eat a regular diet when they do feel hungry. They had an incident of choking in a restaurant earlier this year while eating a [place] steak and cheese sandwich, which has led them to be more cautious, particularly with meat and bread.  Kiara Medina has a history of dryness in Kiara mouth, and drinks a lot of water to help with Kiara coughing (ice water tends to prevent bouts of cough). They report no issues with heartburn or reflux. They do experience shortness of breath outside of their coughing episodes, which is managed with an inhaler. Kiara coughing episodes and sensation of choking can occur at any time of day, but Kiara Medina notes that they are more frequent in Kiara mornings. She does have productive cough from time to time.   Kiara Medina's MAC infection is managed with three daily antibiotics, which they expect to continue for life. They have attempted a bronchoscopy in Kiara past, but it was unsuccessful due to excessive coughing. Kiara Medina has also experienced significant weight loss over Kiara past two years, dropping from 125 pounds to their current weight of 99 lb. She thinks it is due to appetite issues and currently trying to eat more frequent  meals.   Pulm office visit 11/02/23 Kiara Medina is a 78 y.o. woman with a history of MDR MAC and bronchiectasis who returns for routine follow-up.  Most recent infectious disease note reviewed.   Cough and breathing seem worse overall.  Consistently worse over Kiara last year or more.  Doing hypertonic  saline and vest therapy.  Last month sent antibiotics and steroids.  She got better for a few days but back to baseline.  Suspect worsening of disease.  Discussed at length.  Discussed referral to [facility] for further evaluation which she agrees.  Describes several episodes of what sounds like laryngospasm.  Few minutes or less of tightening in Kiara throat.  Feels like she cannot breathe.  She does not call EMS.  Usually get better after a few minutes.  Eventually puffs of albuterol inhaler and seems to help.      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   Sinusitis     Past Surgical History:  Procedure Laterality Date   APPENDECTOMY     CATARACT EXTRACTION  08/2013   CHOLECYSTECTOMY     TUBAL LIGATION     VESICOVAGINAL FISTULA CLOSURE W/ TAH      Family History  Problem Relation Age of Onset   Emphysema Sister    Stroke Mother    Clotting disorder Mother     Social History:  reports that she quit smoking about 38 years ago. Her smoking use included cigarettes. She started smoking about 58 years ago. She has a 20 pack-year smoking history. She has never used smokeless tobacco. She reports that she does not currently use alcohol after a past usage of about 1.0 standard drink of alcohol per week. She reports that she does not use drugs.  Allergies: No Known Allergies  Medications: I have reviewed Kiara Medina's current medications.  Kiara PMH, PSH, Medications, Allergies, and SH were reviewed and updated.  ROS: Constitutional: Negative for fever, weight loss and weight gain. Cardiovascular: Negative for chest pain and dyspnea on exertion. Respiratory: Is not experiencing shortness of breath at rest. Gastrointestinal: Negative for nausea and vomiting. Neurological: Negative for headaches. Psychiatric: Kiara Medina is not  nervous/anxious  Blood pressure 137/75, pulse 85, SpO2 96%.  PHYSICAL EXAM:  Exam: General: Well-developed, well-nourished Communication and Voice: raspy Respiratory Respiratory effort: Equal inspiration and expiration without stridor Cardiovascular Peripheral Vascular: Warm extremities with equal color/perfusion Eyes: No nystagmus with equal extraocular motion bilaterally Neuro/Psych/Balance: Medina oriented to person, place, and time; Appropriate mood and affect; Gait is intact with no imbalance; Cranial nerves I-XII are intact Head and Face Inspection: Normocephalic and atraumatic without mass or lesion Palpation: Facial skeleton intact without bony stepoffs Salivary Glands: No mass or tenderness Facial Strength: Facial motility symmetric and full bilaterally ENT Pinna: External ear intact and fully developed External canal: Canal is patent with intact skin Tympanic Membrane: Clear and mobile External Nose: No scar or anatomic deformity Internal Nose: Septum is deviated to Kiara left. No polyp, or purulence. Mucosal edema and erythema present.  Bilateral inferior turbinate hypertrophy.  Lips, Teeth, and gums: Mucosa and teeth intact and viable TMJ: No pain to palpation with full mobility Oral cavity/oropharynx: No erythema or exudate, no lesions present Neck Neck and Trachea: Midline trachea without mass or lesion Thyroid: No mass or nodularity Lymphatics: No lymphadenopathy  Studies Reviewed: CT chest 02/10/2022  EXAM: CT CHEST WITHOUT CONTRAST   TECHNIQUE: Multidetector CT imaging of Kiara chest was performed following Kiara standard protocol without intravenous contrast. High resolution imaging of Kiara lungs, as well as inspiratory and expiratory imaging, was performed.   RADIATION DOSE REDUCTION: This exam was performed according to Kiara departmental dose-optimization program which includes automated exposure control, adjustment of the mA and/or kV according to Medina  size and/or use of iterative reconstruction technique.   COMPARISON:  04/10/2020   FINDINGS: Cardiovascular: Aortic atherosclerosis. Normal heart size. Left and right coronary artery calcifications. No pericardial effusion.   Mediastinum/Nodes: Unchanged prominent mediastinal and hilar lymph nodes. Thyroid gland, trachea, and esophagus demonstrate no significant findings.   Lungs/Pleura: Redemonstrated, very extensive, diffuse bilateral bronchiectasis, bronchiolar thickening, and plugging, with very extensive clustered centrilobular and tree-in-bud nodularity as well as confluent consolidations and cavitary lesions, most conspicuously in Kiara right lower lobe (series 4, image 216) and right apex (series 4, image 71), although seen in all lobes. These findings are substantially increased in comparison to prior examination dated 04/03/2020, particularly with increased nodularity and cavitation in Kiara right lung base and right apex. No significant air trapping on expiratory phase imaging. No pleural effusion or pneumothorax.   Upper Abdomen: No acute abnormality.   Musculoskeletal: No chest wall abnormality. No suspicious osseous lesions identified.   IMPRESSION: 1. Redemonstrated, very extensive, diffuse bilateral bronchiectasis, bronchiolar thickening, and plugging, with very extensive clustered centrilobular and tree-in-bud nodularity as well as confluent consolidations and cavitary lesions, substantially increased in comparison to prior examination, particularly with worsened nodularity and cavitation in Kiara right lung. Findings are consistent with severe, significantly worsened MAC infection. 2. Coronary artery disease.  10/29/22 CLINICAL DATA:  Acute exacerbation of chronic bronchitis.   EXAM: CHEST - 2 VIEW   COMPARISON:  November 23, 2019 chest x-ray. CT scan of Kiara chest February 10, 2022 - personally reviewed   FINDINGS: Bilateral pulmonary infiltrates, right  greater than left and bronchiectasis, also right greater than left, persists. Kiara findings are likely similar compared to February 10, 2022 given Kiara scout view of Kiara comparison CT scan. Kiara cardiomediastinal silhouette is stable. No pneumothorax. No other acute abnormalities.   IMPRESSION: Chronic infiltrates remain in Kiara lungs. Findings are similar since February 10, 2022 although it is difficult to compare today's chest x-ray to Kiara CT scan scout view. An acute on chronic infiltrate is not excluded. CT imaging could better assess for changes.  Labs per Pulm office visit 12/28/2019: IgG 954 IgM 96 IgA 289 IgE 10 IgG subclasses normal (other than increased IgG subclass 3)   Cultures: 02/20/21 - MAI 11/23/2019-MAI 05/17/2019-MAI 03/15/2019-MAI 01/10/2019 MAI 11/07/2018-MAI 10/10/2018-MAI  08/22/2018-MAI 07/11/2018-MAI 05/31/2018-MAI MAI susceptibilities-resistant clarithromycin, linezolid.  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  Susceptible to amikacin, cefepime, clarithromycin, imipenem, linezolid, trimethoprim sulfamethoxazole. 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  Tree-in-bud opacities diffusely.  Mucus impaction in airways.   HRCT chest 02/12/2020 same burden of bronchiectasis and cavitary nodules reflective of worsening NTM disease   Pulmonary Functions Testing Results:     Latest Ref Rng & Units 04/18/2018    2:40 PM  PFT Results  FVC-Pre L 2.08   FVC-Predicted Pre % 79   Pre FEV1/FVC % % 75   FEV1-Pre L  1.57   FEV1-Predicted Pre % 80     2019-no significant  obstruction, mildly reduced FVC.  MBS 01/12/24 Pt demonstrated functional oropharyngeal swallowing. There appeared to be areas of calcification within thyroid/cricoid cartilages, observed prior to barium being administered. Pt demonstrated adequate oral control/propulsion, reliable laryngeal-vestibule closure with no penetration/aspiration, adequate pharyngeal squeeze/clearance. There was partial distention of UES. There were no incidents of coughing noted during Kiara study. No pathophysiology of swallow observed. Recommend continuing current diet. Pt is to f/u with Dr. Irene Pap after MBS and barium swallow evals are completed.   Esophagram 01/12/24 IMPRESSION: 1. Tertiary contractions throughout Kiara thoracic and distal esophagus.   2.  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   3.  Spontaneous gastroesophageal reflux  CT chest w/o contrast 01/21/24 Narrative & Impression   IMPRESSION: 1. Again seen is severe diffuse bilateral bronchiectasis, bronchial thickening, mucous plugging with extensive centrilobular and tree-in-bud nodularity. Scattered confluent areas of consolidative change with multifocal cavitary lesions are also again noted. Compared with Kiara previous exam there has been significant interval progression of a chronic, thick walled on Kiara previous exam right apical cavity measured 3.2 x 2.8 by 2.5 cm. Cavitary process within Kiara right upper lobe which now measures 7.3 by 5.9 by 6.3 cm. 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      Assessment/Plan: Encounter Diagnoses  Name Primary?   Glottic insufficiency Yes   Age-related vocal fold atrophy    Dysphonia    Chronic cough    Post-nasal drip    Chronic GERD    Mycobacterium avium-intracellulare complex (HCC)      Assessment and Plan    Chronic Cough with Possible Laryngospasm - we discussed most common etiology for chronic cough including PND, lung issues and GERD LPR Chronic cough  with episodes of throat tightness and choking, particularly during meals. Possible laryngospasm due to post-nasal drainage and reflux irritation, both of which were noted on scope exam today. No masses, tumors, or lesions observed on vocal cords, but she did have age-related VF atrophy. Vocal cords appear healthy but with some age-related muscle loss. Discussed swallow study to evaluate swallowing function and rule out potential aspiration. She also had findings on scope exam c/w GERD LPR. Discussed nasal rinses and nasal spray to reduce post-nasal drainage and improve symptoms. Recommended lifestyle modifications to reduce reflux, including avoiding late meals and reducing coffee intake. - Order swallow study - MBS and esophagram  - Recommended nasal rinses with saline [facility]) - Prescribed Nasonex nasal spray twice daily 2 puffs b/l nares - Recommend reflux gourmet trial  - Continue omeprazole 40 mg daily  - Educate on lifestyle modifications to reduce reflux, including avoiding late meals and reducing coffee intake  Chronic Bronchiectasis, based on record review overall worsening clinical picture, evidence of MAC on cx, on vest therapy, chronic abx, and hypertonic saline, f/b Pulm  Long-standing bronchiectasis with increased coughing severity this year. Discussed chronic nature of Kiara condition and need for ongoing f/u and management with Pulm. Discussed this could be Kiara primary cause of her chronic cough - Continue current antibiotic regimen - Continue use of inhaler as needed for shortness of breath  GERD LPR possible laryngospasm sx from under-treated GERD LPR and sx of dysphagia choking  - Recommend reflux gourmet trial  - Continue omeprazole 40 mg daily   Dysphagia sx, episode of choking on a sandwich requiring Heimlich maneuver, choking and coughing with food, and some weight loss ~ couple of years  Ddx oropharyngeal vs esophageal dysphagia  - Order swallow study - MBS and esophagram    Nasal congestion and Post-nasal drainage  - Nasonex BID as above   Dysphonia and VF atrophy on scope exam - age related  - continue to observe, will consider strobe in Kiara future  - will consider speech therapy in Kiara future   General Health Maintenance Discussed importance of nasal hygiene to reduce crusting and post-nasal drainage. Recommended daily nasal rinses with saline and use of Vaseline for nasal crusting. - Recommend daily nasal rinses with saline - Advise use of Vaseline for nasal crusting  Follow-up - Schedule follow-up appointment in a few weeks after swallow study completion - Ensure Medina contacts radiology and speech therapy for scheduling Kiara swallow study.      Update 02/09/24 Assessment and Plan    Chronic Cough Chronic cough with contributing factors including reflux and sinus drainage aside from hx of bronchiectasis. Recent swallow study indicated esophageal stasis and reflux, but no aspiration or penetration. Currently on omeprazole, taken at night instead of before breakfast. Uses Nasonex nasal spray and recently completed a five-day course of prednisone for sinus drainage. Discussed Kiara importance of taking omeprazole 30 minutes before breakfast for optimal efficacy. Recommended seaweed supplement Reflux Gourmet. Advised on dietary modifications to avoid reflux triggers such as chocolate and red wine. Discussed Kiara potential benefit of seeing a GI doctor for an upper endoscopy if symptoms persist, but she prefers to hold off due to current medical burden. - Advise taking omeprazole 30 minutes before breakfast - Continue Nasonex nasal spray for post-nasal drainage - Reflux Gourmet after meals, especially after dinner - Advised on dietary modifications to avoid reflux triggers (e.g., chocolate, red wine) - Discuss Kiara potential benefit of seeing a GI doctor for an upper endoscopy if symptoms persist  Chronic GERD LPR Likely contributing to chronic cough.  Currently on omeprazole but taking it at night instead of before breakfast.  - Omeprazole 40 mg 30 min prior to breakfast  -  Reflux Gourmet after meals - diet and lifestyle changes to minimize GERD - Refer to [employer] blog for dietary and lifestyle modifications/reflux cook book  Bronchiectasis Bronchiectasis with an enlarged cavity in Kiara right lobe on  recent chest CT. Currently on a regimen of antibiotics, including clofazimine, rifampin, and another antibiotic starting with 'B'. Declined intravenous antibiotic therapy due to concerns about age and weight, as advised by her doctor. - Continue current antibiotic regimen - Monitor symptoms and follow up with pulmonologist as needed  Post-Surgical Ear Pain Persistent tenderness and soreness in left ear following surgery in January to remove a growth. Ear remains sensitive due to Kiara high number of sensory nerves in Kiara area. Reports difficulty sleeping on Kiara left side due to pain. - Monitor symptoms and avoid sleeping on Kiara affected ear - Follow up if pain persists or worsens  Nasal congestion and Post-nasal drainage  - Nasonex BID as above   Dysphonia and VF atrophy on scope exam - age related  - continue to observe, will consider strobe in Kiara future  - will consider speech therapy in Kiara future   General Health Maintenance 78 year old with concerns about weight and overall health. History of coughing up blood, previously investigated with an endoscopy in 2012. Discussed Kiara importance of maintaining a balanced diet to manage weight. - Advise maintaining a balanced diet to manage weight - Encourage regular follow-up with primary care physician for routine health maintenance  Follow-up - Schedule  follow-up appointments as needed - Contact Kiara doctor if symptoms change or worsen.         Ashok Croon, MD Otolaryngology [facility] Phone: [phone] Fax: [phone]    02/10/2024, 5:38 AM

## 2024-02-15 DIAGNOSIS — J470 Bronchiectasis with acute lower respiratory infection: Secondary | ICD-10-CM | POA: Diagnosis not present

## 2024-02-16 DIAGNOSIS — B0052 Herpesviral keratitis: Secondary | ICD-10-CM | POA: Diagnosis not present

## 2024-02-16 DIAGNOSIS — H02403 Unspecified ptosis of bilateral eyelids: Secondary | ICD-10-CM | POA: Diagnosis not present

## 2024-02-16 DIAGNOSIS — H11823 Conjunctivochalasis, bilateral: Secondary | ICD-10-CM | POA: Diagnosis not present

## 2024-02-16 DIAGNOSIS — Z961 Presence of intraocular lens: Secondary | ICD-10-CM | POA: Diagnosis not present

## 2024-02-24 ENCOUNTER — Other Ambulatory Visit: Payer: Self-pay | Admitting: Pulmonary Disease

## 2024-02-25 ENCOUNTER — Telehealth: Payer: Self-pay | Admitting: Pulmonary Disease

## 2024-02-25 NOTE — Telephone Encounter (Signed)
 Patient states needs refill for Albuterol inhaler. Pharmacy is [facility] [place]. Patient phone number is [phone].

## 2024-02-25 NOTE — Telephone Encounter (Signed)
This was already filled today

## 2024-02-28 ENCOUNTER — Ambulatory Visit: Payer: Medicare Other | Admitting: Infectious Disease

## 2024-02-28 DIAGNOSIS — R7301 Impaired fasting glucose: Secondary | ICD-10-CM | POA: Diagnosis not present

## 2024-02-28 DIAGNOSIS — K219 Gastro-esophageal reflux disease without esophagitis: Secondary | ICD-10-CM | POA: Diagnosis not present

## 2024-02-28 DIAGNOSIS — A310 Pulmonary mycobacterial infection: Secondary | ICD-10-CM | POA: Diagnosis not present

## 2024-02-28 DIAGNOSIS — E559 Vitamin D deficiency, unspecified: Secondary | ICD-10-CM | POA: Diagnosis not present

## 2024-02-28 DIAGNOSIS — J479 Bronchiectasis, uncomplicated: Secondary | ICD-10-CM | POA: Diagnosis not present

## 2024-02-28 DIAGNOSIS — E785 Hyperlipidemia, unspecified: Secondary | ICD-10-CM | POA: Diagnosis not present

## 2024-03-02 ENCOUNTER — Other Ambulatory Visit: Payer: Self-pay

## 2024-03-02 ENCOUNTER — Encounter: Payer: Self-pay | Admitting: Infectious Disease

## 2024-03-02 ENCOUNTER — Ambulatory Visit: Admitting: Infectious Disease

## 2024-03-02 VITALS — BP 119/75 | HR 105 | Temp 98.2°F | Wt 95.2 lb

## 2024-03-02 DIAGNOSIS — J470 Bronchiectasis with acute lower respiratory infection: Secondary | ICD-10-CM | POA: Diagnosis not present

## 2024-03-02 DIAGNOSIS — A310 Pulmonary mycobacterial infection: Secondary | ICD-10-CM

## 2024-03-02 NOTE — Progress Notes (Signed)
 Subjective:  Chief complaint follow-up for Mycobacterium avium infection    Patient ID: Kiara Medina, female    DOB: [birth year], 78 y.o.   MRN: [id number]  HPI   Kiara Medina is a a 78 year old woman followed previously by my (now retired) partner Dr. 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  She was not treated at that time.  She tested positive again in March 2015.  She started on azithromycin, ethambutol and rifampin in December 2015 and completed the course of therapy in November 2016.   She relapsed in June 2018 and started on azithromycin, ethambutol and rifampin again.  She saw Dr. Zackery Barefoot at [facility] in November 2018.  Aerosolized amikacin was added in January 2019  because of evolving resistance.  In September 2019 azithromycin was changed to clofazimine and she continued ethambutol, rifampin and aerosolized amikacin.  Cayston was later stopped due to the fact that inhaling it seemed to be causing more problems with coughing.  At last visit she seemed to been relatively stable since then in terms of her coughing and her weight has been stable in the 100 to 103 pound range.  Since I last saw her she has been seen at [facility] several times by ID/Pulmonary MD, Dr. Drue Second who is a specialist in mycobacterial infections.  At present recommended reculturing her sputum for fresh culture data to obtain sensitivity data.  She also suggested the possibility of several potential other therapies: "dual beta lactam therapy" which I had not heard of but which involves using teflaro or cefdinir to restore susceptibility of the Mycobacterium to meropenem, versus addition of Nuzyra or  bedalaquine or potentially Zyvox or avelox --latter two having I activity.  Was against the idea of IV therapy though I think this fracture might be one of the more promising interventions.  I am skeptical she will be able to get either Nuzyra or  Bedalaquine--EVEN when been able to get Medicare approval the out-of-pocket cost for these medications with Medicare prescription plans are still thousands of dollars per month making them because prohibitive for most individuals.  The Zyvox at low-dose or moxifloxacin might be options though.  She is going to be seeing them again tomorrow and have cultures taken.  In terms of her symptomatology she has had a little bit more dyspnea recently she had a repeat since CT scan performed In February which showed:  MPRESSION: 1. Again seen is severe diffuse bilateral bronchiectasis, bronchial thickening, mucous plugging with extensive centrilobular and tree-in-bud nodularity. Scattered confluent areas of consolidative change with multifocal cavitary lesions are also again noted. Compared with the previous exam there has been significant interval progression of a chronic, thick walled on the previous exam right apical cavity measured 3.2 x 2.8 by 2.5 cm. Cavitary process within the right upper lobe which now measures 7.3 by 5.9 by 6.3 cm. 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 2. Coronary artery calcifications. 3.  Aortic Atherosclerosis (ICD10-I70.0).  Felt a little better since using nail nebulized saline and also corticosteroids as prescribed by Dr. Judeth Horn.   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   Sinusitis     Past Surgical History:  Procedure Laterality Date   APPENDECTOMY     CATARACT EXTRACTION  08/2013  CHOLECYSTECTOMY     TUBAL LIGATION     VESICOVAGINAL FISTULA CLOSURE W/ TAH      Family History  Problem Relation Age of Onset   Emphysema Sister    Stroke Mother    Clotting disorder Mother       Social History   Socioeconomic History   Marital status: Widowed    Spouse name: Not  on file   Number of children: Not on file   Years of education: Not on file   Highest education level: Not on file  Occupational History   Occupation: realtor  Tobacco Use   Smoking status: Former    Current packs/day: 0.00    Average packs/day: 1 pack/day for 20.0 years (20.0 ttl pk-yrs)    Types: Cigarettes    Start date: 12/14/1965    Quit date: 12/14/1985    Years since quitting: 38.2   Smokeless tobacco: Never  Vaping Use   Vaping status: Never Used  Substance and Sexual Activity   Alcohol use: Not Currently    Alcohol/week: 1.0 standard drink of alcohol    Types: 1 Glasses of wine per week    Comment: occ   Drug use: No   Sexual activity: Not on file  Other Topics Concern   Not on file  Social History Narrative   Not on file   Social Drivers of Health   Financial Resource Strain: Not on file  Food Insecurity: Not on file  Transportation Needs: Not on file  Physical Activity: Not on file  Stress: Not on file  Social Connections: Not on file    No Known Allergies   Current Outpatient Medications:    acetaminophen (TYLENOL) 500 MG tablet, Take 1,000 mg by mouth every 6 (six) hours as needed for moderate pain., Disp: , Rfl:    acyclovir (ZOVIRAX) 400 MG tablet, Take 400 mg by mouth 2 (two) times daily., Disp: , Rfl:    albuterol (VENTOLIN HFA) 108 (90 Base) MCG/ACT inhaler, INHALE 2 PUFFS BY MOUTH EVERY 6 HOURS AS NEEDED FOR  WHEEZING  OR  SHORTNESS  OF  BREAH, Disp: 18 g, Rfl: 9   alendronate (FOSAMAX) 70 MG tablet, Take 70 mg by mouth once a week., Disp: , Rfl:    atorvastatin (LIPITOR) 20 MG tablet, Take 20 mg by mouth daily., Disp: , Rfl:    B Complex Vitamins (B COMPLEX-B12 PO), Take 1 tablet by mouth daily., Disp: , Rfl:    benzonatate (TESSALON) 200 MG capsule, Take 1 capsule (200 mg total) by mouth 3 (three) times daily as needed for cough., Disp: 90 capsule, Rfl: 3   BREZTRI AEROSPHERE 160-9-4.8 MCG/ACT AERO, Inhale 2 puffs into the lungs in the morning and at  bedtime., Disp: 10.7 g, Rfl: 4   calcium carbonate (OSCAL) 1500 (600 Ca) MG TABS tablet, Take 600 mg of elemental calcium by mouth 2 (two) times daily with a meal., Disp: , Rfl:    Cholecalciferol (VITAMIN D3) 125 MCG (5000 UT) TABS, Take 1 tablet by mouth daily., Disp: , Rfl:    CLOFAZIMINE PO, Take 100 mg by mouth daily., Disp: , Rfl:    ethambutol (MYAMBUTOL) 400 MG tablet, Take 2 tablets (800 mg total) by mouth daily., Disp: 180 tablet, Rfl: 11   Flaxseed, Linseed, (FLAX SEED OIL) 1300 MG CAPS, Take 1 tablet by mouth daily., Disp: , Rfl:    fluorometholone (FML) 0.1 % ophthalmic suspension, Apply to eye., Disp: , Rfl:    Melatonin 10 MG CAPS,  Take 10 mg by mouth at bedtime., Disp: , Rfl:    mirtazapine (REMERON) 15 MG tablet, Take by mouth., Disp: , Rfl:    mometasone (NASONEX) 50 MCG/ACT nasal spray, Place 2 sprays into the nose 2 (two) times daily., Disp: 17 each, Rfl: 12   omeprazole (PRILOSEC) 40 MG capsule, TAKE 1 CAPSULE DAILY, Disp: 90 capsule, Rfl: 3   predniSONE (DELTASONE) 10 MG tablet, Take by mouth., Disp: , Rfl:    predniSONE (DELTASONE) 5 MG tablet, Take 5 mg by mouth daily., Disp: , Rfl:    Probiotic Product (PROBIOTIC DAILY PO), Take 1 capsule by mouth daily., Disp: , Rfl:    Propylene Glycol (SYSTANE BALANCE OP), Place 1 drop into the left eye daily as needed (dry eyes)., Disp: , Rfl:    Respiratory Therapy Supplies (FLUTTER) DEVI, Use as directed., Disp: 1 each, Rfl: 0   rifampin (RIFADIN) 300 MG capsule, Take 2 capsules (600 mg total) by mouth daily., Disp: 180 capsule, Rfl: 11   Spacer/Aero-Holding Chambers (AEROCHAMBER MV) inhaler, Use as instructed, Disp: 1 each, Rfl: 2   TURMERIC PO, Take 2,000 mg by mouth daily. , Disp: , Rfl:   Review of Systems  Constitutional:  Negative for activity change, appetite change, chills, diaphoresis, fatigue, fever and unexpected weight change.  HENT:  Negative for congestion, rhinorrhea, sinus pressure, sneezing, sore throat and  trouble swallowing.   Eyes:  Negative for photophobia and visual disturbance.  Respiratory:  Positive for cough and shortness of breath. Negative for chest tightness, wheezing and stridor.   Cardiovascular:  Negative for chest pain, palpitations and leg swelling.  Gastrointestinal:  Negative for abdominal distention, abdominal pain, anal bleeding, blood in stool, constipation, diarrhea, nausea and vomiting.  Genitourinary:  Negative for difficulty urinating, dysuria, flank pain and hematuria.  Musculoskeletal:  Negative for arthralgias, back pain, gait problem, joint swelling and myalgias.  Skin:  Negative for color change, pallor, rash and wound.  Neurological:  Negative for dizziness, tremors, weakness and light-headedness.  Hematological:  Negative for adenopathy. Does not bruise/bleed easily.  Psychiatric/Behavioral:  Negative for agitation, behavioral problems, confusion, decreased concentration, dysphoric mood and sleep disturbance.        Objective:   Physical Exam Constitutional:      General: She is not in acute distress.    Appearance: Normal appearance. She is well-developed. She is not ill-appearing or diaphoretic.  HENT:     Head: Normocephalic and atraumatic.     Right Ear: Hearing and external ear normal.     Left Ear: Hearing and external ear normal.     Nose: No nasal deformity or rhinorrhea.  Eyes:     General: No scleral icterus.    Conjunctiva/sclera: Conjunctivae normal.     Right eye: Right conjunctiva is not injected.     Left eye: Left conjunctiva is not injected.     Pupils: Pupils are equal, round, and reactive to light.  Neck:     Vascular: No JVD.  Cardiovascular:     Rate and Rhythm: Normal rate and regular rhythm.     Heart sounds: S1 normal and S2 normal.  Pulmonary:     Effort: Pulmonary effort is normal. Prolonged expiration present.  Abdominal:     General: Bowel sounds are normal. There is no distension.     Palpations: Abdomen is soft.      Tenderness: There is no abdominal tenderness.  Musculoskeletal:        General: Normal range of motion.  Right shoulder: Normal.     Left shoulder: Normal.     Cervical back: Normal range of motion and neck supple.     Right hip: Normal.     Left hip: Normal.     Right knee: Normal.     Left knee: Normal.  Lymphadenopathy:     Head:     Right side of head: No submandibular, preauricular or posterior auricular adenopathy.     Left side of head: No submandibular, preauricular or posterior auricular adenopathy.     Cervical: No cervical adenopathy.     Right cervical: No superficial or deep cervical adenopathy.    Left cervical: No superficial or deep cervical adenopathy.  Skin:    General: Skin is warm and dry.     Coloration: Skin is not pale.     Findings: No abrasion, bruising, ecchymosis, erythema, lesion or rash.     Nails: There is no clubbing.  Neurological:     Mental Status: She is alert and oriented to person, place, and time.     Sensory: No sensory deficit.     Coordination: Coordination normal.     Gait: Gait normal.  Psychiatric:        Attention and Perception: She is attentive.        Speech: Speech normal.        Behavior: Behavior normal. Behavior is cooperative.        Thought Content: Thought content normal.        Judgment: Judgment normal.           Assessment & Plan:   Mycobacterial infection of the lungs:  Now she will continue with her rifampin ethambutol and clofazimine.  I think is a great idea for have cultures done at [facility] where they have a better microbiology lab that we have access to potentially even phage therapy could be tested.  I will have her follow-up with me in several months time.  We are certainly happy to help her since we are closer to her the A1c but I think Venn with her expertise in this area should be the drivers of most of the decision making here.  I have personally spent 42 minutes involved in face-to-face and  non-face-to-face activities for this patient on the day of the visit. Professional time spent includes the following activities: Preparing to see the patient (review of tests), Obtaining and/or reviewing separately obtained history (admission/discharge record), Performing a medically appropriate examination and/or evaluation , Ordering medications/tests/procedures, referring and communicating with other health care professionals, Documenting clinical information in the EMR, Independently interpreting results (not separately reported), Communicating results to the patient/family/caregiver, Counseling and educating the patient/family/caregiver and Care coordination (not separately reported).         Marland Kitchen

## 2024-03-03 DIAGNOSIS — Z79899 Other long term (current) drug therapy: Secondary | ICD-10-CM | POA: Diagnosis not present

## 2024-03-03 DIAGNOSIS — J470 Bronchiectasis with acute lower respiratory infection: Secondary | ICD-10-CM | POA: Diagnosis not present

## 2024-03-03 DIAGNOSIS — R059 Cough, unspecified: Secondary | ICD-10-CM | POA: Diagnosis not present

## 2024-03-03 DIAGNOSIS — Z8619 Personal history of other infectious and parasitic diseases: Secondary | ICD-10-CM | POA: Diagnosis not present

## 2024-03-03 DIAGNOSIS — R9431 Abnormal electrocardiogram [ECG] [EKG]: Secondary | ICD-10-CM | POA: Diagnosis not present

## 2024-03-03 DIAGNOSIS — R5383 Other fatigue: Secondary | ICD-10-CM | POA: Diagnosis not present

## 2024-03-03 DIAGNOSIS — Z87891 Personal history of nicotine dependence: Secondary | ICD-10-CM | POA: Diagnosis not present

## 2024-03-03 DIAGNOSIS — J3489 Other specified disorders of nose and nasal sinuses: Secondary | ICD-10-CM | POA: Diagnosis not present

## 2024-03-07 DIAGNOSIS — Z0000 Encounter for general adult medical examination without abnormal findings: Secondary | ICD-10-CM | POA: Diagnosis not present

## 2024-03-07 DIAGNOSIS — Z9181 History of falling: Secondary | ICD-10-CM | POA: Diagnosis not present

## 2024-03-14 DIAGNOSIS — Z8619 Personal history of other infectious and parasitic diseases: Secondary | ICD-10-CM | POA: Diagnosis not present

## 2024-03-15 DIAGNOSIS — H61002 Unspecified perichondritis of left external ear: Secondary | ICD-10-CM | POA: Diagnosis not present

## 2024-03-17 DIAGNOSIS — J470 Bronchiectasis with acute lower respiratory infection: Secondary | ICD-10-CM | POA: Diagnosis not present

## 2024-03-22 DIAGNOSIS — H11823 Conjunctivochalasis, bilateral: Secondary | ICD-10-CM | POA: Diagnosis not present

## 2024-03-22 DIAGNOSIS — Z961 Presence of intraocular lens: Secondary | ICD-10-CM | POA: Diagnosis not present

## 2024-03-22 DIAGNOSIS — H02403 Unspecified ptosis of bilateral eyelids: Secondary | ICD-10-CM | POA: Diagnosis not present

## 2024-03-22 DIAGNOSIS — B0052 Herpesviral keratitis: Secondary | ICD-10-CM | POA: Diagnosis not present

## 2024-04-03 DIAGNOSIS — J301 Allergic rhinitis due to pollen: Secondary | ICD-10-CM | POA: Diagnosis not present

## 2024-04-13 ENCOUNTER — Telehealth: Payer: Self-pay

## 2024-04-13 DIAGNOSIS — J479 Bronchiectasis, uncomplicated: Secondary | ICD-10-CM

## 2024-04-13 MED ORDER — ALBUTEROL SULFATE HFA 108 (90 BASE) MCG/ACT IN AERS: 2.0000 | INHALATION_SPRAY | Freq: Four times a day (QID) | RESPIRATORY_TRACT | 9 refills | Status: AC | PRN

## 2024-04-13 NOTE — Telephone Encounter (Signed)
 Copied from CRM [id number]. Topic: Clinical - Prescription Issue >> Apr 11, 2024  3:31 PM Margarette Shawl wrote:  Reason for CRM:   Pt is requesting if her prescription for albuterol  (VENTOLIN  HFA) 108 (90] Base) MCG/ACT inhaler could be transferred over to the [facility] on [address] in her chart.   She only used [facility] in the past so that she could utilize her Good Rx coupon; however, due to the category she is now in, she can receive her medications for free until the end of the year.  Spoke with patient to let her know I have sent in her script into her pharmacy as requested .  Patient's voice was understanding.Nothing else further needed.

## 2024-04-13 NOTE — Telephone Encounter (Signed)
 Copied from CRM [id number]. Topic: Clinical - Prescription Issue >> Apr 11, 2024  3:31 PM Margarette Shawl wrote:  Reason for CRM:   Pt is requesting if her prescription for albuterol  (VENTOLIN  HFA) 108 (90] Base) MCG/ACT inhaler could be transferred over to the [facility] on [address] in her chart.   She only used [facility] in the past so that she could utilize her Good Rx coupon; however, due to the category she is now in, she can receive her medications for free until the end of the year.

## 2024-04-16 DIAGNOSIS — J470 Bronchiectasis with acute lower respiratory infection: Secondary | ICD-10-CM | POA: Diagnosis not present

## 2024-04-18 DIAGNOSIS — D84821 Immunodeficiency due to drugs: Secondary | ICD-10-CM | POA: Diagnosis not present

## 2024-04-18 DIAGNOSIS — K219 Gastro-esophageal reflux disease without esophagitis: Secondary | ICD-10-CM | POA: Diagnosis not present

## 2024-04-19 ENCOUNTER — Telehealth: Payer: Self-pay

## 2024-04-19 NOTE — Telephone Encounter (Signed)
 Yes, no problem. I have 2 bottles here that she can pick up. Thanks!

## 2024-04-19 NOTE — Telephone Encounter (Signed)
 Patient called stating that she will need a refill on clofazimine  and will be in [facility] tomorrow if she could come by then. Informed her I will relay message to pharmacy staff - she asked if someone could let her know if its ok to come by.   Kiara Medina Kiara Medina, CMA

## 2024-04-24 ENCOUNTER — Telehealth: Payer: Self-pay | Admitting: Pharmacist

## 2024-04-24 NOTE — Telephone Encounter (Signed)
 Patient picked up 2 bottles of clofazimine  on 04/20/24. Supply should last for ~3 months. Next refill due around the middle/end of August.  Bohden Dung L. Mana Haberl, PharmD, BCIDP, AAHIVP, CPP Clinical Pharmacist Practitioner Infectious Diseases Clinical Pharmacist [facility] 04/24/2024, 10:27 AM

## 2024-04-26 DIAGNOSIS — L570 Actinic keratosis: Secondary | ICD-10-CM | POA: Diagnosis not present

## 2024-05-01 ENCOUNTER — Telehealth: Payer: Self-pay | Admitting: Pharmacist

## 2024-05-01 NOTE — Telephone Encounter (Signed)
 2 bottles of clofazimine  are located in the pharmacy office when patient needs a refill.  Kiara Medina, PharmD, BCIDP, AAHIVP, CPP Infectious Diseases Clinical Pharmacist Practitioner Clinical Pharmacist Lead, [facility] 05/01/2024, 4:17 PM

## 2024-05-10 ENCOUNTER — Encounter: Payer: Self-pay | Admitting: Pulmonary Disease

## 2024-05-10 ENCOUNTER — Ambulatory Visit: Payer: Medicare Other | Admitting: Pulmonary Disease

## 2024-05-10 VITALS — BP 130/60 | HR 102 | Temp 97.7°F | Ht 63.0 in | Wt 97.0 lb

## 2024-05-10 DIAGNOSIS — J479 Bronchiectasis, uncomplicated: Secondary | ICD-10-CM | POA: Diagnosis not present

## 2024-05-10 DIAGNOSIS — Z87891 Personal history of nicotine dependence: Secondary | ICD-10-CM | POA: Diagnosis not present

## 2024-05-10 DIAGNOSIS — A310 Pulmonary mycobacterial infection: Secondary | ICD-10-CM

## 2024-05-10 MED ORDER — BREZTRI AEROSPHERE 160-9-4.8 MCG/ACT IN AERO: 2.0000 | INHALATION_SPRAY | Freq: Two times a day (BID) | RESPIRATORY_TRACT | 12 refills | Status: AC

## 2024-05-10 NOTE — Patient Instructions (Signed)
 No changes to medication  Breztri  refilled  I am glad you are doing better  Return to clinic in 3 months or sooner as needed with Dr. Marygrace Snellen

## 2024-05-10 NOTE — Progress Notes (Signed)
 Synopsis: Referred in 2010 for bronchiectasis by Adrian Hopper, MD.  Previously patient of Dr. Kelton Patron and Dr. Elverna Hamman and Dr. Fulton Job.  Subjective:   PATIENT ID: Kiara Medina GENDER: female DOB: 01-23-1946, MRN: [id number]  Chief Complaint  Patient presents with   Follow-up    Doing well.   Kiara Medina is a 78 y.o. woman with a history of MDR MAC and bronchiectasis who returns for routine follow-up.  Most recent infectious disease note x 2 from [facility] reviewed.  She appears brighter, overall improved, feeling better.  Cough a bit better.  Sinus congestion is improved.  As such cough is less wet, more dry.  Maintained on multidrug therapy for MAI.  Recent change in medications noted.  Was having more night sweats but now this is better.  Has gained 1 pound in the interim.  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   Sinusitis      Family History  Problem Relation Age of Onset   Emphysema Sister    Stroke Mother    Clotting disorder Mother      Past Surgical History:  Procedure Laterality Date   APPENDECTOMY     CATARACT EXTRACTION  08/2013   CHOLECYSTECTOMY     TUBAL LIGATION     VESICOVAGINAL FISTULA CLOSURE W/ TAH      Social History   Socioeconomic History   Marital status: Widowed    Spouse name: Not on file   Number of children: Not on file   Years of education: Not on file   Highest education level: Not on file  Occupational History   Occupation: realtor  Tobacco Use   Smoking status: Former    Current packs/day: 0.00    Average packs/day: 1 pack/day for 20.0 years (20.0 ttl pk-yrs)    Types: Cigarettes    Start date: 12/14/1965    Quit date: 12/14/1985    Years since quitting: 38.4   Smokeless tobacco: Never  Vaping Use   Vaping status: Never Used  Substance and Sexual Activity   Alcohol  use: Not Currently     Alcohol /week: 1.0 standard drink of alcohol     Types: 1 Glasses of wine per week    Comment: occ   Drug use: No   Sexual activity: Not on file  Other Topics Concern   Not on file  Social History Narrative   Not on file   Social Drivers of Health   Financial Resource Strain: Not on file  Food Insecurity: Not on file  Transportation Needs: Not on file  Physical Activity: Not on file  Stress: Not on file  Social Connections: Not on file  Intimate Partner Violence: Not on file     No Known Allergies   Immunization History  Administered Date(s) Administered   Fluad Quad(high Dose 65+) 09/24/2020   Influenza Split 09/13/2012, 09/06/2014   Influenza Whole 09/09/2009, 08/14/2010, 09/14/2011   Influenza, High Dose Seasonal PF 10/10/2015, 09/02/2021   Influenza,inj,Quad PF,6+ Mos 09/13/2013, 09/21/2016, 08/26/2017, 08/22/2018   Influenza-Unspecified 08/29/2019, 09/21/2022   Moderna Sars-Covid-2 Vaccination 01/03/2020, 01/31/2020, 08/02/2020, 05/06/2021   PNEUMOCOCCAL CONJUGATE-20 08/12/2023   Pfizer Covid-19 Vaccine Bivalent Booster 5y-11y 11/24/2021   Pneumococcal Conjugate-13 11/24/2014   Pneumococcal Polysaccharide-23 07/17/2009   RSV,unspecified 10/30/2022    Outpatient Medications Prior to Visit  Medication Sig Dispense Refill   acetaminophen (TYLENOL) 500  MG tablet Take 1,000 mg by mouth every 6 (six) hours as needed for moderate pain.     acyclovir  (ZOVIRAX ) 400 MG tablet Take 400 mg by mouth 2 (two) times daily.     albuterol  (VENTOLIN  HFA) 108 (90 Base) MCG/ACT inhaler Inhale 2 puffs into the lungs every 6 (six) hours as needed for wheezing or shortness of breath. 18 g 9   alendronate (FOSAMAX) 70 MG tablet Take 70 mg by mouth once a week.     atorvastatin  (LIPITOR) 20 MG tablet Take 20 mg by mouth daily.     B Complex Vitamins (B COMPLEX-B12 PO) Take 1 tablet by mouth daily.     benzonatate  (TESSALON ) 200 MG capsule Take 1 capsule (200 mg total) by mouth 3 (three) times  daily as needed for cough. 90 capsule 3   calcium  carbonate (OSCAL) 1500 (600 Ca) MG TABS tablet Take 600 mg of elemental calcium  by mouth 2 (two) times daily with a meal.     Cholecalciferol  (VITAMIN D3) 125 MCG (5000 UT) TABS Take 1 tablet by mouth daily.     CLOFAZIMINE  PO Take 100 mg by mouth daily.     ethambutol  (MYAMBUTOL ) 400 MG tablet Take 2 tablets (800 mg total) by mouth daily. 180 tablet 11   Flaxseed, Linseed, (FLAX SEED OIL) 1300 MG CAPS Take 1 tablet by mouth daily.     fluorometholone (FML) 0.1 % ophthalmic suspension Apply to eye.     Melatonin 10 MG CAPS Take 10 mg by mouth at bedtime.     mometasone  (NASONEX ) 50 MCG/ACT nasal spray Place 2 sprays into the nose 2 (two) times daily. 17 each 12   NUZYRA 150 MG TABS Take 150 mg by mouth 2 (two) times daily.     omeprazole  (PRILOSEC) 40 MG capsule TAKE 1 CAPSULE DAILY 90 capsule 3   predniSONE  (DELTASONE ) 5 MG tablet Take 5 mg by mouth daily.     Probiotic Product (PROBIOTIC DAILY PO) Take 1 capsule by mouth daily.     Propylene Glycol (SYSTANE BALANCE OP) Place 1 drop into the left eye daily as needed (dry eyes).     Respiratory Therapy Supplies (FLUTTER) DEVI Use as directed. 1 each 0   rifabutin (MYCOBUTIN) 150 MG capsule Take 300 mg by mouth 2 (two) times daily.     sodium chloride  HYPERTONIC 3 % nebulizer solution Take 4 mLs by nebulization as needed.     Spacer/Aero-Holding Chambers (AEROCHAMBER MV) inhaler Use as instructed 1 each 2   TURMERIC PO Take 2,000 mg by mouth daily.      BREZTRI  AEROSPHERE 160-9-4.8 MCG/ACT AERO Inhale 2 puffs into the lungs in the morning and at bedtime. 10.7 g 4   mirtazapine (REMERON) 15 MG tablet Take by mouth.     predniSONE  (DELTASONE ) 10 MG tablet Take by mouth. (Patient not taking: Reported on 05/10/2024)     rifampin  (RIFADIN ) 300 MG capsule Take 2 capsules (600 mg total) by mouth daily. (Patient not taking: Reported on 05/10/2024) 180 capsule 11   No facility-administered medications  prior to visit.    Review of Systems  N/a   Objective:   Vitals:   05/10/24 1509  BP: 130/60  Pulse: (!) 102  Temp: 97.7 F (36.5 C)  TempSrc: Oral  SpO2: 96%  Weight: 97 lb (44 kg)  Height: 5\' 3"  (1.6 m)       96% on   RA BMI Readings from Last 3 Encounters:  05/10/24 17.18 kg/m  03/02/24 16.86 kg/m  02/07/24 17.01 kg/m   Wt Readings from Last 3 Encounters:  05/10/24 97 lb (44 kg)  03/02/24 95 lb 3.2 oz (43.2 kg)  02/07/24 96 lb (43.5 kg)    Physical Exam BP 130/60 (BP Location: Left Arm, Patient Position: Sitting, Cuff Size: Normal)   Pulse (!) 102   Temp 97.7 F (36.5 C) (Oral)   Ht 5\' 3"  (1.6 m)   Wt 97 lb (44 kg)   SpO2 96%   BMI 17.18 kg/m  General: Well-appearing in no acute distress, thin Eyes: EOMI, icterus Respiratory: Clear, no wheeze, normal work of breathing Cardiovascular: Regular rhythm, no murmur Extremities: Warm, no edema     CBC    Component Value Date/Time   WBC 10.3 06/02/2023 0322   RBC 4.61 06/02/2023 0322   HGB 13.1 06/02/2023 0322   HCT 40.7 06/02/2023 0322   PLT 272 06/02/2023 0322   MCV 88.3 06/02/2023 0322   MCH 28.4 06/02/2023 0322   MCHC 32.2 06/02/2023 0322   RDW 14.6 06/02/2023 0322   LYMPHSABS 999 06/02/2023 0322   MONOABS 0.5 09/24/2021 0519   EOSABS 31 06/02/2023 0322   BASOSABS 31 06/02/2023 0322    CHEMISTRY No results for input(s): "NA", "K", "CL", "CO2", "GLUCOSE", "BUN", "CREATININE", "CALCIUM ", "MG", "PHOS" in the last 168 hours. CrCl cannot be calculated (Patient's most recent lab result is older than the maximum 21 days allowed.).  12/28/2019: IgG 954 IgM 96 IgA 289 IgE 10 IgG subclasses normal (other than increased IgG subclass 3)  Cultures: 03/2024 smear + MAI 02/20/21 - MAI 11/23/2019-MAI 05/17/2019-MAI 03/15/2019-MAI 01/10/2019 MAI 11/07/2018-MAI 10/10/2018-MAI  08/22/2018-MAI 07/11/2018-MAI 05/31/2018-MAI MAI susceptibilities-resistant clarithromycin, linezolid.  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  Susceptible to amikacin , cefepime, clarithromycin, imipenem, linezolid, trimethoprim sulfamethoxazole. 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  Tree-in-bud opacities diffusely.  Mucus impaction in airways.  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     Assessment & Plan:     ICD-10-CM   1. 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 BRONCHIECTASIS  J47.9       Dyspnea on exertion likely due to chronic bronchiectasis and asthma-improved on LAMA/LABA; worsened when back off these meds. Component of deconditioning.  Has seasonal allergies, describes bronchospasm. - Unclear benefit with inhalers in the past, (Stiolto and Trelegy), mild improvement on Breztri , to continue with spacer -Continue using albuterol  as needed --TTE reassuring 2022 -- s/p pulmonary rehab  Asthma: Cough less productive.  More like bronchospasm.  Must be cautious withICS in setting of  MDR NTM. 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  Progressive on CT over the  last several years, particular right upper lobe cavitary lesion.  No evidence of hypersensitivity pneumonitis  from inhaled amikacin  on CT scan.  Amikacin  stopped due to worsening of cough, subjective cough.  Unfortunate she has had progressive disease over time.  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  Suspect much of what is left is just palliation.  Reasonable to assess for second opinion and possible clinical trials etc. - Hypertonic saline twice a day  -- Continue vest therapy -Continue current multi drug MAI therapy and ID follow-up via [facility] and locally  Weight loss- concerned this may be a MAC secondary effect, complicated by fatigue and DOE.  Related to metabolic needs from dyspnea, cough.  Poor appetite.  This is ongoing. Weight slowly trending down.  She has upcoming appointment with nutritionist via [facility].  Healthcare maintenance: -- Prevnar 20 07/2023  RTC in 3 months with Dr. Marygrace Snellen.     Current Outpatient Medications:    acetaminophen (TYLENOL) 500 MG tablet, Take 1,000 mg by mouth every 6 (six) hours as needed for moderate pain., Disp: , Rfl:    acyclovir  (ZOVIRAX ) 400 MG tablet, Take 400 mg by mouth 2 (two) times daily., Disp: , Rfl:    albuterol  (VENTOLIN  HFA) 108 (90 Base) MCG/ACT inhaler, Inhale 2 puffs into the lungs every 6 (six) hours as needed for wheezing or shortness of breath., Disp: 18 g, Rfl: 9   alendronate (FOSAMAX) 70 MG tablet, Take 70 mg by mouth once a week., Disp: , Rfl:    atorvastatin  (LIPITOR) 20 MG tablet, Take 20 mg by mouth daily., Disp: , Rfl:    B Complex Vitamins (B COMPLEX-B12 PO), Take 1 tablet by mouth daily., Disp: , Rfl:    benzonatate  (TESSALON ) 200 MG capsule, Take 1 capsule (200 mg total) by mouth 3 (three) times daily as needed for cough., Disp: 90 capsule, Rfl: 3   calcium  carbonate (OSCAL) 1500 (600 Ca) MG TABS tablet, Take 600 mg of elemental calcium  by mouth 2 (two) times daily with a meal., Disp: , Rfl:    Cholecalciferol  (VITAMIN D3) 125 MCG (5000 UT)  TABS, Take 1 tablet by mouth daily., Disp: , Rfl:    CLOFAZIMINE  PO, Take 100 mg by mouth daily., Disp: , Rfl:    ethambutol  (MYAMBUTOL ) 400 MG tablet, Take 2 tablets (800 mg total) by mouth daily., Disp: 180 tablet, Rfl: 11   Flaxseed, Linseed, (FLAX SEED OIL) 1300 MG CAPS, Take 1 tablet by mouth daily., Disp: , Rfl:    fluorometholone (FML) 0.1 % ophthalmic suspension, Apply to eye., Disp: , Rfl:    Melatonin 10 MG CAPS, Take 10 mg by mouth at bedtime., Disp: , Rfl:    mometasone  (NASONEX ) 50 MCG/ACT nasal spray, Place 2 sprays into the nose 2 (two) times daily., Disp: 17 each, Rfl: 12   NUZYRA 150 MG TABS, Take 150 mg by mouth 2 (two) times daily., Disp: , Rfl:    omeprazole  (PRILOSEC) 40 MG capsule, TAKE 1 CAPSULE DAILY, Disp: 90 capsule, Rfl: 3   predniSONE  (DELTASONE ) 5 MG tablet, Take 5 mg by mouth daily., Disp: , Rfl:    Probiotic Product (PROBIOTIC DAILY PO), Take 1 capsule by mouth daily., Disp: , Rfl:    Propylene Glycol (SYSTANE BALANCE OP), Place 1 drop into the left eye daily as needed (dry eyes)., Disp: , Rfl:    Respiratory Therapy Supplies (FLUTTER) DEVI, Use as directed., Disp: 1 each, Rfl: 0   rifabutin (MYCOBUTIN) 150 MG capsule, Take 300 mg by mouth 2 (two) times daily., Disp: , Rfl:    sodium chloride  HYPERTONIC 3 % nebulizer  solution, Take 4 mLs by nebulization as needed., Disp: , Rfl:    Spacer/Aero-Holding Chambers (AEROCHAMBER MV) inhaler, Use as instructed, Disp: 1 each, Rfl: 2   TURMERIC PO, Take 2,000 mg by mouth daily. , Disp: , Rfl:    budesonide-glycopyrrolate-formoterol  (BREZTRI  AEROSPHERE) 160-9-4.8 MCG/ACT AERO inhaler, Inhale 2 puffs into the lungs in the morning and at bedtime., Disp: 1 each, Rfl: 12   mirtazapine (REMERON) 15 MG tablet, Take by mouth., Disp: , Rfl:

## 2024-05-16 NOTE — Progress Notes (Unsigned)
 Subjective:  Chief complaint follow-up for Mycobacterium avium infection    Patient ID: Kiara Medina, female    DOB: 01/26/1946, 78 y.o.   MRN: [id number]  HPI   Kiara Medina is a a 78 year old woman followed previously by my (now retired) partner Dr. Cory Dingwall for her  relapsed Mycobacterium avium pneumonia. It was first cultured in February 2012.  She was not treated at that time.  She tested positive again in March 2015.  She started on azithromycin , ethambutol  and rifampin  in December 2015 and completed the course of therapy in November 2016.   She relapsed in June 2018 and started on azithromycin , ethambutol  and rifampin  again.  She saw Dr. Mel Spine at [facility] in November 2018.  Aerosolized amikacin  was added in January 2019  because of evolving resistance.  In September 2019 azithromycin  was changed to clofazimine  and she continued ethambutol , rifampin  and aerosolized amikacin .  Cayston was later stopped due to the fact that inhaling it seemed to be causing more problems with coughing.  At last visit she seemed to been relatively stable since then in terms of her coughing and her weight has been stable in the 100 to 103 pound range.  Since I last saw her she has been seen at [facility] several times by ID/Pulmonary MD, Dr. Everardo Hitch who is a specialist in mycobacterial infections.  At present recommended reculturing her sputum for fresh culture data to obtain sensitivity data.  She also suggested the possibility of several potential other therapies: "dual beta lactam therapy" which I had not heard of but which involves using teflaro or cefdinir to restore susceptibility of the Mycobacterium to meropenem, versus addition of Nuzyra or  bedalaquine or potentially Zyvox or avelox --latter two having I activity.  Was against the idea of IV therapy though I think this strategy might be one of the more promising interventions.  I was skeptical she will be able to get either Nuzyra or  Bedalaquine--EVEN when been able to get Medicare approval the out-of-pocket cost for these medications with Medicare prescription plans are still thousands of dollars per month making them because prohibitive for most individuals.  The Zyvox at low-dose or moxifloxacin might be options though.  She is going to be seeing them again tomorrow and have cultures taken.  In terms of her symptomatology she has had a little bit more dyspnea recently she had a repeat since CT scan performed In February which showed:  MPRESSION: 1. Again seen is severe diffuse bilateral bronchiectasis, bronchial thickening, mucous plugging with extensive centrilobular and tree-in-bud nodularity. Scattered confluent areas of consolidative change with multifocal cavitary lesions are also again noted. Compared with the previous exam there has been significant interval progression of a chronic, thick walled on the previous exam right apical cavity measured 3.2 x 2.8 by 2.5 cm. Cavitary process within the right upper lobe which now measures 7.3 by 5.9 by 6.3 cm. 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 2. Coronary artery calcifications. 3.  Aortic Atherosclerosis (ICD10-I70.0).  Since last saw her she had sputum  sent from [facility] that grew M avium with following sensis:  MAC SUSCEPTIBILITY BROTH  Component 2 mo ago Comments  ORGANISM ID Comment Abnormal  Mycobacterium avium complex  Amikacin  256 ug/mL Resistant For Mycobacterium avium-intracellulare complex, the interpretive criteria for amikacin  apply to IV treatment. If using amikacin  (liposomal, inhaled), the MIC interpretive breakpoints are <= 64ug/mL Susceptible, >= 128 ug/mL Resistant (CLSI M24S).  Ciprofloxacin  4.0 ug/mL  Clarithromycin >64.0 ug/mL Resistant   Clofazimine  CANCELED Test not performed  Result canceled by the ancillary.  Doxycycline  >8.0 ug/mL   Linezolid 32.0 ug/mL Resistant   Minocycline >8.0 ug/mL    Moxifloxacin 1.0 ug/mL Susceptible   Rifabutin 0.5 ug/mL   Rifampin  4.0 ug/mL   Streptomycin 32.0 ug/mL   Trimethoprim/Sulfa 2/38 ug/mL   Resulting Agency 01   Narrative Performed by [facility]   Patient had rifabutin exchanged for rifampin , continuing ethambutol  and clofazamine and adding omadacycline.     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   Sinusitis     Past Surgical History:  Procedure Laterality Date   APPENDECTOMY     CATARACT EXTRACTION  08/2013   CHOLECYSTECTOMY     TUBAL LIGATION     VESICOVAGINAL FISTULA CLOSURE W/ TAH      Family History  Problem Relation Age of Onset   Emphysema Sister    Stroke Mother    Clotting disorder Mother       Social History   Socioeconomic History   Marital status: Widowed    Spouse name: Not on file   Number of children: Not on file   Years of education: Not on file   Highest education level: Not on file  Occupational History   Occupation: realtor  Tobacco Use   Smoking status: Former    Current packs/day: 0.00    Average packs/day: 1 pack/day for 20.0 years (20.0 ttl pk-yrs)    Types: Cigarettes    Start date: 12/14/1965    Quit date: 12/14/1985    Years since quitting: 38.4   Smokeless tobacco: Never  Vaping Use   Vaping status: Never Used  Substance and Sexual Activity   Alcohol  use: Not Currently    Alcohol /week: 1.0 standard drink of alcohol     Types: 1 Glasses of wine per week    Comment: occ   Drug use: No   Sexual activity: Not on file  Other Topics Concern   Not on file  Social History Narrative   Not on file   Social Drivers of Health   Financial Resource Strain: Not on file  Food Insecurity: Not on file  Transportation Needs: Not on file  Physical Activity: Not on file  Stress: Not on file  Social Connections: Not on file    No Known  Allergies   Current Outpatient Medications:    acetaminophen (TYLENOL) 500 MG tablet, Take 1,000 mg by mouth every 6 (six) hours as needed for moderate pain., Disp: , Rfl:    acyclovir  (ZOVIRAX ) 400 MG tablet, Take 400 mg by mouth 2 (two) times daily., Disp: , Rfl:    albuterol  (VENTOLIN  HFA) 108 (90 Base) MCG/ACT inhaler, Inhale 2 puffs into the lungs every 6 (six) hours as needed for wheezing or shortness of breath., Disp: 18 g, Rfl: 9   alendronate (FOSAMAX) 70 MG tablet, Take 70 mg by mouth once a week., Disp: , Rfl:    atorvastatin  (LIPITOR) 20 MG tablet, Take 20 mg by mouth daily., Disp: , Rfl:    B Complex Vitamins (B COMPLEX-B12 PO), Take 1 tablet by mouth daily., Disp: , Rfl:    benzonatate  (TESSALON ) 200 MG capsule, Take 1 capsule (200 mg total) by mouth 3 (three) times daily as needed for cough., Disp: 90 capsule, Rfl: 3   budesonide-glycopyrrolate-formoterol  (BREZTRI  AEROSPHERE) 160-9-4.8  MCG/ACT AERO inhaler, Inhale 2 puffs into the lungs in the morning and at bedtime., Disp: 1 each, Rfl: 12   calcium  carbonate (OSCAL) 1500 (600 Ca) MG TABS tablet, Take 600 mg of elemental calcium  by mouth 2 (two) times daily with a meal., Disp: , Rfl:    Cholecalciferol  (VITAMIN D3) 125 MCG (5000 UT) TABS, Take 1 tablet by mouth daily., Disp: , Rfl:    CLOFAZIMINE  PO, Take 100 mg by mouth daily., Disp: , Rfl:    ethambutol  (MYAMBUTOL ) 400 MG tablet, Take 2 tablets (800 mg total) by mouth daily., Disp: 180 tablet, Rfl: 11   Flaxseed, Linseed, (FLAX SEED OIL) 1300 MG CAPS, Take 1 tablet by mouth daily., Disp: , Rfl:    fluorometholone (FML) 0.1 % ophthalmic suspension, Apply to eye., Disp: , Rfl:    Melatonin 10 MG CAPS, Take 10 mg by mouth at bedtime., Disp: , Rfl:    mirtazapine (REMERON) 15 MG tablet, Take by mouth., Disp: , Rfl:    mometasone  (NASONEX ) 50 MCG/ACT nasal spray, Place 2 sprays into the nose 2 (two) times daily., Disp: 17 each, Rfl: 12   NUZYRA 150 MG TABS, Take 150 mg by mouth 2  (two) times daily., Disp: , Rfl:    omeprazole  (PRILOSEC) 40 MG capsule, TAKE 1 CAPSULE DAILY, Disp: 90 capsule, Rfl: 3   predniSONE  (DELTASONE ) 5 MG tablet, Take 5 mg by mouth daily., Disp: , Rfl:    Probiotic Product (PROBIOTIC DAILY PO), Take 1 capsule by mouth daily., Disp: , Rfl:    Propylene Glycol (SYSTANE BALANCE OP), Place 1 drop into the left eye daily as needed (dry eyes)., Disp: , Rfl:    Respiratory Therapy Supplies (FLUTTER) DEVI, Use as directed., Disp: 1 each, Rfl: 0   rifabutin (MYCOBUTIN) 150 MG capsule, Take 300 mg by mouth 2 (two) times daily., Disp: , Rfl:    sodium chloride  HYPERTONIC 3 % nebulizer solution, Take 4 mLs by nebulization as needed., Disp: , Rfl:    Spacer/Aero-Holding Chambers (AEROCHAMBER MV) inhaler, Use as instructed, Disp: 1 each, Rfl: 2   TURMERIC PO, Take 2,000 mg by mouth daily. , Disp: , Rfl:   Review of Systems     Objective:   Physical Exam        Assessment & Plan:         .

## 2024-05-17 ENCOUNTER — Encounter: Payer: Self-pay | Admitting: Infectious Disease

## 2024-05-17 ENCOUNTER — Other Ambulatory Visit: Payer: Self-pay

## 2024-05-17 ENCOUNTER — Ambulatory Visit: Payer: Self-pay | Admitting: Infectious Disease

## 2024-05-17 VITALS — BP 126/62 | HR 114 | Temp 97.8°F | Ht 63.0 in | Wt 95.2 lb

## 2024-05-17 DIAGNOSIS — A310 Pulmonary mycobacterial infection: Secondary | ICD-10-CM

## 2024-05-17 DIAGNOSIS — F329 Major depressive disorder, single episode, unspecified: Secondary | ICD-10-CM | POA: Diagnosis not present

## 2024-05-17 DIAGNOSIS — R634 Abnormal weight loss: Secondary | ICD-10-CM

## 2024-05-17 DIAGNOSIS — J470 Bronchiectasis with acute lower respiratory infection: Secondary | ICD-10-CM | POA: Diagnosis not present

## 2024-05-24 DIAGNOSIS — H26491 Other secondary cataract, right eye: Secondary | ICD-10-CM | POA: Diagnosis not present

## 2024-05-26 DIAGNOSIS — Z8619 Personal history of other infectious and parasitic diseases: Secondary | ICD-10-CM | POA: Diagnosis not present

## 2024-05-30 DIAGNOSIS — E559 Vitamin D deficiency, unspecified: Secondary | ICD-10-CM | POA: Diagnosis not present

## 2024-05-30 DIAGNOSIS — R7301 Impaired fasting glucose: Secondary | ICD-10-CM | POA: Diagnosis not present

## 2024-05-30 DIAGNOSIS — E785 Hyperlipidemia, unspecified: Secondary | ICD-10-CM | POA: Diagnosis not present

## 2024-05-30 DIAGNOSIS — K219 Gastro-esophageal reflux disease without esophagitis: Secondary | ICD-10-CM | POA: Diagnosis not present

## 2024-06-16 DIAGNOSIS — J470 Bronchiectasis with acute lower respiratory infection: Secondary | ICD-10-CM | POA: Diagnosis not present

## 2024-06-27 ENCOUNTER — Other Ambulatory Visit (HOSPITAL_COMMUNITY): Payer: Self-pay

## 2024-06-27 ENCOUNTER — Telehealth: Payer: Self-pay

## 2024-06-27 DIAGNOSIS — A310 Pulmonary mycobacterial infection: Secondary | ICD-10-CM

## 2024-06-27 MED ORDER — ETHAMBUTOL HCL 400 MG PO TABS: 800.0000 mg | ORAL_TABLET | Freq: Every day | ORAL | 11 refills | Status: AC

## 2024-06-27 NOTE — Telephone Encounter (Signed)
-----   Message from Addie Arland SAILOR sent at 06/27/2024 11:09 AM EDT ----- Hello ,  Can you send her ethambutol  400mg  tabs to [facility] in [place] please its in her snap shot .    Thank you,  Arland Addie, CPhT Specialty Pharmacy Patient [facility] Phone: [phone] Fax: [phone]

## 2024-06-27 NOTE — Telephone Encounter (Signed)
 Refills sent to [facility]. Lorenda CHRISTELLA Code, RMA

## 2024-06-28 DIAGNOSIS — Z961 Presence of intraocular lens: Secondary | ICD-10-CM | POA: Diagnosis not present

## 2024-06-28 DIAGNOSIS — Z9889 Other specified postprocedural states: Secondary | ICD-10-CM | POA: Diagnosis not present

## 2024-06-28 DIAGNOSIS — H11823 Conjunctivochalasis, bilateral: Secondary | ICD-10-CM | POA: Diagnosis not present

## 2024-06-28 DIAGNOSIS — B0052 Herpesviral keratitis: Secondary | ICD-10-CM | POA: Diagnosis not present

## 2024-07-28 ENCOUNTER — Other Ambulatory Visit: Payer: Self-pay | Admitting: Medical Genetics

## 2024-08-10 ENCOUNTER — Telehealth: Payer: Self-pay | Admitting: Pharmacist

## 2024-08-10 NOTE — Telephone Encounter (Signed)
 Patient picked up 2 bottles of clofazimine  today. Supply should last for ~3 months. Next refill due around end of November/beginning of December.  Esaul Dorwart L. Tania Steinhauser, PharmD, BCIDP, AAHIVP, CPP Clinical Pharmacist Practitioner Infectious Diseases Clinical Pharmacist [facility] 08/10/2024, 1:57 PM

## 2024-08-24 ENCOUNTER — Encounter: Payer: Self-pay | Admitting: Pulmonary Disease

## 2024-08-24 ENCOUNTER — Ambulatory Visit: Admitting: Pulmonary Disease

## 2024-08-24 VITALS — BP 116/72 | HR 104 | Temp 97.5°F | Ht 63.0 in | Wt 94.0 lb

## 2024-08-24 DIAGNOSIS — A310 Pulmonary mycobacterial infection: Secondary | ICD-10-CM | POA: Diagnosis not present

## 2024-08-24 DIAGNOSIS — J479 Bronchiectasis, uncomplicated: Secondary | ICD-10-CM

## 2024-08-24 DIAGNOSIS — J45909 Unspecified asthma, uncomplicated: Secondary | ICD-10-CM | POA: Diagnosis not present

## 2024-08-24 DIAGNOSIS — R0609 Other forms of dyspnea: Secondary | ICD-10-CM

## 2024-08-24 DIAGNOSIS — Z87891 Personal history of nicotine dependence: Secondary | ICD-10-CM

## 2024-08-24 NOTE — Patient Instructions (Signed)
 It is good to see you again  I am glad you are doing relatively well  I would encourage the increased protein supplements if you can help gain weight  No changes to inhalers  Return to clinic in 3 months or sooner as needed with Dr. Annella

## 2024-08-24 NOTE — Progress Notes (Signed)
 Synopsis: Referred in 2010 for bronchiectasis by Keren Vicenta BRAVO, MD.  Previously patient of Dr. Chip and Dr. Alaine and Dr. Gretta.  Subjective:   PATIENT ID: Kiara Medina GENDER: female DOB: October 19, 1946, MRN: [id number]  Chief Complaint  Patient presents with   Medical Management of Chronic Issues    Bronchiectasis f/u   Kiara Medina is a 78 y.o. woman with a history of MDR MAC and bronchiectasis who returns for routine follow-up.  Most recent infectious disease note from [facility] reviewed.  Overall stable.  No exacerbations in the interim.  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  Clofazimine , omadacycline, ethambutol , rifampin .  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   Sinusitis      Family History  Problem Relation Age of Onset   Emphysema Sister    Stroke Mother    Clotting disorder Mother      Past Surgical History:  Procedure Laterality Date   APPENDECTOMY     CATARACT EXTRACTION  08/2013   CHOLECYSTECTOMY     TUBAL LIGATION     VESICOVAGINAL FISTULA CLOSURE W/ TAH      Social History   Socioeconomic History   Marital status: Widowed    Spouse name: Not on file   Number of children: Not on file   Years of education: Not on file   Highest education level: Not on file  Occupational History   Occupation: realtor  Tobacco Use   Smoking status: Former    Current packs/day: 0.00    Average packs/day: 1 pack/day for 20.0 years (20.0 ttl pk-yrs)    Types: Cigarettes    Start date: 12/14/1965    Quit date: 12/14/1985    Years since quitting: 38.7   Smokeless tobacco: Never  Vaping Use   Vaping status: Never Used  Substance and Sexual Activity   Alcohol  use: Not Currently    Alcohol /week: 1.0 standard drink of alcohol     Types: 1 Glasses of wine per week    Comment: occ   Drug use: No   Sexual activity: Not  on file  Other Topics Concern   Not on file  Social History Narrative   Not on file   Social Drivers of Health   Financial Resource Strain: Not on file  Food Insecurity: Not on file  Transportation Needs: Not on file  Physical Activity: Not on file  Stress: Not on file  Social Connections: Not on file  Intimate Partner Violence: Not on file     No Known Allergies   Immunization History  Administered Date(s) Administered   Fluad Quad(high Dose 65+) 09/24/2020   INFLUENZA, HIGH DOSE SEASONAL PF 10/10/2015, 09/02/2021   Influenza Split 09/13/2012, 09/06/2014   Influenza Whole 09/09/2009, 08/14/2010, 09/14/2011   Influenza,inj,Quad PF,6+ Mos 09/13/2013, 09/21/2016, 08/26/2017, 08/22/2018   Influenza-Unspecified 08/29/2019, 09/21/2022   Moderna Sars-Covid-2 Vaccination 01/03/2020, 01/31/2020, 08/02/2020, 05/06/2021   PNEUMOCOCCAL CONJUGATE-20 08/12/2023   Pfizer Covid-19 Vaccine Bivalent Booster 5y-11y 11/24/2021   Pneumococcal Conjugate-13 11/24/2014   Pneumococcal Polysaccharide-23 07/17/2009   RSV,unspecified 10/30/2022    Outpatient Medications Prior to Visit  Medication Sig Dispense Refill   acetaminophen (TYLENOL) 500 MG tablet Take 1,000 mg by mouth every 6 (six) hours as needed for moderate pain.     acyclovir  (ZOVIRAX ) 400 MG tablet Take 400 mg by mouth 2 (two) times daily.  albuterol  (VENTOLIN  HFA) 108 (90 Base) MCG/ACT inhaler Inhale 2 puffs into the lungs every 6 (six) hours as needed for wheezing or shortness of breath. 18 g 9   alendronate (FOSAMAX) 70 MG tablet Take 70 mg by mouth once a week.     atorvastatin  (LIPITOR) 20 MG tablet Take 20 mg by mouth daily.     B Complex Vitamins (B COMPLEX-B12 PO) Take 1 tablet by mouth daily.     benzonatate  (TESSALON ) 200 MG capsule Take 1 capsule (200 mg total) by mouth 3 (three) times daily as needed for cough. 90 capsule 3   budesonide-glycopyrrolate-formoterol  (BREZTRI  AEROSPHERE) 160-9-4.8 MCG/ACT AERO inhaler Inhale  2 puffs into the lungs in the morning and at bedtime. 1 each 12   calcium  carbonate (OSCAL) 1500 (600 Ca) MG TABS tablet Take 600 mg of elemental calcium  by mouth 2 (two) times daily with a meal.     Cholecalciferol  (VITAMIN D3) 125 MCG (5000 UT) TABS Take 1 tablet by mouth daily.     CLOFAZIMINE  PO Take 100 mg by mouth daily.     ethambutol  (MYAMBUTOL ) 400 MG tablet Take 2 tablets (800 mg total) by mouth daily. 180 tablet 11   Flaxseed, Linseed, (FLAX SEED OIL) 1300 MG CAPS Take 1 tablet by mouth daily.     ipratropium (ATROVENT) 0.06 % nasal spray Place 2 sprays into both nostrils 4 (four) times daily.     Melatonin 10 MG CAPS Take 10 mg by mouth at bedtime.     mirtazapine (REMERON) 15 MG tablet Take by mouth.     mometasone  (NASONEX ) 50 MCG/ACT nasal spray Place 2 sprays into the nose 2 (two) times daily. 17 each 12   NUZYRA 150 MG TABS Take 150 mg by mouth 2 (two) times daily.     omeprazole  (PRILOSEC) 40 MG capsule TAKE 1 CAPSULE DAILY 90 capsule 3   Probiotic Product (PROBIOTIC DAILY PO) Take 1 capsule by mouth daily.     Respiratory Therapy Supplies (FLUTTER) DEVI Use as directed. 1 each 0   rifabutin (MYCOBUTIN) 150 MG capsule Take 300 mg by mouth 2 (two) times daily.     sodium chloride  HYPERTONIC 3 % nebulizer solution Take 4 mLs by nebulization as needed.     Spacer/Aero-Holding Chambers (AEROCHAMBER MV) inhaler Use as instructed 1 each 2   TURMERIC PO Take 2,000 mg by mouth daily.      predniSONE  (DELTASONE ) 5 MG tablet Take 5 mg by mouth daily.     Propylene Glycol (SYSTANE BALANCE OP) Place 1 drop into the left eye daily as needed (dry eyes).     No facility-administered medications prior to visit.    Review of Systems  N/a   Objective:   Vitals:   08/24/24 1403  BP: 116/72  Pulse: (!) 104  Temp: (!) 97.5 F (36.4 C)  SpO2: 95%  Weight: 94 lb (42.6 kg)  Height: 5' 3 (1.6 m)       95% on   RA BMI Readings from Last 3 Encounters:  08/24/24 16.65 kg/m   05/17/24 16.86 kg/m  05/10/24 17.18 kg/m   Wt Readings from Last 3 Encounters:  08/24/24 94 lb (42.6 kg)  05/17/24 95 lb 3.2 oz (43.2 kg)  05/10/24 97 lb (44 kg)    Physical Exam BP 116/72   Pulse (!) 104   Temp (!) 97.5 F (36.4 C)   Ht 5' 3 (1.6 m)   Wt 94 lb (42.6 kg)   SpO2 95% Comment:  RA  BMI 16.65 kg/m  General: Well-appearing in no acute distress, thin Eyes: EOMI, icterus Respiratory: Clear, no wheeze, normal work of breathing Cardiovascular: Regular rhythm, no murmur Extremities: Warm, no edema     CBC    Component Value Date/Time   WBC 10.3 06/02/2023 0322   RBC 4.61 06/02/2023 0322   HGB 13.1 06/02/2023 0322   HCT 40.7 06/02/2023 0322   PLT 272 06/02/2023 0322   MCV 88.3 06/02/2023 0322   MCH 28.4 06/02/2023 0322   MCHC 32.2 06/02/2023 0322   RDW 14.6 06/02/2023 0322   LYMPHSABS 999 06/02/2023 0322   MONOABS 0.5 09/24/2021 0519   EOSABS 31 06/02/2023 0322   BASOSABS 31 06/02/2023 0322    CHEMISTRY No results for input(s): NA, K, CL, CO2, GLUCOSE, BUN, CREATININE, CALCIUM , MG, PHOS in the last 168 hours. CrCl cannot be calculated (Patient's most recent lab result is older than the maximum 21 days allowed.).  12/28/2019: IgG 954 IgM 96 IgA 289 IgE 10 IgG subclasses normal (other than increased IgG subclass 3)  Cultures: 03/2024 smear + MAI 02/20/21 - MAI 11/23/2019-MAI 05/17/2019-MAI 03/15/2019-MAI 01/10/2019 MAI 11/07/2018-MAI 10/10/2018-MAI  08/22/2018-MAI 07/11/2018-MAI 05/31/2018-MAI MAI susceptibilities-resistant clarithromycin, linezolid.  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  Susceptible to amikacin , cefepime, clarithromycin, imipenem, linezolid, trimethoprim sulfamethoxazole. 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  Tree-in-bud opacities diffusely.  Mucus impaction in airways.  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     Assessment & Plan:     ICD-10-CM   1. BRONCHIECTASIS  J47.9     2. 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 Component of deconditioning.  Has seasonal allergies, describes bronchospasm. - Unclear benefit with inhalers in the past, (Stiolto and Trelegy), mild improvement on Breztri , to continue with spacer -Continue using albuterol  as needed -- s/p pulmonary rehab  Asthma: Cough less productive.  More like bronchospasm.  Must be cautious withICS in setting of  MDR NTM. 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  Progressive on CT over the last several years, particular right upper lobe cavitary lesion.  No evidence of hypersensitivity pneumonitis from inhaled amikacin  on CT scan.  Amikacin  stopped due to worsening of cough, subjective cough.  Unfortunate she has had progressive disease over  time.  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  Suspect much of what is left is just palliation.  Reasonable to assess for second opinion and possible clinical trials etc. - Hypertonic saline twice a day  -- Continue vest therapy -Continue current multi drug MAI  therapy and ID follow-up via [facility]  -- We discussed Brinsupri, I am not opposed to using this although I want to make sure there are no interactions with her MAI therapy, notably we discussed today major limitation being that I severely doubt anyone enrolled in the trials was being treated for MDR MAI  Weight loss- concerned this may be a MAC secondary effect, complicated by fatigue and DOE.  Related to metabolic needs from dyspnea, cough.  Poor appetite.  This is ongoing. Weight slowly trending down.  Encourage additional protein supplements.  Healthcare maintenance: -- Prevnar 20 07/2023, declines COVID-vaccine  RTC in 3 months with Dr. Annella.     Current Outpatient Medications:    acetaminophen (TYLENOL) 500 MG tablet, Take 1,000 mg by mouth every 6 (six) hours as needed for moderate pain., Disp: , Rfl:    acyclovir  (ZOVIRAX ) 400 MG tablet, Take 400 mg by mouth 2 (two) times daily., Disp: , Rfl:    albuterol  (VENTOLIN  HFA) 108 (90 Base) MCG/ACT inhaler, Inhale 2 puffs into the lungs every 6 (six) hours as needed for wheezing or shortness of breath., Disp: 18 g, Rfl: 9   alendronate (FOSAMAX) 70 MG tablet, Take 70 mg by mouth once a week., Disp: , Rfl:    atorvastatin  (LIPITOR) 20 MG tablet, Take 20 mg by mouth daily., Disp: , Rfl:    B Complex Vitamins (B COMPLEX-B12 PO), Take 1 tablet by mouth daily., Disp: , Rfl:    benzonatate  (TESSALON ) 200 MG capsule, Take 1 capsule (200 mg total) by mouth 3 (three) times daily as needed for cough., Disp: 90 capsule, Rfl: 3   budesonide-glycopyrrolate-formoterol  (BREZTRI  AEROSPHERE) 160-9-4.8 MCG/ACT AERO inhaler, Inhale 2 puffs into the lungs in the morning and at bedtime., Disp: 1 each, Rfl: 12   calcium   carbonate (OSCAL) 1500 (600 Ca) MG TABS tablet, Take 600 mg of elemental calcium  by mouth 2 (two) times daily with a meal., Disp: , Rfl:    Cholecalciferol  (VITAMIN D3) 125 MCG (5000 UT) TABS, Take 1 tablet by mouth daily., Disp: , Rfl:    CLOFAZIMINE  PO, Take 100 mg by mouth daily., Disp: , Rfl:    ethambutol  (MYAMBUTOL ) 400 MG tablet, Take 2 tablets (800 mg total) by mouth daily., Disp: 180 tablet, Rfl: 11   Flaxseed, Linseed, (FLAX SEED OIL) 1300 MG CAPS, Take 1 tablet by mouth daily., Disp: , Rfl:    ipratropium (ATROVENT) 0.06 % nasal spray, Place 2 sprays into both nostrils 4 (four) times daily., Disp: , Rfl:    Melatonin 10 MG CAPS, Take 10 mg by mouth at bedtime., Disp: , Rfl:    mirtazapine (REMERON) 15 MG tablet, Take by mouth., Disp: , Rfl:    mometasone  (NASONEX ) 50 MCG/ACT nasal spray, Place 2 sprays into the nose 2 (two) times daily., Disp: 17 each, Rfl: 12   NUZYRA 150 MG TABS, Take 150 mg by mouth 2 (two) times daily., Disp: , Rfl:    omeprazole  (PRILOSEC) 40 MG capsule, TAKE 1 CAPSULE DAILY, Disp: 90 capsule, Rfl: 3   Probiotic Product (PROBIOTIC DAILY PO), Take 1 capsule by mouth daily., Disp: , Rfl:    Respiratory Therapy Supplies (FLUTTER) DEVI, Use as directed., Disp: 1 each, Rfl: 0   rifabutin (MYCOBUTIN) 150 MG capsule, Take 300 mg by mouth 2 (two) times daily., Disp: , Rfl:    sodium chloride  HYPERTONIC 3 % nebulizer solution, Take 4 mLs by nebulization as needed., Disp: , Rfl:    Spacer/Aero-Holding Chambers (AEROCHAMBER MV)  inhaler, Use as instructed, Disp: 1 each, Rfl: 2   TURMERIC PO, Take 2,000 mg by mouth daily. , Disp: , Rfl:    predniSONE  (DELTASONE ) 5 MG tablet, Take 5 mg by mouth daily., Disp: , Rfl:    Propylene Glycol (SYSTANE BALANCE OP), Place 1 drop into the left eye daily as needed (dry eyes)., Disp: , Rfl:

## 2024-08-29 ENCOUNTER — Telehealth: Payer: Self-pay | Admitting: *Deleted

## 2024-08-29 NOTE — Telephone Encounter (Signed)
 Could you please assist with Brinsupri new start?

## 2024-08-29 NOTE — Telephone Encounter (Signed)
 Pt requesting Brinsupri to be prescribed she did discuss with infectious disease provider.  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  Progressive on CT over the last several years, particular right upper lobe cavitary lesion.  No evidence of hypersensitivity pneumonitis from inhaled amikacin  on CT scan.  Amikacin  stopped due to worsening of cough, subjective cough.  Unfortunate she has had progressive disease over time.  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  Suspect much of what is left is just palliation.  Reasonable to assess for second opinion and possible clinical trials etc. - Hypertonic saline twice a day  -- Continue vest therapy -Continue current multi drug MAI therapy and ID follow-up via [facility]  -- We discussed Brinsupri, I am not opposed to using this although I want to make sure there are no interactions with her MAI therapy, notably we discussed today major limitation being that I severely doubt anyone enrolled in the trials was being treated for MDR MAI   Weight loss- concerned this may be a MAC secondary effect, complicated by fatigue and DOE.  Related to metabolic needs from dyspnea, cough.  Poor appetite.  This is ongoing. Weight slowly trending down.  Encourage additional protein supplements.   Healthcare maintenance: -- Prevnar 20 07/2023, declines COVID-vaccine   RTC in 3 months with Dr. Annella.

## 2024-08-30 ENCOUNTER — Other Ambulatory Visit (HOSPITAL_COMMUNITY): Payer: Self-pay

## 2024-08-30 ENCOUNTER — Telehealth: Payer: Self-pay

## 2024-08-30 DIAGNOSIS — Z5181 Encounter for therapeutic drug level monitoring: Secondary | ICD-10-CM

## 2024-08-30 DIAGNOSIS — J479 Bronchiectasis, uncomplicated: Secondary | ICD-10-CM

## 2024-08-30 NOTE — Telephone Encounter (Signed)
 Received notification from [facility] regarding a prior authorization for BRINSUPRI. Authorization has been APPROVED from 08/30/24 to 08/30/25. Approval letter sent to scan center.  Per test claim, copay for 30 days supply is $0  Authorization # [id number] Phone # [phone]

## 2024-08-30 NOTE — Telephone Encounter (Signed)
 Submitted a Prior Authorization request to [facility] for BRINSUPRI via CoverMyMeds. Will update once we receive a response.  Key: [id number]

## 2024-08-30 NOTE — Telephone Encounter (Signed)
 Will initiate biv for Brinsupri under a separate encounter

## 2024-08-31 ENCOUNTER — Telehealth: Payer: Self-pay | Admitting: *Deleted

## 2024-08-31 NOTE — Telephone Encounter (Signed)
 Patient is calling back concerning the medication that she wanted to speak with her other doctor about . Patient says no one has called her back or anything and the medication has been approved . Patient is frustrated and dont know what's going on and wants to speak with someone . On the line with cal now

## 2024-08-31 NOTE — Telephone Encounter (Unsigned)
 Copied from CRM [id number]. Topic: Clinical - Medication Prior Auth >> Aug 30, 2024  2:43 PM Joesph PARAS wrote: Reason for CRM: BRINSUPRI 10mg  tablets Approved to 08/30/2025 States will be faxing an approval letter, provided fax number.  Patient requesting to have a nurse call and tell her where this medication is going to be sent in terms of pharmacy.

## 2024-09-01 MED ORDER — BRINSUPRI 10 MG PO TABS: 10.0000 mg | ORAL_TABLET | Freq: Every day | ORAL | 0 refills | Status: DC

## 2024-09-01 NOTE — Telephone Encounter (Signed)
 Patient counseled on purpose, proper use, and potential adverse effects including dermatologic reactions and gingival and periodontal reactions, headache, and hypertennion. Brinsupri dosing will be: Take 10mg  tab once daily with or without food. Reviewed missed dose instructions. No drug interactions identified with Brinsupri.   LFTs slightly elevated 08/25/24 (AST 51, Alk phos 140), previous LFTs 03/03/24 wnl. Will monitor LFT's in 4 weeks.  Plan:  - Discussed prior LFT elevation with Dr. Annella via staff message. Start Brinsupri then recheck LFTs in 1 month, then every 3 months thereafter.  - Patient to monitor for changes in skin, such as development of rash, and for changes in gingival/periodontal health. Will alert medical team of any changes or dermatologic concerns. - Start Brinsupri 10mg  daily  - Prescription sent to [facility]. Patient to contact [facility]: [phone] to coordinate shipping.  - Patient will go to lab in 4 weeks at [facility] in [place] for LFTs - Provided pharmacy team contact information if she has any questions.   Aleck Puls, PharmD, BCPS Clinical Pharmacist  [facility]

## 2024-09-05 NOTE — Telephone Encounter (Addendum)
 Received fax notification from [facility] that patient's shipment for Brinsupri has been scheduled to arrive on 9/24.

## 2024-09-22 ENCOUNTER — Other Ambulatory Visit: Payer: Self-pay

## 2024-09-22 DIAGNOSIS — Z5181 Encounter for therapeutic drug level monitoring: Secondary | ICD-10-CM

## 2024-10-05 ENCOUNTER — Telehealth: Payer: Self-pay

## 2024-10-05 DIAGNOSIS — Z5181 Encounter for therapeutic drug level monitoring: Secondary | ICD-10-CM

## 2024-10-05 NOTE — Telephone Encounter (Signed)
 Patient went to [facility] and was told she needs a standing order. She reports [facility] was able to see order that was already signed and released on 09/22/24, but requests future standing order.   See Brinsupri Specialty Med Biv telephone encounter 08/30/24. Plan to get LFTs in 1 month after starting therapy (order entered, viewed by [facility]) and 3 months thereafter due to previous LFT elevation in September.   Will fax standing LFT orders to [facility] for q 3 month labs x 6 months.  Aleck Puls, PharmD, BCPS, CPP Clinical Pharmacist  [facility]

## 2024-10-05 NOTE — Telephone Encounter (Signed)
 Copied from CRM [id number]. Topic: Clinical - Request for Lab/Test Order >> Oct 05, 2024 10:53 AM Leila BROCKS wrote: Reason for CRM: Patient [phone] wants to speak with RPH, Aleck Puls, patient does not want to disclose any information it's regarding an email. Per CAL, RPH Puls is with patient and will call back. Patient states please leave a phone number for patient to reach [facility], Aleck Puls if patient does not pick, it's important for her to know where and what labs orders. Please advise and call back.     ----------------------------------------------------------------------- From previous Reason for Contact - Call Back - No Documentation: Reason for CRM:  Please advise

## 2024-10-07 LAB — HEPATIC FUNCTION PANEL
ALT: 63 IU/L — ABNORMAL HIGH (ref 0–32)
AST: 79 IU/L — ABNORMAL HIGH (ref 0–40)
Albumin: 3.5 g/dL — ABNORMAL LOW (ref 3.8–4.8)
Alkaline Phosphatase: 205 IU/L — ABNORMAL HIGH (ref 49–135)
Bilirubin Total: 0.5 mg/dL (ref 0.0–1.2)
Bilirubin, Direct: 0.22 mg/dL (ref 0.00–0.40)
Total Protein: 7 g/dL (ref 6.0–8.5)

## 2024-10-09 NOTE — Telephone Encounter (Signed)
 LFT trends show some elevation started before she started taking Brinsupri. Brinsupri was dispensed by pharmacy on 09/04/24.    05/30/24 08/25/24 09/07/24 10/06/24  Total bilirubin 0.5 0.4 0.6 0.5  Alk phos 120 140 166 205  AST 35 51 63 79  ALT 32 42 52 63   Patient called me because she saw her LFT results. She denies alcohol  or Tylenol use and denies new complaints or acute symptoms.  Before starting Brinsupri, she was taking Tylenol extra strength, 1 or 2 tablets per day. She stopped taking Tylenol altogether when her PCP told her she had mildly elevated LFTs in September.   LFT elevation was observed uncommonly in Brinsupri studies (N=1 in the 25mg  group).   She denies other medication changes during the timeline listed above. Her antimicrobial regimen is stable - CBC, CMP monitored q3 months at [facility]. Next labs would be due in December with [facility].   Should we give Brinsupri more time and repeat LFTs in 4 weeks, or do you think it's best to hold Brinsupri to see if we can rule it out as a contributor?

## 2024-10-12 NOTE — Addendum Note (Signed)
 Addended by: Delainy Mcelhiney L on: 10/12/2024 01:58 PM   Modules accepted: Orders

## 2024-10-13 ENCOUNTER — Other Ambulatory Visit: Payer: Self-pay | Admitting: Medical Genetics

## 2024-10-13 DIAGNOSIS — Z006 Encounter for examination for normal comparison and control in clinical research program: Secondary | ICD-10-CM

## 2024-10-13 NOTE — Telephone Encounter (Signed)
 Discussed lab results in separate thread with Dr. Annella - continue Brinsupri for now.

## 2024-10-13 NOTE — Telephone Encounter (Signed)
 Discussed lab results with patient. Continue with Brinsupri for now. Repeat LFTs in 1 month at [facility]. Patient is concerned about increasing LFTs over the last several months. She is on several antimicrobials and monitored at [facility]. [facility] provider and PCP. Patient will call their office to discuss her concerns.

## 2024-10-17 NOTE — Telephone Encounter (Signed)
 Patient LVM requesting call regarding medication need.   She asks if her Brinsupri copay will change in the new year and if her insurance will need a new PA. We discussed that her current prior authorization with current insurance is good through Sept 2026. Copay will likely change in the new year due to max out of pocket cost $2100 must be reached before copays return to $0. She verbalizes understanding.   She also reports she discussed elevated LFTs with [facility] ID provider and with PCP. Per PCP, she is holding her low dose statin this month. Repeat LFT orders entered for [facility] for end of November.   She denies further questions/concerns at this time.

## 2024-11-03 ENCOUNTER — Other Ambulatory Visit: Payer: Self-pay

## 2024-11-03 DIAGNOSIS — Z5181 Encounter for therapeutic drug level monitoring: Secondary | ICD-10-CM

## 2024-11-06 ENCOUNTER — Other Ambulatory Visit: Payer: Self-pay | Admitting: Pulmonary Disease

## 2024-11-06 DIAGNOSIS — J479 Bronchiectasis, uncomplicated: Secondary | ICD-10-CM

## 2024-11-07 NOTE — Telephone Encounter (Signed)
 Refill sent for BRINSUPRI to [facility]  Dose: 10mg  by mouth once daily   Last OV: 08/24/24 Provider: Dr. Annella  Pertinent labs: LFTs 10/06/24 slightly elevated but monitored, elevation pre-existing start of Brinsupri therapy - sent fax of most recent LFTs to PCP and to [facility] ID clinic. See 08/30/24 Specialty Med Biv thread - patient disucssed elevated LFTs with PCP and [facility] ID provider. Plan to repeat LFTs late November, orders entered.  Next OV: 11/23/24  Aleck Puls, PharmD, BCPS Clinical Pharmacist  [place] [facility]

## 2024-11-08 ENCOUNTER — Other Ambulatory Visit: Payer: Self-pay

## 2024-11-08 DIAGNOSIS — Z5181 Encounter for therapeutic drug level monitoring: Secondary | ICD-10-CM

## 2024-11-13 ENCOUNTER — Telehealth: Payer: Self-pay | Admitting: Pharmacist

## 2024-11-13 NOTE — Telephone Encounter (Signed)
 2 bottles of clofazimine  are located in the pharmacy office when patient needs a refill.  Skyylar Kopf L. Mohammed Mcandrew, PharmD, BCIDP, AAHIVP, CPP Infectious Diseases Clinical Pharmacist Practitioner Clinical Pharmacist Lead, Specialty Pharmacy [facility]

## 2024-11-14 ENCOUNTER — Ambulatory Visit: Payer: Self-pay

## 2024-11-14 LAB — HEPATIC FUNCTION PANEL
ALT: 68 IU/L — ABNORMAL HIGH (ref 0–32)
AST: 80 IU/L — ABNORMAL HIGH (ref 0–40)
Albumin: 3.7 g/dL — ABNORMAL LOW (ref 3.8–4.8)
Alkaline Phosphatase: 173 IU/L — ABNORMAL HIGH (ref 49–135)
Bilirubin Total: 0.4 mg/dL (ref 0.0–1.2)
Bilirubin, Direct: 0.16 mg/dL (ref 0.00–0.40)
Total Protein: 6.8 g/dL (ref 6.0–8.5)

## 2024-11-15 LAB — GENECONNECT MOLECULAR SCREEN: Genetic Analysis Overall Interpretation: NEGATIVE

## 2024-11-21 ENCOUNTER — Telehealth: Payer: Self-pay

## 2024-11-21 NOTE — Telephone Encounter (Signed)
 Contacted patient to follow up on elevated LFTs - since our last contact on 11/14/24 when we reviewed lab results, she planned to discuss elevated LFTs with her [facility] ID provider and PCP. Pharmacy team faxed lab results to ID and PCP office as FYI on 11/14/24.  Patient reports she discussed with ID provider and there were no recommendations to hold or change medications prescribed by ID. Next OV with ID at [facility] on 11/28/24. However, [facility] provider recommended monthly LFT monitoring. She has not discussed recent labs with PCP.  She denies use of alcohol . She denies yellowing of eyes. Difficult to assess for changes in urine color as she reports her antimicrobial regimen changes color of urine.   LFT elevation appears to pre-exist the start of Brinsupri therapy, although LFTs increased further after starting Brinsupri, and most recent LFTs remain elevated but stable from previous. Given unclear cause of LFT elevation, advised to hold Brinsupri until she sees Dr. Annella to discuss on 11/23/24.  She verbalizes understanding and agreement with plan.

## 2024-11-23 ENCOUNTER — Encounter: Payer: Self-pay | Admitting: Pulmonary Disease

## 2024-11-23 ENCOUNTER — Telehealth: Payer: Self-pay

## 2024-11-23 ENCOUNTER — Ambulatory Visit: Admitting: Pulmonary Disease

## 2024-11-23 VITALS — BP 130/73 | HR 99 | Ht 63.0 in | Wt 98.0 lb

## 2024-11-23 DIAGNOSIS — A310 Pulmonary mycobacterial infection: Secondary | ICD-10-CM

## 2024-11-23 DIAGNOSIS — J45909 Unspecified asthma, uncomplicated: Secondary | ICD-10-CM

## 2024-11-23 DIAGNOSIS — Z87891 Personal history of nicotine dependence: Secondary | ICD-10-CM | POA: Diagnosis not present

## 2024-11-23 DIAGNOSIS — R7989 Other specified abnormal findings of blood chemistry: Secondary | ICD-10-CM | POA: Diagnosis not present

## 2024-11-23 DIAGNOSIS — J479 Bronchiectasis, uncomplicated: Secondary | ICD-10-CM | POA: Diagnosis not present

## 2024-11-23 NOTE — Progress Notes (Signed)
 Synopsis: Referred in 2010 for bronchiectasis by Kiara Vicenta BRAVO, MD.  Previously patient of Dr. Chip and Dr. Alaine and Dr. Gretta.  Subjective:   PATIENT ID: Kiara Medina GENDER: female DOB: May 08, 1946, MRN: [id number]  Chief Complaint  Patient presents with   Follow-up    3 month follow up   Kiara Medina is a 78 y.o. woman with a history of MDR MAC and bronchiectasis who returns for routine follow-up.  Most recent infectious disease note from [facility] reviewed.  Overall stable.  No exacerbations in the interim.  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  Clofazimine , omadacycline, ethambutol , rifampin .  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   Sinusitis      Family History  Problem Relation Age of Onset   Emphysema Sister    Stroke Mother    Clotting disorder Mother      Past Surgical History:  Procedure Laterality Date   APPENDECTOMY     CATARACT EXTRACTION  08/2013   CHOLECYSTECTOMY     TUBAL LIGATION     VESICOVAGINAL FISTULA CLOSURE W/ TAH      Social History   Socioeconomic History   Marital status: Widowed    Spouse name: Not on file   Number of children: Not on file   Years of education: Not on file   Highest education level: Not on file  Occupational History   Occupation: realtor  Tobacco Use   Smoking status: Former    Current packs/day: 0.00    Average packs/day: 1 pack/day for 20.0 years (20.0 ttl pk-yrs)    Types: Cigarettes    Start date: 12/14/1965    Quit date: 12/14/1985    Years since quitting: 38.9   Smokeless tobacco: Never  Vaping Use   Vaping status: Never Used  Substance and Sexual Activity   Alcohol  use: Not Currently    Alcohol /week: 1.0 standard drink of alcohol     Types: 1 Glasses of wine per week    Comment: occ   Drug use: No   Sexual activity: Not on file  Other Topics  Concern   Not on file  Social History Narrative   Not on file   Social Drivers of Health   Tobacco Use: Medium Risk (11/23/2024)   Patient History    Smoking Tobacco Use: Former    Smokeless Tobacco Use: Never    Passive Exposure: Not on Actuary Strain: Not on file  Food Insecurity: Not on file  Transportation Needs: Not on file  Physical Activity: Not on file  Stress: Not on file  Social Connections: Not on file  Intimate Partner Violence: Not on file  Depression (PHQ2-9): Low Risk (09/06/2023)   Depression (PHQ2-9)    PHQ-2 Score: 0  Alcohol  Screen: Not on file  Housing: Not on file  Utilities: Not on file  Health Literacy: Not on file     No Known Allergies   Immunization History  Administered Date(s) Administered   Fluad Quad(high Dose 65+) 09/24/2020   Fluad Trivalent(High Dose 65+) 10/04/2024   INFLUENZA, HIGH DOSE SEASONAL PF 10/10/2015, 09/02/2021   Influenza Split 09/13/2012, 09/06/2014   Influenza Whole 09/09/2009, 08/14/2010, 09/14/2011   Influenza,inj,Quad PF,6+ Mos 09/13/2013, 09/21/2016, 08/26/2017, 08/22/2018   Influenza-Unspecified 08/29/2019, 09/21/2022   Moderna Sars-Covid-2 Vaccination 01/03/2020, 01/31/2020, 08/02/2020, 05/06/2021   PNEUMOCOCCAL CONJUGATE-20 08/12/2023  Pfizer Covid-19 Vaccine Bivalent Booster 5y-11y 11/24/2021   Pneumococcal Conjugate-13 11/24/2014   Pneumococcal Polysaccharide-23 07/17/2009   RSV,unspecified 10/30/2022    Outpatient Medications Prior to Visit  Medication Sig Dispense Refill   acyclovir  (ZOVIRAX ) 400 MG tablet Take 400 mg by mouth 2 (two) times daily.     albuterol  (VENTOLIN  HFA) 108 (90 Base) MCG/ACT inhaler Inhale 2 puffs into the lungs every 6 (six) hours as needed for wheezing or shortness of breath. 18 g 9   alendronate (FOSAMAX) 70 MG tablet Take 70 mg by mouth once a week.     B Complex Vitamins (B COMPLEX-B12 PO) Take 1 tablet by mouth daily.     benzonatate  (TESSALON ) 200 MG capsule  Take 1 capsule (200 mg total) by mouth 3 (three) times daily as needed for cough. 90 capsule 3   Brensocatib (BRINSUPRI) 10 MG TABS Take 1 tablet by mouth 1 time daily with or without food for bronchiectasis. 30 tablet 3   budesonide-glycopyrrolate-formoterol  (BREZTRI  AEROSPHERE) 160-9-4.8 MCG/ACT AERO inhaler Inhale 2 puffs into the lungs in the morning and at bedtime. 1 each 12   calcium  carbonate (OSCAL) 1500 (600 Ca) MG TABS tablet Take 600 mg of elemental calcium  by mouth 2 (two) times daily with a meal.     Cholecalciferol  (VITAMIN D3) 125 MCG (5000 UT) TABS Take 1 tablet by mouth daily.     CLOFAZIMINE  PO Take 100 mg by mouth daily.     ethambutol  (MYAMBUTOL ) 400 MG tablet Take 2 tablets (800 mg total) by mouth daily. 180 tablet 11   Flaxseed, Linseed, (FLAX SEED OIL) 1300 MG CAPS Take 1 tablet by mouth daily.     ipratropium (ATROVENT) 0.06 % nasal spray Place 2 sprays into both nostrils 4 (four) times daily.     Melatonin 10 MG CAPS Take 10 mg by mouth at bedtime.     mirtazapine (REMERON) 15 MG tablet Take by mouth.     mometasone  (NASONEX ) 50 MCG/ACT nasal spray Place 2 sprays into the nose 2 (two) times daily. 17 each 12   NUZYRA 150 MG TABS Take 150 mg by mouth 2 (two) times daily.     omeprazole  (PRILOSEC) 40 MG capsule TAKE 1 CAPSULE DAILY 90 capsule 3   Probiotic Product (PROBIOTIC DAILY PO) Take 1 capsule by mouth daily.     Respiratory Therapy Supplies (FLUTTER) DEVI Use as directed. 1 each 0   rifabutin (MYCOBUTIN) 150 MG capsule Take 300 mg by mouth 2 (two) times daily.     sodium chloride  HYPERTONIC 3 % nebulizer solution Take 4 mLs by nebulization as needed.     Spacer/Aero-Holding Chambers (AEROCHAMBER MV) inhaler Use as instructed 1 each 2   TURMERIC PO Take 2,000 mg by mouth daily.      acetaminophen (TYLENOL) 500 MG tablet Take 1,000 mg by mouth every 6 (six) hours as needed for moderate pain.     atorvastatin  (LIPITOR) 20 MG tablet Take 20 mg by mouth daily.     No  facility-administered medications prior to visit.    Review of Systems  N/a   Objective:   Vitals:   11/23/24 1418  BP: 130/73  Pulse: 99  TempSrc: Oral  SpO2: 98%  Weight: 98 lb (44.5 kg)  Height: 5' 3 (1.6 m)       98% on   RA BMI Readings from Last 3 Encounters:  11/23/24 17.36 kg/m  08/24/24 16.65 kg/m  05/17/24 16.86 kg/m   Wt Readings from Last 3  Encounters:  11/23/24 98 lb (44.5 kg)  08/24/24 94 lb (42.6 kg)  05/17/24 95 lb 3.2 oz (43.2 kg)    Physical Exam BP 130/73   Pulse 99   Ht 5' 3 (1.6 m)   Wt 98 lb (44.5 kg)   SpO2 98%   BMI 17.36 kg/m  General: Well-appearing in no acute distress, thin Eyes: EOMI, icterus Respiratory: Clear, no wheeze, normal work of breathing Cardiovascular: Regular rhythm, no murmur Extremities: Warm, no edema     CBC    Component Value Date/Time   WBC 10.3 06/02/2023 0322   RBC 4.61 06/02/2023 0322   HGB 13.1 06/02/2023 0322   HCT 40.7 06/02/2023 0322   PLT 272 06/02/2023 0322   MCV 88.3 06/02/2023 0322   MCH 28.4 06/02/2023 0322   MCHC 32.2 06/02/2023 0322   RDW 14.6 06/02/2023 0322   LYMPHSABS 999 06/02/2023 0322   MONOABS 0.5 09/24/2021 0519   EOSABS 31 06/02/2023 0322   BASOSABS 31 06/02/2023 0322    CHEMISTRY No results for input(s): NA, K, CL, CO2, GLUCOSE, BUN, CREATININE, CALCIUM , MG, PHOS in the last 168 hours. CrCl cannot be calculated (Patient's most recent lab result is older than the maximum 21 days allowed.).  12/28/2019: IgG 954 IgM 96 IgA 289 IgE 10 IgG subclasses normal (other than increased IgG subclass 3)  Cultures: 03/2024 smear + MAI 02/20/21 - MAI 11/23/2019-MAI 05/17/2019-MAI 03/15/2019-MAI 01/10/2019 MAI 11/07/2018-MAI 10/10/2018-MAI  08/22/2018-MAI 07/11/2018-MAI 05/31/2018-MAI MAI susceptibilities-resistant clarithromycin, linezolid.  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  Susceptible to amikacin , cefepime, clarithromycin, imipenem, linezolid, trimethoprim sulfamethoxazole. 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  Tree-in-bud opacities diffusely.  Mucus impaction in airways.  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     Assessment & Plan:     ICD-10-CM   1. BRONCHIECTASIS  J47.9     2. Abnormal LFTs  R79.89        Asthma: Cough less productive.  More like bronchospasm.  Must be cautious with ICS in setting of  MDR NTM. 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  Progressive on CT over the last several years, particular right upper lobe cavitary lesion.  No evidence of hypersensitivity pneumonitis from inhaled amikacin  on CT scan.  Amikacin  stopped due to worsening of cough, subjective cough.  Unfortunate she has had progressive disease over time.  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  Suspect much of what is left is just palliation.  Reasonable to assess for second opinion and possible clinical trials etc. - Hypertonic saline twice a day  -- Continue vest therapy - Continue current multi drug MAI  therapy and ID follow-up via [facility]  -- Continue Brinsupri  Elevated LFTs: Laterally described with Brinsupri.  Was taken off Tylenol.  Statins been stopped.  LFTs have decreased but still mildly elevated.  Will continue multivitamin, if improves or stabilizes decrease to every other month in the future.  Repeat labs 12/2024.  Requiring intensive drug monitoring.   RTC in 3 months with Dr. Annella.     Current Outpatient Medications:    acyclovir  (ZOVIRAX ) 400 MG tablet, Take 400 mg by mouth 2 (two) times daily., Disp: , Rfl:    albuterol  (VENTOLIN  HFA) 108 (90 Base) MCG/ACT inhaler, Inhale 2 puffs into the lungs every 6 (six) hours as needed for wheezing or shortness of breath., Disp: 18 g, Rfl: 9   alendronate (FOSAMAX) 70 MG tablet, Take 70 mg by mouth once a week., Disp: , Rfl:    B Complex Vitamins (B COMPLEX-B12 PO), Take 1 tablet by mouth daily., Disp: , Rfl:    benzonatate  (TESSALON ) 200 MG capsule, Take 1 capsule (200 mg total) by mouth 3 (three) times daily as needed for cough., Disp: 90 capsule, Rfl: 3   Brensocatib (BRINSUPRI) 10 MG TABS, Take 1 tablet by mouth 1 time daily with or without food for bronchiectasis., Disp: 30 tablet, Rfl: 3   budesonide-glycopyrrolate-formoterol  (BREZTRI  AEROSPHERE) 160-9-4.8 MCG/ACT AERO inhaler, Inhale 2 puffs into the lungs in the morning and at bedtime., Disp: 1 each, Rfl: 12   calcium  carbonate (OSCAL) 1500 (600 Ca) MG TABS tablet, Take 600 mg of elemental calcium  by mouth 2 (two) times daily with a meal., Disp: , Rfl:    Cholecalciferol  (VITAMIN D3) 125 MCG (5000 UT) TABS, Take 1 tablet by mouth daily., Disp: , Rfl:    CLOFAZIMINE  PO, Take 100 mg by mouth daily., Disp: , Rfl:    ethambutol  (MYAMBUTOL ) 400 MG tablet, Take 2 tablets (800 mg total) by mouth daily., Disp: 180 tablet, Rfl: 11   Flaxseed, Linseed, (FLAX SEED OIL) 1300 MG CAPS, Take 1 tablet by mouth daily., Disp: , Rfl:    ipratropium (ATROVENT) 0.06 % nasal spray, Place 2 sprays into  both nostrils 4 (four) times daily., Disp: , Rfl:    Melatonin 10 MG CAPS, Take 10 mg by mouth at bedtime., Disp: , Rfl:    mirtazapine (REMERON) 15 MG tablet, Take by mouth., Disp: , Rfl:    mometasone  (NASONEX ) 50 MCG/ACT nasal spray, Place 2 sprays into the nose 2 (two) times daily., Disp: 17 each, Rfl: 12   NUZYRA 150 MG TABS, Take 150 mg by mouth 2 (two) times daily., Disp: , Rfl:    omeprazole  (PRILOSEC) 40 MG capsule, TAKE 1 CAPSULE DAILY, Disp: 90 capsule, Rfl: 3   Probiotic Product (PROBIOTIC DAILY PO), Take 1 capsule by mouth daily., Disp: , Rfl:    Respiratory Therapy Supplies (FLUTTER) DEVI, Use as directed., Disp: 1 each, Rfl: 0   rifabutin (MYCOBUTIN) 150 MG capsule, Take 300 mg by mouth 2 (two) times daily., Disp: , Rfl:    sodium chloride  HYPERTONIC 3 % nebulizer solution, Take 4 mLs by nebulization as needed., Disp: , Rfl:    Spacer/Aero-Holding Chambers (AEROCHAMBER MV) inhaler, Use as instructed, Disp: 1 each, Rfl: 2   TURMERIC PO, Take 2,000 mg by mouth daily. , Disp: , Rfl:

## 2024-11-23 NOTE — Patient Instructions (Signed)
 Neck to see you again  Glad you are doing well  No changes to the medication, continue Brinsupri for now  Will repeat LFTs, liver function test, first week of January, if stabilized or improving we will back off the frequency  Return to clinic in 3 months or sooner as needed with Dr. Annella

## 2024-11-23 NOTE — Telephone Encounter (Signed)
 Patient came and picked up 3 month supply of her Clofazamine.

## 2024-11-27 ENCOUNTER — Telehealth: Payer: Self-pay | Admitting: Pharmacist

## 2024-11-27 ENCOUNTER — Ambulatory Visit: Admitting: Infectious Disease

## 2024-11-27 NOTE — Telephone Encounter (Signed)
 Patient picked up 2 bottles of clofazimine  on 11/23/24. Supply should last for ~3 months. Next refill due around the end of March.  Kiara Medina, PharmD, BCIDP, AAHIVP, CPP Clinical Pharmacist Practitioner Infectious Diseases Clinical Pharmacist [facility] 11/27/2024, 9:09 AM

## 2024-12-11 ENCOUNTER — Telehealth: Payer: Self-pay

## 2024-12-11 NOTE — Telephone Encounter (Signed)
 Patient Kiara Medina requesting assistance scheduling LFTs for [facility], and because she is running out of Brinsupri .   Contacted patient to discuss - she is not running out of Brinsupri  and received her next shipment in the mail this past weekend. She asks about scheduling LFTs but she is getting blood work with her PCP on 12/20/23. Unclear what labs are ordered as this is outside [facility]. She will ask when she gets blood work and let us  know if they do not get LFTs.   NFN at this time.

## 2024-12-15 ENCOUNTER — Other Ambulatory Visit: Payer: Self-pay | Admitting: Pulmonary Disease

## 2024-12-15 NOTE — Telephone Encounter (Signed)
 Copied from CRM #[id number]. Topic: Clinical - Medication Refill >> Dec 15, 2024 10:23 AM Rozanna MATSU wrote: Medication: omeprazole  (PRILOSEC) 40 MG capsule   Has the patient contacted their pharmacy? No (Agent: If no, request that the patient contact the pharmacy for the refill. If patient does not wish to contact the pharmacy document the reason why and proceed with request.) (Agent: If yes, when and what did the pharmacy advise?)  This is the patient's preferred pharmacy:  [email or web address] - [facility] - [place], [place] - [address] [place] [address] Phone: [phone] Fax: [phone]  Is this the correct pharmacy for this prescription? Yes If no, delete pharmacy and type the correct one.   Has the prescription been filled recently? Yes  Is the patient out of the medication? No  Has the patient been seen for an appointment in the last year OR does the patient have an upcoming appointment? Yes  Can we respond through MyChart? Yes  Agent: Please be advised that Rx refills may take up to 3 business days. We ask that you follow-up with your pharmacy.

## 2024-12-18 MED ORDER — OMEPRAZOLE 40 MG PO CPDR: 40.0000 mg | DELAYED_RELEASE_CAPSULE | Freq: Every day | ORAL | 2 refills | Status: DC

## 2024-12-19 ENCOUNTER — Other Ambulatory Visit: Payer: Self-pay

## 2024-12-19 ENCOUNTER — Telehealth: Payer: Self-pay

## 2024-12-19 MED ORDER — OMEPRAZOLE 40 MG PO CPDR: 40.0000 mg | DELAYED_RELEASE_CAPSULE | Freq: Every day | ORAL | 2 refills | Status: AC

## 2024-12-19 NOTE — Telephone Encounter (Signed)
 Patient is calling to speak with kelly say she just received a call from kelly . Patient sounds frustrated . Please give patient a call back concerning some medication . Tried calling [place] no response . Please give her a call back [phone]

## 2024-12-19 NOTE — Telephone Encounter (Signed)
 Spoke with patient New Rx sent to [facility] -    Copied from CRM [id number]. Topic: Clinical - Prescription Issue >> Dec 19, 2024  7:59 AM Kiara Medina wrote: Reason for CRM: Patient states she advised clinic two times to send her omeprazole  (PRILOSEC) 40 MG capsule to her new pharmacy [email or web address] - [facility] - [place], [place] - [address] [place] [address] Phone: [phone] Fax: [phone]  Patient states [facility] which is wrong and is requesting Dr. Annella to fix this and cancel [facility] as she cannot [facility] it up there.

## 2024-12-22 NOTE — Telephone Encounter (Signed)
Routing to Kelly.  ?

## 2024-12-22 NOTE — Telephone Encounter (Signed)
 Address see phone note 12/19/24    -NFN

## 2024-12-25 NOTE — Progress Notes (Signed)
 [facility] Refill Coordination Note    Specialty Medication(s) to be Shipped:   CF/Pulmonary/Asthma: Nuzyra     Other medication(s) to be shipped: No additional medications requested for fill at this time    Specialty Medications not needed at this time: N/A     Kiara Medina, DOB: 1946-03-27  Phone: There are no phone numbers on file.      All above HIPAA information was verified with patient.     Was a nurse, learning disability used for this call? No    Completed refill call assessment today to schedule patient's medication shipment from the [facility]  [phone]).  All relevant notes have been reviewed.     Specialty medication(s) and dose(s) confirmed: Regimen is correct and unchanged.   Changes to medications: Sheketa reports no changes at this time.  Changes to insurance: No  New side effects reported not previously addressed with a pharmacist or physician: None reported  Questions for the pharmacist: No    Confirmed patient received a Conservation Officer, Historic Buildings and a Surveyor, Mining with first shipment. The patient will receive a drug information handout for each medication shipped and additional FDA Medication Guides as required.       DISEASE/MEDICATION-SPECIFIC INFORMATION        N/A    SPECIALTY MEDICATION ADHERENCE     Medication Adherence    Patient reported X missed doses in the last month: 0  Specialty Medication: Nuzyra  150mg  daily  Patient is on additional specialty medications: No  Patient is on more than two specialty medications: No  Any gaps in refill history greater than 2 weeks in the last 3 months: no  Demonstrates understanding of importance of adherence: yes  Informant: patient            Were doses missed due to medication being on hold? No    Nuzyra  150 mg: 9 days of medicine on hand      Specialty medication is an injection or given on a cycle: No    REFERRAL TO PHARMACIST     Referral to the pharmacist: Not needed      [facility] Shipping address confirmed in Epic.     Cost and Payment: Patient has a copay of [id number]. They are aware and have authorized the pharmacy to charge the credit card on file. (If financial assistance is not available)    Delivery Scheduled: Yes, Expected medication delivery date: 01/04/24.     Medication will be delivered via UPS to the prescription address in Epic WAM.    Shelba DELENA Hummer, PharmD   [facility]  Specialty Pharmacist

## 2025-01-03 ENCOUNTER — Emergency Department (HOSPITAL_COMMUNITY)

## 2025-01-03 ENCOUNTER — Other Ambulatory Visit: Payer: Self-pay

## 2025-01-03 ENCOUNTER — Inpatient Hospital Stay (HOSPITAL_COMMUNITY)
Admission: EM | Admit: 2025-01-03 | Discharge: 2025-01-10 | DRG: 202 | Disposition: A | Discharge status: Home / Self Care | Attending: Internal Medicine | Admitting: Internal Medicine

## 2025-01-03 ENCOUNTER — Inpatient Hospital Stay (HOSPITAL_COMMUNITY)

## 2025-01-03 ENCOUNTER — Encounter (HOSPITAL_COMMUNITY): Payer: Self-pay

## 2025-01-03 DIAGNOSIS — E875 Hyperkalemia: Secondary | ICD-10-CM | POA: Diagnosis present

## 2025-01-03 DIAGNOSIS — Z1152 Encounter for screening for COVID-19: Secondary | ICD-10-CM

## 2025-01-03 DIAGNOSIS — Z7983 Long term (current) use of bisphosphonates: Secondary | ICD-10-CM

## 2025-01-03 DIAGNOSIS — J471 Bronchiectasis with (acute) exacerbation: Secondary | ICD-10-CM | POA: Diagnosis present

## 2025-01-03 DIAGNOSIS — Z823 Family history of stroke: Secondary | ICD-10-CM

## 2025-01-03 DIAGNOSIS — E871 Hypo-osmolality and hyponatremia: Secondary | ICD-10-CM | POA: Diagnosis present

## 2025-01-03 DIAGNOSIS — Z832 Family history of diseases of the blood and blood-forming organs and certain disorders involving the immune mechanism: Secondary | ICD-10-CM

## 2025-01-03 DIAGNOSIS — J939 Pneumothorax, unspecified: Secondary | ICD-10-CM

## 2025-01-03 DIAGNOSIS — E785 Hyperlipidemia, unspecified: Secondary | ICD-10-CM | POA: Diagnosis present

## 2025-01-03 DIAGNOSIS — Z8619 Personal history of other infectious and parasitic diseases: Secondary | ICD-10-CM

## 2025-01-03 DIAGNOSIS — Z825 Family history of asthma and other chronic lower respiratory diseases: Secondary | ICD-10-CM | POA: Diagnosis not present

## 2025-01-03 DIAGNOSIS — R52 Pain, unspecified: Secondary | ICD-10-CM

## 2025-01-03 DIAGNOSIS — Z9049 Acquired absence of other specified parts of digestive tract: Secondary | ICD-10-CM | POA: Diagnosis not present

## 2025-01-03 DIAGNOSIS — J9312 Secondary spontaneous pneumothorax: Secondary | ICD-10-CM | POA: Diagnosis present

## 2025-01-03 DIAGNOSIS — R748 Abnormal levels of other serum enzymes: Secondary | ICD-10-CM | POA: Diagnosis present

## 2025-01-03 DIAGNOSIS — Z87891 Personal history of nicotine dependence: Secondary | ICD-10-CM

## 2025-01-03 DIAGNOSIS — J470 Bronchiectasis with acute lower respiratory infection: Secondary | ICD-10-CM | POA: Diagnosis present

## 2025-01-03 DIAGNOSIS — F418 Other specified anxiety disorders: Secondary | ICD-10-CM | POA: Diagnosis not present

## 2025-01-03 DIAGNOSIS — J9382 Other air leak: Secondary | ICD-10-CM | POA: Diagnosis not present

## 2025-01-03 DIAGNOSIS — J209 Acute bronchitis, unspecified: Secondary | ICD-10-CM | POA: Diagnosis present

## 2025-01-03 DIAGNOSIS — J9601 Acute respiratory failure with hypoxia: Secondary | ICD-10-CM | POA: Diagnosis present

## 2025-01-03 DIAGNOSIS — Z79899 Other long term (current) drug therapy: Secondary | ICD-10-CM | POA: Diagnosis not present

## 2025-01-03 DIAGNOSIS — J9383 Other pneumothorax: Secondary | ICD-10-CM | POA: Diagnosis present

## 2025-01-03 DIAGNOSIS — Y838 Other surgical procedures as the cause of abnormal reaction of the patient, or of later complication, without mention of misadventure at the time of the procedure: Secondary | ICD-10-CM | POA: Diagnosis not present

## 2025-01-03 DIAGNOSIS — A310 Pulmonary mycobacterial infection: Secondary | ICD-10-CM | POA: Diagnosis present

## 2025-01-03 DIAGNOSIS — F32A Depression, unspecified: Secondary | ICD-10-CM | POA: Diagnosis present

## 2025-01-03 DIAGNOSIS — T85628A Displacement of other specified internal prosthetic devices, implants and grafts, initial encounter: Secondary | ICD-10-CM | POA: Diagnosis not present

## 2025-01-03 DIAGNOSIS — Z7951 Long term (current) use of inhaled steroids: Secondary | ICD-10-CM

## 2025-01-03 DIAGNOSIS — K5900 Constipation, unspecified: Secondary | ICD-10-CM | POA: Diagnosis present

## 2025-01-03 DIAGNOSIS — M810 Age-related osteoporosis without current pathological fracture: Secondary | ICD-10-CM | POA: Diagnosis present

## 2025-01-03 DIAGNOSIS — Z885 Allergy status to narcotic agent status: Secondary | ICD-10-CM

## 2025-01-03 DIAGNOSIS — K219 Gastro-esophageal reflux disease without esophagitis: Secondary | ICD-10-CM | POA: Diagnosis present

## 2025-01-03 DIAGNOSIS — J479 Bronchiectasis, uncomplicated: Secondary | ICD-10-CM | POA: Diagnosis not present

## 2025-01-03 DIAGNOSIS — F419 Anxiety disorder, unspecified: Secondary | ICD-10-CM | POA: Diagnosis not present

## 2025-01-03 LAB — COMPREHENSIVE METABOLIC PANEL WITH GFR
ALT: 30 U/L (ref 0–44)
AST: 44 U/L — ABNORMAL HIGH (ref 15–41)
Albumin: 3.8 g/dL (ref 3.5–5.0)
Alkaline Phosphatase: 134 U/L — ABNORMAL HIGH (ref 38–126)
Anion gap: 12 (ref 5–15)
BUN: 12 mg/dL (ref 8–23)
CO2: 24 mmol/L (ref 22–32)
Calcium: 9.2 mg/dL (ref 8.9–10.3)
Chloride: 100 mmol/L (ref 98–111)
Creatinine, Ser: 0.68 mg/dL (ref 0.44–1.00)
GFR, Estimated: >60 mL/min
Glucose, Bld: 139 mg/dL — ABNORMAL HIGH (ref 70–99)
Potassium: 3.9 mmol/L (ref 3.5–5.1)
Sodium: 135 mmol/L (ref 135–145)
Total Bilirubin: 0.5 mg/dL (ref 0.0–1.2)
Total Protein: 7.4 g/dL (ref 6.5–8.1)

## 2025-01-03 LAB — TROPONIN T, HIGH SENSITIVITY: Troponin T High Sensitivity: 25 ng/L — ABNORMAL HIGH (ref 0–19)

## 2025-01-03 LAB — CBC WITH DIFFERENTIAL/PLATELET
Abs Immature Granulocytes: 0.13 K/uL — ABNORMAL HIGH (ref 0.00–0.07)
Basophils Absolute: 0 K/uL (ref 0.0–0.1)
Basophils Relative: 0 %
Eosinophils Absolute: 0.1 K/uL (ref 0.0–0.5)
Eosinophils Relative: 0 %
HCT: 41 % (ref 36.0–46.0)
Hemoglobin: 13.5 g/dL (ref 12.0–15.0)
Immature Granulocytes: 1 %
Lymphocytes Relative: 11 %
Lymphs Abs: 1.6 K/uL (ref 0.7–4.0)
MCH: 28.8 pg (ref 26.0–34.0)
MCHC: 32.9 g/dL (ref 30.0–36.0)
MCV: 87.6 fL (ref 80.0–100.0)
Monocytes Absolute: 0.9 K/uL (ref 0.1–1.0)
Monocytes Relative: 6 %
Neutro Abs: 12.3 K/uL — ABNORMAL HIGH (ref 1.7–7.7)
Neutrophils Relative %: 82 %
Platelets: 277 K/uL (ref 150–400)
RBC: 4.68 MIL/uL (ref 3.87–5.11)
RDW: 15.8 % — ABNORMAL HIGH (ref 11.5–15.5)
WBC: 15 K/uL — ABNORMAL HIGH (ref 4.0–10.5)
nRBC: 0 % (ref 0.0–0.2)

## 2025-01-03 LAB — RESP PANEL BY RT-PCR (RSV, FLU A&B, COVID)  RVPGX2
Influenza A by PCR: NEGATIVE
Influenza B by PCR: NEGATIVE
Resp Syncytial Virus by PCR: NEGATIVE
SARS Coronavirus 2 by RT PCR: NEGATIVE

## 2025-01-03 LAB — PRO BRAIN NATRIURETIC PEPTIDE: Pro Brain Natriuretic Peptide: 413 pg/mL — ABNORMAL HIGH

## 2025-01-03 MED ORDER — PREDNISONE 20 MG PO TABS
20.0000 mg | ORAL_TABLET | Freq: Every day | ORAL | Status: AC
  Administered 2025-01-07 – 2025-01-09 (×2): 20 mg via ORAL
  Filled 2025-01-03 (×2): qty 1

## 2025-01-03 MED ORDER — RIFABUTIN 150 MG PO CAPS
150.0000 mg | ORAL_CAPSULE | Freq: Two times a day (BID) | ORAL | Status: AC
  Administered 2025-01-03 – 2025-01-09 (×10): 150 mg via ORAL
  Filled 2025-01-03 (×14): qty 1

## 2025-01-03 MED ORDER — ALBUTEROL SULFATE (2.5 MG/3ML) 0.083% IN NEBU
2.5000 mg | INHALATION_SOLUTION | Freq: Four times a day (QID) | RESPIRATORY_TRACT | Status: DC
  Administered 2025-01-03 (×3): 2.5 mg via RESPIRATORY_TRACT
  Filled 2025-01-03 (×3): qty 3

## 2025-01-03 MED ORDER — ESCITALOPRAM OXALATE 10 MG PO TABS
10.0000 mg | ORAL_TABLET | Freq: Every day | ORAL | Status: DC
  Administered 2025-01-04 – 2025-01-09 (×7): 10 mg via ORAL
  Filled 2025-01-03 (×7): qty 1

## 2025-01-03 MED ORDER — IPRATROPIUM-ALBUTEROL 0.5-2.5 (3) MG/3ML IN SOLN
3.0000 mL | RESPIRATORY_TRACT | Status: DC | PRN
  Administered 2025-01-04: 3 mL via RESPIRATORY_TRACT
  Filled 2025-01-03: qty 3

## 2025-01-03 MED ORDER — NON FORMULARY: 150.0000 mg | Freq: Two times a day (BID) | Status: DC

## 2025-01-03 MED ORDER — CALCIUM CARBONATE 1500 (600 CA) MG PO TABS: 600.0000 mg | ORAL_TABLET | Freq: Every day | ORAL | Status: DC

## 2025-01-03 MED ORDER — ALBUTEROL SULFATE (2.5 MG/3ML) 0.083% IN NEBU: 2.5000 mg | INHALATION_SOLUTION | RESPIRATORY_TRACT | Status: DC | PRN

## 2025-01-03 MED ORDER — CALCIUM CARBONATE 1250 (500 CA) MG PO TABS
500.0000 mg | ORAL_TABLET | Freq: Every day | ORAL | Status: DC
  Administered 2025-01-04 – 2025-01-10 (×7): 1250 mg via ORAL
  Filled 2025-01-03 (×8): qty 1

## 2025-01-03 MED ORDER — SODIUM CHLORIDE 3 % IN NEBU
4.0000 mL | INHALATION_SOLUTION | Freq: Four times a day (QID) | RESPIRATORY_TRACT | Status: DC
  Administered 2025-01-03 – 2025-01-04 (×3): 4 mL via RESPIRATORY_TRACT
  Filled 2025-01-03 (×4): qty 4

## 2025-01-03 MED ORDER — SODIUM CHLORIDE 3 % IN NEBU
4.0000 mL | INHALATION_SOLUTION | Freq: Two times a day (BID) | RESPIRATORY_TRACT | Status: DC
  Filled 2025-01-03: qty 4

## 2025-01-03 MED ORDER — ENOXAPARIN SODIUM 40 MG/0.4ML IJ SOSY
40.0000 mg | PREFILLED_SYRINGE | INTRAMUSCULAR | Status: DC
  Administered 2025-01-04 – 2025-01-09 (×6): 40 mg via SUBCUTANEOUS
  Filled 2025-01-03 (×7): qty 0.4

## 2025-01-03 MED ORDER — OMADACYCLINE TOSYLATE 150 MG PO TABS
150.0000 mg | ORAL_TABLET | Freq: Once | ORAL | Status: DC
  Filled 2025-01-03 (×3): qty 1

## 2025-01-03 MED ORDER — NON FORMULARY
100.0000 mg | Freq: Two times a day (BID) | Status: DC
  Administered 2025-01-04: 100 mg via ORAL

## 2025-01-03 MED ORDER — KETOROLAC TROMETHAMINE 15 MG/ML IJ SOLN
15.0000 mg | INTRAMUSCULAR | Status: AC
  Administered 2025-01-03: 15 mg via INTRAVENOUS
  Filled 2025-01-03: qty 1

## 2025-01-03 MED ORDER — TRAMADOL HCL 50 MG PO TABS
50.0000 mg | ORAL_TABLET | Freq: Four times a day (QID) | ORAL | Status: DC | PRN
  Administered 2025-01-03 – 2025-01-04 (×2): 50 mg via ORAL
  Filled 2025-01-03 (×3): qty 1

## 2025-01-03 MED ORDER — PREDNISONE 20 MG PO TABS
40.0000 mg | ORAL_TABLET | Freq: Every day | ORAL | Status: AC
  Administered 2025-01-04 – 2025-01-06 (×3): 40 mg via ORAL
  Filled 2025-01-03 (×3): qty 2

## 2025-01-03 MED ORDER — ETHAMBUTOL HCL 400 MG PO TABS
400.0000 mg | ORAL_TABLET | Freq: Two times a day (BID) | ORAL | Status: AC
  Administered 2025-01-03 – 2025-01-09 (×10): 400 mg via ORAL
  Filled 2025-01-03 (×14): qty 1

## 2025-01-03 MED ORDER — ATORVASTATIN CALCIUM 40 MG PO TABS
40.0000 mg | ORAL_TABLET | Freq: Every day | ORAL | Status: DC
  Filled 2025-01-03 (×3): qty 1

## 2025-01-03 MED ORDER — METHYLPREDNISOLONE SODIUM SUCC 125 MG IJ SOLR
125.0000 mg | Freq: Once | INTRAMUSCULAR | Status: AC
  Administered 2025-01-03: 125 mg via INTRAVENOUS
  Filled 2025-01-03: qty 2

## 2025-01-03 MED ORDER — IPRATROPIUM-ALBUTEROL 0.5-2.5 (3) MG/3ML IN SOLN
3.0000 mL | RESPIRATORY_TRACT | Status: AC
  Administered 2025-01-03 (×2): 3 mL via RESPIRATORY_TRACT
  Filled 2025-01-03 (×2): qty 3

## 2025-01-03 MED ORDER — VITAMIN D 25 MCG (1000 UNIT) PO TABS
4000.0000 [IU] | ORAL_TABLET | Freq: Every day | ORAL | Status: DC
  Administered 2025-01-03 – 2025-01-10 (×8): 4000 [IU] via ORAL
  Filled 2025-01-03 (×8): qty 4

## 2025-01-03 MED ORDER — LIDOCAINE-EPINEPHRINE (PF) 2 %-1:200000 IJ SOLN: 20.0000 mL | Freq: Once | INTRAMUSCULAR | Status: DC

## 2025-01-03 MED ORDER — BRENSOCATIB 10 MG PO TABS
10.0000 mg | ORAL_TABLET | Freq: Every day | ORAL | Status: DC
  Administered 2025-01-04 – 2025-01-10 (×7): 10 mg via ORAL
  Filled 2025-01-03 (×5): qty 10
  Filled 2025-01-03 (×2): qty 1
  Filled 2025-01-03 (×2): qty 10

## 2025-01-03 MED ORDER — OMADACYCLINE TOSYLATE 150 MG PO TABS
300.0000 mg | ORAL_TABLET | Freq: Every day | ORAL | Status: DC
  Administered 2025-01-04: 300 mg via ORAL
  Filled 2025-01-03 (×2): qty 2

## 2025-01-03 MED ORDER — ACYCLOVIR 200 MG PO CAPS
400.0000 mg | ORAL_CAPSULE | Freq: Two times a day (BID) | ORAL | Status: DC
  Administered 2025-01-03 – 2025-01-10 (×14): 400 mg via ORAL
  Filled 2025-01-03 (×16): qty 2

## 2025-01-03 MED ORDER — MELATONIN 5 MG PO TABS
5.0000 mg | ORAL_TABLET | Freq: Every day | ORAL | Status: DC
  Administered 2025-01-04 – 2025-01-09 (×7): 5 mg via ORAL
  Filled 2025-01-03 (×8): qty 1

## 2025-01-03 MED ORDER — BUDESON-GLYCOPYRROL-FORMOTEROL 160-9-4.8 MCG/ACT IN AERO
2.0000 | INHALATION_SPRAY | Freq: Two times a day (BID) | RESPIRATORY_TRACT | Status: DC
  Administered 2025-01-03 – 2025-01-10 (×14): 2 via RESPIRATORY_TRACT
  Filled 2025-01-03 (×3): qty 5.9

## 2025-01-03 MED ORDER — LEVOFLOXACIN 750 MG PO TABS
750.0000 mg | ORAL_TABLET | Freq: Every day | ORAL | Status: DC
  Administered 2025-01-03 – 2025-01-04 (×2): 750 mg via ORAL
  Filled 2025-01-03 (×3): qty 1

## 2025-01-03 MED ORDER — PANTOPRAZOLE SODIUM 40 MG PO TBEC
40.0000 mg | DELAYED_RELEASE_TABLET | Freq: Every day | ORAL | Status: DC
  Administered 2025-01-03 – 2025-01-10 (×8): 40 mg via ORAL
  Filled 2025-01-03 (×8): qty 1

## 2025-01-03 MED ORDER — BENZONATATE 100 MG PO CAPS
200.0000 mg | ORAL_CAPSULE | Freq: Three times a day (TID) | ORAL | Status: DC | PRN
  Administered 2025-01-04 – 2025-01-07 (×4): 200 mg via ORAL
  Filled 2025-01-03 (×4): qty 2

## 2025-01-03 NOTE — Plan of Care (Signed)
   Problem: Education: Goal: Knowledge of General Education information will improve Description Including pain rating scale, medication(s)/side effects and non-pharmacologic comfort measures Outcome: Progressing

## 2025-01-03 NOTE — ED Notes (Signed)
Floor notified.

## 2025-01-03 NOTE — ED Provider Notes (Signed)
 " [facility] Provider Note   CSN: [id number] Arrival date & time: 01/03/25  9057     Patient presents with: Shortness of Breath   Kiara Medina is a 79 y.o. female.   Patient here shortness of breath cough for the last couple days.  Went to the doctor's Monday for the same has not gotten better.  Started to feel worse overnight.  Oxygen was in the low 90s with EMS.  She is feeling short of breath with exertion.  She does not wear oxygen at home.  She has a history of MAC.  She takes inhalers.  She follows with infectious diseases on ethambutol /nazurya.  History of bronchiectasis.  She denies any chest pain leg swelling recent surgery or travel.  No sick contacts.  No fever.  Nothing is made it better.  The history is provided by the patient.       Prior to Admission medications  Medication Sig Start Date End Date Taking? Authorizing Provider  acyclovir  (ZOVIRAX ) 400 MG tablet Take 400 mg by mouth 2 (two) times daily.    [provider]  albuterol  (VENTOLIN  HFA) 108 (90 Base) MCG/ACT inhaler Inhale 2 puffs into the lungs every 6 (six) hours as needed for wheezing or shortness of breath. 04/13/24   Hunsucker, Donnice JONELLE, MD  alendronate (FOSAMAX) 70 MG tablet Take 70 mg by mouth once a week. 01/25/23   [provider]  B Complex Vitamins (B COMPLEX-B12 PO) Take 1 tablet by mouth daily.    [provider]  benzonatate  (TESSALON ) 200 MG capsule Take 1 capsule (200 mg total) by mouth 3 (three) times daily as needed for cough. 11/02/23   Hunsucker, Donnice JONELLE, MD  Brensocatib  (BRINSUPRI ) 10 MG TABS Take 1 tablet by mouth 1 time daily with or without food for bronchiectasis. 11/07/24   Hunsucker, Donnice JONELLE, MD  budesonide -glycopyrrolate -formoterol  (BREZTRI  AEROSPHERE) 160-9-4.8 MCG/ACT AERO inhaler Inhale 2 puffs into the lungs in the morning and at bedtime. 05/10/24   Hunsucker, Donnice JONELLE, MD  calcium  carbonate (OSCAL) 1500 (600  Ca) MG TABS tablet Take 600 mg of elemental calcium  by mouth 2 (two) times daily with a meal.    [provider]  Cholecalciferol  (VITAMIN D3) 125 MCG (5000 UT) TABS Take 1 tablet by mouth daily.    [provider]  CLOFAZIMINE  PO Take 100 mg by mouth daily.    [provider]  ethambutol  (MYAMBUTOL ) 400 MG tablet Take 2 tablets (800 mg total) by mouth daily. 06/27/24   Fleeta Rothman, Jomarie SAILOR, MD  Flaxseed, Linseed, (FLAX SEED OIL) 1300 MG CAPS Take 1 tablet by mouth daily.    [provider]  ipratropium (ATROVENT) 0.06 % nasal spray Place 2 sprays into both nostrils 4 (four) times daily. 04/03/24   [provider]  Melatonin 10 MG CAPS Take 10 mg by mouth at bedtime.    [provider]  mirtazapine (REMERON) 15 MG tablet Take by mouth. 02/13/24 11/23/24  [provider]  mometasone  (NASONEX ) 50 MCG/ACT nasal spray Place 2 sprays into the nose 2 (two) times daily. 11/08/23   Soldatova, Liuba, MD  NUZYRA  150 MG TABS Take 150 mg by mouth 2 (two) times daily.    [provider]  omeprazole  (PRILOSEC) 40 MG capsule Take 1 capsule (40 mg total) by mouth daily. 12/19/24   Hunsucker, Donnice JONELLE, MD  Probiotic Product (PROBIOTIC DAILY PO) Take 1 capsule by mouth daily.  [provider]  Respiratory Therapy Supplies (FLUTTER) DEVI Use as directed. 04/19/18   Parrett, Madelin RAMAN, NP  rifabutin  (MYCOBUTIN ) 150 MG capsule Take 300 mg by mouth 2 (two) times daily. 03/03/24   [provider]  sodium chloride  HYPERTONIC 3 % nebulizer solution Take 4 mLs by nebulization as needed. 01/07/24 01/10/25  [provider]  Spacer/Aero-Holding Chambers (AEROCHAMBER MV) inhaler Use as instructed 03/24/18   Alaine Vicenta NOVAK, MD  TURMERIC PO Take 2,000 mg by mouth daily.     [provider]    Allergies: Patient has no known allergies.    Review of Systems  Updated Vital Signs BP (!) 158/77 (BP Location: Left Arm)   Pulse 95    Temp (!) 96.7 F (35.9 C) (Oral)   Resp 20   SpO2 100%   Physical Exam Vitals and nursing note reviewed.  Constitutional:      General: She is not in acute distress.    Appearance: She is well-developed. She is not ill-appearing.  HENT:     Head: Normocephalic and atraumatic.     Mouth/Throat:     Mouth: Mucous membranes are moist.  Eyes:     Extraocular Movements: Extraocular movements intact.     Conjunctiva/sclera: Conjunctivae normal.     Pupils: Pupils are equal, round, and reactive to light.  Cardiovascular:     Rate and Rhythm: Normal rate and regular rhythm.     Pulses: Normal pulses.     Heart sounds: Normal heart sounds. No murmur heard. Pulmonary:     Effort: Tachypnea present. No respiratory distress.     Breath sounds: Examination of the right-upper field reveals decreased breath sounds. Examination of the right-middle field reveals decreased breath sounds. Examination of the right-lower field reveals decreased breath sounds. Decreased breath sounds present.  Abdominal:     Palpations: Abdomen is soft.     Tenderness: There is no abdominal tenderness.  Musculoskeletal:        General: No swelling.     Cervical back: Normal range of motion and neck supple.  Skin:    General: Skin is warm and dry.     Capillary Refill: Capillary refill takes less than 2 seconds.  Neurological:     General: No focal deficit present.     Mental Status: She is alert.  Psychiatric:        Mood and Affect: Mood normal.     (all labs ordered are listed, but only abnormal results are displayed) Labs Reviewed  CBC WITH DIFFERENTIAL/PLATELET - Abnormal; Notable for the following components:      Result Value   WBC 15.0 (*)    RDW 15.8 (*)    Neutro Abs 12.3 (*)    Abs Immature Granulocytes 0.13 (*)    All other components within normal limits  COMPREHENSIVE METABOLIC PANEL WITH GFR - Abnormal; Notable for the following components:   Glucose, Bld 139 (*)    AST 44 (*)     Alkaline Phosphatase 134 (*)    All other components within normal limits  PRO BRAIN NATRIURETIC PEPTIDE - Abnormal; Notable for the following components:   Pro Brain Natriuretic Peptide 413.0 (*)    All other components within normal limits  TROPONIN T, HIGH SENSITIVITY - Abnormal; Notable for the following components:   Troponin T High Sensitivity 25 (*)    All other components within normal limits  RESP PANEL BY RT-PCR (RSV, FLU A&B, COVID)  [id number]    EKG: EKG  Interpretation Date/Time:  Wednesday January 03 2025 09:51:13 EST Ventricular Rate:  93 PR Interval:  142 QRS Duration:  99 QT Interval:  391 QTC Calculation: 487 R Axis:   22  Text Interpretation: Sinus rhythm Atrial premature complexes Confirmed by Ruthe Cornet [phone]) on 01/03/2025 9:57:08 AM  Radiology: [facility] Chest Portable 1 View Result Date: 01/03/2025 CLINICAL DATA:  Cough, shortness of breath EXAM: PORTABLE CHEST 1 VIEW COMPARISON:  October 29, 2022.  January 21, 2024. FINDINGS: The heart size and mediastinal contours are within normal limits. Interval development of large right basilar pneumothorax. Cavitary abnormality is again noted in right upper lobe most consistent with abscess. Diffuse reticular opacities are noted throughout lungs due to chronic inflammation or scarring. The visualized skeletal structures are unremarkable. IMPRESSION: Interval development of large right basilar pneumothorax. Critical Value/emergent results were called by telephone at the time of interpretation on 01/03/2025 at 10:44 am to provider Hamdi Vari , who verbally acknowledged these results. Electronically Signed   By: Lynwood Landy Raddle M.D.   On: 01/03/2025 10:45     Procedures   Medications Ordered in the ED  acyclovir  (ZOVIRAX ) 200 MG capsule 400 mg (has no administration in time range)  rifabutin  (MYCOBUTIN ) capsule 150 mg (has no administration in time range)  ethambutol  (MYAMBUTOL ) tablet 400 mg (has no administration in time  range)  budesonide -glycopyrrolate -formoterol  (BREZTRI ) 160-9-4.8 MCG/ACT inhaler 2 puff (has no administration in time range)  traMADol  (ULTRAM ) tablet 50 mg (has no administration in time range)  lidocaine -EPINEPHrine  (XYLOCAINE  W/EPI) 2 %-1:200000 (PF) injection 20 mL (has no administration in time range)  ketorolac  (TORADOL ) 15 MG/ML injection 15 mg (has no administration in time range)  sodium chloride  HYPERTONIC 3 % nebulizer solution 4 mL (has no administration in time range)  albuterol  (PROVENTIL ) (2.5 MG/3ML) 0.083% nebulizer solution 2.5 mg (has no administration in time range)  ipratropium-albuterol  (DUONEB) 0.5-2.5 (3) MG/3ML nebulizer solution 3 mL (3 mLs Nebulization Given 01/03/25 1009)  methylPREDNISolone  sodium succinate (SOLU-MEDROL ) 125 mg/2 mL injection 125 mg (125 mg Intravenous Given 01/03/25 1000)                                    Medical Decision Making Amount and/or Complexity of Data Reviewed Labs: ordered. Radiology: ordered.  Risk Prescription drug management. Decision regarding hospitalization.   KHANIYAH BEZEK is here with shortness of breath.  History of MAC, bronchiectasis.  Follows with pulmonology and infectious disease.  Sounds like she is on chronic treatment for this.  Symptoms worse.  Last few days and overnight.  She denies any fever or chills.  No sick contacts.  No leg swelling.  No recent surgery or travel.  Differential diagnosis could be ongoing infectious process or reactive airway process, pneumothorax, decreased sounds on the right than the left with diminished seems less likely to be ACS heart failure, low suspicion for PE.  Will check basic labs diskette diminished breath sounds throughout.  Poor air movement.  She is actually on room air on my evaluation.  She is mildly tachypneic.  Will give breathing treatment steroids check labs troponin BNP chest x-ray and consider CT scan of the chest.  Per my chart review it looks like she is on quadruple  therapy for her MAC.  She is on clofazimine , ethambutol , omadacycline , rifapmin.  Upon my review her chest x-ray does look like there is a pneumothorax in the right.  Will talk  with pulmonology team.  Pulmonary team to evaluate the patient.  Will decide whether they will do chest tube or maybe needs some sort of IR/CT-guided chest tube.  She is clinically stable at this time.  I put her on 2 L of oxygen for supportive care but she has unremarkable vitals.  Lab work thus far with white count of 15 but no significant anemia.  Overall pulmonology team to place chest tube.  Will admit the patient to hospitalist service for further care.  Lab work was overall unremarkable.  Troponin 25.  proBNP 413.  Suspect pneumothorax spontaneous from cavitary lesion that she has had from her MAC.  Will admit to medicine for further care.  Pulmonology team to manage chest tube.  This chart was dictated using voice recognition software.  Despite best efforts to proofread,  errors can occur which can change the documentation meaning.      Final diagnoses:  Spontaneous pneumothorax    ED Discharge Orders     None          Ruthe Cornet, DO 01/03/25 1124  "

## 2025-01-03 NOTE — H&P (Cosign Needed Addendum)
 " Date: 01/03/2025               Patient Name:  Kiara Medina MRN: [id number]  DOB: 02-May-1946 Age / Sex: 79 y.o., female   PCP: Keren Vicenta BRAVO, MD         Medical Service: Internal Medicine Teaching Service         Attending Physician: Dr. Trudy, Mliss Dragon, MD      First Contact: Dr. Letha Cheadle, MD    Second Contact: Dr. 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  Last night she woke up with right-sided chest pain and acute onset shortness of breath that did not respond to her rescue inhaler.  She called EMS who evaluated her and found that she was hypertensive but overall stable.  They offered to bring her to the emergency department but she declined due to feeling better.  She then slept the rest of the night in a recliner but continued to have right-sided chest pain and shortness of breath that then got much worse early this morning and she called EMS again.  She did have a more strenuous day yesterday and did more housework but other than that no recent changes in medications, routine, or any trauma.  ED Course: Presented as above moderately hypertensive with a blood pressure of 160/80, afebrile, tachypneic, and maintaining saturations on 2 L of supplemental oxygen.  Initial labs showed leukocytosis of 15, glucose of 139, alkaline phosphatase of 134, AST of 44, troponin of 25, proBNP of 413, and negative flu/COVID/RSV.  Chest x-ray showed a large right-sided pneumothorax.  Pulmonary critical care medicine was called and placed a chest tube and recommended  admission.  Past Medical History Bronchiectasis Chronic MDR MAI infection Anxiety/depression Osteoporosis  Meds:  Acyclovir  400 mg twice daily Omeprazole  40 mg daily Brinsupri  10 mg daily Atorvastatin  20 mg daily Rifabutin  150 mg twice daily  Nuzyra  150 mg twice daily Ethambutol  100 mg twice daily Clofazimine  100 mg twice daily Breztri  2 puffs twice daily Benzonatate  200 mg as needed Albuterol  as needed Alendronate 70 mg weekly Escitalopram  10 mg daily Vitamin D3 Calcium  Flaxseed oil Vitamin B12 Turmeric Probiotics Melatonin  Past Surgical History Past Surgical History:  Procedure Laterality Date   APPENDECTOMY     CATARACT EXTRACTION  08/2013   CHOLECYSTECTOMY     TUBAL LIGATION     VESICOVAGINAL FISTULA CLOSURE W/ TAH      Social:  Lives alone and is independent in ADLs IADLs. PCP: Keren Vicenta BRAVO, MD Substances: 20-pack-year history, quit over 40 years ago.  Denies alcohol  or drug use.  Family History:  Family History  Problem Relation Age of Onset   Emphysema Sister    Stroke Mother    Clotting disorder Mother     Allergies: Allergies as of 01/03/2025 - Review Complete 01/03/2025  Allergen Reaction Noted   Oxycodone hcl Other (See Comments) 01/03/2025    Review of Systems: A complete ROS was negative except as per HPI.   OBJECTIVE:   Physical Exam: Blood pressure (!) 160/80, pulse 97, temperature (!)  96.7 F (35.9 C), temperature source Oral, resp. rate (!) 25, SpO2 98%.  Constitutional: Chronically ill-appearing female laying in bed. In no acute distress. HENT: Normocephalic, atraumatic,  Eyes: Sclera non-icteric, PERRL, EOM intact Cardio:Regular rate and rhythm with occasional PACs. 2+ bilateral radial and dorsalis pedis  pulses. Pulm: Coarse breath sounds throughout lung fields, air movement throughout all lung fields.  Chest tube in place on the right. Normal work of breathing on room air. Abdomen: Soft, non-tender, non-distended,  positive bowel sounds. FDX:Wzhjupcz for extremity edema. Skin:Warm and dry. Neuro:Alert and oriented x3. No focal deficit noted. Psych:Pleasant mood and affect.  Labs: CBC    Component Value Date/Time   WBC 15.0 (H) 01/03/2025 0955   RBC 4.68 01/03/2025 0955   HGB 13.5 01/03/2025 0955   HCT 41.0 01/03/2025 0955   PLT 277 01/03/2025 0955   MCV 87.6 01/03/2025 0955   MCH 28.8 01/03/2025 0955   MCHC 32.9 01/03/2025 0955   RDW 15.8 (H) 01/03/2025 0955   LYMPHSABS 1.6 01/03/2025 0955   MONOABS 0.9 01/03/2025 0955   EOSABS 0.1 01/03/2025 0955   BASOSABS 0.0 01/03/2025 0955     CMP     Component Value Date/Time   NA 135 01/03/2025 0955   K 3.9 01/03/2025 0955   CL 100 01/03/2025 0955   CO2 24 01/03/2025 0955   GLUCOSE 139 (H) 01/03/2025 0955   BUN 12 01/03/2025 0955   CREATININE 0.68 01/03/2025 0955   CREATININE 0.65 06/02/2023 0322   CALCIUM  9.2 01/03/2025 0955   PROT 7.4 01/03/2025 0955   PROT 6.8 11/13/2024 1342   ALBUMIN 3.8 01/03/2025 0955   ALBUMIN 3.7 (L) 11/13/2024 1342   AST 44 (H) 01/03/2025 0955   ALT 30 01/03/2025 0955   ALKPHOS 134 (H) 01/03/2025 0955   BILITOT 0.5 01/03/2025 0955   BILITOT 0.4 11/13/2024 1342   GFRNONAA >60 01/03/2025 0955   GFRAA >60 06/01/2011 1457    Imaging: DG Chest Port 1 View Result Date: 01/03/2025 CLINICAL DATA:  Chest tube placement EXAM: PORTABLE CHEST 1 VIEW COMPARISON:  Same day FINDINGS: Interval placement of right-sided chest tube. Right-sided pneumothorax noted on prior exam is significantly decreased in size. Continued diffuse lung opacities as well as cavitary abnormality seen in the right upper lobe. IMPRESSION: Interval placement of right-sided chest tube with significant decrease in size of right-sided pneumothorax. Electronically Signed   By: Lynwood Landy Raddle M.D.   On: 01/03/2025 12:51   CT CHEST WO CONTRAST Result Date: 01/03/2025 CLINICAL DATA:  Shortness of breath and cough. Known spontaneous pneumothorax. EXAM:  CT CHEST WITHOUT CONTRAST TECHNIQUE: Multidetector CT imaging of the chest was performed following the standard protocol without IV contrast. RADIATION DOSE REDUCTION: This exam was performed according to the departmental dose-optimization program which includes automated exposure control, adjustment of the mA and/or kV according to patient size and/or use of iterative reconstruction technique. COMPARISON:  Chest x-ray today and chest CT 01/21/2024. FINDINGS: Cardiovascular: Heart is normal size. Calcified plaque over the left main and 3 vessel coronary arteries. Calcified plaque over the descending thoracic aorta. No evidence of thoracic aortic aneurysm. Pulmonary arterial system and remaining vascular structures are unremarkable on this noncontrast exam. Mediastinum/Nodes: No significant mediastinal or hilar adenopathy on this noncontrast exam. Remaining mediastinal structures are unremarkable. Lungs/Pleura: Right anterolateral pigtail pleural drainage catheter is in place. Evidence of patient's known moderate size pneumothorax. Air-fluid level over the posterior right basilar thorax compatible with component of hydropneumothorax. Stable large thick-walled cavitary lesion over  the right apex measuring approximately 5.9 x 6.2 cm. Stable bilateral patchy nodular airspace process with areas of cavitation as well as centrilobular nodularity and mucous plugging. Bronchiectatic changes. These findings are not simply changed overall and suggest chronic mycobacterium infection. Previously seen discrete 1.1 cm nodule over the left upper lobe is smaller as this currently measures 6 mm. No left-sided effusion. Upper Abdomen: Calcified plaque over the visualized abdominal aorta. Previous cholecystectomy. Remaining visualized upper abdominal structures are unchanged. Musculoskeletal: No focal abnormality. IMPRESSION: 1. Right anterolateral pigtail pleural drainage catheter in place with evidence of patient's known moderate  size pneumothorax. Air-fluid level over the posterior right basilar thorax compatible with component of hydropneumothorax. 2. Stable large thick-walled cavitary lesion over the right apex measuring 5.9 x 6.2 cm. Stable bilateral pulmonary parenchymal findings as described. These findings are not significantly changed overall and suggest chronic Mycobacterium infection. Previously seen discrete 1.1 cm nodule over the left upper lobe is smaller as this currently measures 6 mm. 3. Aortic atherosclerosis. Atherosclerotic coronary artery disease. Aortic Atherosclerosis (ICD10-I70.0). Electronically Signed   By: Toribio Agreste M.D.   On: 01/03/2025 12:50   DG Chest Portable 1 View Result Date: 01/03/2025 CLINICAL DATA:  Cough, shortness of breath EXAM: PORTABLE CHEST 1 VIEW COMPARISON:  October 29, 2022.  January 21, 2024. FINDINGS: The heart size and mediastinal contours are within normal limits. Interval development of large right basilar pneumothorax. Cavitary abnormality is again noted in right upper lobe most consistent with abscess. Diffuse reticular opacities are noted throughout lungs due to chronic inflammation or scarring. The visualized skeletal structures are unremarkable. IMPRESSION: Interval development of large right basilar pneumothorax. Critical Value/emergent results were called by telephone at the time of interpretation on 01/03/2025 at 10:44 am to provider ADAM CURATOLO , who verbally acknowledged these results. Electronically Signed   By: Lynwood Landy Raddle M.D.   On: 01/03/2025 10:45     EKG: personally reviewed my interpretation is sinus rhythm with PACs and varying P wave morphologies.  More PACs than prior however last EKG is from 2012.  ASSESSMENT & PLAN:   Assessment & Plan by Problem: Principal Problem:   Spontaneous pneumothorax Active Problems:   BRONCHIECTASIS   Mycobacterium avium-intracellulare complex (HCC)   Acute hypoxic respiratory failure (HCC)   Abnormal liver  enzymes   Kiara Medina is a 79 y.o. 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  Spontaneous pneumothorax, right Acute hypoxic respiratory failure Bronchiectasis with chronic MDR MAI infection Currently undergoing outpatient treatment for exacerbation of bronchiectasis with prednisone  and levofloxacin  that was improving before waking up in the melanite with acute onset right-sided chest pain and shortness of breath.  Chest x-ray showed large right-sided pneumothorax.  Pulmonology placed a chest tube to suction.  After chest tube placement she was able to be weaned to room air.  We will continue her MAI treatment, levofloxacin , and her prednisone  taper. - Continue quadruple therapy, 7 more days of levofloxacin  750 mg daily, prednisone  40 mg daily for 3 days then 20 mg daily for 3 days then 10 mg daily for 3 days - Appreciate pulmonology, monitor chest tube - Page for any new oxygen requirements, new shortness of breath  Abnormal liver enzymes Alkaline phosphatase 134 and AST 44.  Compared to labs from 11/13/2024 all of these have improved.  We will repeat  a CMP tomorrow but no abdominal pain here. - CMP in the morning  Diet: Normal VTE: Enoxaparin  Code: Full  Dispo: Admit patient to Inpatient with expected length of stay greater than 2 midnights.  Signed: Fairy Pool, DO Internal Medicine Resident, PGY-3 Please contact the on call pager at [phone] for any urgent or emergent needs. 1:34 PM 01/03/2025 "

## 2025-01-03 NOTE — Consult Note (Addendum)
 "  NAME:  Kiara Medina, MRN:  [id number], DOB:  1946-08-23, LOS: 0 ADMISSION DATE:  01/03/2025, CONSULTATION DATE:  01/04/2024 REFERRING MD:  Dr. 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 Annella with pulmonary and [facility] for infectious disease. Not on home oxygen. Normal state of health until Monday, 1/19 when she begin having shortness of breath. Overnight with increased shortness of breath. On EMS arrival oxygen saturation 90% on room air. Placed on 4L nasal cannula. Presented to ED on 1/21. CXR with large right pneumothorax. Pulmonary Consulted.   Pertinent  Medical History  MDR MAC, Bronchiectasis, HLD   Significant Hospital Events: Including procedures, antibiotic start and stop dates in addition to other pertinent events   1/21 Presents to ER with large right pneumothorax   Interim History / Subjective:  As above.   Objective    Blood pressure (!) 158/77, pulse 95, temperature (!) 96.7 F (35.9 C), temperature source Oral, resp. rate 20, SpO2 100%.       No intake or output data in the 24 hours ending 01/03/25 1123 There were no vitals filed for this visit.  Examination: General: Elderly female sitting in bed, moderate respiratory distress  HENT: Dry MM  Lungs: Absent breath sounds over right, left clear, moderate use of accessory muscles, NRB in place   Cardiovascular: Tachy, heart rate 101, no mRG Abdomen: soft, non-distended, active bowel sounds  Extremities: -edema Neuro: alert, oriented, follows commands  GU: deferred.   Resolved problem list   Assessment and Plan   Acute Hypoxic Respiratory Failure in setting of large right pneumothorax, most likely in setting of MAC  Plan  - Place Cook catheter  - Chest tube to water seal > during chest tube placement patient with increased chest pain on suction, leave  on water seal for now with repeat imaging pending.  - Obtain CT Chest  - Xray in AM  - Titrate supplemental oxygen for saturation goal >92  Hx MDR MAC Plan  - Continue quad therapy  - Continue home inhalers and nebulizer  Acute Pain  Plan  - Toradol  x 1 now  - Tramadol  PRN   Remaining management per primary team    Labs   CBC: Recent Labs  Lab 01/03/25 0955  WBC 15.0*  NEUTROABS 12.3*  HGB 13.5  HCT 41.0  MCV 87.6  PLT 277    Basic Metabolic Panel: Recent Labs  Lab 01/03/25 0955  NA 135  K 3.9  CL 100  CO2 24  GLUCOSE 139*  BUN 12  CREATININE 0.68  CALCIUM  9.2   GFR: CrCl cannot be calculated (Unknown ideal weight.). Recent Labs  Lab 01/03/25 0955  WBC 15.0*    Liver Function Tests: Recent Labs  Lab 01/03/25 0955  AST 44*  ALT 30  ALKPHOS 134*  BILITOT 0.5  PROT 7.4  ALBUMIN 3.8   No results for input(s): LIPASE, AMYLASE in the last 168 hours. No results for input(s): AMMONIA in the last 168 hours.  ABG No results found for: PHART, PCO2ART, PO2ART, HCO3, TCO2, ACIDBASEDEF, O2SAT   Coagulation Profile: No results for input(s): INR, PROTIME in the last 168 hours.  Cardiac Enzymes: No results for input(s): CKTOTAL, CKMB, CKMBINDEX, TROPONINI in the last 168 hours.  HbA1C: No results found for: HGBA1C  CBG: No results for input(s): GLUCAP in the  last 168 hours.  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   Surgical History:   Past Surgical History:  Procedure Laterality Date   APPENDECTOMY     CATARACT EXTRACTION  08/2013   CHOLECYSTECTOMY     TUBAL LIGATION     VESICOVAGINAL FISTULA CLOSURE W/ TAH       Social History:   reports that she quit smoking about 39  years ago. Her smoking use included cigarettes. She started smoking about 59 years ago. She has a 20 pack-year smoking history. She has never used smokeless tobacco. She reports that she does not currently use alcohol  after a past usage of about 1.0 standard drink of alcohol  per week. She reports that she does not use drugs.   Family History:  Her family history includes Clotting disorder in her mother; Emphysema in her sister; Stroke in her mother.   Allergies Allergies[1]   Home Medications  Prior to Admission medications  Medication Sig Start Date End Date Taking? Authorizing Provider  acyclovir  (ZOVIRAX ) 400 MG tablet Take 400 mg by mouth 2 (two) times daily.    [provider]  albuterol  (VENTOLIN  HFA) 108 (90 Base) MCG/ACT inhaler Inhale 2 puffs into the lungs every 6 (six) hours as needed for wheezing or shortness of breath. 04/13/24   Hunsucker, Donnice SAUNDERS, MD  alendronate (FOSAMAX) 70 MG tablet Take 70 mg by mouth once a week. 01/25/23   [provider]  B Complex Vitamins (B COMPLEX-B12 PO) Take 1 tablet by mouth daily.    [provider]  benzonatate  (TESSALON ) 200 MG capsule Take 1 capsule (200 mg total) by mouth 3 (three) times daily as needed for cough. 11/02/23   Hunsucker, Donnice SAUNDERS, MD  Brensocatib  (BRINSUPRI ) 10 MG TABS Take 1 tablet by mouth 1 time daily with or without food for bronchiectasis. 11/07/24   Hunsucker, Donnice SAUNDERS, MD  budesonide -glycopyrrolate -formoterol  (BREZTRI  AEROSPHERE) 160-9-4.8 MCG/ACT AERO inhaler Inhale 2 puffs into the lungs in the morning and at bedtime. 05/10/24   Hunsucker, Donnice SAUNDERS, MD  calcium  carbonate (OSCAL) 1500 (600 Ca) MG TABS tablet Take 600 mg of elemental calcium  by mouth 2 (two) times daily with a meal.    [provider]  Cholecalciferol  (VITAMIN D3) 125 MCG (5000 UT) TABS Take 1 tablet by mouth daily.    [provider]  CLOFAZIMINE  PO Take 100 mg by mouth daily.    [provider]   ethambutol  (MYAMBUTOL ) 400 MG tablet Take 2 tablets (800 mg total) by mouth daily. 06/27/24   Fleeta Rothman, Jomarie SAILOR, MD  Flaxseed, Linseed, (FLAX SEED OIL) 1300 MG CAPS Take 1 tablet by mouth daily.    [provider]  ipratropium (ATROVENT) 0.06 % nasal spray Place 2 sprays into both nostrils 4 (four) times daily. 04/03/24   [provider]  Melatonin 10 MG CAPS Take 10 mg by mouth at bedtime.    [provider]  mirtazapine (REMERON) 15 MG tablet Take by mouth. 02/13/24 11/23/24  [provider]  mometasone  (NASONEX ) 50 MCG/ACT nasal spray Place 2 sprays into the nose 2 (two) times daily. 11/08/23   Soldatova, Liuba, MD  NUZYRA  150 MG TABS Take 150 mg by mouth 2 (two) times daily.    [provider]  omeprazole  (PRILOSEC) 40  MG capsule Take 1 capsule (40 mg total) by mouth daily. 12/19/24   Hunsucker, Donnice SAUNDERS, MD  Probiotic Product (PROBIOTIC DAILY PO) Take 1 capsule by mouth daily.    [provider]  Respiratory Therapy Supplies (FLUTTER) DEVI Use as directed. 04/19/18   Parrett, Madelin RAMAN, NP  rifabutin  (MYCOBUTIN ) 150 MG capsule Take 300 mg by mouth 2 (two) times daily. 03/03/24   [provider]  sodium chloride  HYPERTONIC 3 % nebulizer solution Take 4 mLs by nebulization as needed. 01/07/24 01/10/25  [provider]  Spacer/Aero-Holding Chambers (AEROCHAMBER MV) inhaler Use as instructed 03/24/18   Alaine Vicenta NOVAK, MD  TURMERIC PO Take 2,000 mg by mouth daily.     [provider]     Time Spent: 55 minutes   Dennis An, AGACNP-BC [place] [facility]  PCCM Pgr: [phone]         [1] No Known Allergies  "

## 2025-01-03 NOTE — ED Triage Notes (Signed)
 [facility] reports pt coming from home for [facility]. Pt went to doctors on Monday for same and has not gotten any better since. Last night around midnight started feeling bad and woke up feeling worse. Upon EMS arrival pt pulse ox 90% on RA. Placed on 4L and came up to 96%. Pt states she get extremely shob on exertion. Pt does not wear oxygen at home. Pt states she has MAC for the past 10 years.

## 2025-01-03 NOTE — Hospital Course (Addendum)
#  Secondary Spontaneous pneumothorax, right #Acute hypoxic respiratory failure #Bronchiectasis with chronic MDR MAC infection Initially presented to the hospital with dyspnea and right-sided chest pain in the context of chronic MDR MAC c/b bronchiectasis managed by Dr. Annella (Pulmonology) and Dr. Evonne [facility] ID) in the outpatient setting. Chest imaging showed a pneumothorax which was treated with a chest tube per PCCM recommendations. The patient did unfortunately suffer dislodgment of the chest tube during her admission with return of her pneumothorax.  The chest tube was reinserted without complications and repeat chest x-rays after the second insertion showed improvement of PTX.  The patient presented on room air and required oxygen up to high flow nasal cannula but improved back to room air several days prior to discharge.  We continued her home medications for her chronic MAC/bronchiectasis, though they were not on hospital formulary so the patient had to bring them in from home for our pharmacy to dispense.  Pain regimen included IV Toradol  with increase in Cr so was switched back to Tylenol  and tramadol . Chest tube removed on day of discharge and patient was able to ambulate with no drop in oxygen. Discharged with home health PT. Instructed patient to take the prednisone  (previously prescribed by her PCP) 20 mg tomorrow and 10 mg for the following 3 days.   #Constipation Patient had some trouble with constipation during this admission but was given PRN miralax  which helped her return to normal daily bowel movement.  #Hyperkalemia K elevated up to 5.2. Received 1 time dose of Lokelma  which improved K to normal range. Discharged with normal K at 4.1.  #Abnormal liver enzymes, resolved Alk phos/AST were initially elevated upon admission but patient did not have any abdominal symptoms or N/V/D. Repeat CMP showed these values normalized.  Chronic Stable Medical Conditions   #Hyperlipidemia -  Continued home Lipitor 40 mg daily #Osteoporosis - Continued cholecalciferol  and calcium  carbonate #GERD - Protonix  40 mg daily while admitted #Depression/anxiety - Continued escitalopram  10 mg daily

## 2025-01-03 NOTE — Procedures (Signed)
 Insertion of Chest Tube Procedure Note  DAVEIGH BATTY  [id number]  10/26/1946  Date:01/03/25  Time:12:11 PM    Provider Performing: Corean CHRISTELLA Maxen Rowland   Procedure: Pleural Catheter Insertion w/o Imaging Guidance (67443)  Indication(s): Pneumothorax  Consent: Risks of the procedure as well as the alternatives and risks of each were explained to the patient and/or caregiver.  Consent for the procedure was obtained and is signed in the bedside chart  Anesthesia: Topical only with 1% lidocaine    Time Out: Verified patient identification, verified procedure, site/side was marked, verified correct patient position, special equipment/implants available, medications/allergies/relevant history reviewed, required imaging and test results available.  Sterile Technique: Maximal sterile technique including full sterile barrier drape, hand hygiene, sterile gown, sterile gloves, mask, hair covering, sterile ultrasound probe cover (if used).  Procedure Description: Overlying skin marked. Area of placement cleaned and draped in sterile fashion.  A 14 French pigtail pleural catheter was placed into the right pleural space using Seldinger technique. Appropriate return of air was obtained.  The tube was connected to atrium and placed on -20 cm H2O wall suction, however patient was unable to tolerate wall suction and this was disconnected; tube placed to atrium alone.  Complications/Tolerance: None; patient tolerated the procedure well. Chest X-ray is ordered to verify placement, however CT Chest may precede this.  EBL: Minimal  Specimen(s): none  Corean CHRISTELLA Athene Schuhmacher, [place] [facility] 01/03/25 12:14 PM  Please see [email or web address] for pager details.  From 7A-7P if no response, please call [phone] After hours, please call ELink [phone]

## 2025-01-03 NOTE — Progress Notes (Signed)
 Will start flutter valve in the morning, patient c/o right sided pain at this time.

## 2025-01-03 NOTE — Progress Notes (Signed)
 "  HD#0 SUBJECTIVE:  Patient Summary: Kiara Medina is a 79 y.o. female with a pertinent PMH of multilobular bronchiectasis c/b chronic MDR MAC, asthma, anxiety, depression who presented with acute-onset right-sided CP and dyspnea and was admitted for right spontaneous pneumothorax.  Overnight Events: No overnight events.  Interim History: ***  OBJECTIVE:  Vital Signs: Vitals:   01/03/25 1500 01/03/25 1524 01/03/25 1530 01/03/25 1802  BP: (!) 146/66 (!) 140/67 134/61 (!) 140/65  Pulse: (!) 106 (!) 102 95 84  Resp: (!) 26 16 (!) 23 20  Temp:  97.7 F (36.5 C)  97.7 F (36.5 C)  TempSrc:  Oral    SpO2: 100% 100% 100% 100%    There were no vitals filed for this visit.  No intake or output data in the 24 hours ending 01/03/25 1807 Net IO Since Admission: No IO data has been entered for this period [01/03/25 1807]  Physical Exam: Constitutional: {NAMES:3044014::Well,Ill,Chronically ill,***}-appearing {NAMES:3044014::obese,***} female in no acute distress HENT: normocephalic atraumatic, mucous membranes moist Eyes: conjunctiva non-erythematous, PERRL, no scleral icterus Cardiovascular: regular rate and rhythm, no m/r/g Pulmonary/Chest: normal work of breathing on ***room air, lungs clear to auscultation bilaterally; chest tube in place on right Abdominal: soft, non-tender, non-distended, bowel sounds normal Neurological: alert & oriented x3, moving all extremities equally Skin: warm and dry Extremities: no edema or cyanosis; peripheral pulses intact Psych: normal mood and affect, thought content normal  Patient Lines/Drains/Airways Status     Active Line/Drains/Airways     Name Placement date Placement time Site Days   Peripheral IV 01/03/25 18 G 1 Right Antecubital 01/03/25  0949  Antecubital  less than 1   Chest Tube 1 Lateral;Right Pleural 14 Fr. 01/03/25  1145  Pleural  less than 1            Pertinent labs and imaging:     Latest Ref Rng & Units  01/03/2025    9:55 AM 06/02/2023    3:22 AM 05/19/2022    3:59 PM  CBC  WBC 4.0 - 10.5 K/uL 15.0  10.3  7.3   Hemoglobin 12.0 - 15.0 g/dL 86.4  86.8  87.3   Hematocrit 36.0 - 46.0 % 41.0  40.7  39.4   Platelets 150 - 400 K/uL 277  272  242        Latest Ref Rng & Units 01/03/2025    9:55 AM 11/13/2024    1:42 PM 10/06/2024    2:39 PM  CMP  Glucose 70 - 99 mg/dL 860     BUN 8 - 23 mg/dL 12     Creatinine 9.55 - 1.00 mg/dL 9.31     Sodium 864 - 854 mmol/L 135     Potassium 3.5 - 5.1 mmol/L 3.9     Chloride 98 - 111 mmol/L 100     CO2 22 - 32 mmol/L 24     Calcium  8.9 - 10.3 mg/dL 9.2     Total Protein 6.5 - 8.1 g/dL 7.4  6.8  7.0   Total Bilirubin 0.0 - 1.2 mg/dL 0.5  0.4  0.5   Alkaline Phos 38 - 126 U/L 134  173  205   AST 15 - 41 U/L 44  80  79   ALT 0 - 44 U/L 30  68  63     DG Chest Port 1 View Result Date: 01/03/2025 CLINICAL DATA:  Chest tube placement EXAM: PORTABLE CHEST 1 VIEW COMPARISON:  Same day FINDINGS: Interval placement of  right-sided chest tube. Right-sided pneumothorax noted on prior exam is significantly decreased in size. Continued diffuse lung opacities as well as cavitary abnormality seen in the right upper lobe. IMPRESSION: Interval placement of right-sided chest tube with significant decrease in size of right-sided pneumothorax. Electronically Signed   By: Lynwood Landy Raddle M.D.   On: 01/03/2025 12:51   CT CHEST WO CONTRAST Result Date: 01/03/2025 CLINICAL DATA:  Shortness of breath and cough. Known spontaneous pneumothorax. EXAM: CT CHEST WITHOUT CONTRAST TECHNIQUE: Multidetector CT imaging of the chest was performed following the standard protocol without IV contrast. RADIATION DOSE REDUCTION: This exam was performed according to the departmental dose-optimization program which includes automated exposure control, adjustment of the mA and/or kV according to patient size and/or use of iterative reconstruction technique. COMPARISON:  Chest x-ray today and chest CT  01/21/2024. FINDINGS: Cardiovascular: Heart is normal size. Calcified plaque over the left main and 3 vessel coronary arteries. Calcified plaque over the descending thoracic aorta. No evidence of thoracic aortic aneurysm. Pulmonary arterial system and remaining vascular structures are unremarkable on this noncontrast exam. Mediastinum/Nodes: No significant mediastinal or hilar adenopathy on this noncontrast exam. Remaining mediastinal structures are unremarkable. Lungs/Pleura: Right anterolateral pigtail pleural drainage catheter is in place. Evidence of patient's known moderate size pneumothorax. Air-fluid level over the posterior right basilar thorax compatible with component of hydropneumothorax. Stable large thick-walled cavitary lesion over the right apex measuring approximately 5.9 x 6.2 cm. Stable bilateral patchy nodular airspace process with areas of cavitation as well as centrilobular nodularity and mucous plugging. Bronchiectatic changes. These findings are not simply changed overall and suggest chronic mycobacterium infection. Previously seen discrete 1.1 cm nodule over the left upper lobe is smaller as this currently measures 6 mm. No left-sided effusion. Upper Abdomen: Calcified plaque over the visualized abdominal aorta. Previous cholecystectomy. Remaining visualized upper abdominal structures are unchanged. Musculoskeletal: No focal abnormality. IMPRESSION: 1. Right anterolateral pigtail pleural drainage catheter in place with evidence of patient's known moderate size pneumothorax. Air-fluid level over the posterior right basilar thorax compatible with component of hydropneumothorax. 2. Stable large thick-walled cavitary lesion over the right apex measuring 5.9 x 6.2 cm. Stable bilateral pulmonary parenchymal findings as described. These findings are not significantly changed overall and suggest chronic Mycobacterium infection. Previously seen discrete 1.1 cm nodule over the left upper lobe is  smaller as this currently measures 6 mm. 3. Aortic atherosclerosis. Atherosclerotic coronary artery disease. Aortic Atherosclerosis (ICD10-I70.0). Electronically Signed   By: Toribio Agreste M.D.   On: 01/03/2025 12:50   DG Chest Portable 1 View Result Date: 01/03/2025 CLINICAL DATA:  Cough, shortness of breath EXAM: PORTABLE CHEST 1 VIEW COMPARISON:  October 29, 2022.  January 21, 2024. FINDINGS: The heart size and mediastinal contours are within normal limits. Interval development of large right basilar pneumothorax. Cavitary abnormality is again noted in right upper lobe most consistent with abscess. Diffuse reticular opacities are noted throughout lungs due to chronic inflammation or scarring. The visualized skeletal structures are unremarkable. IMPRESSION: Interval development of large right basilar pneumothorax. Critical Value/emergent results were called by telephone at the time of interpretation on 01/03/2025 at 10:44 am to provider ADAM CURATOLO , who verbally acknowledged these results. Electronically Signed   By: Lynwood Landy Raddle M.D.   On: 01/03/2025 10:45    ASSESSMENT/PLAN:  Assessment: Principal Problem:   Spontaneous pneumothorax Active Problems:   BRONCHIECTASIS   Mycobacterium avium-intracellulare complex (HCC)   Acute hypoxic respiratory failure (HCC)   Abnormal liver enzymes  Kiara Medina is a 79 y.o. female with a history of multilobular bronchiectasis c/b chronic MDR MAC, asthma, anxiety, depression who presented with acute-onset right-sided CP and dyspnea and was admitted for right spontaneous pneumothorax, now on hospital day 0.  Plan: #*** ***  #*** ***  #*** ***  Best Practice: Diet: {NAMES:3044014::Regular,Carb-Modified,Heart-Healthy,Low Sodium,NPO,***} IVF: Fluids: {Meds; iv fluids:31617}, Rate: {NAMES:3044014::None,*** cc/hr x *** hrs,*** cc bolus} VTE: enoxaparin  (LOVENOX ) injection 40 mg Start: 01/03/25 2200 Code:  {NAMES:3044014::Full,DNR,DNI,DNR/DNI,Comfort Care,Unknown}  Disposition planning: Therapy Recs: {NAMES:3044014::None,Pending,CIR,SNF,ALF,LTAC,Home Health}, DME: {Assistive Devices IFZ:80464} Family Contact: ***, {Family:304960261::to be notified.} DISPO: Anticipated discharge {NAMES:3044014::today,tomorrow,in *** days} to {Discharge Destination:18313::Home} pending {BARRIERS TO DISCHARGE:24135}.  Signature:  Letha Cheadle, MD [facility]  PGY-1 01/03/2025, 6:07 PM  On Call pager [phone]  "

## 2025-01-04 ENCOUNTER — Inpatient Hospital Stay (HOSPITAL_COMMUNITY)

## 2025-01-04 DIAGNOSIS — M810 Age-related osteoporosis without current pathological fracture: Secondary | ICD-10-CM | POA: Diagnosis not present

## 2025-01-04 DIAGNOSIS — K5900 Constipation, unspecified: Secondary | ICD-10-CM | POA: Diagnosis not present

## 2025-01-04 DIAGNOSIS — J479 Bronchiectasis, uncomplicated: Secondary | ICD-10-CM | POA: Diagnosis not present

## 2025-01-04 DIAGNOSIS — F418 Other specified anxiety disorders: Secondary | ICD-10-CM

## 2025-01-04 DIAGNOSIS — J9383 Other pneumothorax: Secondary | ICD-10-CM | POA: Diagnosis not present

## 2025-01-04 DIAGNOSIS — E785 Hyperlipidemia, unspecified: Secondary | ICD-10-CM | POA: Diagnosis not present

## 2025-01-04 DIAGNOSIS — J9312 Secondary spontaneous pneumothorax: Secondary | ICD-10-CM | POA: Diagnosis not present

## 2025-01-04 DIAGNOSIS — J9601 Acute respiratory failure with hypoxia: Secondary | ICD-10-CM | POA: Diagnosis not present

## 2025-01-04 DIAGNOSIS — Z8619 Personal history of other infectious and parasitic diseases: Secondary | ICD-10-CM | POA: Diagnosis not present

## 2025-01-04 DIAGNOSIS — A310 Pulmonary mycobacterial infection: Secondary | ICD-10-CM | POA: Diagnosis not present

## 2025-01-04 DIAGNOSIS — E875 Hyperkalemia: Secondary | ICD-10-CM | POA: Diagnosis not present

## 2025-01-04 DIAGNOSIS — K219 Gastro-esophageal reflux disease without esophagitis: Secondary | ICD-10-CM | POA: Diagnosis not present

## 2025-01-04 LAB — COMPREHENSIVE METABOLIC PANEL WITH GFR
ALT: 25 U/L (ref 0–44)
AST: 37 U/L (ref 15–41)
Albumin: 3.4 g/dL — ABNORMAL LOW (ref 3.5–5.0)
Alkaline Phosphatase: 107 U/L (ref 38–126)
Anion gap: 12 (ref 5–15)
BUN: 14 mg/dL (ref 8–23)
CO2: 23 mmol/L (ref 22–32)
Calcium: 8.6 mg/dL — ABNORMAL LOW (ref 8.9–10.3)
Chloride: 97 mmol/L — ABNORMAL LOW (ref 98–111)
Creatinine, Ser: 0.66 mg/dL (ref 0.44–1.00)
GFR, Estimated: >60 mL/min
Glucose, Bld: 132 mg/dL — ABNORMAL HIGH (ref 70–99)
Potassium: 4.5 mmol/L (ref 3.5–5.1)
Sodium: 132 mmol/L — ABNORMAL LOW (ref 135–145)
Total Bilirubin: 0.4 mg/dL (ref 0.0–1.2)
Total Protein: 6.4 g/dL — ABNORMAL LOW (ref 6.5–8.1)

## 2025-01-04 LAB — CBC
HCT: 33.5 % — ABNORMAL LOW (ref 36.0–46.0)
Hemoglobin: 11.2 g/dL — ABNORMAL LOW (ref 12.0–15.0)
MCH: 28.9 pg (ref 26.0–34.0)
MCHC: 33.4 g/dL (ref 30.0–36.0)
MCV: 86.3 fL (ref 80.0–100.0)
Platelets: 216 K/uL (ref 150–400)
RBC: 3.88 MIL/uL (ref 3.87–5.11)
RDW: 15.6 % — ABNORMAL HIGH (ref 11.5–15.5)
WBC: 9.1 K/uL (ref 4.0–10.5)
nRBC: 0 % (ref 0.0–0.2)

## 2025-01-04 MED ORDER — SODIUM CHLORIDE 3 % IN NEBU: 4.0000 mL | INHALATION_SOLUTION | RESPIRATORY_TRACT | Status: DC | PRN

## 2025-01-04 MED ORDER — SODIUM CHLORIDE 0.9% FLUSH
10.0000 mL | Freq: Three times a day (TID) | INTRAVENOUS | Status: DC
  Administered 2025-01-04 – 2025-01-09 (×15): 10 mL via INTRAPLEURAL

## 2025-01-04 MED ORDER — GUAIFENESIN ER 600 MG PO TB12
600.0000 mg | ORAL_TABLET | Freq: Two times a day (BID) | ORAL | Status: DC
  Administered 2025-01-04 – 2025-01-10 (×13): 600 mg via ORAL
  Filled 2025-01-04 (×13): qty 1

## 2025-01-04 MED ORDER — KETOROLAC TROMETHAMINE 15 MG/ML IJ SOLN
15.0000 mg | Freq: Once | INTRAMUSCULAR | Status: AC
  Administered 2025-01-04: 15 mg via INTRAVENOUS
  Filled 2025-01-04: qty 1

## 2025-01-04 MED ORDER — KETOROLAC TROMETHAMINE 15 MG/ML IJ SOLN: 15.0000 mg | Freq: Once | INTRAMUSCULAR | Status: DC

## 2025-01-04 MED ORDER — OMADACYCLINE TOSYLATE 150 MG PO TABS: 300.0000 mg | ORAL_TABLET | Freq: Every day | ORAL | Status: DC

## 2025-01-04 NOTE — Procedures (Addendum)
 Insertion of Chest Tube Procedure Note  Kiara Medina  [id number]  May 25, 1946  Date:01/04/25  Time:6:28 AM    Provider Performing: Sammi Gore   Procedure: Chest Tube Insertion (32551)  Indication(s) Pneumothorax  Consent Risks of the procedure as well as the alternatives and risks of each were explained to the patient and/or caregiver.  Consent for the procedure was obtained and is signed in the bedside chart  Anesthesia Topical only with 1% lidocaine     Time Out Verified patient identification, verified procedure, site/side was marked, verified correct patient position, special equipment/implants available, medications/allergies/relevant history reviewed, required imaging and test results available.   Sterile Technique Maximal sterile technique including full sterile barrier drape, hand hygiene, sterile gown, sterile gloves, mask, hair covering, sterile ultrasound probe cover (if used).   Procedure Description Ultrasound not used to identify appropriate pleural anatomy for placement and overlying skin marked. Area of placement cleaned and draped in sterile fashion.  A 14 French pigtail pleural catheter was placed into the right pleural space using Seldinger technique. Appropriate return of air was obtained.  The tube was connected to atrium and placed on -20 cm H2O wall suction.   Complications/Tolerance None; patient tolerated the procedure well. Chest X-ray is ordered to verify placement.   EBL Minimal  Specimen(s) none   Pt did not tolerate 20cm suction on initial chest tube. Attempted 20cm again after new chest tube placed this AM but she began to cough and have discomfort. Dropped to 10cm and RN instructed if persists, go to water seal.   Sammi Gore, PA - [facility] For pager details, please see AMION or use Epic chat  After 1900, please call ELINK for cross coverage needs 01/04/2025, 6:28 AM

## 2025-01-04 NOTE — Progress Notes (Signed)
 SATURATION QUALIFICATIONS: (This note is used to comply with regulatory documentation for home oxygen)  Patient Saturations on Room Air at Rest = 87%  Patient Saturations on Room Air while Ambulating = NT due to desaturates on RA  Patient Saturations on 6 Liters of oxygen while Ambulating = 88%  Please briefly explain why patient needs home oxygen:Needed 4LO2 at rest and 6LO2 with activity to keep sats > 88%. Geraldean Walen M,PT Acute Rehab Services [phone]

## 2025-01-04 NOTE — Evaluation (Addendum)
 Physical Therapy Evaluation Patient Details Name: Kiara Medina MRN: [id number] DOB: Sep 11, 1946 Today's Date: 01/04/2025  History of Present Illness  Kiara Medina is an 79 year old female admitted 1/21 with right-sided chest pain and acute onset shortness of breath that did not respond to her rescue inhaler. Found to have right spontaneous PTX.  Chest tube placed 1/21.  Chest tube replaced 1/22 as it had dislodged. 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 Pt tolerated session well overall. Good safety with RW.  Pt on 4LO2 on arrival. Did have DOE 3/4 and was fatigued at end of walk with pt walking with 6LO2.  O2 monitor did not register toward end of walk which is why incr to 6LO2 and when it did register O2 was 96% therefore decr pt back to 4LO2 prior to departure from room.   Encouraged incentive spirometer 10 x hour during day and pt agreed.  Pt should progress and be able to go home with HHPT f/u. Has RW.  Pt currently with functional limitations due to the deficits listed below (see PT Problem List). Pt will benefit from acute skilled PT to increase their independence and safety with mobility to allow discharge.           If plan is discharge home, recommend the following: Assistance with cooking/housework;Assist for transportation;Help with stairs or ramp for entrance   Can travel by private vehicle        Equipment Recommendations Other (comment) (? home O2 and pulse oximeter)  Recommendations for Other Services       Functional Status Assessment Patient has had a recent decline in their functional status and demonstrates the ability to make significant improvements in function in a reasonable and predictable amount of time.     Precautions / Restrictions Precautions Precautions: Fall Precaution/Restrictions Comments: Chest tube, O2 Restrictions Weight Bearing  Restrictions Per Provider Order: No      Mobility  Bed Mobility Overal bed mobility: Independent                  Transfers Overall transfer level: Needs assistance Equipment used: Rolling walker (2 wheels) Transfers: Sit to/from Stand Sit to Stand: Supervision           General transfer comment: cues for hand placement    Ambulation/Gait Ambulation/Gait assistance: Contact guard assist Gait Distance (Feet): 100 Feet Assistive device: Rolling walker (2 wheels) Gait Pattern/deviations: Step-through pattern, Decreased stance time - right   Gait velocity interpretation: <1.31 ft/sec, indicative of household ambulator   General Gait Details: Pt able to ambulate into hallway with good stability with RW. DOE 3/4 with O2 sats not picking up at times.  Stairs            Wheelchair Mobility     Tilt Bed    Modified Rankin (Stroke Patients Only)       Balance Overall balance assessment: Needs assistance Sitting-balance support: No upper extremity supported, Feet supported Sitting balance-Leahy Scale: Fair     Standing balance support: Bilateral upper extremity supported, During functional activity, Reliant on assistive device for balance Standing balance-Leahy Scale: Poor Standing balance comment: Pt relieson UE support                             Pertinent Vitals/Pain Pain Assessment Pain Assessment: No/denies pain    Home Living Family/patient expects to be discharged  to:: Private residence Living Arrangements: Alone Available Help at Discharge: Family;Available PRN/intermittently (children dont live close, grandson is close, sister may can come) Type of Home: House Home Access: Stairs to enter Entrance Stairs-Rails: Right Entrance Stairs-Number of Steps: 2   Home Layout: One level Home Equipment: Cane - single Librarian, Academic (2 wheels);Shower seat - built in;Grab bars - tub/shower;Hand held shower head      Prior Function  Prior Level of Function : Independent/Modified Independent;Driving                     Extremity/Trunk Assessment   Upper Extremity Assessment Upper Extremity Assessment: Defer to OT evaluation    Lower Extremity Assessment Lower Extremity Assessment: Generalized weakness    Cervical / Trunk Assessment Cervical / Trunk Assessment: Normal  Communication   Communication Communication: No apparent difficulties    Cognition Arousal: Alert Behavior During Therapy: WFL for tasks assessed/performed   PT - Cognitive impairments: No apparent impairments                         Following commands: Intact       Cueing       General Comments      Exercises     Assessment/Plan    PT Assessment Patient needs continued PT services  PT Problem List Decreased activity tolerance;Decreased balance;Decreased mobility;Decreased knowledge of use of DME;Decreased safety awareness;Decreased knowledge of precautions;Cardiopulmonary status limiting activity       PT Treatment Interventions DME instruction;Gait training;Functional mobility training;Therapeutic activities;Stair training;Therapeutic exercise;Balance training;Patient/family education    PT Goals (Current goals can be found in the Care Plan section)  Acute Rehab PT Goals Patient Stated Goal: to go home PT Goal Formulation: With patient Time For Goal Achievement: 01/18/25 Potential to Achieve Goals: Good    Frequency Min 2X/week     Co-evaluation               AM-PAC PT 6 Clicks Mobility  Outcome Measure Help needed turning from your back to your side while in a flat bed without using bedrails?: None Help needed moving from lying on your back to sitting on the side of a flat bed without using bedrails?: None Help needed moving to and from a bed to a chair (including a wheelchair)?: A Little Help needed standing up from a chair using your arms (e.g., wheelchair or bedside chair)?: A  Little Help needed to walk in hospital room?: A Little Help needed climbing 3-5 steps with a railing? : A Lot 6 Click Score: 19    End of Session Equipment Utilized During Treatment: Gait belt;Oxygen Activity Tolerance: Patient limited by fatigue Patient left: in bed;with call bell/phone within reach;with bed alarm set Nurse Communication: Mobility status PT Visit Diagnosis: Muscle weakness (generalized) (M62.81)    Time: 8861-8787 PT Time Calculation (min) (ACUTE ONLY): 34 min   Charges:   PT Evaluation $PT Eval Moderate Complexity: 1 Mod PT Treatments $Gait Training: 8-22 mins PT General Charges $$ ACUTE PT VISIT: 1 Visit         Zailynn Brandel M,PT Acute Rehab Services [phone]   Stephane JULIANNA Bevel 01/04/2025, 1:30 PM

## 2025-01-04 NOTE — Progress Notes (Signed)
 "  NAME:  Kiara Medina, MRN:  [id number], DOB:  08/27/1946, LOS: 1 ADMISSION DATE:  01/03/2025, CONSULTATION DATE:  01/04/25 REFERRING MD:  Trudy, CHIEF COMPLAINT:  pneumothorax   History of Present Illness:  Kiara Medina is an 79 y/o F with a PMH significant for MDR MAC c/b bronchiectasis who presents from home with acute on set pleuritic chest pain and cough found to have a secondary spontaneous R pneumothorax requiring tube thoracostomy. Pulmonology consulted for evaluation and management of the latter.   She experienced a sudden onset of symptoms when she laid down and began coughing, which is unusual for her except in the morning. This was accompanied by severe pain under the right rib cage and difficulty breathing, distinct from her usual shortness of breath that resolves with an inhaler. The patient denied fevers and chills, and reported no known recent exposure to sick contacts.   She has a history of Mycobacterium avium complex (MAC) lung disease for which she has been on treatment for ten years, taking four antibiotics. She has never had a negative sputum culture. She reports night sweats attributed to MAC and a persistent runny nose, which she believes is medication-related. She recently received a Kenalog  shot and started prednisone  and antibiotics after experiencing severe symptoms earlier in the week.   She mentions a dry throat, which she attributes to her current condition, and her cough has been producing clear sputum, whereas it is usually beige or tan  Pertinent  Medical History   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   Sinusitis     Significant Hospital Events: Including procedures, antibiotic start and stop dates in addition to other pertinent events   1/21 --> admit for seconday spontaneous pneumothorax, R chest tube  placed 1/22 --> chest tube dislodged, replaced by night team, remains on - 10 suction  Interim History / Subjective:  Feels better after chest tube placed. Still coughs intermittently  Objective    Blood pressure (!) 122/58, pulse 73, temperature 98.3 F (36.8 C), resp. rate 19, SpO2 95%.        Intake/Output Summary (Last 24 hours) at 01/04/2025 1058 Last data filed at 01/04/2025 0523 Gross per 24 hour  Intake --  Output 0 ml  Net 0 ml   There were no vitals filed for this visit.  Examination: General: well appearing, no distress HENT: anicteric sclera, well injected conjunctivae, oral and nasal mucosa normal Lungs: crackles in right upper lung field, has good tidaling, Grade I intermittent air leak on chest tube at -10 cmH2O Cardiovascular: RRR, no murmurs Abdomen: soft, non-tender Extremities: no edema Neuro: Alert and oriented x 3 GU: deferred  Resolved problem list   Assessment and Plan   Secondary Spontaneous Pneumothorax with chest tube management First occurrence with successful replacement chest tube placement. X-ray shows improvement. Grade I intermittent air leak present on - 10 suction. Repeat CXR this evening shows almost complete resolution. -  Place chest tube on water seal - Obtain CXR in the AM - Incentive spirometry   Chronic pulmonary mycobacterial infection (MAC) Chronic MAC managed with antibiotics for ten years. Recent exacerbation treated wit prednisone , and antibiotics. No acute infection signs. - FU sputum cultures - Agree with levofloxacin  for 5 days - Aggressive pulmonary toilet with bronchodilators, mucolytics, and chest PT - Continue home MAC treatments  Patient Lines/Drains/Airways  Status     Active Line/Drains/Airways     Name Placement date Placement time Site Days   Peripheral IV 01/03/25 18 G 1 Right Antecubital 01/03/25  0949  Antecubital  1   Chest Tube 1 Lateral;Right Pleural 14 Fr. 01/04/25  0610  Pleural  less than 1             Labs   CBC: Recent Labs  Lab 01/03/25 0955 01/04/25 0337  WBC 15.0* 9.1  NEUTROABS 12.3*  --   HGB 13.5 11.2*  HCT 41.0 33.5*  MCV 87.6 86.3  PLT 277 216    Basic Metabolic Panel: Recent Labs  Lab 01/03/25 0955 01/04/25 0337  NA 135 132*  K 3.9 4.5  CL 100 97*  CO2 24 23  GLUCOSE 139* 132*  BUN 12 14  CREATININE 0.68 0.66  CALCIUM  9.2 8.6*   GFR: CrCl cannot be calculated (Unknown ideal weight.). Recent Labs  Lab 01/03/25 0955 01/04/25 0337  WBC 15.0* 9.1    Liver Function Tests: Recent Labs  Lab 01/03/25 0955 01/04/25 0337  AST 44* 37  ALT 30 25  ALKPHOS 134* 107  BILITOT 0.5 0.4  PROT 7.4 6.4*  ALBUMIN 3.8 3.4*   No results for input(s): LIPASE, AMYLASE in the last 168 hours. No results for input(s): AMMONIA in the last 168 hours.  ABG No results found for: PHART, PCO2ART, PO2ART, HCO3, TCO2, ACIDBASEDEF, O2SAT   Coagulation Profile: No results for input(s): INR, PROTIME in the last 168 hours.  Cardiac Enzymes: No results for input(s): CKTOTAL, CKMB, CKMBINDEX, TROPONINI in the last 168 hours.  HbA1C: No results found for: HGBA1C  CBG: No results for input(s): GLUCAP in the last 168 hours.  Review of Systems:   Not obtained  Past Medical History:  She,  has a past medical history of Allergic rhinitis, cause unspecified, Bronchiectasis without acute exacerbation (HCC), Cough, Mycobacterium avium infection (HCC) (06/02/2023), and Sinusitis.   Surgical History:   Past Surgical History:  Procedure Laterality Date   APPENDECTOMY     CATARACT EXTRACTION  08/2013   CHOLECYSTECTOMY     TUBAL LIGATION     VESICOVAGINAL FISTULA CLOSURE W/ TAH       Social History:   reports that she quit smoking about 39 years ago. Her smoking use included cigarettes. She started smoking about 59 years ago. She has a 20 pack-year smoking history. She has never used smokeless tobacco. She reports that she does  not currently use alcohol  after a past usage of about 1.0 standard drink of alcohol  per week. She reports that she does not use drugs.   Family History:  Her family history includes Clotting disorder in her mother; Emphysema in her sister; Stroke in her mother.   Allergies Allergies[1]   Home Medications  Prior to Admission medications  Medication Sig Start Date End Date Taking? Authorizing Provider  acyclovir  (ZOVIRAX ) 400 MG tablet Take 400 mg by mouth 2 (two) times daily.   Yes [provider]  albuterol  (VENTOLIN  HFA) 108 (90 Base) MCG/ACT inhaler Inhale 2 puffs into the lungs every 6 (six) hours as needed for wheezing or shortness of breath. 04/13/24  Yes Hunsucker, Donnice SAUNDERS, MD  alendronate (FOSAMAX) 70 MG tablet Take 70 mg by mouth once a week. 01/25/23  Yes [provider]  B Complex Vitamins (B COMPLEX-B12 PO) Take 1 tablet by mouth daily.   Yes [provider]  benzonatate  (TESSALON ) 200 MG capsule Take 1 capsule (200 mg  total) by mouth 3 (three) times daily as needed for cough. 11/02/23  Yes Hunsucker, Donnice SAUNDERS, MD  Brensocatib  (BRINSUPRI ) 10 MG TABS Take 1 tablet by mouth 1 time daily with or without food for bronchiectasis. 11/07/24  Yes Hunsucker, Donnice SAUNDERS, MD  budesonide -glycopyrrolate -formoterol  (BREZTRI  AEROSPHERE) 160-9-4.8 MCG/ACT AERO inhaler Inhale 2 puffs into the lungs in the morning and at bedtime. 05/10/24  Yes Hunsucker, Donnice SAUNDERS, MD  calcium  carbonate (OSCAL) 1500 (600 Ca) MG TABS tablet Take 600 mg of elemental calcium  by mouth daily with breakfast.   Yes [provider]  Cholecalciferol  (VITAMIN D3 PO) Take 4,000 Units by mouth daily.   Yes [provider]  CLOFAZIMINE  PO Take 100 mg by mouth in the morning and at bedtime.   Yes [provider]  escitalopram  (LEXAPRO ) 10 MG tablet Take 10 mg by mouth at bedtime. 12/19/24  Yes [provider]  ethambutol  (MYAMBUTOL ) 400 MG tablet Take 2 tablets (800 mg  total) by mouth daily. Patient taking differently: Take 800 mg by mouth at bedtime. 06/27/24  Yes Fleeta Rothman, Jomarie SAILOR, MD  Flaxseed, Linseed, (FLAX SEED OIL) 1300 MG CAPS Take 1 tablet by mouth daily.   Yes [provider]  ipratropium (ATROVENT) 0.06 % nasal spray Place 2 sprays into both nostrils 4 (four) times daily as needed for rhinitis. 04/03/24  Yes [provider]  levofloxacin  (LEVAQUIN ) 750 MG tablet Take 750 mg by mouth daily. 01/01/25  Yes [provider]  melatonin 5 MG TABS Take 5 mg by mouth at bedtime.   Yes [provider]  mirtazapine (REMERON) 15 MG tablet Take 15 mg by mouth at bedtime. 02/13/24 01/03/25 Yes [provider]  mometasone  (NASONEX ) 50 MCG/ACT nasal spray Place 2 sprays into the nose 2 (two) times daily. 11/08/23  Yes Soldatova, [place], MD  NUZYRA  150 MG TABS Take 150 mg by mouth 2 (two) times daily.   Yes [provider]  predniSONE  (DELTASONE ) 10 MG tablet Take 10 mg by mouth See admin instructions. TAKE SIX TABLETS BY MOUTH DAILY FOR 3 DAYS THEN FOUR TABLETS DAILY FOR 3 DAYS THEN TWO TABLETS DAILY FOR 3 DAYS THEN ONE TABLET DAILY FOR 3 DAYS. START TUESDAY WITH BREAKFAST 01/01/25  Yes [provider]  Probiotic Product (PROBIOTIC DAILY PO) Take 1 capsule by mouth daily.   Yes [provider]  rifabutin  (MYCOBUTIN ) 150 MG capsule Take 300 mg by mouth at bedtime. 03/03/24  Yes [provider]  sodium chloride  HYPERTONIC 3 % nebulizer solution Take 4 mLs by nebulization as needed. 01/07/24 01/10/25 Yes [provider]  TURMERIC PO Take 2,000 mg by mouth daily.    Yes [provider]  atorvastatin  (LIPITOR) 40 MG tablet Take 40 mg by mouth at bedtime. Patient not taking: Reported on 01/03/2025 12/15/24   [provider]  omeprazole  (PRILOSEC) 40 MG capsule Take 1 capsule (40 mg total) by mouth daily. Patient not taking: Reported on 01/03/2025 12/19/24   Hunsucker, Donnice SAUNDERS, MD      Thank you for this interesting consult. I have spent 35 minutes evaluating patient, reviewing chart, and discussing plan of care with patient, family, and primary medical team. If you have any questions or concerns please reach out to me via secure chat.  Paula Southerly, MD [facility]             [1]  Allergies Allergen Reactions   Oxycodone Hcl Other (See Comments)    Ileus  09/2021, tolerated tramadol  after this   "

## 2025-01-04 NOTE — Progress Notes (Addendum)
 IMTS Cross Cover Note  Received page from rapid response RN about chest tube dislodged. Chest tube removed and PCCM aware. Patient is SOB. Denies chest pain. A&O.    Exam: Vitals:   01/03/25 2158 01/04/25 0023  BP:  128/65  Pulse:  80  Resp:    Temp:    SpO2: 98% 100%    General: awake, laying in bed, tachypnea, otherwise NAD Cardio: RRR Pulm: on HFNC 8L saturating at 100%, tachypnea Neuro: alert and oriented   Plan: - Appreciate PCCM assistance - Continue HFNC - Continue pulse ox monitoring  - Close monitoring by RN and rapid response RN - Plan repeat CXR @ 0600 - Given 1 x Toradol  (patient declines tramadol  or other opioids)   Please contact the on-call pager at [phone] or [phone]

## 2025-01-04 NOTE — Progress Notes (Signed)
 OT Cancellation Note  Patient Details Name: SHAYA REDDICK MRN: [id number] DOB: [birth year]   Cancelled Treatment:    Reason Eval/Treat Not Completed: Other (comment).  With PT  Skylen Danielsen D Aayliah Rotenberry 01/04/2025, 2:43 PM 01/04/2025  RP, OTR/L  Acute Rehabilitation Services  Office:  [phone]

## 2025-01-04 NOTE — Progress Notes (Signed)
 PCCM Brief Note  Called by rapid response RN due to chest tube dislodgement. Floor RN noted dressing to be flapping with respirations. Rapid called and on their exam, chest tube almost all the way out with 2 ports outside of the skin.  Chest tube pulled out completely and CXR obtained. CXR demonstrates basilar and apical PTX much smaller then prior to chest tube.  Pt currently asymptomatic and denies dyspnea, chest pain. Vitals stable but O2 had been increased from 2L to 8L. I advised to place her on 100% NRB and have floor RN keep a close ey on her as well as rapid check in again in a few hours. She will be on continuous pulse ox and she has a repeat CXR already ordered for 0600. She might require new chest tube prior to that if she has a change in clinical status or new symptoms. Otherwise we will manage conservatively for now and follow the AM CXR at 0600 before deciding on whether a new tube should be placed during the day or not.  Rapid RN aware to notify me immediately if any changes.   Sammi Gore, PA - [facility] For pager details, please see AMION or use Epic chat  After 1900, please call [facility] for cross coverage needs 01/04/2025, 1:03 AM

## 2025-01-04 NOTE — Significant Event (Addendum)
 Rapid Response Event Note   Reason for Call :  CT site dressing bulging with inspiration.  Initial Focused Assessment:  Pt lying in bed with eyes open. She is SOB. She is AO, denies CP but does c/o of SOB. Lungs sound decreased on R, clear on the L. Skin warm and dry. CT dressing bulging with inspiration. Site taken down. Visible chest tube side holes seen outside of skin and sucking sound heard. Palpable SQ air present are site. CT removed the rest of the way.   HR-80, BP-128/65, RR-28, SpO2-100% on 4L [place].    Interventions:  CT removed the rest of the way PCXR-1. Residual right basilar and apical pneumothorax, slightly increased in size. 2. Extensive bilateral airspace disease. 3. New subcutaneous emphysema in the right chest wall. HFNC-8L Bedside pulse ox PCXR at 0600  Plan of Care:  Continuous pulse ox. PCXR at 0600 to evaluate ptx. Monitor pt closely. Please call RRT if further assistance needed.   Event Summary:   MD Notified: Meade, PCCM PA, Drs Elicia and Azadegan, IMTS MDs Call [phone] Arrival Time:0000 End Time:0100   Update: 0544-PTX on PCXR worse. Meade, PCCM PA notified. R CT reinserted. PCXR show resolving PTX. Plan: CT to -10 suction as tolerated. Per PA Meade, can place to water seal if suction not tolerated. NRB weaned to 4L HFNC-may continue to wean oxygen as SpO2 and WOB allow.  Please call RRT if further assistance needed.     Tish Graeme Piety, RN

## 2025-01-05 ENCOUNTER — Inpatient Hospital Stay (HOSPITAL_COMMUNITY)

## 2025-01-05 DIAGNOSIS — A310 Pulmonary mycobacterial infection: Secondary | ICD-10-CM | POA: Diagnosis not present

## 2025-01-05 DIAGNOSIS — J9383 Other pneumothorax: Secondary | ICD-10-CM | POA: Diagnosis not present

## 2025-01-05 DIAGNOSIS — J479 Bronchiectasis, uncomplicated: Secondary | ICD-10-CM | POA: Diagnosis not present

## 2025-01-05 DIAGNOSIS — J9312 Secondary spontaneous pneumothorax: Secondary | ICD-10-CM | POA: Diagnosis not present

## 2025-01-05 DIAGNOSIS — E875 Hyperkalemia: Secondary | ICD-10-CM

## 2025-01-05 DIAGNOSIS — J9601 Acute respiratory failure with hypoxia: Secondary | ICD-10-CM | POA: Diagnosis not present

## 2025-01-05 LAB — CBC
HCT: 36.4 % (ref 36.0–46.0)
Hemoglobin: 12.1 g/dL (ref 12.0–15.0)
MCH: 28.7 pg (ref 26.0–34.0)
MCHC: 33.2 g/dL (ref 30.0–36.0)
MCV: 86.5 fL (ref 80.0–100.0)
Platelets: 223 K/uL (ref 150–400)
RBC: 4.21 MIL/uL (ref 3.87–5.11)
RDW: 15.9 % — ABNORMAL HIGH (ref 11.5–15.5)
WBC: 9.1 K/uL (ref 4.0–10.5)
nRBC: 0 % (ref 0.0–0.2)

## 2025-01-05 LAB — BASIC METABOLIC PANEL WITH GFR
Anion gap: 7 (ref 5–15)
BUN: 17 mg/dL (ref 8–23)
CO2: 26 mmol/L (ref 22–32)
Calcium: 8.7 mg/dL — ABNORMAL LOW (ref 8.9–10.3)
Chloride: 103 mmol/L (ref 98–111)
Creatinine, Ser: 0.73 mg/dL (ref 0.44–1.00)
GFR, Estimated: >60 mL/min
Glucose, Bld: 78 mg/dL (ref 70–99)
Potassium: 5.2 mmol/L — ABNORMAL HIGH (ref 3.5–5.1)
Sodium: 136 mmol/L (ref 135–145)

## 2025-01-05 MED ORDER — CLOFAZIMINE 50 MG PO CAPS - FOR COMPASSIONATE USE: 100.0000 mg | ORAL_CAPSULE | Freq: Every day | ORAL | Status: DC

## 2025-01-05 MED ORDER — CLOFAZIMINE 50 MG PO CAPS - FOR COMPASSIONATE USE
100.0000 mg | ORAL_CAPSULE | Freq: Every day | ORAL | Status: DC
  Administered 2025-01-05 – 2025-01-09 (×5): 100 mg via ORAL
  Filled 2025-01-05 (×9): qty 2

## 2025-01-05 MED ORDER — CLOFAZIMINE 50 MG PO CAPS - FOR COMPASSIONATE USE
100.0000 mg | ORAL_CAPSULE | Freq: Every day | ORAL | Status: DC
  Filled 2025-01-05: qty 2

## 2025-01-05 MED ORDER — POLYETHYLENE GLYCOL 3350 17 G PO PACK: 17.0000 g | PACK | Freq: Every day | ORAL | Status: DC | PRN

## 2025-01-05 MED ORDER — KETOROLAC TROMETHAMINE 15 MG/ML IJ SOLN
15.0000 mg | Freq: Two times a day (BID) | INTRAMUSCULAR | Status: AC | PRN
  Administered 2025-01-05: 15 mg via INTRAVENOUS
  Filled 2025-01-05: qty 1

## 2025-01-05 MED ORDER — LEVOFLOXACIN 750 MG PO TABS
750.0000 mg | ORAL_TABLET | ORAL | Status: AC
  Administered 2025-01-06 – 2025-01-08 (×2): 750 mg via ORAL
  Filled 2025-01-05 (×2): qty 1

## 2025-01-05 MED ORDER — OMADACYCLINE TOSYLATE 150 MG PO TABS
300.0000 mg | ORAL_TABLET | Freq: Every day | ORAL | Status: DC
  Administered 2025-01-05 – 2025-01-09 (×5): 300 mg via ORAL
  Filled 2025-01-05 (×8): qty 2

## 2025-01-05 MED ORDER — SODIUM ZIRCONIUM CYCLOSILICATE 5 G PO PACK
5.0000 g | PACK | Freq: Once | ORAL | Status: AC
  Administered 2025-01-05: 5 g via ORAL
  Filled 2025-01-05: qty 1

## 2025-01-05 MED ORDER — KETOROLAC TROMETHAMINE 15 MG/ML IJ SOLN
15.0000 mg | Freq: Once | INTRAMUSCULAR | Status: AC
  Administered 2025-01-05: 15 mg via INTRAVENOUS
  Filled 2025-01-05: qty 1

## 2025-01-05 NOTE — Evaluation (Signed)
 Occupational Therapy Evaluation Patient Details Name: Kiara Medina MRN: [id number] DOB: [birth year] Today's Date: 01/05/2025   History of Present Illness   Kiara Medina is an 79 year old female admitted 1/21 with right-sided chest pain and acute onset shortness of breath that did not respond to her rescue inhaler. Found to have right spontaneous PTX.  Chest tube placed 1/21.  Chest tube replaced 1/22 as it had dislodged. 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  PTA she lived alone and was very independent with DOE at baseline, but no O2 needs.  Currently on 2L O2 via [place] and needing generalized supervision for in room mobility and ADL completion.  OT will follow in the acute setting, and no HH OT is anticipated, but could be ordered with [facility] PT if needed.       If plan is discharge home, recommend the following:   Assist for transportation     Functional Status Assessment   Patient has had a recent decline in their functional status and demonstrates the ability to make significant improvements in function in a reasonable and predictable amount of time.     Equipment Recommendations   None recommended by OT     Recommendations for Other Services         Precautions/Restrictions   Precautions Precautions: Fall Precaution/Restrictions Comments: Chest tube, O2 Restrictions Weight Bearing Restrictions Per Provider Order: No     Mobility Bed Mobility Overal bed mobility: Independent                  Transfers Overall transfer level: Needs assistance Equipment used: Rolling walker (2 wheels) Transfers: Sit to/from Stand Sit to Stand: Supervision                  Balance Overall balance assessment: Needs assistance Sitting-balance support: No upper extremity supported, Feet supported Sitting balance-Leahy Scale: Good      Standing balance support: Reliant on assistive device for balance Standing balance-Leahy Scale: Fair                             ADL either performed or assessed with clinical judgement   ADL       Grooming: Supervision/safety               Lower Body Dressing: Supervision/safety   Toilet Transfer: Supervision/safety;Rolling walker (2 wheels)                   Vision Patient Visual Report: No change from baseline       Perception Perception: Not tested       Praxis Praxis: Not tested       Pertinent Vitals/Pain Pain Assessment Pain Assessment: Faces Faces Pain Scale: Hurts little more Pain Location: chest Pain Descriptors / Indicators: Aching Pain Intervention(s): Monitored during session, Premedicated before session     Extremity/Trunk Assessment Upper Extremity Assessment Upper Extremity Assessment: Overall WFL for tasks assessed   Lower Extremity Assessment Lower Extremity Assessment: Defer to PT evaluation   Cervical / Trunk Assessment Cervical / Trunk Assessment: Normal   Communication Communication Communication: No apparent difficulties   Cognition Arousal: Alert Behavior During Therapy: WFL for tasks assessed/performed, Anxious Cognition: No apparent impairments  Following commands: Intact       Cueing  General Comments   Cueing Techniques: Verbal cues   VSS on O2   Exercises     Shoulder Instructions      Home Living Family/patient expects to be discharged to:: Private residence Living Arrangements: Alone Available Help at Discharge: Family;Available PRN/intermittently Type of Home: House Home Access: Stairs to enter [employer] of Steps: 2 Entrance Stairs-Rails: Right Home Layout: One level     Bathroom Shower/Tub: Producer, Television/film/video: Handicapped height Bathroom Accessibility: Yes How Accessible: Accessible via walker Home  Equipment: Cane - single point;Rolling Walker (2 wheels);Shower seat - built in;Grab bars - tub/shower;Hand held shower head          Prior Functioning/Environment Prior Level of Function : Independent/Modified Independent;Driving                    OT Problem List: Decreased strength;Decreased activity tolerance;Impaired balance (sitting and/or standing);Pain   OT Treatment/Interventions: Self-care/ADL training;Balance training;Energy conservation;DME and/or AE instruction;Patient/family education;Therapeutic activities      OT Goals(Current goals can be found in the care plan section)   Acute Rehab OT Goals Patient Stated Goal: Return home OT Goal Formulation: With patient Time For Goal Achievement: 01/19/25 Potential to Achieve Goals: Good ADL Goals Pt Will Perform Lower Body Dressing: with modified independence;sit to/from stand Pt Will Transfer to Toilet: with modified independence;ambulating;regular height toilet   OT Frequency:  Min 2X/week    Co-evaluation              AM-PAC OT 6 Clicks Daily Activity     Outcome Measure Help from another person eating meals?: None Help from another person taking care of personal grooming?: A Little Help from another person toileting, which includes using toliet, bedpan, or urinal?: A Little Help from another person bathing (including washing, rinsing, drying)?: A Little Help from another person to put on and taking off regular upper body clothing?: A Little Help from another person to put on and taking off regular lower body clothing?: A Little 6 Click Score: 19   End of Session Equipment Utilized During Treatment: Rolling walker (2 wheels);Oxygen Nurse Communication: Mobility status  Activity Tolerance: Patient tolerated treatment well Patient left: in chair;with call bell/phone within reach;with family/visitor present  OT Visit Diagnosis: Unsteadiness on feet (R26.81)                Time: 9074-9048 OT Time  Calculation (min): 26 min Charges:  OT General Charges $OT Visit: 1 Visit OT Treatments $Self Care/Home Management : 23-37 mins  01/05/2025  RP, OTR/L  Acute Rehabilitation Services  Office:  [phone]   Charlie JONETTA Halsted 01/05/2025, 9:58 AM

## 2025-01-05 NOTE — Progress Notes (Addendum)
 Mobility Specialist: Progress Note   01/05/25 1400  Mobility  Activity Ambulated with assistance  Level of Assistance Standby assist, set-up cues, supervision of patient - no hands on  Assistive Device Front wheel walker  Distance Ambulated (ft) 125 ft  Activity Response Tolerated well  Mobility Referral Yes  Mobility visit 1 Mobility  Mobility Specialist Start Time (ACUTE ONLY) 1433  Mobility Specialist Stop Time (ACUTE ONLY) 1448  Mobility Specialist Time Calculation (min) (ACUTE ONLY) 15 min    Pre Mobility: SpO2 95% RA During Mobility: SpO2 91-93% RA  Pt received in bed, pleasant and agreeable to mobility session. Pt off [place] upon entry, SpO2 95% on RA. SV throughout. Ambulated to the BR then completed hallway ambulation. SpO2 91-93% RA throughout ambulation. No complaints but feeling winded by end of session. Returned to room. Left in bed with all needs met, call bell in reach.   Ileana Lute Mobility Specialist Please contact via SecureChat or Rehab office at [phone]

## 2025-01-05 NOTE — Plan of Care (Signed)

## 2025-01-05 NOTE — Progress Notes (Addendum)
 "  HD#2 SUBJECTIVE:  Patient Summary: Kiara Medina is a 79 y.o. female with a pertinent PMH of multilobular bronchiectasis c/b chronic MDR MAC, asthma, anxiety, depression who presented with acute-onset right-sided CP and dyspnea and was admitted for right secondary spontaneous pneumothorax.  Overnight Events: No acute events overnight.  Interim History: Patient reports some discomfort in her chest from the insertion site. Describes her pain as soreness and rates pain about 5/10. No difficulties breathing at this time and notes she slept well last night. Daughter is at bedside and had some questions regarding what level of care would be necessary for her mother at home. The patient was previously living independently,  with [place] living about 1 hour away in [place]. She states if she has to take more time off work to help care for her mother she would need assistance with filling out FMLA paperwork. Having questions about potential discharge planning in light of the impending snow and ice.  OBJECTIVE:  Vital Signs: Vitals:   01/05/25 0500 01/05/25 0729 01/05/25 0810 01/05/25 1000  BP:   114/65   Pulse:   82   Resp:   18   Temp:      TempSrc:   Oral   SpO2:  97% 100%   Weight:    98 lb 1.7 oz (44.5 kg)  Height: 5' 3 (1.6 m)       Filed Weights   01/05/25 1000  Weight: 98 lb 1.7 oz (44.5 kg)     Intake/Output Summary (Last 24 hours) at 01/05/2025 1111 Last data filed at 01/05/2025 0800 Gross per 24 hour  Intake 1270 ml  Output 505 ml  Net 765 ml   Net IO Since Admission: 1,165 mL [01/05/25 1111]  Physical Exam: Constitutional: Well-appearing elderly female in no acute distress HENT: normocephalic atraumatic, mucous membranes moist Eyes: conjunctiva non-erythematous, PERRL, no scleral icterus Cardiovascular: regular rate and rhythm, no m/r/g Pulmonary/Chest: normal work of breathing on 2L [place], diffuse rales in all lung fields (anterior + posterior); chest tube in place on  right Abdominal: soft, non-tender, non-distended, bowel sounds normal Neurological: alert & oriented x3, moving all extremities equally Skin: warm and dry Extremities: no edema or cyanosis; peripheral pulses intact Psych: normal mood and affect, thought content normal  Patient Lines/Drains/Airways Status     Active Line/Drains/Airways     Name Placement date Placement time Site Days   Peripheral IV 01/04/25 22 G Anterior;Right Forearm 01/04/25  1458  Forearm  1   Chest Tube 1 Lateral;Right Pleural 14 Fr. 01/04/25  0610  Pleural  1            Pertinent labs and imaging:     Latest Ref Rng & Units 01/05/2025    5:57 AM 01/04/2025    3:37 AM 01/03/2025    9:55 AM  CBC  WBC 4.0 - 10.5 K/uL 9.1  9.1  15.0   Hemoglobin 12.0 - 15.0 g/dL 87.8  88.7  86.4   Hematocrit 36.0 - 46.0 % 36.4  33.5  41.0   Platelets 150 - 400 K/uL 223  216  277        Latest Ref Rng & Units 01/05/2025    5:57 AM 01/04/2025    3:37 AM 01/03/2025    9:55 AM  CMP  Glucose 70 - 99 mg/dL 78  867  860   BUN 8 - 23 mg/dL 17  14  12    Creatinine 0.44 - 1.00 mg/dL 9.26  9.33  0.68   Sodium 135 - 145 mmol/L 136  132  135   Potassium 3.5 - 5.1 mmol/L 5.2  4.5  3.9   Chloride 98 - 111 mmol/L 103  97  100   CO2 22 - 32 mmol/L 26  23  24    Calcium  8.9 - 10.3 mg/dL 8.7  8.6  9.2   Total Protein 6.5 - 8.1 g/dL  6.4  7.4   Total Bilirubin 0.0 - 1.2 mg/dL  0.4  0.5   Alkaline Phos 38 - 126 U/L  107  134   AST 15 - 41 U/L  37  44   ALT 0 - 44 U/L  25  30     DG CHEST PORT 1 VIEW Result Date: 01/05/2025 CLINICAL DATA:  Pneumothorax EXAM: PORTABLE CHEST 1 VIEW COMPARISON:  Yesterday FINDINGS: Stable cardiomediastinal silhouette. Right-sided chest tube is again noted. No definite pneumothorax is noted currently. Stable subcutaneous emphysema seen in right supraclavicular and will right lateral chest wall region. Stable cavitary abnormality seen in right upper lobe. Stable diffuse pulmonary findings compared to prior  exam. IMPRESSION: Stable right-sided chest tube. No definite pneumothorax is noted currently. Stable diffuse pulmonary findings. Electronically Signed   By: Lynwood Landy Raddle M.D.   On: 01/05/2025 07:45   DG CHEST PORT 1 VIEW Result Date: 01/04/2025 CLINICAL DATA:  Pneumothorax. EXAM: PORTABLE CHEST 1 VIEW COMPARISON:  01/04/2025 at 6:25 a.m., 01/04/2025 at 5:34 a.m. FINDINGS: Right-sided chest tube unchanged. Previous seen right basilar pneumothorax not visualized on the current exam. Known cavitary process over the right apex unchanged. Patchy reticulonodular and coarse interstitial changes over the lungs which are stable and known to be chronic. Possible new small amount right pleural fluid/basilar atelectasis. Cardiomediastinal silhouette and remainder of the exam is unchanged. IMPRESSION: 1. Right-sided chest tube unchanged. Previously seen right basilar pneumothorax not visualized on the current exam. 2. Possible new small amount right pleural fluid/basilar atelectasis. 3. Stable cavitary process over the right apex and other chronic pulmonary parenchymal changes as described. Electronically Signed   By: Toribio Agreste M.D.   On: 01/04/2025 11:52    ASSESSMENT/PLAN:  Assessment: Principal Problem:   Spontaneous pneumothorax Active Problems:   BRONCHIECTASIS   Mycobacterium avium-intracellulare complex (HCC)   Acute hypoxic respiratory failure (HCC)   Abnormal liver enzymes   Kiara Medina is a 79 y.o. female with a history of multilobular bronchiectasis c/b chronic MDR MAC, asthma, anxiety, depression who presented with acute-onset right-sided CP and dyspnea and was admitted for right secondary spontaneous pneumothorax, now on hospital day 2.  Plan: #Secondary Spontaneous pneumothorax, right #Acute hypoxic respiratory failure #Bronchiectasis with chronic MDR MAC infection Follows with Dr. Annella (Pulmonology) and Dr. Evonne [facility] ID) for MDR MAC and bronchiectasis.  Has never had  negative sputum culture.  Oxygen requirements have decreased down to 2L .  Still having some discomfort regarding chest tube insertion.  Exam showing diffuse inspiratory crackles likely in the setting of her known bronchiectasis.  Repeat CXR this AM without visualization of pneumothorax.  PCCM is following this patient and we appreciate further recommendations.  Anticipate to continue serial chest x-rays for resolution of pneumothorax before clamping tube.  Continuing home medications for MDR MAC.  Do not see a reason to consult ID for her chronic MAC. - Chest tube management per PCCM - Sputum cultures pending - Serial CXRs - Wean oxygen down to room air - Continue home MAC/bronchiectasis meds - Brenscoatib 10 mg daily - Nuzyra  300  mg daily - Ethambutol  400 mg twice daily - Levofloxacin  switched to q 48h  (initiated on 01/03/2025) - Rifabutin  150 mg twice daily - Prednisone  taper - Continue hypertonic saline nebulizer solution as needed - Continue Breztri  inhaler - Continue DuoNeb every 4 hours as needed - Continue Mucinex  600 mg twice daily - Continue Tessalon  capsules 200 mg 3 times daily as needed - Pain regimen:  - Toradol  15 mg IV q12h PRN (caution for AKI)  #Hyperkalemia K elevated this AM at 5.2. Given one time dose of Lokelma , will continue monitoring. - Lokelma  5 g once - Trend BMPs  #Constipation Did not have a bowel movement yesterday but notes she is usually pretty regular with daily BMs from her antibiotics. Will provide PRN bowel regimen. - Miralax /Glycolax  daily PRN  #Abnormal liver enzymes, resolved Alk phos and AST now within normal range.  Previously 134 and 44 on admission, respectively.  Compared to labs from 11/13/2024 all of these have improved.  No endorsement of abdominal pain or other concerning symptoms regarding elevation in liver enzymes.  Chronic Stable Medical Conditions  #Hyperlipidemia - Continue home Lipitor 40 mg daily #Osteoporosis - Continue  cholecalciferol  and calcium  carbonate #GERD - Protonix  40 mg daily while admitted #Depression/anxiety - continue escitalopram  10 mg daily  Best Practice: Diet: Regular IVF: Fluids: None, Rate: None VTE: enoxaparin  (LOVENOX ) injection 40 mg Start: 01/03/25 2200 Code: Full  Disposition planning: Therapy Recs: Pending, DME: pending Family Contact: Heather (daughter), at bedside DISPO: Anticipated discharge destination and timeline pending clinical improvement.  Signature:  Letha Cheadle, MD [facility]  PGY-1 01/05/2025, 11:11 AM  On Call pager [phone]  "

## 2025-01-05 NOTE — Progress Notes (Signed)
 "  NAME:  Kiara Medina, MRN:  [id number], DOB:  Apr 17, 1946, LOS: 2 ADMISSION DATE:  01/03/2025, CONSULTATION DATE:  01/05/25 REFERRING MD:  Trudy, CHIEF COMPLAINT:  pneumothorax   History of Present Illness:  Kiara Medina is an 79 y/o F with a PMH significant for MDR MAC c/b bronchiectasis who presents from home with acute on set pleuritic chest pain and cough found to have a secondary spontaneous R pneumothorax requiring tube thoracostomy. Pulmonology consulted for evaluation and management of the latter.   She experienced a sudden onset of symptoms when she laid down and began coughing, which is unusual for her except in the morning. This was accompanied by severe pain under the right rib cage and difficulty breathing, distinct from her usual shortness of breath that resolves with an inhaler. The patient denied fevers and chills, and reported no known recent exposure to sick contacts.   She has a history of Mycobacterium avium complex (MAC) lung disease for which she has been on treatment for ten years, taking four antibiotics. She has never had a negative sputum culture. She reports night sweats attributed to MAC and a persistent runny nose, which she believes is medication-related. She recently received a Kenalog  shot and started prednisone  and antibiotics after experiencing severe symptoms earlier in the week.   She mentions a dry throat, which she attributes to her current condition, and her cough has been producing clear sputum, whereas it is usually beige or tan  Pertinent  Medical History   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   Sinusitis     Significant Hospital Events: Including procedures, antibiotic start and stop dates in addition to other pertinent events   1/21 --> admit for seconday spontaneous pneumothorax, R chest tube  placed 1/22 --> chest tube dislodged, replaced by night team, remains on - 10 suction 1/23 water seal   Interim History / Subjective:  CXR no definite ptx   Objective    Blood pressure 114/65, pulse 82, temperature 98 F (36.7 C), temperature source Oral, resp. rate 18, height 5' 3 (1.6 m), weight 44.5 kg, SpO2 100%.        Intake/Output Summary (Last 24 hours) at 01/05/2025 1412 Last data filed at 01/05/2025 0800 Gross per 24 hour  Intake 1170 ml  Output 505 ml  Net 665 ml   Filed Weights   01/05/25 1000  Weight: 44.5 kg    Examination: General: wdwn older adult F NAD  HENT:NCAT  Lungs: R chest tube with intermittent air leak on water seal  Cardiovascular: cap refill < 3sec  Abdomen: thin  Extremities:no acute joint deformity  Neuro: AAOx3   Resolved problem list   Assessment and Plan   Secondary Spontaneous Pneumothorax with chest tube management First occurrence with successful replacement chest tube placement. X-ray shows improvement. G P -has an intermittent air leak, risk averse pt and prefer conservative path. Think best course is to cont to water seal, mobilize, repeat AM CXR -if AM CXR still is ok and air leak is small, can make an argument to dc chest tube despite small air leak, vs cont management of chest tube    Chronic MAC P -5d levaquin  -she has brought her home MAC med -- I have asked primary to help clarify what has happened for this to be held  -follow sputum cx  Patient Lines/Drains/Airways Status     Active Line/Drains/Airways     Name Placement date Placement time Site Days   Peripheral IV 01/04/25 22 G Anterior;Right Forearm 01/04/25  1458  Forearm  1   Chest Tube 1 Lateral;Right Pleural 14 Fr. 01/04/25  0610  Pleural  1            Labs   CBC: Recent Labs  Lab 01/03/25 0955 01/04/25 0337 01/05/25 0557  WBC 15.0* 9.1 9.1  NEUTROABS 12.3*  --   --   HGB 13.5 11.2* 12.1  HCT 41.0 33.5* 36.4  MCV 87.6 86.3 86.5  PLT  277 216 223    Basic Metabolic Panel: Recent Labs  Lab 01/03/25 0955 01/04/25 0337 01/05/25 0557  NA 135 132* 136  K 3.9 4.5 5.2*  CL 100 97* 103  CO2 24 23 26   GLUCOSE 139* 132* 78  BUN 12 14 17   CREATININE 0.68 0.66 0.73  CALCIUM  9.2 8.6* 8.7*   GFR: Estimated Creatinine Clearance: 39.4 mL/min (by C-G formula based on SCr of 0.73 mg/dL). Recent Labs  Lab 01/03/25 0955 01/04/25 0337 01/05/25 0557  WBC 15.0* 9.1 9.1    Liver Function Tests: Recent Labs  Lab 01/03/25 0955 01/04/25 0337  AST 44* 37  ALT 30 25  ALKPHOS 134* 107  BILITOT 0.5 0.4  PROT 7.4 6.4*  ALBUMIN 3.8 3.4*   No results for input(s): LIPASE, AMYLASE in the last 168 hours. No results for input(s): AMMONIA in the last 168 hours.  ABG No results found for: PHART, PCO2ART, PO2ART, HCO3, TCO2, ACIDBASEDEF, O2SAT   Coagulation Profile: No results for input(s): INR, PROTIME in the last 168 hours.  Cardiac Enzymes: No results for input(s): CKTOTAL, CKMB, CKMBINDEX, TROPONINI in the last 168 hours.  HbA1C: No results found for: HGBA1C  CBG: No results for input(s): GLUCAP in the last 168 hours.    Ronnald Gave MSN, AGACNP-BC [facility] Amion for pager  01/05/2025, 2:12 PM      "

## 2025-01-05 NOTE — Discharge Instructions (Addendum)
 To Dagoberto JONELLE Cooks or their caretakers,  You were recently admitted to [facility] for a collapse of your lung called a pneumothorax. There was extra air in side the lining of your lung which prevented it from fully expanding and leading to your shortness of breath. Unfortunately this is a known complication from your other lung conditions such as bronchiectasis. Your condition was treated with a tube inserted into your chest to remove the extra air.  We have not made any medication changes. Please take your home medications with the following instructions: please take the prednisone  that your primary care provider prescribed (10 mg for 3 days).  You should seek further medical care if you experience worsening chest pain, difficulty breathing, or unrelenting cough with sputum production.  Please follow up with the following doctors/specialties: Pulmonology (Dr. Annella) - you have an appointment at 10:30 AM on 01/30/2025 [facility] - you have an appointment at 3:45 PM on 01/22/25 with Dr. Fleeta Rothman [facility] Infectious diseases - please schedule an appointment with Dr. Evonne  We recommend that you also see your primary care doctor in about a week to make sure that you continue to improve. We are so glad that you are feeling better.  Sincerely,  [facility]

## 2025-01-06 ENCOUNTER — Inpatient Hospital Stay (HOSPITAL_COMMUNITY)

## 2025-01-06 DIAGNOSIS — J479 Bronchiectasis, uncomplicated: Secondary | ICD-10-CM

## 2025-01-06 DIAGNOSIS — E785 Hyperlipidemia, unspecified: Secondary | ICD-10-CM

## 2025-01-06 DIAGNOSIS — A310 Pulmonary mycobacterial infection: Secondary | ICD-10-CM

## 2025-01-06 DIAGNOSIS — J9601 Acute respiratory failure with hypoxia: Secondary | ICD-10-CM

## 2025-01-06 DIAGNOSIS — M810 Age-related osteoporosis without current pathological fracture: Secondary | ICD-10-CM

## 2025-01-06 DIAGNOSIS — F419 Anxiety disorder, unspecified: Secondary | ICD-10-CM | POA: Diagnosis not present

## 2025-01-06 DIAGNOSIS — K219 Gastro-esophageal reflux disease without esophagitis: Secondary | ICD-10-CM

## 2025-01-06 DIAGNOSIS — J9312 Secondary spontaneous pneumothorax: Secondary | ICD-10-CM

## 2025-01-06 DIAGNOSIS — J9383 Other pneumothorax: Secondary | ICD-10-CM | POA: Diagnosis not present

## 2025-01-06 DIAGNOSIS — F32A Depression, unspecified: Secondary | ICD-10-CM

## 2025-01-06 DIAGNOSIS — K5900 Constipation, unspecified: Secondary | ICD-10-CM | POA: Diagnosis not present

## 2025-01-06 LAB — BASIC METABOLIC PANEL WITH GFR
Anion gap: 9 (ref 5–15)
BUN: 20 mg/dL (ref 8–23)
CO2: 23 mmol/L (ref 22–32)
Calcium: 8.4 mg/dL — ABNORMAL LOW (ref 8.9–10.3)
Chloride: 103 mmol/L (ref 98–111)
Creatinine, Ser: 0.58 mg/dL (ref 0.44–1.00)
GFR, Estimated: >60 mL/min
Glucose, Bld: 76 mg/dL (ref 70–99)
Potassium: 3.9 mmol/L (ref 3.5–5.1)
Sodium: 134 mmol/L — ABNORMAL LOW (ref 135–145)

## 2025-01-06 LAB — CBC
HCT: 36 % (ref 36.0–46.0)
Hemoglobin: 12.1 g/dL (ref 12.0–15.0)
MCH: 28.8 pg (ref 26.0–34.0)
MCHC: 33.6 g/dL (ref 30.0–36.0)
MCV: 85.7 fL (ref 80.0–100.0)
Platelets: 217 10*3/uL (ref 150–400)
RBC: 4.2 MIL/uL (ref 3.87–5.11)
RDW: 15.8 % — ABNORMAL HIGH (ref 11.5–15.5)
WBC: 8.4 10*3/uL (ref 4.0–10.5)
nRBC: 0 % (ref 0.0–0.2)

## 2025-01-06 MED ORDER — KETOROLAC TROMETHAMINE 15 MG/ML IJ SOLN: 15.0000 mg | Freq: Three times a day (TID) | INTRAMUSCULAR | Status: DC | PRN

## 2025-01-06 MED ORDER — POLYETHYLENE GLYCOL 3350 17 G PO PACK
17.0000 g | PACK | Freq: Once | ORAL | Status: DC
  Filled 2025-01-06: qty 1

## 2025-01-06 MED ORDER — SENNA 8.6 MG PO TABS: 1.0000 | ORAL_TABLET | Freq: Every day | ORAL | Status: DC

## 2025-01-06 MED ORDER — RISAQUAD PO CAPS
2.0000 | ORAL_CAPSULE | Freq: Two times a day (BID) | ORAL | Status: DC
  Administered 2025-01-06 – 2025-01-10 (×9): 2 via ORAL
  Filled 2025-01-06 (×9): qty 2

## 2025-01-06 MED ORDER — HYDROXYZINE HCL 10 MG PO TABS
10.0000 mg | ORAL_TABLET | Freq: Once | ORAL | Status: AC
  Administered 2025-01-06: 10 mg via ORAL
  Filled 2025-01-06: qty 1

## 2025-01-06 MED ORDER — PROCHLORPERAZINE EDISYLATE 10 MG/2ML IJ SOLN
5.0000 mg | Freq: Once | INTRAMUSCULAR | Status: AC
  Administered 2025-01-06: 5 mg via INTRAVENOUS
  Filled 2025-01-06: qty 2

## 2025-01-06 MED ORDER — SENNA 8.6 MG PO TABS: 1.0000 | ORAL_TABLET | Freq: Every day | ORAL | Status: DC | PRN

## 2025-01-06 MED ORDER — KETOROLAC TROMETHAMINE 15 MG/ML IJ SOLN
15.0000 mg | Freq: Once | INTRAMUSCULAR | Status: AC
  Administered 2025-01-06: 15 mg via INTRAVENOUS
  Filled 2025-01-06: qty 1

## 2025-01-06 NOTE — Progress Notes (Signed)
 Patient complained of sudden severe headache which is behind right eye.  Rates as 9/10.  NIH is 0.  Patient has no history of migraines.  VS stable.  MD made aware.  Order received for IV compazine .  Medication given.  Report given to oncoming RN.  Suzen Ice RN

## 2025-01-06 NOTE — Consult Note (Signed)
 "  NAME:  Kiara Medina, MRN:  [id number], DOB:  10-Dec-1946, LOS: 3 ADMISSION DATE:  01/03/2025, CONSULTATION DATE:  01/04/2024 REFERRING MD:  Dr. 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 Annella with pulmonary and [facility] for infectious disease. Not on home oxygen. Normal state of health until Monday, 1/19 when she begin having shortness of breath. Overnight with increased shortness of breath. On EMS arrival oxygen saturation 90% on room air. Placed on 4L nasal cannula. Presented to ED on 1/21. CXR with large right pneumothorax. Pulmonary Consulted.   Pertinent  Medical History  MDR MAC, Bronchiectasis, HLD   Significant Hospital Events: Including procedures, antibiotic start and stop dates in addition to other pertinent events   1/21 Presents to ER with large right pneumothorax   Interim History / Subjective:    1/24 - c/o some congestion in chest and colored sputum but sputum volume and color are all baseline.  Some pleuritic pain as well. STil to water seal. Rt cvhest tube tidals and bubbles when coughed  Objective    Blood pressure 133/60, pulse 78, temperature 97.7 F (36.5 C), resp. rate 16, height 5' 3 (1.6 m), weight 44.5 kg, SpO2 98%.        Intake/Output Summary (Last 24 hours) at 01/06/2025 1624 Last data filed at 01/06/2025 1410 Gross per 24 hour  Intake 820 ml  Output 0 ml  Net 820 ml   Filed Weights   01/05/25 1000  Weight: 44.5 kg      General Appearance:  Cachectic. Thin. Sitting and chatting with family. Looks well Head:  Normocephalic, without obvious abnormality, atraumatic Eyes:  PERRL - yes, conjunctiva/corneas - muddy     Ears:  Normal external ear canals, both ears Nose:  G tube - no Throat:  ETT TUBE - no , OG tube - no Neck:  Supple,  No enlargement/tenderness/nodules Lungs: Equal air  entry. Right side less noisy Heart:  S1 and S2 normal, no murmur, CVP - no.  Pressors - no Abdomen:  Soft, no masses, no organomegaly Genitalia / Rectal:  Not done Extremities:  Extremities- intact Skin:  ntact in exposed areas . Sacral area - not examind Neurologic:  Sedation - none -> RASS - +! SABRA Moves all 4s - yes. CAM-ICU - neg . Orientation - x3+    LABS    PULMONARY No results for input(s): PHART, PCO2ART, PO2ART, HCO3, TCO2, O2SAT in the last 168 hours.  Invalid input(s): PCO2, PO2  CBC Recent Labs  Lab 01/04/25 0337 01/05/25 0557 01/06/25 0631  HGB 11.2* 12.1 12.1  HCT 33.5* 36.4 36.0  WBC 9.1 9.1 8.4  PLT 216 223 217    COAGULATION No results for input(s): INR in the last 168 hours.  CARDIAC  No results for input(s): TROPONINI in the last 168 hours. Recent Labs  Lab 01/03/25 0955  PROBNP 413.0*     CHEMISTRY Recent Labs  Lab 01/03/25 0955 01/04/25 0337 01/05/25 0557 01/06/25 0631  NA 135 132* 136 134*  K 3.9 4.5 5.2* 3.9  CL 100 97* 103 103  CO2 24 23 26 23   GLUCOSE 139* 132* 78 76  BUN 12 14 17 20   CREATININE 0.68 0.66 0.73 0.58  CALCIUM  9.2 8.6* 8.7* 8.4*   Estimated Creatinine Clearance: 39.4 mL/min (by C-G formula based on SCr of 0.58 mg/dL).  LIVER Recent Labs  Lab 01/03/25 0955 01/04/25 0337  AST 44* 37  ALT 30 25  ALKPHOS 134* 107  BILITOT 0.5 0.4  PROT 7.4 6.4*  ALBUMIN 3.8 3.4*     INFECTIOUS No results for input(s): LATICACIDVEN, PROCALCITON in the last 168 hours.   ENDOCRINE CBG (last 3)  No results for input(s): GLUCAP in the last 72 hours.       IMAGING x48h  - image(s) personally visualized  -   highlighted in bold DG CHEST PORT 1 VIEW Result Date: 01/06/2025 EXAM: 1 VIEW(S) XRAY OF THE CHEST 01/06/2025 09:01:00 AM COMPARISON: 01/05/2025 CLINICAL HISTORY: Pneumothorax. FINDINGS: LINES, TUBES AND DEVICES: Right pigtail pleural drainage catheter unchanged. LUNGS AND PLEURA:  Cavitary lesion in right upper lung unchanged. Continued cavitary process at the right lung apex. Probable small residual pneumothorax at the right lung base. Moderate rightward shift of the trachea favoring volume loss on the right. Continued bilateral interstitial accentuation and patchy nodular airspace opacities in both lungs. No pleural effusion. HEART AND MEDIASTINUM: No acute abnormality of the cardiac and mediastinal silhouettes. BONES AND SOFT TISSUES: Stable subcutaneous gas. No acute osseous abnormality. IMPRESSION: 1. Small residual pneumothorax at the right lung base with right pigtail pleural drainage catheter in place, unchanged. 2. Stable subcutaneous emphysema. 3. Cavitary lesion at the right lung apex, unchanged. 4. Bilateral interstitial accentuation and patchy nodular airspace opacities in both lungs. 5. Moderate rightward shift of the trachea, favoring right-sided volume loss. Electronically signed by: Ryan Salvage MD 01/06/2025 03:51 PM EST RP Workstation: [id number]   DG CHEST PORT 1 VIEW Result Date: 01/05/2025 CLINICAL DATA:  Pneumothorax EXAM: PORTABLE CHEST 1 VIEW COMPARISON:  Yesterday FINDINGS: Stable cardiomediastinal silhouette. Right-sided chest tube is again noted. No definite pneumothorax is noted currently. Stable subcutaneous emphysema seen in right supraclavicular and will right lateral chest wall region. Stable cavitary abnormality seen in right upper lobe. Stable diffuse pulmonary findings compared to prior exam. IMPRESSION: Stable right-sided chest tube. No definite pneumothorax is noted currently. Stable diffuse pulmonary findings. Electronically Signed   By: Lynwood Landy Raddle M.D.   On: 01/05/2025 07:45     Assessment and Plan   Acute Hypoxic Respiratory Failure in setting of large right pneumothorax, most likely in setting of MAC with cavitieiss - secpnmdary spontaneous ptx sp Chest tube 01/03/25  1/24 - sill with air leak - tube in place. 3 days  Plan  -  appply suctioin to chest tube - prepare for talc v doxy pleurodesis (doubt surgical candidate given age 9, severe cavities   Acute bronchitis  Plan  -sputum culture  - omadacycle  Hx MDR MAC Plan  - Continue quad therapy  - Continue home inhalers and nebulizer  Acute Pain  Plan  - Toradol  x 1 now  - Tramadol  PRN     SIGNATURE    Dr. Dorethia Cave, M.D., F.C.C.P,  Pulmonary and Critical Care Medicine Staff Physician, [facility] Center Director - Interstitial Lung Disease  Program  [facility] at [facility] [place], [place], [address]   Pager: [phone], If no answer  -> Check AMION or Try [phone] Telephone (clinical office): [phone] Telephone (research): [phone]  4:41 PM 01/06/2025  "

## 2025-01-06 NOTE — Progress Notes (Signed)
 Physical Therapy Treatment Patient Details Name: Kiara Medina MRN: [id number] DOB: 06-Nov-1946 Today's Date: 01/06/2025   History of Present Illness ROONEY GLADWIN is an 79 year old female admitted 1/21 with right-sided chest pain and acute onset shortness of breath that did not respond to her rescue inhaler. Found to have right spontaneous PTX.  Chest tube placed 1/21.  Chest tube replaced 1/22 as it had dislodged. PMH: multilobular bronchiectasis complicated by chronic MDR MAI infection, asthma, and anxiety/depression    PT Comments  Pt demonstrates improvements in activity tolerance and O2 saturation during functional mobility. O2 monitored throughout and ranges 91-95% on RA. Pt is independent with bed mobility and benefits from supervision for STS and ambulation due to fatigue. Pt tolerates multiple bouts of modified distance ambulation in the hall without seated rest break. Seated exercises completed at end of session. Bending and twisting activities minimized due to increased pain at chest tube site with mobility. Chest tube site visualized and no concerns noted. Pt would benefit from continued PT services focused on progressing gait, balance, strength, and overall activity tolerance to promote safety and independence with functional mobility.    If plan is discharge home, recommend the following: Assistance with cooking/housework;Assist for transportation;Help with stairs or ramp for entrance;A little help with walking and/or transfers   Can travel by private vehicle        Equipment Recommendations  Other (comment) (? home O2 and pulse oximeter)    Recommendations for Other Services       Precautions / Restrictions Precautions Precautions: Fall;Other (comment) Recall of Precautions/Restrictions: Intact Precaution/Restrictions Comments: Chest tube, O2 Restrictions Weight Bearing Restrictions Per Provider Order: No     Mobility  Bed Mobility Overal bed mobility: Independent              General bed mobility comments: No difficulty or concerns with bed mobility. Some discomfort at chest tube site with bending and twisting.    Transfers Overall transfer level: Needs assistance Equipment used: Rolling walker (2 wheels) Transfers: Sit to/from Stand Sit to Stand: Supervision           General transfer comment: Demon[employer] good mechanics to complete STS from EOB and recliner. Steady in standing with walker.    Ambulation/Gait Ambulation/Gait assistance: Supervision Gait Distance (Feet): 80 Feet (80'x4) Assistive device: Rolling walker (2 wheels) Gait Pattern/deviations: Step-through pattern, Decreased stride length, Trunk flexed   Gait velocity interpretation: 1.31 - 2.62 ft/sec, indicative of limited community ambulator   General Gait Details: Assist to manage chest tube. O2 monitored throughout. Pt steady with walker, tolerating mulitple modified laps around the unit with walker. Cues needed to safely navigate elevated surface in doorway in hallway. Reports increased pain at chest tube site with activity.   Stairs             Wheelchair Mobility     Tilt Bed    Modified Rankin (Stroke Patients Only)       Balance Overall balance assessment: Needs assistance Sitting-balance support: No upper extremity supported, Feet supported Sitting balance-Leahy Scale: Good Sitting balance - Comments: No sitting balance concerns noted.   Standing balance support: Bilateral upper extremity supported, During functional activity, Reliant on assistive device for balance Standing balance-Leahy Scale: Poor Standing balance comment: Pt relies on UE support from walker.                            Communication Communication Communication: No apparent difficulties  Cognition  Arousal: Alert Behavior During Therapy: WFL for tasks assessed/performed   PT - Cognitive impairments: No apparent impairments                          Following commands: Intact      Cueing Cueing Techniques: Verbal cues, Tactile cues, Visual cues  Exercises General Exercises - Upper Extremity Elbow Flexion: AROM, Both, 10 reps, Seated Elbow Extension: AROM, Both, 10 reps, Seated General Exercises - Lower Extremity Ankle Circles/Pumps: AROM, Both, 10 reps, Seated Long Arc Quad: AROM, Both, 10 reps, Seated Hip ABduction/ADduction: AROM, Both, 10 reps, Seated Other Exercises Other Exercises: Shoulder shrugs 1x10 Other Exercises: Forward punches 1x10    General Comments General comments (skin integrity, edema, etc.): Pt on RA upon arrival. O2 sats 91-95% throughout ambulation and seated exercises.      Pertinent Vitals/Pain Pain Assessment Pain Assessment: 0-10 Pain Score: 6  Pain Location: Chest tube site Pain Descriptors / Indicators: Throbbing Pain Intervention(s): Limited activity within patient's tolerance, Repositioned, Monitored during session    Home Living                          Prior Function            PT Goals (current goals can now be found in the care plan section) Acute Rehab PT Goals Patient Stated Goal: to go home PT Goal Formulation: With patient Time For Goal Achievement: 01/18/25 Potential to Achieve Goals: Good Progress towards PT goals: Progressing toward goals    Frequency    Min 2X/week      PT Plan      Co-evaluation              AM-PAC PT 6 Clicks Mobility   Outcome Measure  Help needed turning from your back to your side while in a flat bed without using bedrails?: None Help needed moving from lying on your back to sitting on the side of a flat bed without using bedrails?: None Help needed moving to and from a bed to a chair (including a wheelchair)?: A Little Help needed standing up from a chair using your arms (e.g., wheelchair or bedside chair)?: A Little Help needed to walk in hospital room?: A Little Help needed climbing 3-5 steps with a railing? : A  Lot 6 Click Score: 19    End of Session Equipment Utilized During Treatment: Oxygen Activity Tolerance: Patient limited by fatigue Patient left: with call bell/phone within reach;in chair (Family on the way) Nurse Communication: Mobility status PT Visit Diagnosis: Muscle weakness (generalized) (M62.81)     Time: 1350-1409 PT Time Calculation (min) (ACUTE ONLY): 19 min  Charges:    $Gait Training: 8-22 mins PT General Charges $$ ACUTE PT VISIT: 1 Visit                     Sabra Morel, PT, DPT  Acute Rehabilitation Services         Office: [phone]      Sabra MARLA Morel 01/06/2025, 3:56 PM

## 2025-01-06 NOTE — Progress Notes (Addendum)
 "  HD#3 SUBJECTIVE:  Patient Summary: Kiara Medina is a 79 y.o. female with a pertinent PMH of multilobular bronchiectasis c/b chronic MDR MAC, asthma, anxiety, depression who presented with acute-onset right-sided CP and dyspnea and was admitted for right secondary spontaneous pneumothorax.  Overnight Events: No acute events overnight.  Received IV Compazine  for headache.  Interim History:  Seen on rounds this morning.  Says headache is better. Denies chest pain or SOB. Last bowel movement 1/21.  OBJECTIVE:  Vital Signs: Vitals:   01/05/25 2105 01/06/25 0414 01/06/25 0815 01/06/25 0844  BP: (!) 144/61 (!) 141/59 133/60   Pulse: 87 67 80 78  Resp: (!) 21 16 16 16   Temp: 97.7 F (36.5 C) 97.9 F (36.6 C) 97.7 F (36.5 C)   TempSrc: Oral Oral    SpO2: 96%  97% 98%  Weight:      Height:        Filed Weights   01/05/25 1000  Weight: 44.5 kg     Intake/Output Summary (Last 24 hours) at 01/06/2025 9050 Last data filed at 01/06/2025 0815 Gross per 24 hour  Intake 700 ml  Output 0 ml  Net 700 ml   Net IO Since Admission: 1,865 mL [01/06/25 0949]  Physical Exam: Constitutional: Well-appearing elderly female in no acute distress HENT: normocephalic atraumatic, mucous membranes moist Eyes: conjunctiva non-erythematous, PERRL, no scleral icterus Cardiovascular: regular rate and rhythm, no m/r/g Pulmonary/Chest: normal work of breathing on 2L [place], diffuse rales in all lung fields (anterior + posterior); chest tube in place on right Abdominal: soft, non-tender, non-distended, bowel sounds normal Neurological: alert & oriented x3, moving all extremities equally Skin: warm and dry Extremities: no edema or cyanosis; peripheral pulses intact Psych: normal mood and affect, thought content normal  Patient Lines/Drains/Airways Status     Active Line/Drains/Airways     Name Placement date Placement time Site Days   Peripheral IV 01/04/25 22 G Anterior;Right Forearm 01/04/25  1458   Forearm  2   Chest Tube 1 Lateral;Right Pleural 14 Fr. 01/04/25  0610  Pleural  2            Pertinent labs and imaging:     Latest Ref Rng & Units 01/06/2025    6:31 AM 01/05/2025    5:57 AM 01/04/2025    3:37 AM  CBC  WBC 4.0 - 10.5 K/uL 8.4  9.1  9.1   Hemoglobin 12.0 - 15.0 g/dL 87.8  87.8  88.7   Hematocrit 36.0 - 46.0 % 36.0  36.4  33.5   Platelets 150 - 400 K/uL 217  223  216        Latest Ref Rng & Units 01/06/2025    6:31 AM 01/05/2025    5:57 AM 01/04/2025    3:37 AM  CMP  Glucose 70 - 99 mg/dL 76  78  867   BUN 8 - 23 mg/dL 20  17  14    Creatinine 0.44 - 1.00 mg/dL 9.41  9.26  9.33   Sodium 135 - 145 mmol/L 134  136  132   Potassium 3.5 - 5.1 mmol/L 3.9  5.2  4.5   Chloride 98 - 111 mmol/L 103  103  97   CO2 22 - 32 mmol/L 23  26  23    Calcium  8.9 - 10.3 mg/dL 8.4  8.7  8.6   Total Protein 6.5 - 8.1 g/dL   6.4   Total Bilirubin 0.0 - 1.2 mg/dL   0.4  Alkaline Phos 38 - 126 U/L   107   AST 15 - 41 U/L   37   ALT 0 - 44 U/L   25     No results found.   ASSESSMENT/PLAN:  Assessment: Principal Problem:   Spontaneous pneumothorax Active Problems:   BRONCHIECTASIS   Mycobacterium avium-intracellulare complex (HCC)   Acute hypoxic respiratory failure (HCC)   Abnormal liver enzymes   Kiara Medina is a 79 y.o. female with a history of multilobular bronchiectasis c/b chronic MDR MAC, asthma, anxiety, depression who presented with acute-onset right-sided CP and dyspnea and was admitted for right secondary spontaneous pneumothorax, now on hospital day 3.  Plan: #Secondary Spontaneous pneumothorax, right #Acute hypoxic respiratory failure #Bronchiectasis with chronic MDR MAC infection Follows with Dr. Annella (Pulmonology) and Dr. Evonne [facility] ID) for MDR MAC and bronchiectasis; has never had a negative sputum culture.  Today, still on 2 L supplemental oxygen. Chest tube to water seal with intermittent air leak. Pulmonology following; repeat chest X-ray  planned today with anticipated tube clamping. - Sputum cultures pending - Serial CXRs; wean oxygen as tolerated. - Continue home MAC/bronchiectasis meds - Brenscoatib 10 mg daily - Nuzyra  300 mg daily - Ethambutol  400 mg twice daily - Levofloxacin  switched to q 48h  (initiated on 01/03/2025) - Rifabutin  150 mg twice daily - Prednisone  taper - Continue hypertonic saline nebulizer solution PRN,  - Continue Breztri  inhaler - Continue DuoNeb every 4hrs PRN, Mucinex  600 mg BID. - Continue Tessalon  capsules 200 mg 3 times daily as needed - Pain regimen:  - Toradol  15 mg IV q12h PRN (caution for AKI)  #Hyperkalemia, resolved K4.9. -BMP as above.  #Constipation No bowel movement since 1/21. -Schedule daily MiraLAX  - Senna as needed.  #Abnormal liver enzymes, resolved -Stable.  Chronic Stable Medical Conditions #Hyperlipidemia - Continue home Lipitor 40 mg daily #Osteoporosis - Continue cholecalciferol  and calcium  carbonate #GERD - Protonix  40 mg daily while admitted #Depression/anxiety - continue escitalopram  10 mg daily  Best Practice: Diet: Regular IVF: Fluids: None, Rate: None VTE: enoxaparin  (LOVENOX ) injection 40 mg Start: 01/03/25 2200 Code: Full  Disposition planning: Therapy Recs: Pending, DME: pending Family Contact: Heather (daughter), not at bedside. DISPO: Anticipated discharge destination and timeline pending clinical improvement.  Signature:  Cozetta Seif, M.D.  Internal Medicine Resident, PGY-2  9:49 AM, 01/06/2025  "

## 2025-01-06 NOTE — Progress Notes (Signed)
"   patient refused to take her Rifabutin  and Ethambutol  this morning. Patient stated that she takes it in the evening. I sent Pharmacy a message requesting for them to retime med. MD notified. "

## 2025-01-06 NOTE — Plan of Care (Signed)

## 2025-01-07 ENCOUNTER — Inpatient Hospital Stay (HOSPITAL_COMMUNITY)

## 2025-01-07 DIAGNOSIS — A310 Pulmonary mycobacterial infection: Secondary | ICD-10-CM | POA: Diagnosis not present

## 2025-01-07 DIAGNOSIS — J9601 Acute respiratory failure with hypoxia: Secondary | ICD-10-CM | POA: Diagnosis not present

## 2025-01-07 DIAGNOSIS — J479 Bronchiectasis, uncomplicated: Secondary | ICD-10-CM | POA: Diagnosis not present

## 2025-01-07 DIAGNOSIS — J9312 Secondary spontaneous pneumothorax: Secondary | ICD-10-CM | POA: Diagnosis not present

## 2025-01-07 LAB — CBC
HCT: 36.7 % (ref 36.0–46.0)
Hemoglobin: 12.2 g/dL (ref 12.0–15.0)
MCH: 28.8 pg (ref 26.0–34.0)
MCHC: 33.2 g/dL (ref 30.0–36.0)
MCV: 86.6 fL (ref 80.0–100.0)
Platelets: 209 10*3/uL (ref 150–400)
RBC: 4.24 MIL/uL (ref 3.87–5.11)
RDW: 15.9 % — ABNORMAL HIGH (ref 11.5–15.5)
WBC: 7.9 10*3/uL (ref 4.0–10.5)
nRBC: 0 % (ref 0.0–0.2)

## 2025-01-07 LAB — BASIC METABOLIC PANEL WITH GFR
Anion gap: 8 (ref 5–15)
BUN: 15 mg/dL (ref 8–23)
CO2: 26 mmol/L (ref 22–32)
Calcium: 8.4 mg/dL — ABNORMAL LOW (ref 8.9–10.3)
Chloride: 103 mmol/L (ref 98–111)
Creatinine, Ser: 0.64 mg/dL (ref 0.44–1.00)
GFR, Estimated: >60 mL/min
Glucose, Bld: 79 mg/dL (ref 70–99)
Potassium: 4.1 mmol/L (ref 3.5–5.1)
Sodium: 137 mmol/L (ref 135–145)

## 2025-01-07 MED ORDER — HYDROXYZINE HCL 10 MG PO TABS
10.0000 mg | ORAL_TABLET | Freq: Three times a day (TID) | ORAL | Status: DC | PRN
  Administered 2025-01-07 – 2025-01-09 (×2): 10 mg via ORAL
  Filled 2025-01-07 (×2): qty 1

## 2025-01-07 MED ORDER — KETOROLAC TROMETHAMINE 15 MG/ML IJ SOLN
15.0000 mg | Freq: Two times a day (BID) | INTRAMUSCULAR | Status: DC | PRN
  Administered 2025-01-07: 15 mg via INTRAVENOUS
  Filled 2025-01-07: qty 1

## 2025-01-07 NOTE — Progress Notes (Signed)
 "  HD#4 SUBJECTIVE:  Patient Summary: Kiara Medina is a 79 y.o. female with a pertinent PMH of multilobular bronchiectasis c/b chronic MDR MAC, asthma, anxiety, depression who presented with acute-onset right-sided CP and dyspnea and was admitted for right secondary spontaneous pneumothorax.  Overnight Events: Received toradol  for pain and atarax  for anxiety last night.  Interim History: Patient eating breakfast this morning, no current acute complaints. Mentioned that she did have some pain last night which improved with Toradol . Started feeling nervous because of potential pleurodesis procedure today so received Atarax  which helped her. Denies any current shortness of breath, mentions that she took off her supplemental O2 last night.  OBJECTIVE:  Vital Signs: Vitals:   01/06/25 0844 01/06/25 2020 01/06/25 2037 01/07/25 0600  BP:  (!) 168/71  (!) 159/61  Pulse: 78 88  84  Resp: 16 (!) 21    Temp:  97.9 F (36.6 C)  98 F (36.7 C)  TempSrc:  Oral  Oral  SpO2: 98% 99% 99% 96%  Weight:      Height:        Filed Weights   01/05/25 1000  Weight: 44.5 kg     Intake/Output Summary (Last 24 hours) at 01/07/2025 9376 Last data filed at 01/06/2025 1410 Gross per 24 hour  Intake 340 ml  Output 0 ml  Net 340 ml   Net IO Since Admission: 1,985 mL [01/07/25 0623]  Physical Exam: Constitutional: Well-appearing elderly female in no acute distress HENT: normocephalic atraumatic, mucous membranes moist Eyes: conjunctiva non-erythematous, PERRL, no scleral icterus Cardiovascular: mild tachycardia with regular rhythm, no m/r/g Pulmonary/Chest: normal work of breathing on RA, diffuse rales in all lung fields (anterior + posterior); chest tube in place on right Abdominal: soft, non-tender, non-distended, bowel sounds normal Neurological: alert & oriented x3, moving all extremities equally Skin: warm and dry Extremities: no edema or cyanosis; peripheral pulses intact Psych: normal mood and  affect, thought content normal  Patient Lines/Drains/Airways Status     Active Line/Drains/Airways     Name Placement date Placement time Site Days   Peripheral IV 01/04/25 22 G Anterior;Right Forearm 01/04/25  1458  Forearm  3   Chest Tube 1 Lateral;Right Pleural 14 Fr. 01/04/25  0610  Pleural  3            Pertinent labs and imaging:     Latest Ref Rng & Units 01/06/2025    6:31 AM 01/05/2025    5:57 AM 01/04/2025    3:37 AM  CBC  WBC 4.0 - 10.5 K/uL 8.4  9.1  9.1   Hemoglobin 12.0 - 15.0 g/dL 87.8  87.8  88.7   Hematocrit 36.0 - 46.0 % 36.0  36.4  33.5   Platelets 150 - 400 K/uL 217  223  216        Latest Ref Rng & Units 01/06/2025    6:31 AM 01/05/2025    5:57 AM 01/04/2025    3:37 AM  CMP  Glucose 70 - 99 mg/dL 76  78  867   BUN 8 - 23 mg/dL 20  17  14    Creatinine 0.44 - 1.00 mg/dL 9.41  9.26  9.33   Sodium 135 - 145 mmol/L 134  136  132   Potassium 3.5 - 5.1 mmol/L 3.9  5.2  4.5   Chloride 98 - 111 mmol/L 103  103  97   CO2 22 - 32 mmol/L 23  26  23    Calcium  8.9 - 10.3  mg/dL 8.4  8.7  8.6   Total Protein 6.5 - 8.1 g/dL   6.4   Total Bilirubin 0.0 - 1.2 mg/dL   0.4   Alkaline Phos 38 - 126 U/L   107   AST 15 - 41 U/L   37   ALT 0 - 44 U/L   25     DG CHEST PORT 1 VIEW Result Date: 01/06/2025 EXAM: 1 VIEW(S) XRAY OF THE CHEST 01/06/2025 09:01:00 AM COMPARISON: 01/05/2025 CLINICAL HISTORY: Pneumothorax. FINDINGS: LINES, TUBES AND DEVICES: Right pigtail pleural drainage catheter unchanged. LUNGS AND PLEURA: Cavitary lesion in right upper lung unchanged. Continued cavitary process at the right lung apex. Probable small residual pneumothorax at the right lung base. Moderate rightward shift of the trachea favoring volume loss on the right. Continued bilateral interstitial accentuation and patchy nodular airspace opacities in both lungs. No pleural effusion. HEART AND MEDIASTINUM: No acute abnormality of the cardiac and mediastinal silhouettes. BONES AND SOFT TISSUES:  Stable subcutaneous gas. No acute osseous abnormality. IMPRESSION: 1. Small residual pneumothorax at the right lung base with right pigtail pleural drainage catheter in place, unchanged. 2. Stable subcutaneous emphysema. 3. Cavitary lesion at the right lung apex, unchanged. 4. Bilateral interstitial accentuation and patchy nodular airspace opacities in both lungs. 5. Moderate rightward shift of the trachea, favoring right-sided volume loss. Electronically signed by: Ryan Salvage MD 01/06/2025 03:51 PM EST RP Workstation: [id number]    ASSESSMENT/PLAN:  Assessment: Principal Problem:   Spontaneous pneumothorax Active Problems:   BRONCHIECTASIS   Mycobacterium avium-intracellulare complex (HCC)   Acute hypoxic respiratory failure (HCC)   Abnormal liver enzymes   Kiara Medina is a 79 y.o. female with a history of multilobular bronchiectasis c/b chronic MDR MAC, asthma, anxiety, depression who presented with acute-onset right-sided CP and dyspnea and was admitted for right secondary spontaneous pneumothorax, now on hospital day 4.  Plan: #Secondary Spontaneous pneumothorax, right #Acute hypoxic respiratory failure #Bronchiectasis with chronic MDR MAC infection Follows with Dr. Annella (Pulmonology) and Dr. Evonne [facility]) for MDR MAC and bronchiectasis.  Has never had negative sputum culture.  Back on room air without any supplemental O2.  Pain well managed with Toradol  with normal Cr.  Exam showing diffuse inspiratory crackles likely in the setting of her known bronchiectasis.  Repeat CXR this AM showing unchanged residual component of PTX.  PCCM is following this patient and we appreciate further recommendations.  Per PCCM anticipate pleurodesis procedure today.  Continuing home medications for MDR MAC. - Chest tube management/Pleurodesis per PCCM - Sputum cultures pending - Serial CXRs - Continue home MAC/bronchiectasis meds - Brenscoatib 10 mg daily - Nuzyra  300 mg daily -  Ethambutol  400 mg twice daily - Levofloxacin  switched to q 48h  (initiated on 01/03/2025) - Rifabutin  150 mg twice daily - Prednisone  taper - Continue hypertonic saline nebulizer solution as needed - Continue Breztri  inhaler - Continue DuoNeb every 4 hours as needed - Continue Mucinex  600 mg twice daily - Continue Tessalon  capsules 200 mg 3 times daily as needed - Pain regimen:  - Toradol  15 mg IV q12h PRN (caution for AKI)  #Hyperkalemia, resolved K normalized after Lokelma  5 g previously. Will continue trending kidney function d/t Toradol  and prior hyperkalemia. - Trend BMPs  #Anxiety Acute anxiety in the setting of planned procedures. Will continue monitoring and offer prn Atarax . - Hydroxyzine  10 mg tid PRN - Continue home escitalopram  10 mg daily  #Constipation 1 charted BM during this admission. She is usually pretty regular  with daily BMs from her antibiotics. Will provide PRN bowel regimen. - Miralax /Glycolax  daily PRN  #Abnormal liver enzymes, resolved Alk phos and AST now within normal range.  Previously 134 and 44 on admission, respectively.  Compared to labs from 11/13/2024 all of these have improved.  No endorsement of abdominal pain or other concerning symptoms regarding elevation in liver enzymes.  Chronic Stable Medical Conditions  #Hyperlipidemia - Continue home Lipitor 40 mg daily #Osteoporosis - Continue cholecalciferol  and calcium  carbonate #GERD - Protonix  40 mg daily while admitted #Depression- continue escitalopram  10 mg daily  Best Practice: Diet: Regular IVF: Fluids: None, Rate: None VTE: enoxaparin  (LOVENOX ) injection 40 mg Start: 01/03/25 2200 Code: Full  Disposition planning: Therapy Recs: HH PT, DME: Home O2 and pulse oximeter (TBD, continuing to monitor sats) Family Contact: Heather (daughter), patient to contact DISPO: Anticipated discharge to home with timeline pending clinical improvement and procedural interventions.   Signature:  Letha Cheadle, MD [facility]  PGY-1 01/07/2025, 6:23 AM  On Call pager [phone]  "

## 2025-01-07 NOTE — Progress Notes (Signed)
 "  NAME:  Kiara Medina, MRN:  [id number], DOB:  [birth year], LOS: 4 ADMISSION DATE:  01/03/2025, CONSULTATION DATE:  01/04/2024 REFERRING MD:  Dr. 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 Annella with pulmonary and [facility] for infectious disease. Not on home oxygen. Normal state of health until Monday, 1/19 when she begin having shortness of breath. Overnight with increased shortness of breath. On EMS arrival oxygen saturation 90% on room air. Placed on 4L nasal cannula. Presented to ED on 1/21. CXR with large right pneumothorax. Pulmonary Consulted.   Pertinent  Medical History  MDR MAC, Bronchiectasis, HLD   Significant Hospital Events: Including procedures, antibiotic start and stop dates in addition to other pertinent events   1/21 Presents to ER with large right pneumothorax  1/24 - c/o some congestion in chest and colored sputum but sputum volume and color are all baseline.  Some pleuritic pain as well. STil to water seal. Rt cvhest tube tidals and bubbles when coughed STARTD SUCTION  Interim History / Subjective:   1/25: Got suction till noon -> Chest CT -> off siction -> TIDALS but NO BUBBLE. Chest CT showes improved but still RT UL ReSIDUAL PTX. She c.p plueiritc pain RT infrascapular area, Nurse says chest tube flushes well   Objective    Blood pressure (!) 150/89, pulse 66, temperature 98.7 F (37.1 C), temperature source Oral, resp. rate 18, height 5' 3 (1.6 m), weight 44.5 kg, SpO2 94%.        Intake/Output Summary (Last 24 hours) at 01/07/2025 1702 Last data filed at 01/07/2025 0600 Gross per 24 hour  Intake 360 ml  Output 350 ml  Net 10 ml   Filed Weights   01/05/25 1000  Weight: 44.5 kg     General: No distress. Sitting and wathcing TV Neuro: Alert and Oriented x 3. GCS 15. Speech normal Psych:  Pleasant Resp:  Barrel Chest - no.  Wheeze - no, Crackles - yes esp Right side, No overt respiratory distress CVS: Normal heart sounds. Murmurs - no Ext: Stigmata of Connective Tissue Disease - no HEENT: Normal upper airway. PEERL +. No post nasal drip CACHECTIC    LABS    PULMONARY No results for input(s): PHART, PCO2ART, PO2ART, HCO3, TCO2, O2SAT in the last 168 hours.  Invalid input(s): PCO2, PO2  CBC Recent Labs  Lab 01/05/25 0557 01/06/25 0631 01/07/25 0632  HGB 12.1 12.1 12.2  HCT 36.4 36.0 36.7  WBC 9.1 8.4 7.9  PLT 223 217 209    COAGULATION No results for input(s): INR in the last 168 hours.  CARDIAC  No results for input(s): TROPONINI in the last 168 hours. Recent Labs  Lab 01/03/25 0955  PROBNP 413.0*     CHEMISTRY Recent Labs  Lab 01/03/25 0955 01/04/25 0337 01/05/25 0557 01/06/25 0631 01/07/25 0632  NA 135 132* 136 134* 137  K 3.9 4.5 5.2* 3.9 4.1  CL 100 97* 103 103 103  CO2 24 23 26 23 26   GLUCOSE 139* 132* 78 76 79  BUN 12 14 17 20 15   CREATININE 0.68 0.66 0.73 0.58 0.64  CALCIUM  9.2 8.6* 8.7* 8.4* 8.4*   Estimated Creatinine Clearance: 39.4 mL/min (by C-G formula based on SCr of 0.64 mg/dL).   LIVER Recent Labs  Lab 01/03/25 0955 01/04/25 0337  AST 44* 37  ALT  30 25  ALKPHOS 134* 107  BILITOT 0.5 0.4  PROT 7.4 6.4*  ALBUMIN 3.8 3.4*     INFECTIOUS No results for input(s): LATICACIDVEN, PROCALCITON in the last 168 hours.   ENDOCRINE CBG (last 3)  No results for input(s): GLUCAP in the last 72 hours.       IMAGING x48h  - image(s) personally visualized  -   highlighted in bold CT CHEST WO CONTRAST Result Date: 01/07/2025 EXAM: CT CHEST WITHOUT CONTRAST 01/07/2025 11:45:36 AM TECHNIQUE: CT of the chest was performed without the administration of intravenous contrast. Multiplanar reformatted images are provided for review. Automated exposure control, iterative reconstruction, and/or weight  based adjustment of the mA/kV was utilized to reduce the radiation dose to as low as reasonably achievable. COMPARISON: Chest radiograph 01/07/2025 and CT chest 01/03/2025. CLINICAL HISTORY: Pneumothorax. FINDINGS: MEDIASTINUM: Heart: Normal heart size. Calcification of the aorta and coronary arteries. No aortic aneurysm. No pericardial effusions. The central airways are clear. The esophagus is decompressed. Thyroid  gland is unremarkable. LYMPH NODES: No significant lymphadenopathy. LUNGS AND PLEURA: Anterior right chest tube in place. Mild to moderate residual right pneumothorax, decreased since previous chest CT. Small right pleural effusion. Large thick-walled cavitary lesion in the right apex measuring up to about 5.5 cm in diameter, unchanged. Numerous nodular infiltrative changes throughout both lungs with areas of cavitation, also greater on the right. Mucous plugging. 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 SOFT TISSUES/BONES: Moderate subcutaneous emphysema in the right chest wall and extending up into the right neck and shoulder region as well as across to the left neck. Degenerative changes in the spine. No acute bony abnormalities. UPPER ABDOMEN: Limited images of the upper abdomen demonstrate no acute abnormality. Prior cholecystectomy. IMPRESSION: 1. Mild to moderate residual right pneumothorax, decreased since previous chest CT, with small right pleural effusion and moderate subcutaneous emphysema in the right chest wall extending into the right neck and shoulder region as well as across to the left neck. 2. Unchanged large thick-walled cavitary lesion in the right apex measuring up to about 5.5 cm, with numerous nodular infiltrative changes throughout both lungs with areas of cavitation, greater on the right, likely representing chronic atypical infectious process, with mucous plugging. Electronically signed by: Elsie Gravely MD 01/07/2025 03:40 PM EST RP Workstation:  [id number]   DG CHEST PORT 1 VIEW Result Date: 01/07/2025 CLINICAL DATA:  Pneumothorax. EXAM: PORTABLE CHEST 1 VIEW COMPARISON:  01/06/2025 FINDINGS: Right pleural drain remains in place. Similar appearance of heterogeneous right upper lobe lung opacity with residual component of pneumothorax, similar to prior. Diffuse interstitial coarsening is again noted bilaterally with peripheral subpleural opacity in the left lung, stable. Cardiopericardial silhouette is at upper limits of normal for size. Soft tissue gas in the right chest wall is similar to prior. Telemetry leads overlie the chest. IMPRESSION: No substantial interval change. Right pleural drain remains in place with residual component of pneumothorax. Electronically Signed   By: Camellia Candle M.D.   On: 01/07/2025 07:07   DG CHEST PORT 1 VIEW Result Date: 01/06/2025 EXAM: 1 VIEW(S) XRAY OF THE CHEST 01/06/2025 09:01:00 AM COMPARISON: 01/05/2025 CLINICAL HISTORY: Pneumothorax. FINDINGS: LINES, TUBES AND DEVICES: Right pigtail pleural drainage catheter unchanged. LUNGS AND PLEURA: Cavitary lesion in right upper lung unchanged. Continued cavitary process at the right lung apex. Probable small residual pneumothorax at the right lung base. Moderate rightward shift of the trachea favoring volume loss on the right. Continued bilateral interstitial accentuation and patchy nodular airspace opacities in both  lungs. No pleural effusion. HEART AND MEDIASTINUM: No acute abnormality of the cardiac and mediastinal silhouettes. BONES AND SOFT TISSUES: Stable subcutaneous gas. No acute osseous abnormality. IMPRESSION: 1. Small residual pneumothorax at the right lung base with right pigtail pleural drainage catheter in place, unchanged. 2. Stable subcutaneous emphysema. 3. Cavitary lesion at the right lung apex, unchanged. 4. Bilateral interstitial accentuation and patchy nodular airspace opacities in both lungs. 5. Moderate rightward shift of the trachea, favoring  right-sided volume loss. Electronically signed by: Ryan Salvage MD 01/06/2025 03:51 PM EST RP Workstation: [id number]     Assessment and Plan   Acute Hypoxic Respiratory Failure in setting of large right pneumothorax, most likely in setting of MAC with cavitieiss - secpnmdary spontaneous ptx sp Chest tube 01/03/25   1/25 - air leak resolved when checked 5h after suction stopped. TIDALS +   Plan  - REAPPLY suctioin to chest tube - She has high risk for recurrence and is approaching end stage lung. Therefore, pleurodesis indicated - prepare for talc v doxy pleurodesis (not a  surgical candidate given age 23, severe cavities -d/w Dr Lucas)   - d/w Dr Rolan Sharps PCCM - he will address 01/08/25 ->   -0 given still residual RUL poicket of air will give 1-2 more days on suction   - cxr 01/08/25    Acute bronchitis  Plan  -sputum culture 01/07/25  - omadacycle  Hx MDR MAC Plan  - Continue quad therapy  - Continue home inhalers and nebulizer  Acute Pain  Plan  - Tramadol  PRN     SIGNATURE    Dr. Dorethia Cave, M.D., F.C.C.P,  Pulmonary and Critical Care Medicine Staff Physician, [facility] Center Director - Interstitial Lung Disease  Program  Pulmonary Fibrosis [facility] at [facility] [place], [place], [address]   Pager: [phone], If no answer  -> Check AMION or Try [phone] Telephone (clinical office): [phone] Telephone (research): [phone]  5:02 PM 01/07/2025  "

## 2025-01-07 NOTE — Plan of Care (Signed)

## 2025-01-08 ENCOUNTER — Inpatient Hospital Stay (HOSPITAL_COMMUNITY)

## 2025-01-08 DIAGNOSIS — M810 Age-related osteoporosis without current pathological fracture: Secondary | ICD-10-CM | POA: Diagnosis not present

## 2025-01-08 DIAGNOSIS — J9312 Secondary spontaneous pneumothorax: Secondary | ICD-10-CM | POA: Diagnosis not present

## 2025-01-08 DIAGNOSIS — K5900 Constipation, unspecified: Secondary | ICD-10-CM | POA: Diagnosis not present

## 2025-01-08 DIAGNOSIS — A310 Pulmonary mycobacterial infection: Secondary | ICD-10-CM | POA: Diagnosis not present

## 2025-01-08 DIAGNOSIS — E785 Hyperlipidemia, unspecified: Secondary | ICD-10-CM | POA: Diagnosis not present

## 2025-01-08 DIAGNOSIS — J9383 Other pneumothorax: Secondary | ICD-10-CM | POA: Diagnosis not present

## 2025-01-08 DIAGNOSIS — F419 Anxiety disorder, unspecified: Secondary | ICD-10-CM | POA: Diagnosis not present

## 2025-01-08 DIAGNOSIS — J479 Bronchiectasis, uncomplicated: Secondary | ICD-10-CM | POA: Diagnosis not present

## 2025-01-08 DIAGNOSIS — J9601 Acute respiratory failure with hypoxia: Secondary | ICD-10-CM | POA: Diagnosis not present

## 2025-01-08 DIAGNOSIS — F32A Depression, unspecified: Secondary | ICD-10-CM | POA: Diagnosis not present

## 2025-01-08 DIAGNOSIS — K219 Gastro-esophageal reflux disease without esophagitis: Secondary | ICD-10-CM | POA: Diagnosis not present

## 2025-01-08 LAB — CBC
HCT: 37.6 % (ref 36.0–46.0)
Hemoglobin: 12.9 g/dL (ref 12.0–15.0)
MCH: 29.5 pg (ref 26.0–34.0)
MCHC: 34.3 g/dL (ref 30.0–36.0)
MCV: 86 fL (ref 80.0–100.0)
Platelets: 242 10*3/uL (ref 150–400)
RBC: 4.37 MIL/uL (ref 3.87–5.11)
RDW: 15.7 % — ABNORMAL HIGH (ref 11.5–15.5)
WBC: 10.1 10*3/uL (ref 4.0–10.5)
nRBC: 0 % (ref 0.0–0.2)

## 2025-01-08 LAB — BASIC METABOLIC PANEL WITH GFR
Anion gap: 9 (ref 5–15)
BUN: 17 mg/dL (ref 8–23)
CO2: 26 mmol/L (ref 22–32)
Calcium: 8.8 mg/dL — ABNORMAL LOW (ref 8.9–10.3)
Chloride: 100 mmol/L (ref 98–111)
Creatinine, Ser: 0.76 mg/dL (ref 0.44–1.00)
GFR, Estimated: >60 mL/min
Glucose, Bld: 92 mg/dL (ref 70–99)
Potassium: 4.1 mmol/L (ref 3.5–5.1)
Sodium: 135 mmol/L (ref 135–145)

## 2025-01-08 MED ORDER — TRAMADOL HCL 50 MG PO TABS
50.0000 mg | ORAL_TABLET | Freq: Four times a day (QID) | ORAL | Status: DC | PRN
  Administered 2025-01-08 – 2025-01-10 (×4): 50 mg via ORAL
  Filled 2025-01-08 (×4): qty 1

## 2025-01-08 MED ORDER — ENSURE PLUS HIGH PROTEIN PO LIQD: 237.0000 mL | Freq: Two times a day (BID) | ORAL | Status: DC

## 2025-01-08 MED ORDER — ACETAMINOPHEN 325 MG PO TABS
975.0000 mg | ORAL_TABLET | Freq: Four times a day (QID) | ORAL | Status: DC
  Administered 2025-01-08 – 2025-01-10 (×5): 975 mg via ORAL
  Filled 2025-01-08 (×7): qty 3

## 2025-01-08 NOTE — Progress Notes (Signed)
 01/08/2025  Seen in f/u for ptx  S: No events, breathing stable.  O:    01/07/2025    8:01 PM 01/07/2025    4:59 PM 01/07/2025    7:55 AM  Vitals with BMI  Systolic 155 150 842  Diastolic 86 89 70  Pulse 87 66 87   No distress 1+ air leak only with cough Thin and weak  A: MAC-associated PTX s/p pigtail with incomplete lung expansion  P: Imaging reviewed: pigtail in fissure; concurrent lung apposition only present at base; for these reasons do not think talc will be successful Would clamp tube, repeat CXR t+4H; if lung stays mostly up remove pigtail; if lung goes down I would place anterior pigtail and talc through this Home O2 screen Patient updated on plan   Rolan Sharps MD Pulmonary Critical Care Medicine [employer] if during day (7a-7p) [phone] if after hours (7p-7a)

## 2025-01-08 NOTE — Progress Notes (Signed)
 01/08/2025 CXR unchanged Remove pigtail AM CXR

## 2025-01-08 NOTE — Progress Notes (Signed)
 "  HD#5 SUBJECTIVE:  Patient Summary: Kiara Medina is a 79 y.o. female with a pertinent PMH of multilobular bronchiectasis c/b chronic MDR MAC, asthma, anxiety, depression who presented with acute-onset right-sided CP and dyspnea and was admitted for right secondary spontaneous pneumothorax.  Overnight Events: No acute events overnight.  Interim History: Patient reports feeling the pain is controlled.  Discussed with her the need for possible multimodal pain regimen as her creatinine is increasing and we would like to avoid Toradol -induced AKI. States that she is not having any trouble breathing while at rest. Does occasionally get winded with exertion but able to catch her breath quickly upon resting. Did notice a few air bubbles in the chest tube suction. Main concern is how painful a potential pleurodesis procedure will be.  OBJECTIVE:  Vital Signs: Vitals:   01/07/25 0600 01/07/25 0755 01/07/25 1659 01/07/25 2001  BP: (!) 159/61 (!) 157/70 (!) 150/89 (!) 155/86  Pulse: 84 87 66 87  Resp:   18 19  Temp: 98 F (36.7 C)  98.7 F (37.1 C) 97.8 F (36.6 C)  TempSrc: Oral  Oral Oral  SpO2: 96% 98% 94%   Weight:      Height:        Filed Weights   01/05/25 1000  Weight: 44.5 kg    No intake or output data in the 24 hours ending 01/08/25 0810  Net IO Since Admission: 1,995 mL [01/08/25 0810]  Physical Exam: Constitutional: Well-appearing elderly female in no acute distress HENT: normocephalic atraumatic, mucous membranes moist Eyes: conjunctiva non-erythematous, PERRL, no scleral icterus Cardiovascular: regular rate and rhythm, no m/r/g Pulmonary/Chest: normal work of breathing on RA, diffuse rales in all lung fields (anterior + posterior); chest tube in place on right Abdominal: soft, non-tender, non-distended, bowel sounds normal Neurological: alert & oriented x3, moving all extremities equally Skin: warm and dry Extremities: no edema or cyanosis; peripheral pulses  intact Psych: normal mood and affect, thought content normal  Patient Lines/Drains/Airways Status     Active Line/Drains/Airways     Name Placement date Placement time Site Days   Peripheral IV 01/04/25 22 G Anterior;Right Forearm 01/04/25  1458  Forearm  4   Chest Tube 1 Lateral;Right Pleural 14 Fr. 01/04/25  0610  Pleural  4            Pertinent labs and imaging:     Latest Ref Rng & Units 01/08/2025    3:01 AM 01/07/2025    6:32 AM 01/06/2025    6:31 AM  CBC  WBC 4.0 - 10.5 K/uL 10.1  7.9  8.4   Hemoglobin 12.0 - 15.0 g/dL 87.0  87.7  87.8   Hematocrit 36.0 - 46.0 % 37.6  36.7  36.0   Platelets 150 - 400 K/uL 242  209  217        Latest Ref Rng & Units 01/08/2025    3:01 AM 01/07/2025    6:32 AM 01/06/2025    6:31 AM  CMP  Glucose 70 - 99 mg/dL 92  79  76   BUN 8 - 23 mg/dL 17  15  20    Creatinine 0.44 - 1.00 mg/dL 9.23  9.35  9.41   Sodium 135 - 145 mmol/L 135  137  134   Potassium 3.5 - 5.1 mmol/L 4.1  4.1  3.9   Chloride 98 - 111 mmol/L 100  103  103   CO2 22 - 32 mmol/L 26  26  23  Calcium  8.9 - 10.3 mg/dL 8.8  8.4  8.4     DG CHEST PORT 1 VIEW Result Date: 01/08/2025 EXAM: 1 VIEW(S) XRAY OF THE CHEST 01/08/2025 06:09:46 AM COMPARISON: 01/07/2025 CLINICAL HISTORY: Pneumothorax. FINDINGS: LINES, TUBES AND DEVICES: Right pigtail catheter stable in place. LUNGS AND PLEURA: Persistent small right pneumothorax, similar in size to prior. Similar bilateral reticulonodular opacities. Similar appearance of right upper lobe cavitary lesion. No pleural effusion. HEART AND MEDIASTINUM: No acute abnormality of the cardiac and mediastinal silhouettes. BONES AND SOFT TISSUES: Similar right chest wall subcutaneous emphysema. No acute osseous abnormality. IMPRESSION: 1. Persistent small right pneumothorax, similar in size to prior study. 2. Similar bilateral reticulonodular opacities and right upper lobe cavitary process. 3. Right pigtail catheter stable in place. 4. Similar right  chest wall subcutaneous emphysema. Electronically signed by: Waddell Calk MD 01/08/2025 06:29 AM EST RP Workstation: [id number]   CT CHEST WO CONTRAST Result Date: 01/07/2025 EXAM: CT CHEST WITHOUT CONTRAST 01/07/2025 11:45:36 AM TECHNIQUE: CT of the chest was performed without the administration of intravenous contrast. Multiplanar reformatted images are provided for review. Automated exposure control, iterative reconstruction, and/or weight based adjustment of the mA/kV was utilized to reduce the radiation dose to as low as reasonably achievable. COMPARISON: Chest radiograph 01/07/2025 and CT chest 01/03/2025. CLINICAL HISTORY: Pneumothorax. FINDINGS: MEDIASTINUM: Heart: Normal heart size. Calcification of the aorta and coronary arteries. No aortic aneurysm. No pericardial effusions. The central airways are clear. The esophagus is decompressed. Thyroid  gland is unremarkable. LYMPH NODES: No significant lymphadenopathy. LUNGS AND PLEURA: Anterior right chest tube in place. Mild to moderate residual right pneumothorax, decreased since previous chest CT. Small right pleural effusion. Large thick-walled cavitary lesion in the right apex measuring up to about 5.5 cm in diameter, unchanged. Numerous nodular infiltrative changes throughout both lungs with areas of cavitation, also greater on the right. Mucous plugging. 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 SOFT TISSUES/BONES: Moderate subcutaneous emphysema in the right chest wall and extending up into the right neck and shoulder region as well as across to the left neck. Degenerative changes in the spine. No acute bony abnormalities. UPPER ABDOMEN: Limited images of the upper abdomen demonstrate no acute abnormality. Prior cholecystectomy. IMPRESSION: 1. Mild to moderate residual right pneumothorax, decreased since previous chest CT, with small right pleural effusion and moderate subcutaneous emphysema in the right chest wall extending into  the right neck and shoulder region as well as across to the left neck. 2. Unchanged large thick-walled cavitary lesion in the right apex measuring up to about 5.5 cm, with numerous nodular infiltrative changes throughout both lungs with areas of cavitation, greater on the right, likely representing chronic atypical infectious process, with mucous plugging. Electronically signed by: Elsie Gravely MD 01/07/2025 03:40 PM EST RP Workstation: [id number]    ASSESSMENT/PLAN:  Assessment: Principal Problem:   Spontaneous pneumothorax Active Problems:   BRONCHIECTASIS   Mycobacterium avium-intracellulare complex (HCC)   Acute hypoxic respiratory failure (HCC)   Abnormal liver enzymes   DEMRI POULTON is a 79 y.o. female with a history of multilobular bronchiectasis c/b chronic MDR MAC, asthma, anxiety, depression who presented with acute-onset right-sided CP and dyspnea and was admitted for right secondary spontaneous pneumothorax, now on hospital day 5.  Plan: #Secondary Spontaneous pneumothorax, right #Acute hypoxic respiratory failure #Bronchiectasis with chronic MDR MAC infection Follows with Dr. Annella (Pulmonology) and Dr. Evonne [facility] ID) for MDR MAC and bronchiectasis.  Has never had negative sputum culture.  Back on room air without  any supplemental O2.  Pain well managed with Toradol  but slow uptrend in Cr, concern that she will develop AKI with repeated Toradol .  Exam grossly unchanged from yesterday.  Repeat CXR this AM showing persistent right pneumothorax.  Chest tube is still in place but has had problems with recurrent leaking.  PCCM is following, recommending to clamp tubing and repeat CXR. If improving will remove chest tube, otherwise may need to replace chest tube anteriorly for optimization of chemical pleurodesis. Continuing home medications for MDR MAC.  Optimizing pain regimen, can consider gabapentin/muscle relaxers if pain not controlled by current regimen. - Chest tube  management/Pleurodesis per PCCM - Sputum cultures pending - Serial CXRs - Continue home MAC/bronchiectasis meds - Brenscoatib 10 mg daily - Nuzyra  300 mg daily - Ethambutol  400 mg twice daily - Levofloxacin  switched to q 48h  (initiated on 01/03/2025) - Rifabutin  150 mg twice daily - Prednisone  taper - Continue hypertonic saline nebulizer solution as needed - Continue Breztri  inhaler - Continue DuoNeb every 4 hours as needed - Continue Mucinex  600 mg twice daily - Continue Tessalon  capsules 200 mg 3 times daily as needed - Pain regimen:  - Scheduled Acetaminophen  975 mg q6h - Tramadol  50 mg PO q6h PRN  - Discontinue Toradol   #Anxiety Acute anxiety in the setting of planned procedures. Will continue monitoring and offer prn Atarax . - Hydroxyzine  10 mg tid PRN - Continue home escitalopram  10 mg daily  #Constipation 1 charted BM during this admission. She is usually pretty regular with daily BMs from her antibiotics. Will provide PRN bowel regimen, though patient did refuse previously ordered MiraLax . - Miralax /Glycolax  daily PRN  #Abnormal liver enzymes, resolved Alk phos and AST now within normal range.  Previously 134 and 44 on admission, respectively.  Compared to labs from 11/13/2024 all of these have improved.  No endorsement of abdominal pain or other concerning symptoms regarding elevation in liver enzymes.  #Hyperkalemia, resolved K normalized after Lokelma  5 g previously.  Chronic Stable Medical Conditions  #Hyperlipidemia - Continue home Lipitor 40 mg daily #Osteoporosis - Continue cholecalciferol  and calcium  carbonate #GERD - Protonix  40 mg daily while admitted #Depression- continue escitalopram  10 mg daily  Best Practice: Diet: Regular IVF: Fluids: None, Rate: None VTE: enoxaparin  (LOVENOX ) injection 40 mg Start: 01/03/25 2200 Code: Full  Disposition planning: Therapy Recs: HH PT, DME: Home O2 and pulse oximeter (TBD, continuing to monitor sats) Family  Contact: Heather (daughter), patient to contact DISPO: Anticipated discharge to home with timeline pending clinical improvement and procedural interventions.   Signature:  Letha Cheadle, MD [facility]  PGY-1 01/08/2025, 8:10 AM  On Call pager [phone]  "

## 2025-01-09 ENCOUNTER — Inpatient Hospital Stay (HOSPITAL_COMMUNITY)

## 2025-01-09 ENCOUNTER — Other Ambulatory Visit (HOSPITAL_COMMUNITY): Payer: Self-pay

## 2025-01-09 ENCOUNTER — Telehealth: Payer: Self-pay | Admitting: Internal Medicine

## 2025-01-09 DIAGNOSIS — J479 Bronchiectasis, uncomplicated: Secondary | ICD-10-CM | POA: Diagnosis not present

## 2025-01-09 DIAGNOSIS — J9601 Acute respiratory failure with hypoxia: Secondary | ICD-10-CM | POA: Diagnosis not present

## 2025-01-09 DIAGNOSIS — F32A Depression, unspecified: Secondary | ICD-10-CM | POA: Diagnosis not present

## 2025-01-09 DIAGNOSIS — E785 Hyperlipidemia, unspecified: Secondary | ICD-10-CM | POA: Diagnosis not present

## 2025-01-09 DIAGNOSIS — F419 Anxiety disorder, unspecified: Secondary | ICD-10-CM | POA: Diagnosis not present

## 2025-01-09 DIAGNOSIS — K5900 Constipation, unspecified: Secondary | ICD-10-CM | POA: Diagnosis not present

## 2025-01-09 DIAGNOSIS — E871 Hypo-osmolality and hyponatremia: Secondary | ICD-10-CM

## 2025-01-09 DIAGNOSIS — J9312 Secondary spontaneous pneumothorax: Secondary | ICD-10-CM | POA: Diagnosis not present

## 2025-01-09 DIAGNOSIS — K219 Gastro-esophageal reflux disease without esophagitis: Secondary | ICD-10-CM | POA: Diagnosis not present

## 2025-01-09 LAB — BASIC METABOLIC PANEL WITH GFR
Anion gap: 10 (ref 5–15)
BUN: 14 mg/dL (ref 8–23)
CO2: 25 mmol/L (ref 22–32)
Calcium: 8.6 mg/dL — ABNORMAL LOW (ref 8.9–10.3)
Chloride: 98 mmol/L (ref 98–111)
Creatinine, Ser: 0.77 mg/dL (ref 0.44–1.00)
GFR, Estimated: >60 mL/min
Glucose, Bld: 97 mg/dL (ref 70–99)
Potassium: 4.1 mmol/L (ref 3.5–5.1)
Sodium: 133 mmol/L — ABNORMAL LOW (ref 135–145)

## 2025-01-09 LAB — CBC
HCT: 37.8 % (ref 36.0–46.0)
Hemoglobin: 12.5 g/dL (ref 12.0–15.0)
MCH: 28.7 pg (ref 26.0–34.0)
MCHC: 33.1 g/dL (ref 30.0–36.0)
MCV: 86.7 fL (ref 80.0–100.0)
Platelets: 221 10*3/uL (ref 150–400)
RBC: 4.36 MIL/uL (ref 3.87–5.11)
RDW: 15.8 % — ABNORMAL HIGH (ref 11.5–15.5)
WBC: 10 10*3/uL (ref 4.0–10.5)
nRBC: 0 % (ref 0.0–0.2)

## 2025-01-09 MED ORDER — ETHAMBUTOL HCL 400 MG PO TABS
800.0000 mg | ORAL_TABLET | Freq: Every day | ORAL | Status: DC
  Filled 2025-01-09: qty 2

## 2025-01-09 MED ORDER — RIFABUTIN 150 MG PO CAPS
300.0000 mg | ORAL_CAPSULE | Freq: Every day | ORAL | Status: DC
  Filled 2025-01-09: qty 2

## 2025-01-09 MED ORDER — GUAIFENESIN ER 600 MG PO TB12
600.0000 mg | ORAL_TABLET | Freq: Two times a day (BID) | ORAL | 0 refills | Status: AC | PRN
  Filled 2025-01-09: qty 60, 30d supply, fill #0

## 2025-01-09 NOTE — Telephone Encounter (Signed)
 Pt scheduled

## 2025-01-09 NOTE — Telephone Encounter (Signed)
 Hi can we move up her f/u appt with MH to mid or late feb.  Hospitalized for secondary PTX.

## 2025-01-09 NOTE — Progress Notes (Signed)
 Mobility Specialist: Progress Note   01/09/25 1400  Mobility  Activity Ambulated with assistance  Level of Assistance Standby assist, set-up cues, supervision of patient - no hands on  Assistive Device Front wheel walker  Distance Ambulated (ft) 200 ft  Activity Response Tolerated well  Mobility Referral Yes  Mobility visit 1 Mobility  Mobility Specialist Start Time (ACUTE ONLY) 1430  Mobility Specialist Stop Time (ACUTE ONLY) 1440  Mobility Specialist Time Calculation (min) (ACUTE ONLY) 10 min    Pt received in bed, agreeable to mobility session. Completed ambulatory sat test (see following note). Sv throughout w/ RW for hallway ambulation. No complaints. SpO2 >90% on RA throughout. Returned to room without fault. Left in bed with all needs met, call bell in reach.   Ileana Lute Mobility Specialist Please contact via SecureChat or Rehab office at [phone]

## 2025-01-09 NOTE — Progress Notes (Signed)
 "  HD#6 SUBJECTIVE:  Patient Summary: Kiara Medina is a 79 y.o. female with a pertinent PMH of multilobular bronchiectasis c/b chronic MDR MAC, asthma, anxiety, depression who presented with acute-onset right-sided CP and dyspnea and was admitted for right secondary spontaneous pneumothorax.  Overnight Events: No acute events overnight.  Interim History: Patient reports pain is well-controlled. She will occasionally get a fleeting pain in her back but it goes away very quickly. No shortness of breath at rest. Does have mild dyspnea on exertion but easily able to recover after resting. Requesting to speak with Pulmonology team about discharge planning.  OBJECTIVE:  Vital Signs: Vitals:   01/08/25 2007 01/08/25 2043 01/09/25 0535 01/09/25 0831  BP:  (!) 144/75 135/75 (!) 143/72  Pulse:  74 89 84  Resp:  18  18  Temp:  98 F (36.7 C) 98.3 F (36.8 C) 97.9 F (36.6 C)  TempSrc:   Oral   SpO2: 96% 97% 97% 99%  Weight:      Height:        Filed Weights   01/05/25 1000  Weight: 44.5 kg    No intake or output data in the 24 hours ending 01/09/25 1449  Net IO Since Admission: 1,455 mL [01/09/25 1449]  Physical Exam: Constitutional: Well-appearing elderly female in no acute distress HENT: normocephalic atraumatic, mucous membranes moist Eyes: conjunctiva non-erythematous, PERRL, no scleral icterus Cardiovascular: regular rate and rhythm, no m/r/g Pulmonary/Chest: normal work of breathing on RA, diffuse rales in all lung fields (anterior + posterior); chest tube in place on right Abdominal: soft, non-tender, non-distended, bowel sounds normal Neurological: alert & oriented x3, moving all extremities equally Skin: warm and dry Extremities: no edema or cyanosis; peripheral pulses intact Psych: normal mood and affect, thought content normal  Patient Lines/Drains/Airways Status     Active Line/Drains/Airways     Name Placement date Placement time Site Days   Peripheral IV  01/04/25 22 G Anterior;Right Forearm 01/04/25  1458  Forearm  5   Chest Tube 1 Lateral;Right Pleural 14 Fr. 01/04/25  0610  Pleural  5            Pertinent labs and imaging:     Latest Ref Rng & Units 01/09/2025   11:21 AM 01/08/2025    3:01 AM 01/07/2025    6:32 AM  CBC  WBC 4.0 - 10.5 K/uL 10.0  10.1  7.9   Hemoglobin 12.0 - 15.0 g/dL 87.4  87.0  87.7   Hematocrit 36.0 - 46.0 % 37.8  37.6  36.7   Platelets 150 - 400 K/uL 221  242  209        Latest Ref Rng & Units 01/09/2025   11:21 AM 01/08/2025    3:01 AM 01/07/2025    6:32 AM  CMP  Glucose 70 - 99 mg/dL 97  92  79   BUN 8 - 23 mg/dL 14  17  15    Creatinine 0.44 - 1.00 mg/dL 9.22  9.23  9.35   Sodium 135 - 145 mmol/L 133  135  137   Potassium 3.5 - 5.1 mmol/L 4.1  4.1  4.1   Chloride 98 - 111 mmol/L 98  100  103   CO2 22 - 32 mmol/L 25  26  26    Calcium  8.9 - 10.3 mg/dL 8.6  8.8  8.4     DG Chest Port 1 View Result Date: 01/09/2025 EXAM: 1 VIEW(S) XRAY OF THE CHEST 01/09/2025 05:40:37 AM COMPARISON: 01/08/2025 CLINICAL  HISTORY: Pneumothorax. FINDINGS: LINES, TUBES AND DEVICES: Stable right pigtail chest tube in place. LUNGS AND PLEURA: Stable small right apical pneumothorax. Stable cavitary lesion in right upper lung. Stable bilateral interstitial opacities and bronchiectasis. No pleural effusion. HEART AND MEDIASTINUM: No acute abnormality of the cardiac and mediastinal silhouettes. BONES AND SOFT TISSUES: Stable right subcutaneous emphysema. No acute osseous abnormality. IMPRESSION: 1. Stable small right apical pneumothorax with right pigtail chest tube in place. 2. Unchanged cavitary process within the right upper lobe and bilateral interstitial and nodular opacities. Electronically signed by: Waddell Calk MD 01/09/2025 07:07 AM EST RP Workstation: [id number]   DG Chest Port 1 View Result Date: 01/08/2025 CLINICAL DATA:  Follow-up pneumothorax EXAM: PORTABLE CHEST 1 VIEW COMPARISON:  01/08/2025, CT 01/07/2025, chest x-ray  10/29/2022 FINDINGS: Right-sided chest tube remains in place. Similar small residual right apicolateral pneumothorax. Right upper lobe cavitary lesion and similar bilateral chronic lung disease with bronchiectasis and nodular opacities/infiltrates. Stable cardiomediastinal silhouette with aortic atherosclerosis. Right chest wall emphysema. IMPRESSION: 1. Right-sided chest tube remains in place with similar small residual right apicolateral pneumothorax. 2. Similar bilateral interstitial and nodular pulmonary opacities/infiltrates and right upper lobe cavitary process Electronically Signed   By: Luke Bun M.D.   On: 01/08/2025 16:39    ASSESSMENT/PLAN:  Assessment: Principal Problem:   Spontaneous pneumothorax Active Problems:   BRONCHIECTASIS   Mycobacterium avium-intracellulare complex (HCC)   Acute hypoxic respiratory failure (HCC)   Abnormal liver enzymes   Kiara Medina is a 79 y.o. female with a history of multilobular bronchiectasis c/b chronic MDR MAC, asthma, anxiety, depression who presented with acute-onset right-sided CP and dyspnea and was admitted for right secondary spontaneous pneumothorax, now on hospital day 6.  Plan: #Secondary Spontaneous pneumothorax, right #Acute hypoxic respiratory failure #Bronchiectasis with chronic MDR MAC infection Follows with Dr. Annella (Pulmonology) and Dr. Evonne [facility] ID) for MDR MAC and bronchiectasis.  Has never had negative sputum culture.  Back on room air without any supplemental O2.  Pain well managed with Tylenol  and Tramadol .  Exam grossly unchanged from yesterday.  Repeat CXR this AM showing persistent right apical PTX, though lung remains well-aerated.  PCCM planning to remove chest tube. She did ambulate well without desaturations while on RA. Plan for discharge tomorrow to ensure safe home environment. Continuing home medications for MDR MAC.  Optimizing pain regimen, can consider gabapentin/muscle relaxers if pain not  controlled by current regimen. - Remove chest tube, no need for serial CXRs per PCCM - Sputum cultures pending - Continue home MAC/bronchiectasis meds - Brenscoatib 10 mg daily - Nuzyra  300 mg daily - Ethambutol  400 mg twice daily - Levofloxacin  switched to q 48h  (initiated on 01/03/2025) - Rifabutin  150 mg twice daily - Prednisone  taper - Continue hypertonic saline nebulizer solution as needed - Continue Breztri  inhaler - Continue DuoNeb every 4 hours as needed - Continue Mucinex  600 mg twice daily - Continue Tessalon  capsules 200 mg 3 times daily as needed - Pain regimen:  - Scheduled Acetaminophen  975 mg q6h - Tramadol  50 mg PO q6h PRN  #Anxiety Acute anxiety in the setting of planned procedures. Will continue monitoring and offer prn Atarax . - Hydroxyzine  10 mg tid PRN - Continue home escitalopram  10 mg daily  #Constipation 1 charted BM during this admission. She is usually pretty regular with daily BMs from her antibiotics. Will provide PRN bowel regimen, though patient did refuse previously ordered MiraLax . - Miralax /Glycolax  daily PRN  #Hyponatremia Na decreased down to 133. Euvolemic. Will  continue to trend.  #Hyperkalemia, resolved K normalized after Lokelma  5 g previously.  #Abnormal liver enzymes, resolved Alk phos and AST now within normal range.  Previously 134 and 44 on admission, respectively.  Compared to labs from 11/13/2024 all of these have improved.  No endorsement of abdominal pain or other concerning symptoms regarding elevation in liver enzymes.  Chronic Stable Medical Conditions  #Hyperlipidemia - Continue home Lipitor 40 mg daily #Osteoporosis - Continue cholecalciferol  and calcium  carbonate #GERD - Protonix  40 mg daily while admitted #Depression- continue escitalopram  10 mg daily  Best Practice: Diet: Regular IVF: Fluids: None, Rate: None VTE: enoxaparin  (LOVENOX ) injection 40 mg Start: 01/03/25 2200 Code: Full  Disposition planning: Therapy  Recs: HH PT, DME: Home O2 and pulse oximeter (TBD, continuing to monitor sats) Family Contact: Heather (daughter), patient to contact DISPO: Anticipated discharge to home with timeline pending clinical improvement and procedural interventions.   Signature:  Letha Cheadle, MD [facility]  PGY-1 01/09/2025, 2:49 PM  On Call pager [phone]  "

## 2025-01-09 NOTE — Plan of Care (Signed)

## 2025-01-09 NOTE — Progress Notes (Signed)
 Physical Therapy Treatment Patient Details Name: Kiara Medina MRN: [id number] DOB: 1946/03/28 Today's Date: 01/09/2025   History of Present Illness Kiara Medina is an 79 year old female admitted 1/21 with right-sided chest pain and acute onset shortness of breath that did not respond to her rescue inhaler. Found to have right spontaneous PTX.  Chest tube placed 1/21.  Chest tube replaced 1/22 as it had dislodged. PMH: multilobular bronchiectasis complicated by chronic MDR MAI infection, asthma, and anxiety/depression    PT Comments  Progressing with ambulation and now walking on RA with SpO2 92% or greater.  Does relate LE fatigue with ambulation.  Able to complete functional tasks without UE support and no LOB.  Daughter present and supportive.  Hopeful for chest tube out today.  Appropriate for HHPT when stable.    If plan is discharge home, recommend the following: Assistance with cooking/housework;Assist for transportation;Help with stairs or ramp for entrance;A little help with walking and/or transfers   Can travel by private vehicle        Equipment Recommendations  None recommended by PT    Recommendations for Other Services       Precautions / Restrictions Precautions Precautions: Fall Recall of Precautions/Restrictions: Intact Precaution/Restrictions Comments: Chest tube     Mobility  Bed Mobility Overal bed mobility: Modified Independent             General bed mobility comments: HOB up, discussed lower HOB and daughter reports able to sleep with HOB flat; felt due to pain from tube wanting HOB up during transitions    Transfers Overall transfer level: Needs assistance Equipment used: Rolling walker (2 wheels) Transfers: Sit to/from Stand Sit to Stand: Supervision           General transfer comment: assist for lines including chest tube    Ambulation/Gait Ambulation/Gait assistance: Supervision Gait Distance (Feet): 200 Feet Assistive device: Rolling  walker (2 wheels) Gait Pattern/deviations: Step-through pattern, Decreased stride length       General Gait Details: in hallway on RA with SpO2 stable, c/o leg fatigue end of ambulation   Stairs             Wheelchair Mobility     Tilt Bed    Modified Rankin (Stroke Patients Only)       Balance Overall balance assessment: Needs assistance   Sitting balance-Leahy Scale: Good     Standing balance support: During functional activity, No upper extremity supported Standing balance-Leahy Scale: Good Standing balance comment: completed toilet hygiene and doff/donning of underwear, washed hands at sink and brushed her teeth                            Communication Communication Communication: No apparent difficulties  Cognition Arousal: Alert Behavior During Therapy: WFL for tasks assessed/performed   PT - Cognitive impairments: No apparent impairments                         Following commands: Intact      Cueing Cueing Techniques: Verbal cues  Exercises      General Comments General comments (skin integrity, edema, etc.): completed walking pulse oximetry with separate note; daughter present and supportive      Pertinent Vitals/Pain Pain Assessment Pain Assessment: Faces Faces Pain Scale: Hurts a little bit Pain Location: Chest tube site more when sitting Pain Descriptors / Indicators: Discomfort Pain Intervention(s): Monitored during session    Home Living  Prior Function            PT Goals (current goals can now be found in the care plan section) Progress towards PT goals: Progressing toward goals    Frequency    Min 2X/week      PT Plan      Co-evaluation              AM-PAC PT 6 Clicks Mobility   Outcome Measure  Help needed turning from your back to your side while in a flat bed without using bedrails?: None Help needed moving from lying on your back to sitting on  the side of a flat bed without using bedrails?: None Help needed moving to and from a bed to a chair (including a wheelchair)?: A Little Help needed standing up from a chair using your arms (e.g., wheelchair or bedside chair)?: A Little Help needed to walk in hospital room?: A Little Help needed climbing 3-5 steps with a railing? : A Little 6 Click Score: 20    End of Session   Activity Tolerance: Patient tolerated treatment well Patient left: in bed;with family/visitor present   PT Visit Diagnosis: Muscle weakness (generalized) (M62.81)     Time: 8697-8672 PT Time Calculation (min) (ACUTE ONLY): 25 min  Charges:    $Gait Training: 8-22 mins $Therapeutic Activity: 8-22 mins PT General Charges $$ ACUTE PT VISIT: 1 Visit                     Micheline Medina, PT Acute Rehabilitation Services Office:[phone] 01/09/2025    Kiara Medina 01/09/2025, 1:57 PM

## 2025-01-09 NOTE — TOC CM/SW Note (Signed)
 Transition of Care [facility]) - Inpatient Brief Assessment   Patient Details  Name: Kiara Medina MRN: [id number] Date of Birth: 09-Jul-1946  Transition of Care [facility]) CM/SW Contact:    Tom-Johnson, Damarrion Mimbs Daphne, RN Phone Number: 01/09/2025, 4:47 PM   Clinical Narrative:  Patient presented to the ED with acute Rt-sided Chest Pain and worsening Shortness of Breath. Admitted with Rt secondary spontaneous Pneumothorax. Rt Chest tube removed today 01/09/25 with no adverse effect. Patient is on room air. Home O2 ordered. Patient did not meet qualification requirement for home O2. Patient states she will f/u with her Pulmonologist outpatient.   Patient lives alone, has two daughters, good neighbors  and one supportive sister. Independent with care and drive self prior admit. Has a cane, walker, shower seat and rails at home.  PCP is Keren Vicenta BRAVO, MD and uses [facility] short term and [facility].  Home health recommended, patient requested [facility], referral accepted via Hub and info on AVS.  CM will continue to follow as patient progresses with care towards discharge.      Transition of Care Asessment: Insurance and Status: Insurance coverage has been reviewed Patient has primary care physician: Yes Home environment has been reviewed: Yes Prior level of function:: Independent Prior/Current Home Services: No current home services Social Drivers of Health Review: SDOH reviewed no interventions necessary Readmission risk has been reviewed: Yes

## 2025-01-09 NOTE — Progress Notes (Signed)
 Occupational Therapy Treatment Patient Details Name: Kiara Medina MRN: [id number] DOB: [birth year] Today's Date: 01/09/2025   History of present illness Kiara Medina is an 79 year old female admitted 1/21 with right-sided chest pain and acute onset shortness of breath that did not respond to her rescue inhaler. Found to have right spontaneous PTX.  Chest tube placed 1/21.  Chest tube replaced 1/22 as it had dislodged. 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 Focus of session on progressing functional mobility and increasing independent engagement in ADL tasks. Pt required up to supervision overall for functional transfer to [facility] and for completion of ADL tasks OOB. OT to continue to follow Pt acutely. Continue per POC.       If plan is discharge home, recommend the following:  Assist for transportation   Equipment Recommendations  None recommended by OT    Recommendations for Other Services      Precautions / Restrictions Precautions Precautions: Fall;Other (comment) Recall of Precautions/Restrictions: Intact Precaution/Restrictions Comments: Chest tube, O2 Restrictions Weight Bearing Restrictions Per Provider Order: No       Mobility Bed Mobility Overal bed mobility: Independent                  Transfers Overall transfer level: Needs assistance   Transfers: Sit to/from Stand, Bed to chair/wheelchair/BSC Sit to Stand: Supervision     Step pivot transfers: Supervision     General transfer comment: Supervision for transfer to [facility] on L side of bed. Min assist for management of chest tube.     Balance Overall balance assessment: Needs assistance Sitting-balance support: No upper extremity supported, Feet supported Sitting balance-Leahy Scale: Normal Sitting balance - Comments: No sitting balance concerns noted.   Standing balance support: No  upper extremity supported, During functional activity Standing balance-Leahy Scale: Fair Standing balance comment: No DME used for transfer to [facility]                           ADL either performed or assessed with clinical judgement   ADL Overall ADL's : Needs assistance/impaired     Grooming: Supervision/safety;Oral care;Sitting               Lower Body Dressing: Supervision/safety   Toilet Transfer: Modified Independent   Toileting- Clothing Manipulation and Hygiene: Supervision/safety              Extremity/Trunk Assessment Upper Extremity Assessment Upper Extremity Assessment: Overall WFL for tasks assessed            Vision       Perception     Praxis     Communication Communication Communication: No apparent difficulties   Cognition Arousal: Alert Behavior During Therapy: WFL for tasks assessed/performed Cognition: No apparent impairments                               Following commands: Intact        Cueing   Cueing Techniques: Verbal cues  Exercises      Shoulder Instructions       General Comments On RA upon arrival, NAD and VSS    Pertinent Vitals/ Pain       Pain Assessment Pain Assessment: No/denies pain  Home Living  Prior Functioning/Environment              Frequency  Min 2X/week        Progress Toward Goals  OT Goals(current goals can now be found in the care plan section)  Progress towards OT goals: Progressing toward goals  Acute Rehab OT Goals Patient Stated Goal: to go home OT Goal Formulation: With patient Time For Goal Achievement: 01/19/25 Potential to Achieve Goals: Good ADL Goals Pt Will Perform Lower Body Dressing: with modified independence;sit to/from stand Pt Will Transfer to Toilet: with modified independence;ambulating;regular height toilet  Plan      Co-evaluation                 AM-PAC OT 6  Clicks Daily Activity     Outcome Measure   Help from another person eating meals?: None Help from another person taking care of personal grooming?: A Little Help from another person toileting, which includes using toliet, bedpan, or urinal?: A Little Help from another person bathing (including washing, rinsing, drying)?: A Little Help from another person to put on and taking off regular upper body clothing?: A Little Help from another person to put on and taking off regular lower body clothing?: A Little 6 Click Score: 19    End of Session    OT Visit Diagnosis: Unsteadiness on feet (R26.81)   Activity Tolerance Patient tolerated treatment well   Patient Left in bed;with call bell/phone within reach   Nurse Communication          Time: 9250-9193 OT Time Calculation (min): 17 min  Charges: OT General Charges $OT Visit: 1 Visit OT Treatments $Self Care/Home Management : 8-22 mins  Maurilio CROME, OTR/L.  [facility] Acute Rehabilitation  Office: [phone]   Maurilio PARAS Mabry Santarelli 01/09/2025, 8:17 AM

## 2025-01-09 NOTE — Progress Notes (Signed)
 SATURATION QUALIFICATIONS: (This note is used to comply with regulatory documentation for home oxygen)  Patient Saturations on Room Air at Rest = 97%  Patient Saturations on Room Air while Ambulating = 92%  Patient Saturations on NA Liters of oxygen while Ambulating = NA%  Please briefly explain why patient needs home oxygen: No home O2 needs, maintaining SpO2 90% or higher on RA with ambulation.  Micheline Portal, PT Acute Rehabilitation Services [phone] 01/09/2025

## 2025-01-09 NOTE — Progress Notes (Signed)
 Nurse requested Mobility Specialist to perform oxygen saturation test with pt which includes removing pt from oxygen both at rest and while ambulating.  Below are the results from that testing.     Patient Saturations on Room Air at Rest = spO2 97%  Patient Saturations on Room Air while Ambulating = sp02 91% .    Patient Saturations on N/A Liters of oxygen while Ambulating = sp02 N/A%  At end of testing pt left in room on RA.  Reported results to nurse.

## 2025-01-09 NOTE — Progress Notes (Addendum)
 01/09/2025 Pigtail not removed for unclear reason. Lung remains up. Fine to remove.  Do not see role for additional CXR at this time. Walking pulse ox prior to DC. Will try to get [facility] to see in 2-4 weeks in clinic  Rolan Sharps MD PCCM

## 2025-01-10 ENCOUNTER — Other Ambulatory Visit (HOSPITAL_COMMUNITY): Payer: Self-pay

## 2025-01-10 DIAGNOSIS — A310 Pulmonary mycobacterial infection: Secondary | ICD-10-CM | POA: Diagnosis not present

## 2025-01-10 DIAGNOSIS — J9312 Secondary spontaneous pneumothorax: Secondary | ICD-10-CM | POA: Diagnosis not present

## 2025-01-10 DIAGNOSIS — J9601 Acute respiratory failure with hypoxia: Secondary | ICD-10-CM | POA: Diagnosis not present

## 2025-01-10 DIAGNOSIS — K219 Gastro-esophageal reflux disease without esophagitis: Secondary | ICD-10-CM | POA: Diagnosis not present

## 2025-01-10 DIAGNOSIS — M810 Age-related osteoporosis without current pathological fracture: Secondary | ICD-10-CM | POA: Diagnosis not present

## 2025-01-10 DIAGNOSIS — E875 Hyperkalemia: Secondary | ICD-10-CM | POA: Diagnosis not present

## 2025-01-10 DIAGNOSIS — J479 Bronchiectasis, uncomplicated: Secondary | ICD-10-CM | POA: Diagnosis not present

## 2025-01-10 DIAGNOSIS — Z79899 Other long term (current) drug therapy: Secondary | ICD-10-CM

## 2025-01-10 DIAGNOSIS — E785 Hyperlipidemia, unspecified: Secondary | ICD-10-CM | POA: Diagnosis not present

## 2025-01-10 DIAGNOSIS — F419 Anxiety disorder, unspecified: Secondary | ICD-10-CM | POA: Diagnosis not present

## 2025-01-10 DIAGNOSIS — K5900 Constipation, unspecified: Secondary | ICD-10-CM | POA: Diagnosis not present

## 2025-01-10 DIAGNOSIS — F32A Depression, unspecified: Secondary | ICD-10-CM | POA: Diagnosis not present

## 2025-01-10 LAB — CBC
HCT: 36.6 % (ref 36.0–46.0)
Hemoglobin: 12.2 g/dL (ref 12.0–15.0)
MCH: 28.8 pg (ref 26.0–34.0)
MCHC: 33.3 g/dL (ref 30.0–36.0)
MCV: 86.5 fL (ref 80.0–100.0)
Platelets: 219 10*3/uL (ref 150–400)
RBC: 4.23 MIL/uL (ref 3.87–5.11)
RDW: 15.9 % — ABNORMAL HIGH (ref 11.5–15.5)
WBC: 9.8 10*3/uL (ref 4.0–10.5)
nRBC: 0 % (ref 0.0–0.2)

## 2025-01-10 LAB — BASIC METABOLIC PANEL WITH GFR
Anion gap: 7 (ref 5–15)
BUN: 16 mg/dL (ref 8–23)
CO2: 27 mmol/L (ref 22–32)
Calcium: 8.7 mg/dL — ABNORMAL LOW (ref 8.9–10.3)
Chloride: 100 mmol/L (ref 98–111)
Creatinine, Ser: 0.76 mg/dL (ref 0.44–1.00)
GFR, Estimated: >60 mL/min
Glucose, Bld: 92 mg/dL (ref 70–99)
Potassium: 4.7 mmol/L (ref 3.5–5.1)
Sodium: 134 mmol/L — ABNORMAL LOW (ref 135–145)

## 2025-01-10 NOTE — TOC Transition Note (Signed)
 Transition of Care [facility]) - Discharge Note   Patient Details  Name: Kiara Medina MRN: [id number] Date of Birth: Feb 08, 1946  Transition of Care [facility]) CM/SW Contact:  Tom-Johnson, Harvest Muskrat, RN Phone Number: 01/10/2025, 11:58 AM   Clinical Narrative:     Patient is scheduled for discharge today.  Readmission Risk Assessment done. Home health info, outpatient f/u, hospital f/u and discharge instructions on AVS. Prescriptions sent to [facility] pharmacy and patient will receive meds prior discharge. Daughter, Powell at bedside and will transport at discharge.  No further ICM needs noted.      Final next level of care: Home w Home Health Services Barriers to Discharge: Barriers Resolved   Patient Goals and CMS Choice Patient states their goals for this hospitalization and ongoing recovery are:: To return home CMS Medicare.gov Compare Post Acute Care list provided to:: Patient Choice offered to / list presented to : Patient      Discharge Placement                Patient to be transferred to facility by: Daughter Name of family member notified: [facility]    Discharge Plan and Services Additional resources added to the After Visit Summary for                  DME Arranged: N/A DME Agency: NA       HH Arranged: PT, Nurse's Aide HH Agency: [facility] Date [facility] Agency Contacted: 01/09/25 Time HH Agency Contacted: [phone]    Social Drivers of Health (SDOH) Interventions SDOH Screenings   Food Insecurity: No Food Insecurity (01/03/2025)  Housing: Low Risk (01/03/2025)  Transportation Needs: No Transportation Needs (01/03/2025)  Utilities: Not At Risk (01/03/2025)  Depression (PHQ2-9): Low Risk (09/06/2023)  Social Connections: Moderately Integrated (01/03/2025)  Tobacco Use: Medium Risk (01/03/2025)     Readmission Risk Interventions    01/09/2025    4:42 PM  Readmission Risk Prevention Plan  Post Dischage Appt Complete  Medication Screening Complete   Transportation Screening Complete

## 2025-01-10 NOTE — Care Management Important Message (Signed)
 Important Message  Patient Details  Name: Kiara Medina MRN: [id number] Date of Birth: [birth year]   Important Message Given:  Yes - Medicare IM     Claretta Deed 01/10/2025, 8:58 AM

## 2025-01-22 ENCOUNTER — Ambulatory Visit: Admitting: Infectious Disease

## 2025-01-30 ENCOUNTER — Inpatient Hospital Stay: Admitting: Pulmonary Disease

## 2025-02-21 ENCOUNTER — Ambulatory Visit: Admitting: Pulmonary Disease
